# Patient Record
Sex: Male | Born: 1955 | Race: White | Hispanic: No | Marital: Married | Smoking: Former smoker
Health system: Southern US, Community
[De-identification: ages and names within clinical notes are randomized; demographics above are authoritative.]

## PROBLEM LIST (undated history)

## (undated) DIAGNOSIS — R0600 Dyspnea, unspecified: Secondary | ICD-10-CM

## (undated) DIAGNOSIS — C801 Malignant (primary) neoplasm, unspecified: Secondary | ICD-10-CM

## (undated) DIAGNOSIS — J449 Chronic obstructive pulmonary disease, unspecified: Secondary | ICD-10-CM

## (undated) MED FILL — Dexamethasone Sodium Phosphate Inj 100 MG/10ML: INTRAMUSCULAR | Qty: 1 | Status: AC

## (undated) MED FILL — Fosaprepitant Dimeglumine For IV Infusion 150 MG (Base Eq): INTRAVENOUS | Qty: 5 | Status: AC

---

## 1997-08-20 ENCOUNTER — Emergency Department (HOSPITAL_COMMUNITY): Admission: EM | Admit: 1997-08-20 | Discharge: 1997-08-20 | Payer: Self-pay | Admitting: Emergency Medicine

## 2007-05-08 ENCOUNTER — Emergency Department: Payer: Self-pay | Admitting: Emergency Medicine

## 2007-07-10 ENCOUNTER — Emergency Department: Payer: Self-pay

## 2007-09-05 ENCOUNTER — Other Ambulatory Visit: Payer: Self-pay

## 2007-09-06 ENCOUNTER — Observation Stay: Payer: Self-pay | Admitting: Internal Medicine

## 2011-01-21 ENCOUNTER — Emergency Department: Payer: Self-pay | Admitting: Unknown Physician Specialty

## 2011-01-21 LAB — URINALYSIS, COMPLETE
Bacteria: NONE SEEN
Bilirubin,UR: NEGATIVE
Ketone: NEGATIVE
Ph: 5 (ref 4.5–8.0)
RBC,UR: <1 /HPF (ref 0–5)
Squamous Epithelial: NONE SEEN

## 2011-01-21 LAB — COMPREHENSIVE METABOLIC PANEL
Albumin: 3.8 g/dL (ref 3.4–5.0)
BUN: 15 mg/dL (ref 7–18)
Chloride: 105 mmol/L (ref 98–107)
Co2: 23 mmol/L (ref 21–32)
EGFR (Non-African Amer.): >60
Potassium: 3.8 mmol/L (ref 3.5–5.1)
SGPT (ALT): 29 U/L
Total Protein: 7.4 g/dL (ref 6.4–8.2)

## 2011-01-21 LAB — CBC
MCH: 31.9 pg (ref 26.0–34.0)
MCHC: 34 g/dL (ref 32.0–36.0)
MCV: 94 fL (ref 80–100)
Platelet: 172 10*3/uL (ref 150–440)
RBC: 5.18 10*6/uL (ref 4.40–5.90)
WBC: 11.4 10*3/uL — ABNORMAL HIGH (ref 3.8–10.6)

## 2011-08-26 ENCOUNTER — Emergency Department (HOSPITAL_COMMUNITY)
Admission: EM | Admit: 2011-08-26 | Discharge: 2011-08-27 | Disposition: A | Discharge status: Home / Self Care | Payer: Self-pay | Attending: Emergency Medicine | Admitting: Emergency Medicine

## 2011-08-26 ENCOUNTER — Emergency Department (HOSPITAL_COMMUNITY): Payer: Self-pay

## 2011-08-26 DIAGNOSIS — R05 Cough: Secondary | ICD-10-CM | POA: Insufficient documentation

## 2011-08-26 DIAGNOSIS — R079 Chest pain, unspecified: Secondary | ICD-10-CM | POA: Insufficient documentation

## 2011-08-26 DIAGNOSIS — F17200 Nicotine dependence, unspecified, uncomplicated: Secondary | ICD-10-CM | POA: Insufficient documentation

## 2011-08-26 DIAGNOSIS — R0602 Shortness of breath: Secondary | ICD-10-CM | POA: Insufficient documentation

## 2011-08-26 DIAGNOSIS — R062 Wheezing: Secondary | ICD-10-CM | POA: Insufficient documentation

## 2011-08-26 DIAGNOSIS — R059 Cough, unspecified: Secondary | ICD-10-CM | POA: Insufficient documentation

## 2011-08-26 LAB — COMPREHENSIVE METABOLIC PANEL
Alkaline Phosphatase: 55 U/L (ref 39–117)
BUN: 13 mg/dL (ref 6–23)
Creatinine, Ser: 0.97 mg/dL (ref 0.50–1.35)
GFR calc Af Amer: >90 mL/min (ref >90)
Glucose, Bld: 103 mg/dL — ABNORMAL HIGH (ref 70–99)
Potassium: 3.5 mEq/L (ref 3.5–5.1)
Total Bilirubin: 0.7 mg/dL (ref 0.3–1.2)
Total Protein: 6.9 g/dL (ref 6.0–8.3)

## 2011-08-26 LAB — APTT: aPTT: 32 seconds (ref 24–37)

## 2011-08-26 LAB — POCT I-STAT TROPONIN I
Troponin i, poc: 0 ng/mL (ref 0.00–0.08)
Troponin i, poc: 0.01 ng/mL (ref 0.00–0.08)

## 2011-08-26 LAB — CBC
Hemoglobin: 16.1 g/dL (ref 13.0–17.0)
MCH: 32.3 pg (ref 26.0–34.0)
MCHC: 35.9 g/dL (ref 30.0–36.0)
RDW: 12.4 % (ref 11.5–15.5)

## 2011-08-26 MED ORDER — MORPHINE SULFATE 4 MG/ML IJ SOLN
4.0000 mg | Freq: Once | INTRAMUSCULAR | Status: AC
  Administered 2011-08-26: 4 mg via INTRAVENOUS
  Filled 2011-08-26: qty 1

## 2011-08-26 MED ORDER — METOPROLOL TARTRATE 25 MG PO TABS: 50.0000 mg | ORAL_TABLET | Freq: Once | ORAL | Status: DC

## 2011-08-26 MED ORDER — NITROGLYCERIN 0.4 MG SL SUBL
0.4000 mg | SUBLINGUAL_TABLET | SUBLINGUAL | Status: DC | PRN
  Administered 2011-08-26: 0.4 mg via SUBLINGUAL
  Filled 2011-08-26: qty 25

## 2011-08-26 MED ORDER — ASPIRIN 81 MG PO CHEW: 324.0000 mg | CHEWABLE_TABLET | Freq: Once | ORAL | Status: DC

## 2011-08-26 MED ORDER — NICOTINE 21 MG/24HR TD PT24: 21.0000 mg | MEDICATED_PATCH | Freq: Once | TRANSDERMAL | Status: AC | PRN

## 2011-08-26 MED ORDER — IPRATROPIUM BROMIDE 0.02 % IN SOLN
0.5000 mg | Freq: Once | RESPIRATORY_TRACT | Status: AC
  Administered 2011-08-26: 0.5 mg via RESPIRATORY_TRACT
  Filled 2011-08-26: qty 2.5

## 2011-08-26 MED ORDER — SODIUM CHLORIDE 0.9 % IV SOLN
1000.0000 mL | INTRAVENOUS | Status: DC
  Administered 2011-08-26: 1000 mL via INTRAVENOUS

## 2011-08-26 MED ORDER — ALBUTEROL SULFATE (5 MG/ML) 0.5% IN NEBU
5.0000 mg | INHALATION_SOLUTION | Freq: Once | RESPIRATORY_TRACT | Status: AC
  Administered 2011-08-26: 5 mg via RESPIRATORY_TRACT
  Filled 2011-08-26: qty 1

## 2011-08-26 NOTE — ED Notes (Signed)
The patient's wife asked [place] to to the patient to call home when he wakes up.

## 2011-08-26 NOTE — ED Notes (Addendum)
Pt complaint of substernal chest pain. Denies n/v/d, diaphoresis, shortness of breath. Had stress test roughly 5 years ago - showed moderate blockage. Pt told to have a repeat a few years later - did not have repeat stress test. Pain is short-lived, intermittent. Pain 9/10, sharp. Denies radiation. Pack a day smoker.  Pt given 324 ASA and 1 NTG by EMS. No change in chest pain after meds.

## 2011-08-26 NOTE — ED Provider Notes (Signed)
History     CSN: [id number]  Arrival date & time 08/26/11  1606   None     No chief complaint on file.   (Consider location/radiation/quality/duration/timing/severity/associated sxs/prior treatment) HPI This is a 56 year old man heavy cigarette smoker, without significant past medical history who presents with left chest pain which started about 5 hours ago. The pain is started when the patient was standing without physical activity. The chest pain is sharp and intermittent every after a few minutes and lasting about a few seconds. It does not radiate to his left upper, or jaw.  There is associated shortness of breath without palpitations. He has had similar chest pains in the past for the last 3 or 4 years. He has a long history of a cough, without sputum production. However, he denies fever or chills with this cough. The cough is associated with wheezing. He reports some allergies to molds which cause him to cough more. A witnessed two episodes of chest pain, which happened when I was interviewing him. He clenched his chest in pain for about 1 minute.  The patient has been evaluated in the past with a left cardiac catheterization which revealed some blockades in his coronaries as per his wife. He was told by the doctors that he did not require stenting of his coronaries. He is not taking regular medication for this chest pain except Tylenol for headaches.  He smokes about 3 packs per day since he was 14.      No past medical history on file.  No past surgical history on file.  No family history on file.  History  Substance Use Topics  . Smoking status: Not on file  . Smokeless tobacco: Not on file  . Alcohol Use: Not on file      Review of Systems  Constitutional: Negative.   Respiratory: Positive for cough, shortness of breath and wheezing. Negative for apnea and stridor.   Cardiovascular: Negative for leg swelling.  Gastrointestinal: Negative for nausea, vomiting and  abdominal pain.  Genitourinary: Negative.   Musculoskeletal: Negative.   Neurological: Negative for dizziness, tremors, seizures, syncope, facial asymmetry, speech difficulty, weakness, light-headedness, numbness and headaches.  Hematological: Negative.   Psychiatric/Behavioral: Negative.     Allergies  Review of patient's allergies indicates not on file.  Home Medications  No current outpatient prescriptions on file.  There were no vitals taken for this visit.  Physical Exam  Constitutional: He is oriented to person, place, and time. He appears well-developed and well-nourished. He appears distressed.  HENT:  Head: Normocephalic and atraumatic.  Eyes: Conjunctivae and EOM are normal. Pupils are equal, round, and reactive to light.  Neck: Normal range of motion. Neck supple.  Cardiovascular: Normal rate, regular rhythm and normal heart sounds.   Pulmonary/Chest: He is in respiratory distress. He has no decreased breath sounds. He has wheezes. He has rales. He exhibits no tenderness.  Abdominal: Soft. Bowel sounds are normal.  Musculoskeletal: Normal range of motion.  Neurological: He is alert and oriented to person, place, and time. He has normal reflexes.  Skin: Skin is warm and dry. He is not diaphoretic.  Psychiatric: He has a normal mood and affect.    ED Course  Procedures (including critical care time)  Labs Reviewed - No data to display No results found.   Date: 08/26/2011  Rate: 68 irregular  Rhythm: normal sinus rhythm  QRS Axis: normal  Intervals: normal  ST/T Wave abnormalities: normal  Conduction Disutrbances:none  Narrative Interpretation:  Old EKG Reviewed: none available     MDM  This is a 56 year old man with left sided chest pain that is sharp in nature and started 6 hours ago. Patient has a history of heavy cigarette smoking. He does not have of the significant past medical history and not regular medications. He also has a cough, and wheezing.  Examination reveals bilateral, generalized wheezing. EKG does not show any acute ischemic changes. Chest x-ray is normal. Patient has been given nitroglycerin and morphine. Still awaiting other tests including troponins, CBC, basic metabolic panel.  Patient with left chest pain, being ruled out for a cardiac cause. EKG is negative, the first troponin test is negative and chest x-ray is normal. I have discussed with CDU PA and he will be transferred to the CDU. A coronary CT scan is done to be done tomorrow.        Dow Adolph, MD 08/26/11 [phone]

## 2011-08-26 NOTE — ED Notes (Signed)
NP at bedside.

## 2011-08-26 NOTE — ED Notes (Addendum)
Per Felicie Morn, NP, the patient was advised that a nicotine patch is available for him, however the patient does not want it at this time.

## 2011-08-26 NOTE — ED Provider Notes (Signed)
I have supervised the resident on the management of this patient and agree with the note above. I personally interviewed and examined the patient and my addendum is below.   Maurice Simpson is a 56 y.o. male heavy smoker who doesn't see doctors here with CP. CP today, substernal, lasting several seconds. No nausea and no cough.   Exam showed nl cardiac exam, lung exam showed diffuse wheezing, abdomen nontender and no lower extremity edema. EKG shows NSR without any acute ST T wave changes. His CBC, CMP, and trop x 1 is negative.   Patient's history is concerning for ACS. Other than smoking, he is otherwise low risk. We ordered coronary CT and transferred him to CDU for serial troponins. Care transferred to CDU PA.    Richardean Canal, MD 08/26/11 [phone]

## 2011-08-26 NOTE — ED Provider Notes (Signed)
Patient placed in CDU under chest pain protocol.  Patient reports several episodes of left anterior chest pain, stabbing in nature, with varying intensity and duration.  No associated shortness of breath, nausea, vomiting, or diarrhea.  Patient currently pain-free, resting comfortably.  No evidence of ischemia on 12 lead.  Cardiac markers negative x 2.  CXR unremarkable.  Bilateral expiratory wheezing noted upon auscultation.  Patient is a 2 ppd smoker.  No other identified cardiac risk factors.   S1/S2, RRR, no murmur.  Abdomen soft, bowel sounds present.  Strong distal pulses palpated all extremities.  Patient scheduled for coronary CT in AM.  Diagnostic and treatment plan discussed with patient.  Jimmye Norman, NP 08/27/11 0003

## 2011-08-27 ENCOUNTER — Emergency Department (HOSPITAL_COMMUNITY): Payer: Self-pay

## 2011-08-27 ENCOUNTER — Encounter (HOSPITAL_COMMUNITY): Payer: Self-pay | Admitting: Radiology

## 2011-08-27 LAB — POCT I-STAT TROPONIN I

## 2011-08-27 MED ORDER — NITROGLYCERIN 0.4 MG SL SUBL
0.4000 mg | SUBLINGUAL_TABLET | Freq: Once | SUBLINGUAL | Status: AC
  Administered 2011-08-27: 0.4 mg via SUBLINGUAL

## 2011-08-27 MED ORDER — METOPROLOL TARTRATE 1 MG/ML IV SOLN
5.0000 mg | Freq: Once | INTRAVENOUS | Status: AC
  Administered 2011-08-27: 5 mg via INTRAVENOUS

## 2011-08-27 MED ORDER — IOHEXOL 350 MG/ML SOLN
80.0000 mL | Freq: Once | INTRAVENOUS | Status: AC | PRN
  Administered 2011-08-27: 80 mL via INTRAVENOUS

## 2011-08-27 MED ORDER — METOPROLOL TARTRATE 1 MG/ML IV SOLN
INTRAVENOUS | Status: AC
  Administered 2011-08-27: 5 mg via INTRAVENOUS
  Filled 2011-08-27: qty 10

## 2011-08-27 NOTE — ED Notes (Signed)
Family at bedside. 

## 2011-08-27 NOTE — ED Provider Notes (Signed)
Medical screening examination/treatment/procedure(s) were performed by non-physician practitioner and as supervising physician I was immediately available for consultation/collaboration.   Dione Booze, MD 08/27/11 [phone]

## 2011-08-27 NOTE — ED Provider Notes (Signed)
Medical screening examination/treatment/procedure(s) were performed by non-physician practitioner and as supervising physician I was immediately available for consultation/collaboration.   Richardean Canal, MD 08/27/11 1052

## 2011-08-27 NOTE — ED Provider Notes (Signed)
Monitor of pt care in CDU.  Pt had coronary CT this AM which shows atherosclerotic coronary disease of the L main coronary artery without luminal stenosis.  Has calcium score of 85, which is 64% percentile.  No other acute finding.  Pt has been chest pain free.  Will give referral to PCP and will also include cardiology referral as well.  Smoking cessation discussed.  Pt stable to be discharge.  Recommend taking baby ASA daily   Fayrene Helper, PA-C 08/27/11 1044

## 2011-08-27 NOTE — ED Notes (Signed)
Pt. Went to restroom hook back on monitor.

## 2011-08-27 NOTE — ED Notes (Signed)
Spoke with dr Amil Amen in radiology. He advises will be about 15 min before pt study resulted

## 2014-08-16 ENCOUNTER — Emergency Department
Admission: EM | Admit: 2014-08-16 | Discharge: 2014-08-16 | Disposition: A | Discharge status: Home / Self Care | Payer: Self-pay | Attending: Emergency Medicine | Admitting: Emergency Medicine

## 2014-08-16 ENCOUNTER — Encounter: Payer: Self-pay | Admitting: Emergency Medicine

## 2014-08-16 DIAGNOSIS — Z91038 Other insect allergy status: Secondary | ICD-10-CM

## 2014-08-16 DIAGNOSIS — T63444A Toxic effect of venom of bees, undetermined, initial encounter: Secondary | ICD-10-CM

## 2014-08-16 DIAGNOSIS — Y998 Other external cause status: Secondary | ICD-10-CM | POA: Insufficient documentation

## 2014-08-16 DIAGNOSIS — T63441A Toxic effect of venom of bees, accidental (unintentional), initial encounter: Secondary | ICD-10-CM | POA: Insufficient documentation

## 2014-08-16 DIAGNOSIS — Y9289 Other specified places as the place of occurrence of the external cause: Secondary | ICD-10-CM | POA: Insufficient documentation

## 2014-08-16 DIAGNOSIS — Y9389 Activity, other specified: Secondary | ICD-10-CM | POA: Insufficient documentation

## 2014-08-16 DIAGNOSIS — Z720 Tobacco use: Secondary | ICD-10-CM | POA: Insufficient documentation

## 2014-08-16 DIAGNOSIS — Z91030 Bee allergy status: Secondary | ICD-10-CM

## 2014-08-16 MED ORDER — IPRATROPIUM-ALBUTEROL 0.5-2.5 (3) MG/3ML IN SOLN
3.0000 mL | Freq: Once | RESPIRATORY_TRACT | Status: AC
  Administered 2014-08-16: 3 mL via RESPIRATORY_TRACT
  Filled 2014-08-16: qty 3

## 2014-08-16 MED ORDER — DEXAMETHASONE SODIUM PHOSPHATE 10 MG/ML IJ SOLN
INTRAMUSCULAR | Status: AC
Start: 2014-08-16 — End: 2014-08-16
  Administered 2014-08-16: 10 mg via INTRAVENOUS
  Filled 2014-08-16: qty 1

## 2014-08-16 MED ORDER — SODIUM CHLORIDE 0.9 % IV BOLUS (SEPSIS)
1000.0000 mL | Freq: Once | INTRAVENOUS | Status: AC
  Administered 2014-08-16: 1000 mL via INTRAVENOUS

## 2014-08-16 MED ORDER — DEXAMETHASONE SODIUM PHOSPHATE 10 MG/ML IJ SOLN
10.0000 mg | Freq: Once | INTRAMUSCULAR | Status: AC
  Administered 2014-08-16: 10 mg via INTRAVENOUS

## 2014-08-16 MED ORDER — DIPHENHYDRAMINE HCL 50 MG/ML IJ SOLN
25.0000 mg | Freq: Once | INTRAMUSCULAR | Status: AC
  Administered 2014-08-16: 25 mg via INTRAVENOUS

## 2014-08-16 MED ORDER — PREDNISONE 10 MG PO TABS: ORAL_TABLET | ORAL | Status: DC

## 2014-08-16 MED ORDER — DIPHENHYDRAMINE HCL 50 MG/ML IJ SOLN
INTRAMUSCULAR | Status: AC
  Administered 2014-08-16: 25 mg via INTRAVENOUS
  Filled 2014-08-16: qty 1

## 2014-08-16 NOTE — ED Notes (Signed)
States he was stung near right eye   Eye red and swollen

## 2014-08-16 NOTE — Discharge Instructions (Signed)
CARRY EPI-PEN WITH YOU TO WORK. CONTINUE BENADRYL EVERY 6 HOURS FOR ITCHING    1-2 TABLETS PREDNISONE BEGIN TOMORROW  RETURN TO ER IMMEDIATELY  IF ANY PROBLEMS

## 2014-08-16 NOTE — ED Notes (Signed)
Pt states he feels better.

## 2014-08-16 NOTE — ED Provider Notes (Signed)
[facility] Emergency Department Provider Note  ____________________________________________  Time seen: 4:00 PM  I have reviewed the triage vital signs and the nursing notes.   HISTORY  Chief Complaint Insect Bite   HPI Maurice Simpson is a 59 y.o. male is here after being stung by bee. He states that this occurredshortly after 3 PM. Patient states he's had a history of problems with bee stings. He states that he "came close to the end of his life" after a bee sting in the past. Patient states this was a "ground bee".  He has not taken any medication for this since he is staying and does not have an EpiPen with him. Currently he denies any difficulty breathing but states it "feels tight". Bee sting was near his right eye and area has swollen. He denies any visual changes at this time.  Patient is also a 2 pack-a-day smoker. He states he is also had 2 breathing treatments in the past due to his smoking and emphysema   No past medical history on file.  There are no active problems to display for this patient.   History reviewed. No pertinent past surgical history.  Current Outpatient Rx  Name  Route  Sig  Dispense  Refill  . predniSONE (DELTASONE) 10 MG tablet      Take 3 tablets q d x 3 days starting saturday   9 tablet   0     Allergies Review of patient's allergies indicates no known allergies.  No family history on file.  Social History History  Substance Use Topics  . Smoking status: Current Every Day Smoker  . Smokeless tobacco: Not on file  . Alcohol Use: No    Review of Systems Constitutional: No fever/chills Eyes: No visual changes. ENT: No sore throat. Cardiovascular: Denies chest pain. Respiratory: Denies shortness of breath. Gastrointestinal: No abdominal pain.  No nausea, no vomiting. Genitourinary: Negative for dysuria. Musculoskeletal: Negative for back pain. Skin: Negative for rash. Erythematous area right cheek below the  eye. Neurological: Negative for headaches, focal weakness or numbness.  10-point ROS otherwise negative.  ____________________________________________   PHYSICAL EXAM:  VITAL SIGNS: ED Triage Vitals  Enc Vitals Group     BP 08/16/14 1532 126/78 mmHg     Pulse Rate 08/16/14 1530 89     Resp 08/16/14 1530 20     Temp 08/16/14 1530 97.8 F (36.6 C)     Temp Source 08/16/14 1530 Oral     SpO2 08/16/14 1530 100 %     Weight 08/16/14 1530 210 lb (95.255 kg)     Height 08/16/14 1530 '5\' 8"'$  (1.727 m)     Head Cir --      Peak Flow --      Pain Score --      Pain Loc --      Pain Edu? --      Excl. in [place]? --     Constitutional: Alert and oriented. Well appearing and in no acute distress. Patient is able to talk in complete sentences at this time without any difficulty. Eyes: Conjunctivae are normal. PERRL. EOMI. Head: Atraumatic. Nose: No congestion/rhinnorhea. Mouth/Throat: Mucous membranes are moist.  Oropharynx non-erythematous. No edema is noted. Neck: No stridor.  Supple Cardiovascular: Normal rate, regular rhythm. Grossly normal heart sounds.  Good peripheral circulation. Respiratory: Normal respiratory effort.  No retractions. Lungs with faint bilateral expiratory wheezes. Patient is able to speak in complete sentences. Patient denies any respiratory difficulty and  feels this may be related to his smoking. Gastrointestinal: Soft and nontender. No distention. No abdominal bruits. No CVA tenderness. Musculoskeletal: No lower extremity tenderness nor edema.  No joint effusions. Neurologic:  Normal speech and language. No gross focal neurologic deficits are appreciated. No gait instability. Skin:  Skin is warm, dry and intact. Erythematous area right cheek with skin tissue minimal edema Psychiatric: Mood and affect are normal. Speech and behavior are normal.  ____________________________________________   LABS (all labs ordered are listed, but only abnormal results are  displayed)  Labs Reviewed - No data to display  PROCEDURES  Procedure(s) performed: None  Critical Care performed: No  ____________________________________________   INITIAL IMPRESSION / ASSESSMENT AND PLAN / ED COURSE  Pertinent labs & imaging results that were available during my care of the patient were reviewed by me and considered in my medical decision making (see chart for details).  ----------------------------------------- 4:58 PM on 08/16/2014      patient continues to be able to talk in full sentences without any respiratory distress. Patient was able to ambulate to the restroom without any recurrence assistance after nebulizer treatment. Patient denies any respiratory problems. He states that he has multiple EpiPen at home and does not need a prescription for another one. He states he did not think that EpiPen's could be outside in the heat heat and does not take him to work with him. We discussed that having an EpiPen with him at work is better than not having one at all. Patient was discharged on prednisone to start Saturday, he is to continue Benadryl every 6 hours if needed for itching, and cold compresses to his face. He is return to the emergency room if any recurrent symptoms or urgent concerns. 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

## 2019-07-20 ENCOUNTER — Encounter: Payer: Self-pay | Admitting: Nurse Practitioner

## 2019-07-20 ENCOUNTER — Telehealth: Payer: Self-pay | Admitting: Nurse Practitioner

## 2019-07-20 ENCOUNTER — Other Ambulatory Visit: Payer: Self-pay

## 2019-07-20 ENCOUNTER — Ambulatory Visit (INDEPENDENT_AMBULATORY_CARE_PROVIDER_SITE_OTHER): Payer: Self-pay | Admitting: Nurse Practitioner

## 2019-07-20 VITALS — BP 137/73 | HR 74 | Temp 98.5°F | Ht 68.3 in | Wt 223.4 lb

## 2019-07-20 DIAGNOSIS — Z0000 Encounter for general adult medical examination without abnormal findings: Secondary | ICD-10-CM

## 2019-07-20 DIAGNOSIS — Z125 Encounter for screening for malignant neoplasm of prostate: Secondary | ICD-10-CM

## 2019-07-20 DIAGNOSIS — E6609 Other obesity due to excess calories: Secondary | ICD-10-CM

## 2019-07-20 DIAGNOSIS — Z800 Family history of malignant neoplasm of digestive organs: Secondary | ICD-10-CM

## 2019-07-20 DIAGNOSIS — J449 Chronic obstructive pulmonary disease, unspecified: Secondary | ICD-10-CM

## 2019-07-20 DIAGNOSIS — Z6833 Body mass index (BMI) 33.0-33.9, adult: Secondary | ICD-10-CM

## 2019-07-20 DIAGNOSIS — E669 Obesity, unspecified: Secondary | ICD-10-CM | POA: Insufficient documentation

## 2019-07-20 DIAGNOSIS — K5900 Constipation, unspecified: Secondary | ICD-10-CM

## 2019-07-20 DIAGNOSIS — F17210 Nicotine dependence, cigarettes, uncomplicated: Secondary | ICD-10-CM

## 2019-07-20 DIAGNOSIS — Z7689 Persons encountering health services in other specified circumstances: Secondary | ICD-10-CM

## 2019-07-20 MED ORDER — LINACLOTIDE 72 MCG PO CAPS: 72.0000 ug | ORAL_CAPSULE | Freq: Every day | ORAL | 2 refills | Status: DC

## 2019-07-20 NOTE — Progress Notes (Addendum)
New Patient Office Visit  Subjective:  Patient ID: Maurice Simpson, male    DOB: 06-Dec-1955  Age: 64 y.o. MRN: [id number]  CC:  Chief Complaint  Patient presents with  . Establish Care  . Constipation    pt states he has been constipated for the past 3 weeks. States he has taken multiple OTC laxatives with little relief. Last BM was yesterday, only a small amount.     HPI Maurice Simpson presents for new patient visit to establish care.  Introduced to Designer, jewellery role and practice setting.  All questions answered.  Discussed provider/patient relationship and expectations.  Has not seen primary care provider in a few years -- previously provider across street.  No recent preventative screening.  CONSTIPATION Has been present x 3 weeks.  Reports having some lower back pain with this.  Prior to 3 weeks ago, he had a BM every morning without straining.  Does not drink much water at home, gives him indigestion -- drinks mainly soda at home (Coke) or sweet tea.  This week has had 2-3 BM with straining -- when has BM it has been looser.    He is a current smoker, about 2 PPD.  Has smoked since he was 36 or 15.  He reports past provider told him he had some COPD, does not use inhalers.  Does have a family history of Colon CA in cousin at age 52 -- reports he had not gone for screening, started with symptoms similar to patient, and passed 9 weeks later.  Patient has not had a colonoscopy. Status: stable Treatments attempted: Laxative pill, stool softener, suppository Nausea: no Vomiting: no Weight loss: no Decreased appetite: no Diarrhea: no Constipation: yes Blood in stool: no Heartburn: occasional Jaundice: no Rash: no Dysuria/urinary frequency: no Hematuria: no History of sexually transmitted disease: no Recurrent NSAID use: no  History reviewed. No pertinent past medical history.  History reviewed. No pertinent surgical history.  Family History  Problem Relation Age of Onset    . Diabetes Mother   . Hypertension Mother   . Diabetes Father   . Hypertension Father   . Diabetes Sister     Social History   Socioeconomic History  . Marital status: Married    Spouse name: Not on file  . Number of children: Not on file  . Years of education: Not on file  . Highest education level: Not on file  Occupational History  . Not on file  Tobacco Use  . Smoking status: Current Every Day Smoker    Packs/day: 2.00  . Smokeless tobacco: Never Used  Vaping Use  . Vaping Use: Never used  Substance and Sexual Activity  . Alcohol use: Not Currently  . Drug use: Never  . Sexual activity: Yes  Other Topics Concern  . Not on file  Social History Narrative  . Not on file   Social Determinants of Health   Financial Resource Strain: Low Risk   . Difficulty of Paying Living Expenses: Not hard at all  Food Insecurity: No Food Insecurity  . Worried About Charity fundraiser in the Last Year: Never true  . Ran Out of Food in the Last Year: Never true  Transportation Needs: No Transportation Needs  . Lack of Transportation (Medical): No  . Lack of Transportation (Non-Medical): No  Physical Activity: Inactive  . Days of Exercise per Week: 0 days  . Minutes of Exercise per Session: 0 min  Stress: Stress Concern Present  .  Feeling of Stress : To some extent  Social Connections: Moderately Integrated  . Frequency of Communication with Friends and Family: Three times a week  . Frequency of Social Gatherings with Friends and Family: Three times a week  . Attends Religious Services: 1 to 4 times per year  . Active Member of Clubs or Organizations: No  . Attends Archivist Meetings: Never  . Marital Status: Married  Human resources officer Violence:   . Fear of Current or Ex-Partner:   . Emotionally Abused:   Marland Kitchen Physically Abused:   . Sexually Abused:     ROS Review of Systems  Constitutional: Negative for activity change, appetite change, fatigue, fever and  unexpected weight change.  Respiratory: Positive for shortness of breath (at baseline) and wheezing (at baseline). Negative for cough and chest tightness.   Cardiovascular: Negative for chest pain, palpitations and leg swelling.  Gastrointestinal: Positive for constipation. Negative for abdominal distention, abdominal pain, blood in stool, diarrhea, nausea, rectal pain and vomiting.  Endocrine: Negative for cold intolerance, heat intolerance, polydipsia, polyphagia and polyuria.  Genitourinary: Negative for dysuria, frequency, hematuria and urgency.  Musculoskeletal: Positive for back pain.  Neurological: Negative.   Psychiatric/Behavioral: Negative.     Objective:   Today's Vitals: BP 137/73   Pulse 74   Temp 98.5 F (36.9 C) (Oral)   Ht 5' 8.3" (1.735 m)   Wt 223 lb 6.4 oz (101.3 kg)   SpO2 95%   BMI 33.67 kg/m   Physical Exam Vitals and nursing note reviewed.  Constitutional:      General: He is awake. He is not in acute distress.    Appearance: He is well-developed and well-groomed. He is obese. He is not ill-appearing.  HENT:     Head: Normocephalic and atraumatic.     Right Ear: Hearing normal. No drainage.     Left Ear: Hearing normal. No drainage.  Eyes:     General: Lids are normal.        Right eye: No discharge.        Left eye: No discharge.     Conjunctiva/sclera: Conjunctivae normal.     Pupils: Pupils are equal, round, and reactive to light.  Neck:     Thyroid: No thyromegaly.     Vascular: No carotid bruit.     Trachea: Trachea normal.  Cardiovascular:     Rate and Rhythm: Normal rate and regular rhythm.     Heart sounds: Normal heart sounds, S1 normal and S2 normal. No murmur heard.  No gallop.   Pulmonary:     Effort: Pulmonary effort is normal. No accessory muscle usage or respiratory distress.     Breath sounds: Wheezing present.     Comments: Expiratory wheezes noted throughout lung fields.  No SOB with talking. Abdominal:     General: Bowel  sounds are normal. There is no distension (soft abdomen).     Palpations: Abdomen is soft. There is no hepatomegaly or mass.     Tenderness: There is no abdominal tenderness. There is no right CVA tenderness or left CVA tenderness.     Hernia: No hernia is present.     Comments: Normal bowel sounds and normal percussion.  Musculoskeletal:        General: Normal range of motion.     Cervical back: Normal range of motion and neck supple.     Right lower leg: No edema.     Left lower leg: No edema.  Lymphadenopathy:  Cervical: No cervical adenopathy.  Skin:    General: Skin is warm and dry.     Capillary Refill: Capillary refill takes less than 2 seconds.     Findings: No rash.  Neurological:     Mental Status: He is alert and oriented to person, place, and time.     Deep Tendon Reflexes: Reflexes are normal and symmetric.  Psychiatric:        Mood and Affect: Mood normal.        Behavior: Behavior normal. Behavior is cooperative.        Thought Content: Thought content normal.        Judgment: Judgment normal.     Assessment & Plan:   Problem List Items Addressed This Visit      Respiratory   COPD (chronic obstructive pulmonary disease) ([place])    Patient reported, was told this in past -- suspect present due to 2 PPD smoking since age 43.  Would benefit from inhalers, but reports in past could not afford these.  Consider CCM referral to assist, as would benefit from LABA/LAMA and SABA.  Spirometry next visit.          Other   Obesity    BMI 33.67.  Recommended eating smaller high protein, low fat meals more frequently and exercising 30 mins a day 5 times a week with a goal of 10-15lb weight loss in the next 3 months. Patient voiced their understanding and motivation to adhere to these recommendations.       Nicotine dependence, cigarettes, uncomplicated    I have recommended complete cessation of tobacco use. I have discussed various options available for assistance with  tobacco cessation including over the counter methods (Nicotine gum, patch and lozenges). We also discussed prescription options (Chantix, Nicotine Inhaler / Nasal Spray). The patient is not interested in pursuing any prescription tobacco cessation options at this time.  Spirometry next visit and will discuss lung screening, would benefit from this.       Family history of colon cancer    Urgent referral to GI due to current constipation x 3 weeks and risk factors for colon CA, including family history, obesity, and 2 PPD smoker.      Relevant Orders   Ambulatory referral to Gastroenterology   Constipation    Ongoing x 3 weeks in 2 PPD smoker with family history, cousin, of colon CA.  Will obtain XR imaging to start, abdominal views, he is self pay -- if abnormal will consider CT scan.  Labs today CBC, CMP, TSH, PSA.  Recommend decrease soda intake and increase water intake at home.  Take daily Miralax + ensure fiber rich diet.  Will trial Linzess 72 MCG daily, script sent in.  Urgent referral to GI placed due to risk factors and current symptoms. No red flag symptoms -- no abdominal pain, blood in stool, N&V, weight loss, fever.  Have highly recommend he obtain colonoscopy and discussed with him that GI will schedule this.  He agrees with plan of care.  Follow-up in 2 weeks.      Relevant Orders   DG Abd 2 Views   CBC with Differential/Platelet   Comprehensive metabolic panel   TSH   Ambulatory referral to Gastroenterology   Preventative health care    None recent.  Needs: - Tetanus up date - Colonoscopy - Covid vaccine - Shingles vaccine - PPSV23 -- smoker - HIV and Hep C screening - Lung CT CA screening  Other Visit Diagnoses    Encounter to establish care    -  Primary   Prostate cancer screening       PSA on labs today.   Relevant Orders   PSA      Outpatient Encounter Medications as of 07/20/2019  Medication Sig  . linaclotide (LINZESS) 72 MCG capsule Take 1 capsule  (72 mcg total) by mouth daily before breakfast.  . [DISCONTINUED] predniSONE (DELTASONE) 10 MG tablet Take 3 tablets q d x 3 days starting saturday   No facility-administered encounter medications on file as of 07/20/2019.    Follow-up: Return in about 2 weeks (around 08/03/2019) for Constipation.   [name], NP

## 2019-07-20 NOTE — Telephone Encounter (Signed)
Patient states linaclotide (LINZESS) 72 MCG capsule even with the $100 discount, medication is expensive. Requesting alternate sent in today. Spouse would like a follow up call when completed  [id number] - [place], [place] - [address] Phone:  [phone]  Fax:  [phone]

## 2019-07-20 NOTE — Assessment & Plan Note (Signed)
None recent.  Needs: - Tetanus up date - Colonoscopy - Covid vaccine - Shingles vaccine - PPSV23 -- smoker - HIV and Hep C screening - Lung CT CA screening

## 2019-07-20 NOTE — Assessment & Plan Note (Signed)
Patient reported, was told this in past -- suspect present due to 2 PPD smoking since age 64.  Would benefit from inhalers, but reports in past could not afford these.  Consider CCM referral to assist, as would benefit from LABA/LAMA and SABA.  Spirometry next visit.

## 2019-07-20 NOTE — Assessment & Plan Note (Signed)
BMI 33.67.  Recommended eating smaller high protein, low fat meals more frequently and exercising 30 mins a day 5 times a week with a goal of 10-15lb weight loss in the next 3 months. Patient voiced their understanding and motivation to adhere to these recommendations.

## 2019-07-20 NOTE — Assessment & Plan Note (Signed)
I have recommended complete cessation of tobacco use. I have discussed various options available for assistance with tobacco cessation including over the counter methods (Nicotine gum, patch and lozenges). We also discussed prescription options (Chantix, Nicotine Inhaler / Nasal Spray). The patient is not interested in pursuing any prescription tobacco cessation options at this time.  Spirometry next visit and will discuss lung screening, would benefit from this.

## 2019-07-20 NOTE — Telephone Encounter (Signed)
Spoke to patient and wife via telephone, discussed that all prescription medications would be costly out of pocket and they are self pay.  Recommend to start taking Miralax daily in morning + taking Senokot 1 tablet by mouth in morning and one in evening at this time, also to go for imaging at [facility] location -- address placed on wrap up sheet.  GI referral in place, they have not heard from them as of yet.  This will be beneficial for further work-up.  For worsening symptoms immediately go to ER.

## 2019-07-20 NOTE — Assessment & Plan Note (Signed)
Urgent referral to GI due to current constipation x 3 weeks and risk factors for colon CA, including family history, obesity, and 2 PPD smoker.

## 2019-07-20 NOTE — Patient Instructions (Addendum)
IMAGING ----- [place],  [address]  Constipation, Adult Constipation is when a person:  Poops (has a bowel movement) fewer times in a week than normal.  Has a hard time pooping.  Has poop that is dry, hard, or bigger than normal. Follow these instructions at home: Eating and drinking   Eat foods that have a lot of fiber, such as: ? Fresh fruits and vegetables. ? Whole grains. ? Beans.  Eat less of foods that are high in fat, low in fiber, or overly processed, such as: ? [place] fries. ? Hamburgers. ? Cookies. ? Candy. ? Soda.  Drink enough fluid to keep your pee (urine) clear or pale yellow. General instructions  Exercise regularly or as told by your doctor.  Go to the restroom when you feel like you need to poop. Do not hold it in.  Take over-the-counter and prescription medicines only as told by your doctor. These include any fiber supplements.  Do pelvic floor retraining exercises, such as: ? Doing deep breathing while relaxing your lower belly (abdomen). ? Relaxing your pelvic floor while pooping.  Watch your condition for any changes.  Keep all follow-up visits as told by your doctor. This is important. Contact a doctor if:  You have pain that gets worse.  You have a fever.  You have not pooped for 4 days.  You throw up (vomit).  You are not hungry.  You lose weight.  You are bleeding from the anus.  You have thin, pencil-like poop (stool). Get help right away if:  You have a fever, and your symptoms suddenly get worse.  You leak poop or have blood in your poop.  Your belly feels hard or bigger than normal (is bloated).  You have very bad belly pain.  You feel dizzy or you faint. This information is not intended to replace advice given to you by your health care provider. Make sure you discuss any questions you have with your health care provider. Document Revised: 12/10/2016 Document Reviewed: 06/18/2015 Elsevier Patient  Education  2020 [employer].

## 2019-07-20 NOTE — Assessment & Plan Note (Addendum)
Ongoing x 3 weeks in 2 PPD smoker with family history, cousin, of colon CA.  Will obtain XR imaging to start, abdominal views, he is self pay -- if abnormal will consider CT scan.  Labs today CBC, CMP, TSH, PSA.  Recommend decrease soda intake and increase water intake at home.  Take daily Miralax + ensure fiber rich diet.  Will trial Linzess 72 MCG daily, script sent in.  Urgent referral to GI placed due to risk factors and current symptoms. No red flag symptoms -- no abdominal pain, blood in stool, N&V, weight loss, fever.  Have highly recommend he obtain colonoscopy and discussed with him that GI will schedule this.  He agrees with plan of care.  Follow-up in 2 weeks.

## 2019-07-20 NOTE — Telephone Encounter (Signed)
Routing to provider  

## 2019-07-21 LAB — COMPREHENSIVE METABOLIC PANEL
ALT: 28 IU/L (ref 0–44)
AST: 27 IU/L (ref 0–40)
Albumin/Globulin Ratio: 1.7 (ref 1.2–2.2)
Albumin: 4.3 g/dL (ref 3.8–4.8)
Alkaline Phosphatase: 56 IU/L (ref 48–121)
BUN/Creatinine Ratio: 9 — ABNORMAL LOW (ref 10–24)
BUN: 8 mg/dL (ref 8–27)
Bilirubin Total: 0.9 mg/dL (ref 0.0–1.2)
CO2: 22 mmol/L (ref 20–29)
Calcium: 9.2 mg/dL (ref 8.6–10.2)
Chloride: 100 mmol/L (ref 96–106)
Creatinine, Ser: 0.89 mg/dL (ref 0.76–1.27)
GFR calc Af Amer: 104 mL/min/{1.73_m2} (ref >59)
GFR calc non Af Amer: 90 mL/min/{1.73_m2} (ref >59)
Globulin, Total: 2.6 g/dL (ref 1.5–4.5)
Glucose: 110 mg/dL — ABNORMAL HIGH (ref 65–99)
Potassium: 3.8 mmol/L (ref 3.5–5.2)
Sodium: 136 mmol/L (ref 134–144)
Total Protein: 6.9 g/dL (ref 6.0–8.5)

## 2019-07-21 LAB — CBC WITH DIFFERENTIAL/PLATELET
Basophils Absolute: 0.1 10*3/uL (ref 0.0–0.2)
Basos: 1 %
EOS (ABSOLUTE): 0.3 10*3/uL (ref 0.0–0.4)
Eos: 3 %
Hematocrit: 49.5 % (ref 37.5–51.0)
Hemoglobin: 17.1 g/dL (ref 13.0–17.7)
Immature Grans (Abs): 0 10*3/uL (ref 0.0–0.1)
Immature Granulocytes: 0 %
Lymphocytes Absolute: 3 10*3/uL (ref 0.7–3.1)
Lymphs: 37 %
MCH: 31.5 pg (ref 26.6–33.0)
MCHC: 34.5 g/dL (ref 31.5–35.7)
MCV: 91 fL (ref 79–97)
Monocytes Absolute: 0.4 10*3/uL (ref 0.1–0.9)
Monocytes: 6 %
Neutrophils Absolute: 4.2 10*3/uL (ref 1.4–7.0)
Neutrophils: 53 %
Platelets: 117 10*3/uL — ABNORMAL LOW (ref 150–450)
RBC: 5.42 x10E6/uL (ref 4.14–5.80)
RDW: 12.9 % (ref 11.6–15.4)
WBC: 8 10*3/uL (ref 3.4–10.8)

## 2019-07-21 LAB — PSA: Prostate Specific Ag, Serum: 0.5 ng/mL (ref 0.0–4.0)

## 2019-07-21 LAB — TSH: TSH: 2.5 u[IU]/mL (ref 0.450–4.500)

## 2019-07-22 NOTE — Progress Notes (Signed)
Please let Mr. Elsayed know his labs have returned: - Platelets were on mildly lower side, we will recheck these at next visit to see if ongoing or worsening present.  Will monitor closely.   - Kidney and liver function are normal on labs. - Thyroid and prostate labs are in normal range -- great news!! If any questions let me know, please ensure you get x-ray imaging and schedule with GI when they call.  It was very nice to meet you and will see you at next visit:)

## 2019-07-31 ENCOUNTER — Emergency Department
Admission: EM | Admit: 2019-07-31 | Discharge: 2019-07-31 | Disposition: A | Discharge status: Home / Self Care | Payer: Self-pay | Attending: Emergency Medicine | Admitting: Emergency Medicine

## 2019-07-31 ENCOUNTER — Encounter: Payer: Self-pay | Admitting: Emergency Medicine

## 2019-07-31 ENCOUNTER — Other Ambulatory Visit: Payer: Self-pay

## 2019-07-31 DIAGNOSIS — S60869A Insect bite (nonvenomous) of unspecified wrist, initial encounter: Secondary | ICD-10-CM

## 2019-07-31 DIAGNOSIS — F17210 Nicotine dependence, cigarettes, uncomplicated: Secondary | ICD-10-CM | POA: Insufficient documentation

## 2019-07-31 DIAGNOSIS — Y9389 Activity, other specified: Secondary | ICD-10-CM | POA: Insufficient documentation

## 2019-07-31 DIAGNOSIS — W57XXXA Bitten or stung by nonvenomous insect and other nonvenomous arthropods, initial encounter: Secondary | ICD-10-CM | POA: Insufficient documentation

## 2019-07-31 DIAGNOSIS — Y999 Unspecified external cause status: Secondary | ICD-10-CM | POA: Insufficient documentation

## 2019-07-31 DIAGNOSIS — J449 Chronic obstructive pulmonary disease, unspecified: Secondary | ICD-10-CM | POA: Insufficient documentation

## 2019-07-31 DIAGNOSIS — S60862A Insect bite (nonvenomous) of left wrist, initial encounter: Secondary | ICD-10-CM | POA: Insufficient documentation

## 2019-07-31 DIAGNOSIS — Y9289 Other specified places as the place of occurrence of the external cause: Secondary | ICD-10-CM | POA: Insufficient documentation

## 2019-07-31 MED ORDER — FAMOTIDINE 20 MG PO TABS
40.0000 mg | ORAL_TABLET | Freq: Once | ORAL | Status: AC
  Administered 2019-07-31: 40 mg via ORAL
  Filled 2019-07-31: qty 2

## 2019-07-31 MED ORDER — HYDROCODONE-ACETAMINOPHEN 5-325 MG PO TABS
1.0000 | ORAL_TABLET | Freq: Once | ORAL | Status: AC
  Administered 2019-07-31: 1 via ORAL
  Filled 2019-07-31: qty 1

## 2019-07-31 MED ORDER — PREDNISONE 20 MG PO TABS
60.0000 mg | ORAL_TABLET | Freq: Once | ORAL | Status: AC
  Administered 2019-07-31: 60 mg via ORAL
  Filled 2019-07-31: qty 3

## 2019-07-31 MED ORDER — PREDNISONE 50 MG PO TABS
ORAL_TABLET | ORAL | 0 refills | Status: DC
Start: 2019-07-31 — End: 2021-04-30

## 2019-07-31 MED ORDER — ONDANSETRON 4 MG PO TBDP
4.0000 mg | ORAL_TABLET | Freq: Once | ORAL | Status: DC
  Filled 2019-07-31: qty 1

## 2019-07-31 MED ORDER — EPINEPHRINE 0.3 MG/0.3ML IJ SOAJ: 0.3000 mg | INTRAMUSCULAR | 0 refills | Status: DC | PRN

## 2019-07-31 MED ORDER — FAMOTIDINE 20 MG PO TABS
20.0000 mg | ORAL_TABLET | Freq: Two times a day (BID) | ORAL | 1 refills | Status: DC
Start: 2019-07-31 — End: 2021-04-30

## 2019-07-31 NOTE — ED Provider Notes (Signed)
Emergency Department Provider Note  ____________________________________________  Time seen: Approximately 10:54 PM  I have reviewed the triage vital signs and the nursing notes.   HISTORY  Chief Complaint Insect Bite   Historian Patient     HPI Maurice Simpson is a 64 y.o. male presents to the emergency department after being stung by a bee.  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  Patient states that he has only had pain at sting site.  He denies shortness of breath, cough, chest tightness, diarrhea, vomiting or syncope.  Patient states that he has never been prescribed an EpiPen for his be allergy.  Patient took Benadryl prior to coming to the emergency department.   History reviewed. No pertinent past medical history.   Immunizations up to date:  Yes.     History reviewed. No pertinent past medical history.  Patient Active Problem List   Diagnosis Date Noted  . Obesity 07/20/2019  . Nicotine dependence, cigarettes, uncomplicated 30/16/0109  . Family history of colon cancer 07/20/2019  . Constipation 07/20/2019  . COPD (chronic obstructive pulmonary disease) ([place]) 07/20/2019  . Preventative health care 07/20/2019    History reviewed. No pertinent surgical history.  Prior to Admission medications   Medication Sig Start Date End Date Taking? Authorizing Provider  EPINEPHrine 0.3 mg/0.3 mL IJ SOAJ injection Inject 0.3 mLs (0.3 mg total) into the muscle as needed for anaphylaxis. 07/31/19   Lannie Fields, PA-C  famotidine (PEPCID) 20 MG tablet Take 1 tablet (20 mg total) by mouth 2 (two) times daily for 5 days. 07/31/19 08/05/19  Lannie Fields, PA-C  linaclotide [facility]) 72 MCG capsule Take 1 capsule (72 mcg total) by mouth daily before breakfast. 07/20/19   [name], [name], NP  predniSONE (DELTASONE) 50 MG tablet Take one tablet once daily for the next five days. 07/31/19   Lannie Fields, PA-C    Allergies Patient has no known allergies.  Family History   Problem Relation Age of Onset  . Diabetes Mother   . Hypertension Mother   . Diabetes Father   . Hypertension Father   . Diabetes Sister     Social History Social History   Tobacco Use  . Smoking status: Current Every Day Smoker    Packs/day: 2.00  . Smokeless tobacco: Never Used  Vaping Use  . Vaping Use: Never used  Substance Use Topics  . Alcohol use: Not Currently  . Drug use: Never     Review of Systems  Constitutional: No fever/chills Eyes:  No discharge ENT: No upper respiratory complaints. Respiratory: no cough. No SOB/ use of accessory muscles to breath Gastrointestinal:   No nausea, no vomiting.  No diarrhea.  No constipation. Musculoskeletal: Negative for musculoskeletal pain. Skin: Patient has bee sting.    ____________________________________________   PHYSICAL EXAM:  VITAL SIGNS: ED Triage Vitals  Enc Vitals Group     BP 07/31/19 2123 (!) 154/77     Pulse Rate 07/31/19 2123 80     Resp 07/31/19 2123 16     Temp 07/31/19 2123 98.7 F (37.1 C)     Temp Source 07/31/19 2123 Oral     SpO2 07/31/19 2123 96 %     Weight 07/31/19 2120 225 lb (102.1 kg)     Height 07/31/19 2120 5\' 8"  (1.727 m)     Head Circumference --      Peak Flow --      Pain Score 07/31/19 2120 8  Pain Loc --      Pain Edu? --      Excl. in [place]? --      Constitutional: Alert and oriented. Well appearing and in no acute distress. Eyes: Conjunctivae are normal. PERRL. EOMI. Head: Atraumatic. ENT:      Nose: No congestion/rhinnorhea.      Mouth/Throat: Mucous membranes are moist.  Neck: No stridor.  No cervical spine tenderness to palpation. Cardiovascular: Normal rate, regular rhythm. Normal S1 and S2.  Good peripheral circulation. Respiratory: Normal respiratory effort without tachypnea or retractions. Lungs CTAB. Good air entry to the bases with no decreased or absent breath sounds Gastrointestinal: Bowel sounds x 4 quadrants. Soft and nontender to palpation. No  guarding or rigidity. No distention. Musculoskeletal: Full range of motion to all extremities. No obvious deformities noted Neurologic:  Normal for age. No gross focal neurologic deficits are appreciated.  Skin: Patient has bee sting along the volar aspect of the left wrist. Psychiatric: Mood and affect are normal for age. Speech and behavior are normal.   ____________________________________________   LABS (all labs ordered are listed, but only abnormal results are displayed)  Labs Reviewed - No data to display ____________________________________________  EKG   ____________________________________________  RADIOLOGY   No results found.  ____________________________________________    PROCEDURES  Procedure(s) performed:     Procedures     Medications  HYDROcodone-acetaminophen (NORCO/VICODIN) 5-325 MG per tablet 1 tablet (has no administration in time range)  ondansetron (ZOFRAN-ODT) disintegrating tablet 4 mg (has no administration in time range)  predniSONE (DELTASONE) tablet 60 mg (has no administration in time range)  famotidine (PEPCID) tablet 40 mg (has no administration in time range)     ____________________________________________   INITIAL IMPRESSION / ASSESSMENT AND PLAN / ED COURSE  Pertinent labs & imaging results that were available during my care of the patient were reviewed by me and considered in my medical decision making (see chart for details).      Assessment and plan Allergic reaction 64 year old male presents to the emergency department with localized pain and soft tissue swelling from a bee sting.  Patient was hypertensive at triage vital signs were otherwise reassuring.  On physical exam, patient had no increased work of breathing.  There is no wheezing auscultated and patient was resting comfortably.  He was given prednisone and famotidine in the emergency department.  He was discharged with prednisone, and famotidine.  He was  advised to continue taking Benadryl every 8 hours until symptoms resolve.  He was also prescribed an EpiPen.  Patient was given instructions on indications for EpiPen use.  All questions were answered and return precautions were given to return with new or worsening symptoms.  ____________________________________________  FINAL CLINICAL IMPRESSION(S) / ED DIAGNOSES  Final diagnoses:  Insect bite of wrist, unspecified laterality, initial encounter      NEW MEDICATIONS STARTED DURING THIS VISIT:  ED Discharge Orders         Ordered    predniSONE (DELTASONE) 50 MG tablet     Discontinue  Reprint     07/31/19 2247    famotidine (PEPCID) 20 MG tablet  2 times daily     Discontinue  Reprint     07/31/19 2247    EPINEPHrine 0.3 mg/0.3 mL IJ SOAJ injection  As needed     Discontinue  Reprint     07/31/19 2247              This chart was dictated using voice recognition  software/Dragon. Despite best efforts to proofread, errors can occur which can change the meaning. Any change was purely unintentional.     Lannie Fields, PA-C 07/31/19 2257    Harvest Dark, MD 07/31/19 [phone]

## 2019-07-31 NOTE — ED Notes (Signed)
Pt unable to sign E-signature due to signature pad malfunction. Pt verbalized understanding of d/c instructions and had no additional questions or concerns for this RN or provider. Pt left with d/c instructions and gathered all personal belongings from room and removed them prior to ED departure.   

## 2019-07-31 NOTE — ED Triage Notes (Signed)
Patient ambulatory to triage with steady gait, without difficulty or distress noted; pt reports bee sting to left inner wrist at 815pm; took 3 benadryl at that time; c/o itching and pain to site; denies any other accomp symptoms

## 2019-07-31 NOTE — Discharge Instructions (Signed)
Take prednisone once a day for the next 3 days. Take 2 tablets of Pepcid once daily for the next 5 days. Take Benadryl every 8 hours for the next 5 days.

## 2019-08-02 ENCOUNTER — Ambulatory Visit: Payer: Self-pay | Admitting: Nurse Practitioner

## 2019-08-29 ENCOUNTER — Ambulatory Visit: Payer: Self-pay | Admitting: Nurse Practitioner

## 2019-09-10 ENCOUNTER — Ambulatory Visit: Payer: Self-pay | Admitting: Nurse Practitioner

## 2019-10-03 ENCOUNTER — Ambulatory Visit: Payer: Self-pay | Admitting: Gastroenterology

## 2020-11-25 DIAGNOSIS — H25013 Cortical age-related cataract, bilateral: Secondary | ICD-10-CM | POA: Diagnosis not present

## 2021-01-12 DIAGNOSIS — H2513 Age-related nuclear cataract, bilateral: Secondary | ICD-10-CM | POA: Diagnosis not present

## 2021-01-12 DIAGNOSIS — H2511 Age-related nuclear cataract, right eye: Secondary | ICD-10-CM | POA: Diagnosis not present

## 2021-01-12 DIAGNOSIS — H524 Presbyopia: Secondary | ICD-10-CM | POA: Diagnosis not present

## 2021-01-12 DIAGNOSIS — H25013 Cortical age-related cataract, bilateral: Secondary | ICD-10-CM | POA: Diagnosis not present

## 2021-01-12 DIAGNOSIS — H2589 Other age-related cataract: Secondary | ICD-10-CM | POA: Diagnosis not present

## 2021-01-12 DIAGNOSIS — H5703 Miosis: Secondary | ICD-10-CM | POA: Diagnosis not present

## 2021-03-10 DIAGNOSIS — H2511 Age-related nuclear cataract, right eye: Secondary | ICD-10-CM | POA: Diagnosis not present

## 2021-03-10 DIAGNOSIS — H25811 Combined forms of age-related cataract, right eye: Secondary | ICD-10-CM | POA: Diagnosis not present

## 2021-03-10 HISTORY — PX: CATARACT EXTRACTION: SUR2

## 2021-03-17 DIAGNOSIS — H2511 Age-related nuclear cataract, right eye: Secondary | ICD-10-CM | POA: Diagnosis not present

## 2021-04-28 ENCOUNTER — Ambulatory Visit: Payer: Self-pay | Admitting: *Deleted

## 2021-04-28 NOTE — Telephone Encounter (Signed)
?  Chief Complaint: able to speak with patient's wife, shortness of breath with exertion ?Symptoms: shortness of breath esp. With minimal exertion, hx COPD ?Frequency: x1 week  ?Pertinent Negatives: Patient denies chest pain , no fever, no sweating , no dizziness lightheadedness ?Disposition: [x] ED /[] Urgent Care (no appt availability in office) / [] Appointment(In office/virtual)/ []  [facility]/ [] Home Care/ [] Refused Recommended Disposition /[] [facility]/ []  Follow-up with PCP ?Additional Notes:  ? ?Recommended patient's wife to monitor sx and go to ED for evaluation due to description of sx. Wife reports patient has appt with PCP 04/30/21. Reinforced to wife if sx are worsening go to ED.  Please advise  ? ? ? ?Reason for Disposition ? [1] MILD difficulty breathing (e.g., minimal/no SOB at rest, SOB with walking, pulse <100) AND [2] NEW-onset or WORSE than normal ? ?Answer Assessment - Initial Assessment Questions ?1. RESPIRATORY STATUS: "Describe your breathing?" (e.g., wheezing, shortness of breath, unable to speak, severe coughing)  ?    Shortness of breath, hx COPD per patient's wife  ?2. ONSET: "When did this breathing problem begin?"  ?    1 week  ?3. PATTERN "Does the difficult breathing come and go, or has it been constant since it started?"  ?    Comes and goes exp with any exertion ?4. SEVERITY: "How bad is your breathing?" (e.g., mild, moderate, severe)  ?  - MILD: No SOB at rest, mild SOB with walking, speaks normally in sentences, can lie down, no retractions, pulse < 100.  ?  - MODERATE: SOB at rest, SOB with minimal exertion and prefers to sit, cannot lie down flat, speaks in phrases, mild retractions, audible wheezing, pulse 100-120.  ?  - SEVERE: Very SOB at rest, speaks in single words, struggling to breathe, sitting hunched forward, retractions, pulse > 120  ?    Na   ?5. RECURRENT SYMPTOM: "Have you had difficulty breathing before?" If Yes, ask: "When was the last time?"  and "What happened that time?"  ?    na ?6. CARDIAC HISTORY: "Do you have any history of heart disease?" (e.g., heart attack, angina, bypass surgery, angioplasty)  ?    See hx ?7. LUNG HISTORY: "Do you have any history of lung disease?"  (e.g., pulmonary embolus, asthma, emphysema) ?    Hx COPD ?8. CAUSE: "What do you think is causing the breathing problem?"  ?    Not sure  ?9. OTHER SYMPTOMS: "Do you have any other symptoms? (e.g., dizziness, runny nose, cough, chest pain, fever) ?    Difficulty breathing with short distances  ?10. O2 SATURATION MONITOR:  "Do you use an oxygen saturation monitor (pulse oximeter) at home?" If Yes, "What is your reading (oxygen level) today?" "What is your usual oxygen saturation reading?" (e.g., 95%) ?      na ?11. PREGNANCY: "Is there any chance you are pregnant?" "When was your last menstrual period?" ?      na ?12. TRAVEL: "Have you traveled out of the country in the last month?" (e.g., travel history, exposures) ?      na ? ?Protocols used: Breathing Difficulty-A-AH ? ?

## 2021-04-28 NOTE — Telephone Encounter (Signed)
Summary: pt SOB but not home/wife calling  ? Wife called while pt was at the store to make him appt for breathing issues.  She said she is very worried about him.  He stays SOB all the time.  Difficult for him to go from A to B w/out getting winded. She asked for appt Thurs, which I scheduled.  She said he has another appt in [facility] tomorrow, or she would come then.  ?She said she told him she was going to make him   ?  ? ?Called patient at [phone], no answer, VM has not been set up and unable to leave message. Called patient's wife,DPR [phone] no answer, LVMTCB [phone]. ?

## 2021-04-29 DIAGNOSIS — H2512 Age-related nuclear cataract, left eye: Secondary | ICD-10-CM | POA: Diagnosis not present

## 2021-04-29 DIAGNOSIS — H25012 Cortical age-related cataract, left eye: Secondary | ICD-10-CM | POA: Diagnosis not present

## 2021-04-29 NOTE — Telephone Encounter (Signed)
Noted has appt 04/30/21, if worsening prior then recommend ER. ?

## 2021-04-30 ENCOUNTER — Ambulatory Visit
Admission: RE | Admit: 2021-04-30 | Discharge: 2021-04-30 | Disposition: A | Discharge status: Home / Self Care | Payer: Medicare HMO | Source: Ambulatory Visit | Attending: Family Medicine | Admitting: Family Medicine

## 2021-04-30 ENCOUNTER — Encounter: Payer: Self-pay | Admitting: Family Medicine

## 2021-04-30 ENCOUNTER — Ambulatory Visit
Admission: RE | Admit: 2021-04-30 | Discharge: 2021-04-30 | Disposition: A | Discharge status: Home / Self Care | Payer: Medicare HMO | Attending: Family Medicine | Admitting: Family Medicine

## 2021-04-30 ENCOUNTER — Ambulatory Visit (INDEPENDENT_AMBULATORY_CARE_PROVIDER_SITE_OTHER): Payer: Medicare HMO | Admitting: Family Medicine

## 2021-04-30 ENCOUNTER — Telehealth: Payer: Self-pay | Admitting: Nurse Practitioner

## 2021-04-30 VITALS — BP 129/76 | HR 73 | Temp 98.0°F | Wt 222.8 lb

## 2021-04-30 DIAGNOSIS — J441 Chronic obstructive pulmonary disease with (acute) exacerbation: Secondary | ICD-10-CM | POA: Insufficient documentation

## 2021-04-30 DIAGNOSIS — Z1159 Encounter for screening for other viral diseases: Secondary | ICD-10-CM

## 2021-04-30 DIAGNOSIS — Z125 Encounter for screening for malignant neoplasm of prostate: Secondary | ICD-10-CM | POA: Diagnosis not present

## 2021-04-30 DIAGNOSIS — R0602 Shortness of breath: Secondary | ICD-10-CM | POA: Diagnosis not present

## 2021-04-30 DIAGNOSIS — Z136 Encounter for screening for cardiovascular disorders: Secondary | ICD-10-CM | POA: Diagnosis not present

## 2021-04-30 DIAGNOSIS — Z13220 Encounter for screening for lipoid disorders: Secondary | ICD-10-CM | POA: Diagnosis not present

## 2021-04-30 DIAGNOSIS — J449 Chronic obstructive pulmonary disease, unspecified: Secondary | ICD-10-CM | POA: Diagnosis not present

## 2021-04-30 MED ORDER — TRIAMCINOLONE ACETONIDE 40 MG/ML IJ SUSP
40.0000 mg | Freq: Once | INTRAMUSCULAR | Status: AC
  Administered 2021-04-30: 40 mg via INTRAMUSCULAR

## 2021-04-30 MED ORDER — PREDNISONE 10 MG PO TABS: ORAL_TABLET | ORAL | 0 refills | Status: DC

## 2021-04-30 MED ORDER — AZITHROMYCIN 250 MG PO TABS: ORAL_TABLET | ORAL | 0 refills | Status: AC

## 2021-04-30 MED ORDER — BUDESONIDE-FORMOTEROL FUMARATE 160-4.5 MCG/ACT IN AERO: 2.0000 | INHALATION_SPRAY | Freq: Two times a day (BID) | RESPIRATORY_TRACT | 3 refills | Status: DC

## 2021-04-30 MED ORDER — ALBUTEROL SULFATE HFA 108 (90 BASE) MCG/ACT IN AERS: 2.0000 | INHALATION_SPRAY | Freq: Four times a day (QID) | RESPIRATORY_TRACT | 1 refills | Status: DC | PRN

## 2021-04-30 NOTE — Progress Notes (Signed)
? ?BP 129/76   Pulse 73   Temp 98 ?F (36.7 ?C)   Wt 222 lb 12.8 oz (101.1 kg)   SpO2 93%   BMI 33.88 kg/m?   ? ?Subjective:  ? ? Patient ID: Maurice Simpson, male    DOB: 01/25/1955, 66 y.o.   MRN: [id number] ? ?HPI: ?Maurice Simpson is a 66 y.o. male who presents today for follow up on his breathing after being lost to follow up for 21 months.  ? ?Chief Complaint  ?Patient presents with  ? COPD  ?  Patient states for the past 2 weeks he's had SOB and slight chest discomfort that comes and goes. Patient states he is coughing and wheezing.   ? ?COPD- did not pick up his inhalers after last visit as he couldn't afford them. Has been OK but having an issue with his breathing for about 2 weeks ?COPD status: exacerbated ?Satisfied with current treatment?: no ?Oxygen use: no ?Dyspnea frequency: with movement ?Cough frequency: many times a day ?Rescue inhaler frequency:  doesn't have one ?Limitation of activity: yes ?Productive cough:  ?Last Spirometry:  ?Pneumovax:  Refused ?Influenza: Not up to Date ? ?Relevant past medical, surgical, family and social history reviewed and updated as indicated. Interim medical history since our last visit reviewed. ?Allergies and medications reviewed and updated. ? ?Review of Systems  ?Constitutional: Negative.   ?HENT: Negative.    ?Respiratory:  Positive for cough, chest tightness, shortness of breath and wheezing. Negative for apnea, choking and stridor.   ?Cardiovascular: Negative.   ?Gastrointestinal: Negative.   ?Musculoskeletal: Negative.   ?Psychiatric/Behavioral: Negative.    ? ?Per HPI unless specifically indicated above ? ?   ?Objective:  ?  ?BP 129/76   Pulse 73   Temp 98 ?F (36.7 ?C)   Wt 222 lb 12.8 oz (101.1 kg)   SpO2 93%   BMI 33.88 kg/m?   ?Wt Readings from Last 3 Encounters:  ?04/30/21 222 lb 12.8 oz (101.1 kg)  ?07/31/19 225 lb (102.1 kg)  ?07/20/19 223 lb 6.4 oz (101.3 kg)  ?  ?Physical Exam ?Vitals and nursing note reviewed.  ?Constitutional:   ?   General: He is  not in acute distress. ?   Appearance: Normal appearance. He is not ill-appearing, toxic-appearing or diaphoretic.  ?HENT:  ?   Head: Normocephalic and atraumatic.  ?   Right Ear: External ear normal.  ?   Left Ear: External ear normal.  ?   Nose: Nose normal.  ?   Mouth/Throat:  ?   Mouth: Mucous membranes are moist.  ?   Pharynx: Oropharynx is clear.  ?Eyes:  ?   General: No scleral icterus.    ?   Right eye: No discharge.     ?   Left eye: No discharge.  ?   Extraocular Movements: Extraocular movements intact.  ?   Conjunctiva/sclera: Conjunctivae normal.  ?   Pupils: Pupils are equal, round, and reactive to light.  ?Cardiovascular:  ?   Rate and Rhythm: Normal rate and regular rhythm.  ?   Pulses: Normal pulses.  ?   Heart sounds: Normal heart sounds. No murmur heard. ?  No friction rub. No gallop.  ?Pulmonary:  ?   Effort: Pulmonary effort is normal. No respiratory distress.  ?   Breath sounds: No stridor. Wheezing present. No rhonchi or rales.  ?Chest:  ?   Chest wall: No tenderness.  ?Musculoskeletal:     ?   General: Normal  range of motion.  ?   Cervical back: Normal range of motion and neck supple.  ?Skin: ?   General: Skin is warm and dry.  ?   Capillary Refill: Capillary refill takes less than 2 seconds.  ?   Coloration: Skin is not jaundiced or pale.  ?   Findings: No bruising, erythema, lesion or rash.  ?Neurological:  ?   General: No focal deficit present.  ?   Mental Status: He is alert and oriented to person, place, and time. Mental status is at baseline.  ?Psychiatric:     ?   Mood and Affect: Mood normal.     ?   Behavior: Behavior normal.     ?   Thought Content: Thought content normal.     ?   Judgment: Judgment normal.  ? ? ?Results for orders placed or performed in visit on 07/20/19  ?CBC with Differential/Platelet  ?Result Value Ref Range  ? WBC 8.0 3.4 - 10.8 x10E3/uL  ? RBC 5.42 4.14 - 5.80 x10E6/uL  ? Hemoglobin 17.1 13.0 - 17.7 g/dL  ? Hematocrit 49.5 37.5 - 51.0 %  ? MCV 91 79 - 97 fL  ?  MCH 31.5 26.6 - 33.0 pg  ? MCHC 34.5 31.5 - 35.7 g/dL  ? RDW 12.9 11.6 - 15.4 %  ? Platelets 117 (L) 150 - 450 x10E3/uL  ? Neutrophils 53 Not Estab. %  ? Lymphs 37 Not Estab. %  ? Monocytes 6 Not Estab. %  ? Eos 3 Not Estab. %  ? Basos 1 Not Estab. %  ? Neutrophils Absolute 4.2 1.4 - 7.0 x10E3/uL  ? Lymphocytes Absolute 3.0 0.7 - 3.1 x10E3/uL  ? Monocytes Absolute 0.4 0.1 - 0.9 x10E3/uL  ? EOS (ABSOLUTE) 0.3 0.0 - 0.4 x10E3/uL  ? Basophils Absolute 0.1 0.0 - 0.2 x10E3/uL  ? Immature Granulocytes 0 Not Estab. %  ? Immature Grans (Abs) 0.0 0.0 - 0.1 x10E3/uL  ? Hematology Comments: Note:   ?Comprehensive metabolic panel  ?Result Value Ref Range  ? Glucose 110 (H) 65 - 99 mg/dL  ? BUN 8 8 - 27 mg/dL  ? Creatinine, Ser 0.89 0.76 - 1.27 mg/dL  ? GFR calc non Af Amer 90 >59 mL/min/1.73  ? GFR calc Af Amer 104 >59 mL/min/1.73  ? BUN/Creatinine Ratio 9 (L) 10 - 24  ? Sodium 136 134 - 144 mmol/L  ? Potassium 3.8 3.5 - 5.2 mmol/L  ? Chloride 100 96 - 106 mmol/L  ? CO2 22 20 - 29 mmol/L  ? Calcium 9.2 8.6 - 10.2 mg/dL  ? Total Protein 6.9 6.0 - 8.5 g/dL  ? Albumin 4.3 3.8 - 4.8 g/dL  ? Globulin, Total 2.6 1.5 - 4.5 g/dL  ? Albumin/Globulin Ratio 1.7 1.2 - 2.2  ? Bilirubin Total 0.9 0.0 - 1.2 mg/dL  ? Alkaline Phosphatase 56 48 - 121 IU/L  ? AST 27 0 - 40 IU/L  ? ALT 28 0 - 44 IU/L  ?TSH  ?Result Value Ref Range  ? TSH 2.500 0.450 - 4.500 uIU/mL  ?PSA  ?Result Value Ref Range  ? Prostate Specific Ag, Serum 0.5 0.0 - 4.0 ng/mL  ? ?   ?Assessment & Plan:  ? ?Problem List Items Addressed This Visit   ? ?  ? Respiratory  ? COPD (chronic obstructive pulmonary disease) ([place]) - Primary  ?  Will treat with prednisone and azithromycin. Start symbicort. Follow up with PCP in 1-2 weeks. Will check labs and CXR.  Call with any concerns. ? ?  ?  ? Relevant Medications  ? predniSONE (DELTASONE) 10 MG tablet  ? azithromycin (ZITHROMAX) 250 MG tablet  ? budesonide-formoterol (SYMBICORT) 160-4.5 MCG/ACT inhaler  ? albuterol (VENTOLIN HFA) 108  (90 Base) MCG/ACT inhaler  ? Other Relevant Orders  ? Spirometry with graph (Completed)  ? DG Chest 2 View  ? Comprehensive metabolic panel  ? CBC with Differential/Platelet  ? TSH  ? ?Other Visit Diagnoses   ? ? Screening for prostate cancer      ? Labs drawn today. Await results.   ? Relevant Orders  ? PSA  ? Screening for cholesterol level      ? Labs drawn today. Await results.   ? Relevant Orders  ? Lipid Panel w/o Chol/HDL Ratio  ? Encounter for hepatitis C screening test for low risk patient      ? Labs drawn today. Await results.   ? Relevant Orders  ? Hepatitis C Antibody  ? ?  ?  ? ?Follow up plan: ?Return 1-2 weeks follow up with PCP for surgical clearance/follow up breathing. ? ? ? ? ? ?

## 2021-04-30 NOTE — Telephone Encounter (Signed)
Copied from [place] [id number]. Topic: General - Other ?>> Apr 29, 2021  3:43 PM McGill, Nelva Bush wrote: ?Reason for CRM: Allie from [facility] stated faxed clearance form today for pt cataract surgery on 05/05/2021.  ? ?Fax- [phone] ?Call back- [phone] ?

## 2021-04-30 NOTE — Assessment & Plan Note (Signed)
Will treat with prednisone and azithromycin. Start symbicort. Follow up with PCP in 1-2 weeks. Will check labs and CXR. Call with any concerns. ?

## 2021-05-01 ENCOUNTER — Encounter: Payer: Self-pay | Admitting: Family Medicine

## 2021-05-01 LAB — TSH: TSH: 2.96 u[IU]/mL (ref 0.450–4.500)

## 2021-05-01 LAB — COMPREHENSIVE METABOLIC PANEL
ALT: 21 IU/L (ref 0–44)
AST: 29 IU/L (ref 0–40)
Albumin/Globulin Ratio: 1.5 (ref 1.2–2.2)
Albumin: 4.3 g/dL (ref 3.8–4.8)
Alkaline Phosphatase: 59 IU/L (ref 44–121)
BUN/Creatinine Ratio: 14 (ref 10–24)
BUN: 12 mg/dL (ref 8–27)
Bilirubin Total: 0.8 mg/dL (ref 0.0–1.2)
CO2: 20 mmol/L (ref 20–29)
Calcium: 9.4 mg/dL (ref 8.6–10.2)
Chloride: 101 mmol/L (ref 96–106)
Creatinine, Ser: 0.85 mg/dL (ref 0.76–1.27)
Globulin, Total: 2.9 g/dL (ref 1.5–4.5)
Glucose: 94 mg/dL (ref 70–99)
Potassium: 4.2 mmol/L (ref 3.5–5.2)
Sodium: 136 mmol/L (ref 134–144)
Total Protein: 7.2 g/dL (ref 6.0–8.5)
eGFR: 96 mL/min/{1.73_m2} (ref >59)

## 2021-05-01 LAB — CBC WITH DIFFERENTIAL/PLATELET
Basophils Absolute: 0.1 10*3/uL (ref 0.0–0.2)
Basos: 1 %
EOS (ABSOLUTE): 0.3 10*3/uL (ref 0.0–0.4)
Eos: 4 %
Hematocrit: 53.3 % — ABNORMAL HIGH (ref 37.5–51.0)
Hemoglobin: 18 g/dL — ABNORMAL HIGH (ref 13.0–17.7)
Immature Grans (Abs): 0 10*3/uL (ref 0.0–0.1)
Immature Granulocytes: 0 %
Lymphocytes Absolute: 2.9 10*3/uL (ref 0.7–3.1)
Lymphs: 33 %
MCH: 30.9 pg (ref 26.6–33.0)
MCHC: 33.8 g/dL (ref 31.5–35.7)
MCV: 91 fL (ref 79–97)
Monocytes Absolute: 0.6 10*3/uL (ref 0.1–0.9)
Monocytes: 7 %
Neutrophils Absolute: 4.8 10*3/uL (ref 1.4–7.0)
Neutrophils: 55 %
Platelets: 156 10*3/uL (ref 150–450)
RBC: 5.83 x10E6/uL — ABNORMAL HIGH (ref 4.14–5.80)
RDW: 13 % (ref 11.6–15.4)
WBC: 8.7 10*3/uL (ref 3.4–10.8)

## 2021-05-01 LAB — LIPID PANEL W/O CHOL/HDL RATIO
Cholesterol, Total: 142 mg/dL (ref 100–199)
HDL: 26 mg/dL — ABNORMAL LOW (ref >39)
LDL Chol Calc (NIH): 80 mg/dL (ref 0–99)
Triglycerides: 213 mg/dL — ABNORMAL HIGH (ref 0–149)
VLDL Cholesterol Cal: 36 mg/dL (ref 5–40)

## 2021-05-01 LAB — PSA: Prostate Specific Ag, Serum: 0.5 ng/mL (ref 0.0–4.0)

## 2021-05-01 LAB — HEPATITIS C ANTIBODY: Hep C Virus Ab: NONREACTIVE

## 2021-05-02 NOTE — Patient Instructions (Addendum)
Look into coverage for Stiolto or Bevespi ? ?Can take Pepcid for heart burn and Metamucil for constipation until you have completed Prednisone. ? ?Chronic Obstructive Pulmonary Disease Exacerbation ? ?Chronic obstructive pulmonary disease (COPD) is a long-term (chronic) lung problem. In COPD, the flow of air from the lungs is limited. ?COPD exacerbations are times that breathing gets worse and you need more than your normal treatment. Without treatment, they can be life-threatening. If they happen often, your lungs can become more damaged. ?What are the causes? ?Having infections that affect your airways and lungs. ?Being exposed to: ?Smoke. ?Air pollution. ?Chemical fumes. ?Dust. ?Things that can cause an allergic reaction (allergens). ?Not taking your usual COPD medicines as told. ?Having medical problems already, such as heart failure or infections not involving the lungs. ?In many cases, the cause is not known. ?What increases the risk? ?Smoking. ?Being an older adult. ?Having frequent prior COPD exacerbations. ?What are the signs or symptoms? ?Increased coughing. ?Increased mucus from your lungs. ?Increased wheezing. ?Increased shortness of breath. ?Fast breathing and finding it hard to breathe. ?Chest tightness. ?Less energy than usual. ?Sleep disruption from symptoms. ?Confusion. ?Increased sleepiness. ?Often, these symptoms happen or get worse even with the use of medicines. ?How is this treated? ?Treatment for this condition depends on how bad it is and the cause of the symptoms. You may need to stay in the hospital for treatment. Treatment may include: ?Taking medicines. ?Using oxygen. ?Being treated with different ways to clear your airway, such as using a mask to deliver oxygen. ?Follow these instructions at home: ?Medicines ?Take over-the-counter and prescription medicines only as told by your doctor. ?Use all inhaled medicines the correct way. ?If you were prescribed an antibiotic or steroid medicine,  take it as told by your doctor. Do not stop taking it even if you start to feel better. ?Lifestyle ?Do not smoke or use any products that contain nicotine or tobacco. If you need help quitting, ask your doctor. ?Eat healthy foods. ?Exercise regularly. ?Get enough sleep. Most adults need 7 or more hours per night. ?Avoid tobacco smoke and other things that can bother your lungs. ?Several times a day, wash your hands with soap and water for at least 20 seconds. If you cannot use soap and water, use hand sanitizer. This may help keep you from getting an infection. ?During flu season, avoid areas that are crowded with people. ?General instructions ?Drink enough fluid to keep your pee (urine) pale yellow. Do not do this if your doctor has told you not to. ?Use a cool mist machine (vaporizer). ?If you use oxygen or a machine that turns medicine into a mist (nebulizer), continue to use it as told. ?Keep all follow-up visits. ?How is this prevented? ?Keep up with shots (vaccinations) as told by your doctor. Be sure to get a yearly flu (influenza) shot. ?If you smoke, quit smoking. Smoking makes the problem worse. ?Follow all instructions for rehabilitation. These are steps you can take to make your body work better. ?Work with your doctor to develop and follow an action plan. This tells you what steps to take when you experience certain symptoms. ?Contact a doctor if: ?Your COPD symptoms get worse than normal. ?Get help right away if: ?You are short of breath and it gets worse, even when you are resting. ?You have trouble talking. ?You have chest pain. ?You cough up blood. ?You have a fever. ?You keep vomiting. ?You feel weak or you pass out (faint). ?You feel confused. ?You are  not able to sleep because of your symptoms. ?You have trouble doing daily activities. ?These symptoms may be an emergency. Get help right away. Call your local emergency services (911 in the [place] ?Do not wait to see if the symptoms will go  away. ?Do not drive yourself to the hospital. ?Summary ?COPD exacerbations are times that breathing gets worse and you need more treatment than normal. ?COPD exacerbations can be very serious and may cause your lungs to become more damaged. ?Do not smoke. If you need help quitting, ask your doctor. ?Stay up to date on your shots. Get a flu shot every year. ?This information is not intended to replace advice given to you by your health care provider. Make sure you discuss any questions you have with your health care provider. ?Document Revised: 11/21/2019 Document Reviewed: 11/06/2019 ?Elsevier Patient Education ? [place]. ? ?

## 2021-05-04 ENCOUNTER — Encounter: Payer: Self-pay | Admitting: Nurse Practitioner

## 2021-05-04 ENCOUNTER — Ambulatory Visit (INDEPENDENT_AMBULATORY_CARE_PROVIDER_SITE_OTHER): Payer: Medicare HMO | Admitting: Nurse Practitioner

## 2021-05-04 VITALS — BP 138/77 | HR 64 | Temp 98.2°F | Ht 68.0 in | Wt 224.0 lb

## 2021-05-04 DIAGNOSIS — Z800 Family history of malignant neoplasm of digestive organs: Secondary | ICD-10-CM | POA: Diagnosis not present

## 2021-05-04 DIAGNOSIS — F17210 Nicotine dependence, cigarettes, uncomplicated: Secondary | ICD-10-CM

## 2021-05-04 DIAGNOSIS — Z1211 Encounter for screening for malignant neoplasm of colon: Secondary | ICD-10-CM | POA: Diagnosis not present

## 2021-05-04 DIAGNOSIS — K5903 Drug induced constipation: Secondary | ICD-10-CM | POA: Diagnosis not present

## 2021-05-04 DIAGNOSIS — J439 Emphysema, unspecified: Secondary | ICD-10-CM

## 2021-05-04 NOTE — Assessment & Plan Note (Signed)
Present with using Prednisone recently.  At this time recommend Pepcid for GERD symptoms and Metamucil daily for constipation until symptoms improved and has completed steroid therapy. ?

## 2021-05-04 NOTE — Progress Notes (Signed)
? ?BP 138/77   Pulse 64   Temp 98.2 ?F (36.8 ?C) (Oral)   Ht $R'5\' 8"'CV$  (1.727 m)   Wt 224 lb (101.6 kg)   SpO2 95%   BMI 34.06 kg/m?   ? ?Subjective:  ? ? Patient ID: Maurice Simpson, male    DOB: Nov 12, 1955, 66 y.o.   MRN: [id number] ? ?HPI: ?Maurice Simpson is a 66 y.o. male ? ?Chief Complaint  ?Patient presents with  ? Pre-op Exam  ?  Patient is here for surgical clearance for Cataract Eye Surgery.   ? Cough  ?  Patient states his cough has gotten a little better, but one of the medications that he was prescribed he is not sure which one, but one is causing abdominal pain and constipation. Patient denies having any recent chest pains.   ? ?COPD EXACERBATION ?Seen on 04/30/21 for COPD exacerbation == treated with ZPack and Prednisone + started on Symbicort and Albuterol inhalers with PFTs performed at that visit.  Denies any further chest pain or shortness of breath.  He reports after recent treatment has had indigestion and constipation.  Is scheduled for cataract surgery tomorrow, needs clearance from recent illness -- at this time symptoms have improved. ? ?Is having indigestion and and constipation with Prednisone. ? ?Does endorse improvement in breathing with inhalers -- can get outside and do activities.   ?Fever: no ?Cough:  improving ?Shortness of breath:  improved ?Wheezing:  improving ?Chest pain:  improved with recent treatment ?Chest tightness: no ?Chest congestion: no ?Nasal congestion: no ?Runny nose: no ?Post nasal drip: no ?Sneezing: no ?Sore throat: no ?Swollen glands: no ?Sinus pressure: no ?Headache: no ?Face pain: no ?Toothache: no ?Ear pain: none ?Ear pressure: none ?Eyes red/itching:no ?Eye drainage/crusting: no  ?Vomiting: no ?Rash: no ?Fatigue: no ?Sick contacts: no ?Strep contacts: no  ?Context: better ?Recurrent sinusitis: no ?Relief with OTC cold/cough medications: yes  ?Treatments attempted: abx and Prednisone    ? ?Relevant past medical, surgical, family and social history reviewed and  updated as indicated. Interim medical history since our last visit reviewed. ?Allergies and medications reviewed and updated. ? ?Review of Systems  ?Constitutional:  Negative for activity change, diaphoresis, fatigue and fever.  ?Respiratory:  Positive for cough (improving). Negative for chest tightness, shortness of breath and wheezing.   ?Cardiovascular:  Negative for chest pain, palpitations and leg swelling.  ?Gastrointestinal: Negative.   ?Neurological: Negative.   ?Psychiatric/Behavioral: Negative.    ? ?Per HPI unless specifically indicated above ? ?   ?Objective:  ?  ?BP 138/77   Pulse 64   Temp 98.2 ?F (36.8 ?C) (Oral)   Ht $R'5\' 8"'VQ$  (1.727 m)   Wt 224 lb (101.6 kg)   SpO2 95%   BMI 34.06 kg/m?   ?Wt Readings from Last 3 Encounters:  ?05/04/21 224 lb (101.6 kg)  ?04/30/21 222 lb 12.8 oz (101.1 kg)  ?07/31/19 225 lb (102.1 kg)  ?  ?Physical Exam ?Vitals and nursing note reviewed.  ?Constitutional:   ?   General: He is awake. He is not in acute distress. ?   Appearance: He is well-developed and well-groomed. He is obese. He is not ill-appearing.  ?HENT:  ?   Head: Normocephalic and atraumatic.  ?   Right Ear: Hearing normal. No drainage.  ?   Left Ear: Hearing normal. No drainage.  ?Eyes:  ?   General: Lids are normal.     ?   Right eye: No discharge.     ?  Left eye: No discharge.  ?   Conjunctiva/sclera: Conjunctivae normal.  ?   Pupils: Pupils are equal, round, and reactive to light.  ?Neck:  ?   Thyroid: No thyromegaly.  ?   Vascular: No carotid bruit.  ?   Trachea: Trachea normal.  ?Cardiovascular:  ?   Rate and Rhythm: Normal rate and regular rhythm.  ?   Heart sounds: Normal heart sounds, S1 normal and S2 normal. No murmur heard. ?  No gallop.  ?Pulmonary:  ?   Effort: Pulmonary effort is normal. No accessory muscle usage or respiratory distress.  ?   Breath sounds: Wheezing present.  ?   Comments: Expiratory wheezes noted throughout lung fields.  No SOB with talking. ?Abdominal:  ?   General: Bowel  sounds are normal. There is no distension (soft abdomen).  ?   Palpations: Abdomen is soft. There is no hepatomegaly or mass.  ?   Tenderness: There is no abdominal tenderness. There is no right CVA tenderness or left CVA tenderness.  ?   Hernia: No hernia is present.  ?   Comments: Normal bowel sounds and normal percussion.  ?Musculoskeletal:     ?   General: Normal range of motion.  ?   Cervical back: Normal range of motion and neck supple.  ?   Right lower leg: No edema.  ?   Left lower leg: No edema.  ?Lymphadenopathy:  ?   Cervical: No cervical adenopathy.  ?Skin: ?   General: Skin is warm and dry.  ?   Capillary Refill: Capillary refill takes less than 2 seconds.  ?   Findings: No rash.  ?Neurological:  ?   Mental Status: He is alert and oriented to person, place, and time.  ?   Deep Tendon Reflexes: Reflexes are normal and symmetric.  ?Psychiatric:     ?   Mood and Affect: Mood normal.     ?   Behavior: Behavior normal. Behavior is cooperative.     ?   Thought Content: Thought content normal.     ?   Judgment: Judgment normal.  ? ? ?Results for orders placed or performed in visit on 04/30/21  ?Comprehensive metabolic panel  ?Result Value Ref Range  ? Glucose 94 70 - 99 mg/dL  ? BUN 12 8 - 27 mg/dL  ? Creatinine, Ser 0.85 0.76 - 1.27 mg/dL  ? eGFR 96 >59 mL/min/1.73  ? BUN/Creatinine Ratio 14 10 - 24  ? Sodium 136 134 - 144 mmol/L  ? Potassium 4.2 3.5 - 5.2 mmol/L  ? Chloride 101 96 - 106 mmol/L  ? CO2 20 20 - 29 mmol/L  ? Calcium 9.4 8.6 - 10.2 mg/dL  ? Total Protein 7.2 6.0 - 8.5 g/dL  ? Albumin 4.3 3.8 - 4.8 g/dL  ? Globulin, Total 2.9 1.5 - 4.5 g/dL  ? Albumin/Globulin Ratio 1.5 1.2 - 2.2  ? Bilirubin Total 0.8 0.0 - 1.2 mg/dL  ? Alkaline Phosphatase 59 44 - 121 IU/L  ? AST 29 0 - 40 IU/L  ? ALT 21 0 - 44 IU/L  ?CBC with Differential/Platelet  ?Result Value Ref Range  ? WBC 8.7 3.4 - 10.8 x10E3/uL  ? RBC 5.83 (H) 4.14 - 5.80 x10E6/uL  ? Hemoglobin 18.0 (H) 13.0 - 17.7 g/dL  ? Hematocrit 53.3 (H) 37.5 -  51.0 %  ? MCV 91 79 - 97 fL  ? MCH 30.9 26.6 - 33.0 pg  ? MCHC 33.8 31.5 - 35.7 g/dL  ?  RDW 13.0 11.6 - 15.4 %  ? Platelets 156 150 - 450 x10E3/uL  ? Neutrophils 55 Not Estab. %  ? Lymphs 33 Not Estab. %  ? Monocytes 7 Not Estab. %  ? Eos 4 Not Estab. %  ? Basos 1 Not Estab. %  ? Neutrophils Absolute 4.8 1.4 - 7.0 x10E3/uL  ? Lymphocytes Absolute 2.9 0.7 - 3.1 x10E3/uL  ? Monocytes Absolute 0.6 0.1 - 0.9 x10E3/uL  ? EOS (ABSOLUTE) 0.3 0.0 - 0.4 x10E3/uL  ? Basophils Absolute 0.1 0.0 - 0.2 x10E3/uL  ? Immature Granulocytes 0 Not Estab. %  ? Immature Grans (Abs) 0.0 0.0 - 0.1 x10E3/uL  ?Lipid Panel w/o Chol/HDL Ratio  ?Result Value Ref Range  ? Cholesterol, Total 142 100 - 199 mg/dL  ? Triglycerides 213 (H) 0 - 149 mg/dL  ? HDL 26 (L) >39 mg/dL  ? VLDL Cholesterol Cal 36 5 - 40 mg/dL  ? LDL Chol Calc (NIH) 80 0 - 99 mg/dL  ?PSA  ?Result Value Ref Range  ? Prostate Specific Ag, Serum 0.5 0.0 - 4.0 ng/mL  ?TSH  ?Result Value Ref Range  ? TSH 2.960 0.450 - 4.500 uIU/mL  ?Hepatitis C Antibody  ?Result Value Ref Range  ? Hep C Virus Ab Non Reactive Non Reactive  ? ?   ?Assessment & Plan:  ? ?Problem List Items Addressed This Visit   ? ?  ? Respiratory  ? COPD (chronic obstructive pulmonary disease) ([place]) - Primary  ?  Chronic with recent exacerbation improving.  At this time continue Symbicort and Albuterol, however will see if LABA/LAMA covered for patient, would benefit from this based on GOLD standards.  Consider Stiolto or Bevespi.  Recommend complete cessation of smoking. Discussed at length recommendations for PCV13 and Lung CT cancer screening, he wishes to think about these.  At this time recent exacerbation improving, okay for cataract surgery.  Return in 6 weeks for follow-up. ? ?  ?  ?  ? Other  ? Constipation  ?  Present with using Prednisone recently.  At this time recommend Pepcid for GERD symptoms and Metamucil daily for constipation until symptoms improved and has completed steroid therapy. ? ?  ?  ? Family  history of colon cancer  ?  Referral for colonoscopy placed. ? ?  ?  ? Nicotine dependence, cigarettes, uncomplicated  ?  I have recommended complete cessation of tobacco use. I have discussed various options available

## 2021-05-04 NOTE — Assessment & Plan Note (Signed)
I have recommended complete cessation of tobacco use. I have discussed various options available for assistance with tobacco cessation including over the counter methods (Nicotine gum, patch and lozenges). We also discussed prescription options (Chantix, Nicotine Inhaler / Nasal Spray). The patient is not interested in pursuing any prescription tobacco cessation options at this time.  Spirometry next visit and will discuss lung screening, would benefit from this. ? ?

## 2021-05-04 NOTE — Assessment & Plan Note (Signed)
Chronic with recent exacerbation improving.  At this time continue Symbicort and Albuterol, however will see if LABA/LAMA covered for patient, would benefit from this based on GOLD standards.  Consider Stiolto or Bevespi.  Recommend complete cessation of smoking. Discussed at length recommendations for PCV13 and Lung CT cancer screening, he wishes to think about these.  At this time recent exacerbation improving, okay for cataract surgery.  Return in 6 weeks for follow-up. ?

## 2021-05-04 NOTE — Assessment & Plan Note (Signed)
Referral for colonoscopy placed. ?

## 2021-05-05 ENCOUNTER — Telehealth: Payer: Self-pay

## 2021-05-05 NOTE — Telephone Encounter (Signed)
Unable to contact patient.  No voicemail. ? ?Thanks, ?Sharyn Lull, CMA ?

## 2021-05-05 NOTE — Telephone Encounter (Signed)
Called no answer no voicemail letter sent ?

## 2021-05-11 ENCOUNTER — Ambulatory Visit: Payer: Self-pay

## 2021-05-11 NOTE — Telephone Encounter (Signed)
?  Chief Complaint: dizziness ?Symptoms: dizziness when resting or standing, feels woozy ?Frequency: 2 weeks if not more ?Pertinent Negatives: Patient denies feeling like the room is spinning ?Disposition: [] ED /[] Urgent Care (no appt availability in office) / [x] Appointment(In office/virtual)/ []  [place] [facility]/ [] Home Care/ [] Refused Recommended Disposition /[] [place] [facility]/ []  Follow-up with PCP ?Additional Notes: Pt states this has been going on for several days and feels like the prednisone made it worse and when using inhalers as well. Pt completed 5 days worth of prednisone but just feels like dizziness isn't getting any better. Pt scheduled for appt tomorrow morning at 1120 with Jolene.  ? ?Reason for Disposition ? [1] MODERATE dizziness (e.g., interferes with normal activities) AND [2] has NOT been evaluated by physician for this  (Exception: dizziness caused by heat exposure, sudden standing, or poor fluid intake) ? ?Answer Assessment - Initial Assessment Questions ?1. DESCRIPTION: "Describe your dizziness." ?    Lightheaded  ?2. LIGHTHEADED: "Do you feel lightheaded?" (e.g., somewhat faint, woozy, weak upon standing) ?    Woozy  ?3. VERTIGO: "Do you feel like either you or the room is spinning or tilting?" (i.e. vertigo) ?    no ?4. SEVERITY: "How bad is it?"  "Do you feel like you are going to faint?" "Can you stand and walk?" ?  - MILD: Feels slightly dizzy, but walking normally. ?  - MODERATE: Feels unsteady when walking, but not falling; interferes with normal activities (e.g., school, work). ?  - SEVERE: Unable to walk without falling, or requires assistance to walk without falling; feels like passing out now.  ?    moderate ?5. ONSET:  "When did the dizziness begin?" ?    Several days ?10. OTHER SYMPTOMS: "Do you have any other symptoms?" (e.g., fever, chest pain, vomiting, diarrhea, bleeding) ?      No ? ?Protocols used: Dizziness - Lightheadedness-A-AH ? ?

## 2021-05-12 ENCOUNTER — Encounter: Payer: Self-pay | Admitting: Nurse Practitioner

## 2021-05-12 ENCOUNTER — Ambulatory Visit (INDEPENDENT_AMBULATORY_CARE_PROVIDER_SITE_OTHER): Payer: Medicare HMO | Admitting: Nurse Practitioner

## 2021-05-12 VITALS — BP 124/75 | HR 71 | Temp 98.9°F | Ht 68.0 in | Wt 220.6 lb

## 2021-05-12 DIAGNOSIS — J439 Emphysema, unspecified: Secondary | ICD-10-CM | POA: Diagnosis not present

## 2021-05-12 DIAGNOSIS — Z91030 Bee allergy status: Secondary | ICD-10-CM | POA: Diagnosis not present

## 2021-05-12 DIAGNOSIS — Z6833 Body mass index (BMI) 33.0-33.9, adult: Secondary | ICD-10-CM

## 2021-05-12 DIAGNOSIS — F17210 Nicotine dependence, cigarettes, uncomplicated: Secondary | ICD-10-CM | POA: Diagnosis not present

## 2021-05-12 DIAGNOSIS — R0789 Other chest pain: Secondary | ICD-10-CM | POA: Insufficient documentation

## 2021-05-12 DIAGNOSIS — Z9189 Other specified personal risk factors, not elsewhere classified: Secondary | ICD-10-CM | POA: Diagnosis not present

## 2021-05-12 DIAGNOSIS — R42 Dizziness and giddiness: Secondary | ICD-10-CM | POA: Insufficient documentation

## 2021-05-12 DIAGNOSIS — E6609 Other obesity due to excess calories: Secondary | ICD-10-CM

## 2021-05-12 MED ORDER — ROSUVASTATIN CALCIUM 10 MG PO TABS: 10.0000 mg | ORAL_TABLET | Freq: Every day | ORAL | 4 refills | Status: DC

## 2021-05-12 MED ORDER — BEVESPI AEROSPHERE 9-4.8 MCG/ACT IN AERO: 2.0000 | INHALATION_SPRAY | Freq: Every day | RESPIRATORY_TRACT | 12 refills | Status: DC

## 2021-05-12 MED ORDER — EPINEPHRINE 0.3 MG/0.3ML IJ SOAJ: 0.3000 mg | INTRAMUSCULAR | 4 refills | Status: DC | PRN

## 2021-05-12 NOTE — Patient Instructions (Signed)
COPD and Physical Activity ?Chronic obstructive pulmonary disease (COPD) is a long-term, or chronic, condition that affects the lungs. COPD is a general term that can be used to describe many problems that cause inflammation of the lungs and limit airflow. These conditions include chronic bronchitis and emphysema. ?The main symptom of COPD is shortness of breath, which makes it harder to do even simple tasks. This can also make it harder to exercise and stay active. Talk with your health care provider about treatments to help you breathe better and actions you can take to prevent breathing problems during physical activity. ?What are the benefits of exercising when you have COPD? ?Exercising regularly is an important part of a healthy lifestyle. You can still exercise and do physical activities even though you have COPD. Exercise and physical activity improve your shortness of breath by increasing blood flow (circulation). This causes your heart to pump more oxygen through your body. Moderate exercise can: ?Improve oxygen use. ?Increase your energy level. ?Help with shortness of breath. ?Strengthen your breathing muscles. ?Improve heart health. ?Help with sleep. ?Improve your self-esteem and feelings of self-worth. ?Lower depression, stress, and anxiety. ?Exercise can benefit everyone with COPD. The severity of your disease may affect how hard you can exercise, especially at first, but everyone can benefit. Talk with your health care provider about how much exercise is safe for you, and which activities and exercises are safe for you. ?What actions can I take to prevent breathing problems during physical activity? ?Sign up for a pulmonary rehabilitation program. This type of program may include: ?Education about lung diseases. ?Exercise classes that teach you how to exercise and be more active while improving your breathing. This usually involves: ?Exercise using your lower extremities, such as a stationary  bicycle. ?About 30 minutes of exercise, 2 to 5 times per week, for 6 to 12 weeks. ?Strength training, such as push-ups or leg lifts. ?Nutrition education. ?Group classes in which you can talk with others who also have COPD and learn ways to manage stress. ?If you use an oxygen tank, you should use it while you exercise. Work with your health care provider to adjust your oxygen for your physical activity. Your resting flow rate is different from your flow rate during physical activity. ?How to manage your breathing while exercising ?While you are exercising: ?Take slow breaths. ?Pace yourself, and do nottry to go too fast. ?Purse your lips while breathing out. Pursing your lips is similar to a kissing or whistling position. ?If doing exercise that uses a quick burst of effort, such as weight lifting: ?Breathe in before starting the exercise. ?Breathe out during the hardest part of the exercise, such as raising the weights. ?Where to find support ?You can find support for exercising with COPD from: ?Your health care provider. ?A pulmonary rehabilitation program. ?Your local health department or community health programs. ?Support groups, either online or in-person. Your health care provider may be able to recommend support groups. ?Where to find more information ?You can find more information about exercising with COPD from: ?American Lung Association: lung.org ?COPD Foundation: copdfoundation.org ?Contact a health care provider if: ?Your symptoms get worse. ?You have nausea. ?You have a fever. ?You want to start a new exercise program or a new activity. ?Get help right away if: ?You have chest pain. ?You cannot breathe. ?These symptoms may represent a serious problem that is an emergency. Do not wait to see if the symptoms will go away. Get medical help right away. Call  your local emergency services (911 in the [place] Do not drive yourself to the hospital. ?Summary ?COPD is a general term that can be used to describe  many different lung problems that cause lung inflammation and limit airflow. This includes chronic bronchitis and emphysema. ?Exercise and physical activity improve your shortness of breath by increasing blood flow (circulation). This causes your heart to provide more oxygen to your body. ?Contact your health care provider before starting any exercise program or new activity. Ask your health care provider what exercises and activities are safe for you. ?This information is not intended to replace advice given to you by your health care provider. Make sure you discuss any questions you have with your health care provider. ?Document Revised: 11/06/2019 Document Reviewed: 11/06/2019 ?Elsevier Patient Education ? [place]. ? ?

## 2021-05-12 NOTE — Assessment & Plan Note (Signed)
Acute and episodic -- ?related to GERD vs other etiologies.  EKG in office is reassuring.  Will obtain echo, ordered today -- due to SBE, although suspect this is more related to COPD.  Start Rosuvastatin 10 MG daily, educated him and his wife on this medication and side effects to report to provider.  LDL recently 80, but ASCVD 19.8%.  Will obtain CT lung CA screening to further assess, educated them on this. ?

## 2021-05-12 NOTE — Assessment & Plan Note (Signed)
Epi pen sent in. ?

## 2021-05-12 NOTE — Assessment & Plan Note (Signed)
Appears to be related to use of Albuterol, has improved with cutting back on use of this.  Refer to COPD plan of care for changes made to regimen.  Recent labs reassuring and EKG in office today reassuring.  Will obtain echo to further assess heart. ?

## 2021-05-12 NOTE — Assessment & Plan Note (Signed)
Chronic and ongoing -- continues to use Albuterol often, even with Symbicort on board.  Spirometry done 04/30/21.  At this time stop Symbicort and start Bevespi (LABA/LAMA) would benefit from this based on GOLD standards.  Goal is to reduce Albuterol use which causes dizziness. Recommend complete cessation of smoking. Discussed at length recommendations for PCV13.  Will place referral for CT Lung Screening, discussed with his wife and him.  CCM referral to assist with inhaler costs.  Return as scheduled upcoming. ?

## 2021-05-12 NOTE — Assessment & Plan Note (Signed)
Heavy smoker with recent LDL 80 and ASCVD 19.8%.  Will start Rosuvastatin 10 MG daily, educated his wife and him on this medication + side effects to report to provider.  Goal [place] to reduce risks.  Lipid panel in 8 weeks. ?

## 2021-05-12 NOTE — Assessment & Plan Note (Signed)
I have recommended complete cessation of tobacco use. I have discussed various options available for assistance with tobacco cessation including over the counter methods (Nicotine gum, patch and lozenges). We also discussed prescription options (Chantix, Nicotine Inhaler / Nasal Spray). The patient is not interested in pursuing any prescription tobacco cessation options at this time.  Spirometry next visit and will discuss lung screening, would benefit from this.  Lung CA screening referral placed. ? ?

## 2021-05-12 NOTE — Assessment & Plan Note (Signed)
BMI 33.54.  Recommended eating smaller high protein, low fat meals more frequently and exercising 30 mins a day 5 times a week with a goal of 10-15lb weight loss in the next 3 months. Patient voiced their understanding and motivation to adhere to these recommendations. ? ? ?

## 2021-05-12 NOTE — Progress Notes (Signed)
? ?BP 124/75   Pulse 71   Temp 98.9 ?F (37.2 ?C) (Oral)   Ht _0  (1.727 m)   Wt 220 lb 9.6 oz (100.1 kg)   SpO2 92%   BMI 33.54 kg/m?   ? ?Subjective:  ? ? Patient ID: Maurice Simpson, male    DOB: Nov 08, 1955, 66 y.o.   MRN: [id number] ? ?HPI: ?Maurice Simpson is a 66 y.o. male ? ?Chief Complaint  ?Patient presents with  ? Dizziness  ?  Patient is here to discuss dizziness he has been experiencing for about 2 weeks since taking prescription for Prednisone and inhalers. Patient states since he started the medication he felt the medication may have made it worse and stopped taking the Prednisone.  Patient states he has had a loss of taste since he has been using the inhaler. Patient states he think the inhalers are causing his symptoms of dizziness and discomfort of the episodes of sharp pains he has in the sternum area.   ? Medication Consultation  ?  Patient significant other is requesting an Epi-Pen for the patient at today's visit.   ? Medication Problem  ?  Patient states he feels better than he did yesterday every since he has backed off the Albuterol inhaler he notices he feels better and think that may be a factor into why he has not been feeling well.   ? ?Wife at bedside to assist with HPI and needs. ? ?The 10-year ASCVD risk score (Arnett DK, et al., 2019) is: 19.8% ?  Values used to calculate the score: ?    Age: 72 years ?    Sex: Male ?    Is Non-Hispanic African American: No ?    Diabetic: No ?    Tobacco smoker: Yes ?    Systolic Blood Pressure: 103 mmHg ?    Is BP treated: No ?    HDL Cholesterol: 26 mg/dL ?    Total Cholesterol: 142 mg/dL ? ?COPD ?Had exacerbation on 04/30/21 and was started on Symbicort & Albuterol + Zpack and Prednisone.  Feels like he has lost taste and had more dizziness with inhaler, mainly Albuterol inhaler which he is using twice a day. ? 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   ? ?Continues to smoke 1 1/2 PPD, has been smoking since age 5. ?COPD status:  uncontrolled ?Satisfied with current treatment?: no ?Oxygen use: no ?Dyspnea frequency: with exertion, long distance ?Cough frequency: occasional ?Rescue inhaler frequency:  3 times a day ?Limitation of activity: yes ?Productive cough: none ?Last Spirometry: 04/30/21 ?Pneumovax: Not up to Date ?Influenza: Up to Date  ? ?DIZZINESS ?Has had dizziness ongoing since 04/30/21 and this has worsened with using Albuterol -- feels he may have been using too much, about every 6 hours.  Has backed off Albuterol now and noticing less dizziness.  Recent labs overall reassuring. ?Duration: weeks ?Description of symptoms: lightheaded ?Duration of episode: minutes ?Dizziness frequency: recurrent ?Provoking factors:  using Albuterol inhaler ?Aggravating factors:  stopping Albuterol ?Triggered by rolling over in bed: no ?Triggered by bending over: no ?Aggravated by head movement: no ?Aggravated by exertion, coughing, loud noises: no ?Recent head injury: no ?Recent or current viral symptoms: yes ?History of vasovagal episodes: no ?Nausea: no ?Vomiting: no ?Tinnitus: no ?Hearing loss: no ?Aural fullness: no ?Headache: all the time due to eyes -- cataracts ?Photophobia/phonophobia: no ?Unsteady gait: no ?Postural instability: no ?Diplopia, dysarthria, dysphagia or weakness: no ?Related to exertion: no ?Pallor: no ?  Diaphoresis: no ?Dyspnea: yes -- with distances ?Chest pain:  shooting pain on occasion, one before he left house  when gets these last a couple minutes, no nausea or diaphoresis with this.  Was told in past by a provider he had "blockages" and would probably need help someday. ? ?Relevant past medical, surgical, family and social history reviewed and updated as indicated. Interim medical history since our last visit reviewed. ?Allergies and medications reviewed and updated. ? ?Review of Systems  ?Constitutional:  Negative for activity change, diaphoresis, fatigue and fever.  ?Respiratory:  Positive for cough, shortness of  breath and wheezing. Negative for chest tightness.   ?Cardiovascular:  Negative for chest pain, palpitations and leg swelling.  ?Gastrointestinal: Negative.   ?Neurological: Negative.   ?Psychiatric/Behavioral: Negative.    ? ?Per HPI unless specifically indicated above ? ?   ?Objective:  ?  ?BP 124/75   Pulse 71   Temp 98.9 ?F (37.2 ?C) (Oral)   Ht _0  (1.727 m)   Wt 220 lb 9.6 oz (100.1 kg)   SpO2 92%   BMI 33.54 kg/m?   ?Wt Readings from Last 3 Encounters:  ?05/12/21 220 lb 9.6 oz (100.1 kg)  ?05/04/21 224 lb (101.6 kg)  ?04/30/21 222 lb 12.8 oz (101.1 kg)  ?  ?Physical Exam ?Vitals and nursing note reviewed.  ?Constitutional:   ?   General: He is awake. He is not in acute distress. ?   Appearance: He is well-developed and well-groomed. He is obese. He is not ill-appearing.  ?HENT:  ?   Head: Normocephalic and atraumatic.  ?   Right Ear: Hearing normal. No drainage.  ?   Left Ear: Hearing normal. No drainage.  ?Eyes:  ?   General: Lids are normal.     ?   Right eye: No discharge.     ?   Left eye: No discharge.  ?   Conjunctiva/sclera: Conjunctivae normal.  ?   Pupils: Pupils are equal, round, and reactive to light.  ?Neck:  ?   Thyroid: No thyromegaly.  ?   Vascular: No carotid bruit.  ?   Trachea: Trachea normal.  ?Cardiovascular:  ?   Rate and Rhythm: Normal rate and regular rhythm.  ?   Heart sounds: Normal heart sounds, S1 normal and S2 normal. No murmur heard. ?  No gallop.  ?Pulmonary:  ?   Effort: Pulmonary effort is normal. No accessory muscle usage or respiratory distress.  ?   Breath sounds: Wheezing present.  ?   Comments: Expiratory wheezes noted throughout lung fields.  No SOB with talking. ?Abdominal:  ?   General: Bowel sounds are normal.  ?   Palpations: Abdomen is soft.  ?   Tenderness: There is no abdominal tenderness.  ?Musculoskeletal:     ?   General: Normal range of motion.  ?   Cervical back: Normal range of motion and neck supple.  ?   Right lower leg: No edema.  ?   Left lower  leg: No edema.  ?Lymphadenopathy:  ?   Cervical: No cervical adenopathy.  ?Skin: ?   General: Skin is warm and dry.  ?   Capillary Refill: Capillary refill takes less than 2 seconds.  ?   Findings: No rash.  ?Neurological:  ?   Mental Status: He is alert and oriented to person, place, and time.  ?   Deep Tendon Reflexes: Reflexes are normal and symmetric.  ?Psychiatric:     ?   Mood  and Affect: Mood normal.     ?   Behavior: Behavior normal. Behavior is cooperative.     ?   Thought Content: Thought content normal.     ?   Judgment: Judgment normal.  ? ?EKG ?My review and personal interpretation at Time: 1158   ?Indication: chest discomfort Rate: 76 Rhythm: sinus Axis: normal Other: No nonspecific st abn, no stemi, no lvh  ? ?Results for orders placed or performed in visit on 04/30/21  ?Comprehensive metabolic panel  ?Result Value Ref Range  ? Glucose 94 70 - 99 mg/dL  ? BUN 12 8 - 27 mg/dL  ? Creatinine, Ser 0.85 0.76 - 1.27 mg/dL  ? eGFR 96 >59 mL/min/1.73  ? BUN/Creatinine Ratio 14 10 - 24  ? Sodium 136 134 - 144 mmol/L  ? Potassium 4.2 3.5 - 5.2 mmol/L  ? Chloride 101 96 - 106 mmol/L  ? CO2 20 20 - 29 mmol/L  ? Calcium 9.4 8.6 - 10.2 mg/dL  ? Total Protein 7.2 6.0 - 8.5 g/dL  ? Albumin 4.3 3.8 - 4.8 g/dL  ? Globulin, Total 2.9 1.5 - 4.5 g/dL  ? Albumin/Globulin Ratio 1.5 1.2 - 2.2  ? Bilirubin Total 0.8 0.0 - 1.2 mg/dL  ? Alkaline Phosphatase 59 44 - 121 IU/L  ? AST 29 0 - 40 IU/L  ? ALT 21 0 - 44 IU/L  ?CBC with Differential/Platelet  ?Result Value Ref Range  ? WBC 8.7 3.4 - 10.8 x10E3/uL  ? RBC 5.83 (H) 4.14 - 5.80 x10E6/uL  ? Hemoglobin 18.0 (H) 13.0 - 17.7 g/dL  ? Hematocrit 53.3 (H) 37.5 - 51.0 %  ? MCV 91 79 - 97 fL  ? MCH 30.9 26.6 - 33.0 pg  ? MCHC 33.8 31.5 - 35.7 g/dL  ? RDW 13.0 11.6 - 15.4 %  ? Platelets 156 150 - 450 x10E3/uL  ? Neutrophils 55 Not Estab. %  ? Lymphs 33 Not Estab. %  ? Monocytes 7 Not Estab. %  ? Eos 4 Not Estab. %  ? Basos 1 Not Estab. %  ? Neutrophils Absolute 4.8 1.4 - 7.0 x10E3/uL   ? Lymphocytes Absolute 2.9 0.7 - 3.1 x10E3/uL  ? Monocytes Absolute 0.6 0.1 - 0.9 x10E3/uL  ? EOS (ABSOLUTE) 0.3 0.0 - 0.4 x10E3/uL  ? Basophils Absolute 0.1 0.0 - 0.2 x10E3/uL  ? Immature Granulocytes 0 Not Estab. %  ? Immature Grans (Ab

## 2021-05-13 NOTE — Telephone Encounter (Signed)
Pt returned call to schedule colonoscopy ?

## 2021-05-14 ENCOUNTER — Telehealth: Payer: Self-pay

## 2021-05-14 NOTE — Chronic Care Management (AMB) (Signed)
?  Chronic Care Management  ? ?Note ? ?05/14/2021 ?Name: Maurice Simpson MRN: [id number] DOB: [birth year] ? ?GIANCARLO ASKREN is a 66 y.o. year old male who is a primary care patient of [name], Barbaraann Faster, NP. I reached out to [name] by phone today in response to a referral sent by Mr. Jasmon Graffam [facility] PCP. ? ?Mr. Cali was given information about Chronic Care Management services today including:  ?CCM service includes personalized support from designated clinical staff supervised by his physician, including individualized plan of care and coordination with other care providers ?24/7 contact phone numbers for assistance for urgent and routine care needs. ?Service will only be billed when office clinical staff spend 20 minutes or more in a month to coordinate care. ?Only one practitioner may furnish and bill the service in a calendar month. ?The patient may stop CCM services at any time (effective at the end of the month) by phone call to the office staff. ?The patient is responsible for co-pay (up to 20% after annual deductible is met) if co-pay is required by the individual health plan.  ? ?Patient agreed to services and verbal consent obtained.  ? ?Follow up plan: ?Telephone appointment with care management team member scheduled for:06/29/2021 ? ?[name], RMA ?Care Guide, Embedded Care Coordination ?[facility]  ?[place], [place] [address] ?Direct Dial: [phone] ?[email or web address] ?Website: [email or web address]  ? ?

## 2021-05-18 ENCOUNTER — Ambulatory Visit: Payer: Self-pay

## 2021-05-18 NOTE — Telephone Encounter (Signed)
?  Chief Complaint: chest discomfort ?Symptoms: chest discomfort and tightness, overall not feeling good ?Frequency: 2-3 days  ?Pertinent Negatives: NA ?Disposition: [] ED /[] Urgent Care (no appt availability in office) / [x] Appointment(In office/virtual)/ []  [place] [facility]/ [] Home Care/ [] Refused Recommended Disposition /[] [facility] [facility]/ []  Follow-up with PCP ?Additional Notes: pt called in for wife. She was wanting to schedule an appt. Scheduled for 05/19/21 at 1000 with Malachy Mood NP. Pt's wife states he is waiting for CT of the heart to be done as well.  ? ?Reason for Disposition ? [1] Chest pain(s) lasting a few seconds from coughing AND [2] persists > 3 days ? ?Protocols used: Chest Pain-A-AH ? ?

## 2021-05-19 ENCOUNTER — Telehealth: Payer: Self-pay | Admitting: Nurse Practitioner

## 2021-05-19 ENCOUNTER — Ambulatory Visit: Payer: Medicare HMO | Admitting: Unknown Physician Specialty

## 2021-05-19 NOTE — Telephone Encounter (Signed)
Copied from [place] [phone]. Topic: General - Inquiry ?>> May 19, 2021  9:03 AM Robina Ade, Helene Kelp D wrote: ?Reason for CRM: Patient called and said that they are waiting on a schedule CT of his heart. He has not heard anything about it. Please call patient to talk about scheduling his appointment. ?

## 2021-05-21 ENCOUNTER — Other Ambulatory Visit: Payer: Self-pay | Admitting: *Deleted

## 2021-05-21 DIAGNOSIS — F17210 Nicotine dependence, cigarettes, uncomplicated: Secondary | ICD-10-CM

## 2021-05-21 DIAGNOSIS — Z122 Encounter for screening for malignant neoplasm of respiratory organs: Secondary | ICD-10-CM

## 2021-05-21 DIAGNOSIS — Z87891 Personal history of nicotine dependence: Secondary | ICD-10-CM

## 2021-05-21 NOTE — Telephone Encounter (Signed)
Spoke with patient ?Scheduled SD[facility] 05/29/21 8:30 ?CT scheduled 05/29/21 1:00 ?Pt voiced understanding and had no further questions.   ?

## 2021-05-29 ENCOUNTER — Ambulatory Visit
Admission: RE | Admit: 2021-05-29 | Discharge: 2021-05-29 | Disposition: A | Discharge status: Home / Self Care | Payer: Medicare HMO | Source: Ambulatory Visit | Attending: Acute Care | Admitting: Acute Care

## 2021-05-29 ENCOUNTER — Encounter: Payer: Self-pay | Admitting: Acute Care

## 2021-05-29 ENCOUNTER — Ambulatory Visit (INDEPENDENT_AMBULATORY_CARE_PROVIDER_SITE_OTHER): Payer: Medicare HMO | Admitting: Acute Care

## 2021-05-29 ENCOUNTER — Telehealth: Payer: Self-pay

## 2021-05-29 DIAGNOSIS — Z87891 Personal history of nicotine dependence: Secondary | ICD-10-CM | POA: Insufficient documentation

## 2021-05-29 DIAGNOSIS — F17210 Nicotine dependence, cigarettes, uncomplicated: Secondary | ICD-10-CM | POA: Diagnosis not present

## 2021-05-29 DIAGNOSIS — Z122 Encounter for screening for malignant neoplasm of respiratory organs: Secondary | ICD-10-CM | POA: Diagnosis not present

## 2021-05-29 NOTE — Telephone Encounter (Signed)
Pt's wife calling in to see if pt can get cardiology appt moved up from 06/30/21. I advised her that if pt was still having same symptoms of triage on 05/18/21 pt could go to ED to be seen. She states pt isn't going to ED for that and that she been f/up with our office to see about pt having imaging done on his heart. I advised her that Jolene, NP placed orders for echo on 05/12/21. I explained to her that she can call cardiology office and see if they can move up appt. She advised that she wanted the call to come from our office instead. I told her I would call and see what I could find out and would give her a call back. I called and spoke with Iris, FC about pt having orders for echocardiogram and thought that pt could go for this at any time since order is in place. She states that she was unsure and that she would have to check with referrals and I also explained that there wasn't a referral for cardiology placed, pt's appt on 06/30/21 is just for echo review. She spoke with Destiny, CMA and advised me they were going to coordinate with referrals and call the pt's wife back. I called and let pt's wife know of the information I found and explained someone would be giving her a call back. She verbalized understanding.

## 2021-05-29 NOTE — Progress Notes (Signed)
Virtual Visit via Telepone Note  I connected with Maurice Simpson on 11/25/20 at  2:00 PM EST by telephone and verified that I am speaking with the correct person using two identifiers.  Location: Patient: Home Provider: Working from home   I discussed the limitations, risks, security and privacy concerns of performing an evaluation and management service by telephone and the availability of in person appointments. I also discussed with the patient that there may be a patient responsible charge related to this service. The patient expressed understanding and agreed to proceed.  Shared Decision Making Visit Lung Cancer Screening Program [phone])   Eligibility: Age 66 y.o. Pack Years Smoking History Calculation 100 (# packs/per year x # years smoked) Recent History of coughing up blood  no Unexplained weight loss? no ( >Than 15 pounds within the last 6 months ) Prior History Lung / other cancer no (Diagnosis within the last 5 years already requiring surveillance chest CT Scans). Smoking Status Current Smoker Former Smokers: Years since quit: NA  Quit Date: NA  Visit Components: Discussion included one or more decision making aids. yes Discussion included risk/benefits of screening. yes Discussion included potential follow up diagnostic testing for abnormal scans. yes Discussion included meaning and risk of over diagnosis. yes Discussion included meaning and risk of False Positives. yes Discussion included meaning of total radiation exposure. yes  Counseling Included: Importance of adherence to annual lung cancer LDCT screening. yes Impact of comorbidities on ability to participate in the program. yes Ability and willingness to under diagnostic treatment. yes  Smoking Cessation Counseling: Current Smokers:  Discussed importance of smoking cessation. yes Information about tobacco cessation classes and interventions provided to patient. yes Patient provided with "ticket" for LDCT  Scan. yes Symptomatic Patient. yes  Counseling(Intermediate counseling: > three minutes) [address] Diagnosis Code: Tobacco Use Z72.0 Asymptomatic Patient no  Counseling NA Former Smokers:  Discussed the importance of maintaining cigarette abstinence. yes Diagnosis Code: Personal History of Nicotine Dependence. Y09.983 Information about tobacco cessation classes and interventions provided to patient. Yes Patient provided with "ticket" for LDCT Scan. yes Written Order for Lung Cancer Screening with LDCT placed in Epic. Yes (CT Chest Lung Cancer Screening Low Dose W/O CM) [id number] Z12.2-Screening of respiratory organs Z87.891-Personal history of nicotine dependence   I spent 25 minutes of face to face time with him discussing the risks and benefits of lung cancer screening. We viewed a power point together that explained in detail the above noted topics. We took the time to pause the power point at intervals to allow for questions to be asked and answered to ensure understanding. We discussed that he had taken the single most powerful action possible to decrease his risk of developing lung cancer when he quit smoking. I counseled him to remain smoke free, and to contact me if he ever had the desire to smoke again so that I can provide resources and tools to help support the effort to remain smoke free. We discussed the time and location of the scan, and that either  [name] or I will call with the results within  24-48 hours of receiving them. He has my card and contact information in the event he needs to speak with me, in addition to a copy of the power point we reviewed as a resource. He verbalized understanding of all of the above and had no further questions upon leaving the office.     I explained to the patient that there has been a  high incidence of coronary artery disease noted on these exams. I explained that this is a non-gated exam therefore degree or severity cannot be determined.  This patient is on statin therapy. I have asked the patient to follow-up with their PCP regarding any incidental finding of coronary artery disease and management with diet or medication as they feel is clinically indicated. The patient verbalized understanding of the above and had no further questions.   I spent 3 minutes counseling on smoking cessation and the health risks of continued tobacco abuse    Maurice Simpson King[place], NP-[facility] [address] [facility] Personal contact information can be found on Amion  05/29/2021, 8:43 AM

## 2021-05-29 NOTE — Patient Instructions (Signed)
Thank you for participating in the [facility] Lung Cancer Screening Program. It was our pleasure to meet you today. We will call you with the results of your scan within the next few days. Your scan will be assigned a Lung RADS category score by the physicians reading the scans.  This Lung RADS score determines follow up scanning.  See below for description of categories, and follow up screening recommendations. We will be in touch to schedule your follow up screening annually or based on recommendations of our providers. We will fax a copy of your scan results to your Primary Care Physician, or the physician who referred you to the program, to ensure they have the results. Please call the office if you have any questions or concerns regarding your scanning experience or results.  Our office number is [phone]. Please speak with Denise Phelps, RN. , or  Denise Buckner RN, They are  our Lung Cancer Screening RN.'s If They are unavailable when you call, Please leave a message on the voice mail. We will return your call at our earliest convenience.This voice mail is monitored several times a day.  Remember, if your scan is normal, we will scan you annually as long as you continue to meet the criteria for the program. (Age 55-77, Current smoker or smoker who has quit within the last 15 years). If you are a smoker, remember, quitting is the single most powerful action that you can take to decrease your risk of lung cancer and other pulmonary, breathing related problems. We know quitting is hard, and we are here to help.  Please let us know if there is anything we can do to help you meet your goal of quitting. If you are a former smoker, congratulations. We are proud of you! Remain smoke free! Remember you can refer friends or family members through the number above.  We will screen them to make sure they meet criteria for the program. Thank you for helping us take better care of you by  participating in Lung Screening.  You can receive free nicotine replacement therapy ( patches, gum or mints) by calling 1-800-QUIT NOW. Please call so we can get you on the path to becoming  a non-smoker. I know it is hard, but you can do this!  Lung RADS Categories:  Lung RADS 1: no nodules or definitely non-concerning nodules.  Recommendation is for a repeat annual scan in 12 months.  Lung RADS 2:  nodules that are non-concerning in appearance and behavior with a very low likelihood of becoming an active cancer. Recommendation is for a repeat annual scan in 12 months.  Lung RADS 3: nodules that are probably non-concerning , includes nodules with a low likelihood of becoming an active cancer.  Recommendation is for a 6-month repeat screening scan. Often noted after an upper respiratory illness. We will be in touch to make sure you have no questions, and to schedule your 6-month scan.  Lung RADS 4 A: nodules with concerning findings, recommendation is most often for a follow up scan in 3 months or additional testing based on our provider's assessment of the scan. We will be in touch to make sure you have no questions and to schedule the recommended 3 month follow up scan.  Lung RADS 4 B:  indicates findings that are concerning. We will be in touch with you to schedule additional diagnostic testing based on our provider's  assessment of the scan.  Other options for assistance in smoking cessation (   As covered by your insurance benefits)  Hypnosis for smoking cessation  [employer] [phone]  Acupuncture for smoking cessation  [facility] [phone]   

## 2021-06-01 ENCOUNTER — Telehealth: Payer: Self-pay | Admitting: Nurse Practitioner

## 2021-06-01 ENCOUNTER — Telehealth: Payer: Self-pay | Admitting: Acute Care

## 2021-06-01 DIAGNOSIS — F17210 Nicotine dependence, cigarettes, uncomplicated: Secondary | ICD-10-CM

## 2021-06-01 DIAGNOSIS — R911 Solitary pulmonary nodule: Secondary | ICD-10-CM

## 2021-06-01 NOTE — Telephone Encounter (Signed)
Received call report from [facility] with [facility] on patient's lung cancer CT done on 05/29/2021. Sarah, please review the result/impression copied below:  IMPRESSION: 1. Lung-RADS 4X, highly suspicious. Additional imaging evaluation or consultation with Pulmonology or Thoracic Surgery recommended. Mass centered at the origin of the right lower lobe bronchus, causing significant compression of the right lower lobe bronchus and mild mass-effect upon the right lower lobe bronchus. 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 2. Bilateral adrenal masses, highly suspicious for metastatic disease. 3. Mild likely postobstructive pneumonitis and volume loss in the right middle lobe. 4. Aortic atherosclerosis (ICD10-I70.0), coronary artery atherosclerosis and emphysema (ICD10-J43.9). 5. Cholelithiasis 6. Bilateral gynecomastia.  Please advise, thank you.

## 2021-06-01 NOTE — Telephone Encounter (Signed)
Copied from [place] [id number]. Topic: General - Other >> Jun 01, 2021 11:31 AM Bayard Beaver wrote: Reason for CRM:Pts wife called in states an echocardiogram was supposed to be scheduled for pt. By June 20. Please call back

## 2021-06-01 NOTE — Telephone Encounter (Signed)
Attempted to contact pt. Unable to leave a message on pt's voicemail. Called number listed for pt's wife ( DPR) and left message for her to have pt to call regarding recent lung screening CT results.  Per Dr Valeta Harms , pt will need PET scan asap and then ov to see him on June 1 or June 2 after the PET.  Will await pt call back before I put in order for PET scan.

## 2021-06-02 NOTE — Telephone Encounter (Signed)
Spoke with pt and his wife (DPR) and reviewed CT scan results . I let them know that is some lung nodule activity seen on this recent chest CT as well as a nodule on the adrenal gland and that both of these need to be looked at closer with a PET scan. Patient and his wife verbalized understanding. PET scan order placed and will make appt to see Pulmonlogist to follow up once PET has been done. CT results faxed to PCP with follow up plans included.

## 2021-06-05 ENCOUNTER — Ambulatory Visit: Payer: Medicare HMO

## 2021-06-05 ENCOUNTER — Institutional Professional Consult (permissible substitution): Payer: Medicare HMO | Admitting: Pulmonary Disease

## 2021-06-09 ENCOUNTER — Institutional Professional Consult (permissible substitution): Payer: Medicare HMO | Admitting: Internal Medicine

## 2021-06-15 ENCOUNTER — Ambulatory Visit
Admission: RE | Admit: 2021-06-15 | Discharge: 2021-06-15 | Disposition: A | Discharge status: Home / Self Care | Payer: Medicare HMO | Source: Ambulatory Visit | Attending: Acute Care | Admitting: Acute Care

## 2021-06-15 DIAGNOSIS — K5710 Diverticulosis of small intestine without perforation or abscess without bleeding: Secondary | ICD-10-CM | POA: Diagnosis not present

## 2021-06-15 DIAGNOSIS — K8020 Calculus of gallbladder without cholecystitis without obstruction: Secondary | ICD-10-CM | POA: Insufficient documentation

## 2021-06-15 DIAGNOSIS — R911 Solitary pulmonary nodule: Secondary | ICD-10-CM

## 2021-06-15 DIAGNOSIS — R918 Other nonspecific abnormal finding of lung field: Secondary | ICD-10-CM | POA: Diagnosis not present

## 2021-06-15 DIAGNOSIS — F17210 Nicotine dependence, cigarettes, uncomplicated: Secondary | ICD-10-CM | POA: Insufficient documentation

## 2021-06-15 LAB — GLUCOSE, CAPILLARY: Glucose-Capillary: 94 mg/dL (ref 70–99)

## 2021-06-15 MED ORDER — FLUDEOXYGLUCOSE F - 18 (FDG) INJECTION
11.4000 | Freq: Once | INTRAVENOUS | Status: AC | PRN
  Administered 2021-06-15: 12.35 via INTRAVENOUS

## 2021-06-16 ENCOUNTER — Institutional Professional Consult (permissible substitution): Payer: Medicare HMO | Admitting: Pulmonary Disease

## 2021-06-17 ENCOUNTER — Ambulatory Visit
Admission: RE | Admit: 2021-06-17 | Discharge: 2021-06-17 | Disposition: A | Discharge status: Home / Self Care | Payer: Medicare HMO | Source: Ambulatory Visit | Attending: Nurse Practitioner | Admitting: Nurse Practitioner

## 2021-06-17 DIAGNOSIS — R0789 Other chest pain: Secondary | ICD-10-CM | POA: Insufficient documentation

## 2021-06-17 DIAGNOSIS — F17200 Nicotine dependence, unspecified, uncomplicated: Secondary | ICD-10-CM | POA: Diagnosis not present

## 2021-06-17 LAB — ECHOCARDIOGRAM COMPLETE
AR max vel: 2.93 cm2
AV Area VTI: 3.3 cm2
AV Area mean vel: 2.88 cm2
AV Mean grad: 2 mmHg
AV Peak grad: 3.1 mmHg
Ao pk vel: 0.88 m/s
Area-P 1/2: 3.87 cm2
MV VTI: 2.45 cm2
S' Lateral: 2.9 cm

## 2021-06-17 NOTE — Progress Notes (Signed)
*  PRELIMINARY RESULTS* Echocardiogram 2D Echocardiogram has been performed.  Sherrie Sport 06/17/2021, 10:22 AM

## 2021-06-18 ENCOUNTER — Ambulatory Visit: Payer: Self-pay | Admitting: *Deleted

## 2021-06-18 NOTE — Telephone Encounter (Signed)
  Chief Complaint: wife called in regarding the PET scan results she saw in [place].   Asking Jolene [name] for help interpreting what it means. Symptoms: Husband still not feeling well Frequency: N/A Pertinent Negatives: Patient denies N/A Disposition: [] ED /[] Urgent Care (no appt availability in office) / [] Appointment(In office/virtual)/ []  [facility]/ [] Home Care/ [] Refused Recommended Disposition /[] [facility] Mobile Bus/ [x]  Follow-up with PCP Additional Notes: Message sent to [facility].   Wife can be reached at [phone].

## 2021-06-18 NOTE — Progress Notes (Signed)
Good morning crew, please call Maurice Simpson and his wife to alert them that his echo has returned.  Overall pump of heart appears stable, there is some mild impairment of relaxation of heart we will continue to monitor.  I did review recent records and upcoming appointments, I do highly recommend they reschedule with pulmonary ASAP due to recent PET scan results so they can further review and discuss with him and I would also recommend a referral to GI for colonoscopy.  If any questions please let me know.  If they would like GI referral let me know and I will place.  Thank you. Keep being stellar!!  Thank you for allowing me to participate in your care.  I appreciate you. Kindest regards, Kaeli Nichelson

## 2021-06-18 NOTE — Telephone Encounter (Signed)
Reason for Disposition  [1] Follow-up call to recent contact AND [2] information only call, no triage required  Answer Assessment - Initial Assessment Questions 1. REASON FOR CALL or QUESTION: "What is your reason for calling today?" or "How can I best help you?" or "What question do you have that I can help answer?"     Wife calling in for PET scan and U/S results. Husband has not seen the pulmonologist yet but the PET scan has been done and she sees the result in MyChart but doesn't understand it.   Asking if Jolene could call her back for the results.     Cardiology ordered the echocardiogram so she is going to call that office for the results.  Protocols used: Information Only Call - No Triage-A-AH

## 2021-06-18 NOTE — Telephone Encounter (Signed)
See result note.  

## 2021-06-21 NOTE — Patient Instructions (Signed)

## 2021-06-22 ENCOUNTER — Ambulatory Visit: Payer: Self-pay

## 2021-06-22 NOTE — Telephone Encounter (Signed)
  Chief Complaint: Swimmy headed - getting worse Symptoms: Swimmy headed - no appetite Frequency: ongoing Pertinent Negatives: Patient denies  Disposition: [] ED /[x] Urgent Care (no appt availability in office) / [] Appointment(In office/virtual)/ []  [facility]/ [] Home Care/ [] Refused Recommended Disposition /[] [facility]/ []  Follow-up with PCP Additional Notes: Pt has had ongoing swimmy headed, and no appetite. Pt would like a call back when possible. Pt refused UC.   Reason for Disposition  [1] MODERATE dizziness (e.g., interferes with normal activities) AND [2] has been evaluated by physician for this  Answer Assessment - Initial Assessment Questions 1. DESCRIPTION: "Describe your dizziness."     Swimmy headed 2. LIGHTHEADED: "Do you feel lightheaded?" (e.g., somewhat faint, woozy, weak upon standing)     lightheaded 3. VERTIGO: "Do you feel like either you or the room is spinning or tilting?" (i.e. vertigo)      4. SEVERITY: "How bad is it?"  "Do you feel like you are going to faint?" "Can you stand and walk?"   - MILD: Feels slightly dizzy, but walking normally.   - MODERATE: Feels unsteady when walking, but not falling; interferes with normal activities (e.g., school, work).   - SEVERE: Unable to walk without falling, or requires assistance to walk without falling; feels like passing out now.       5. ONSET:  "When did the dizziness begin?"      6. AGGRAVATING FACTORS: "Does anything make it worse?" (e.g., standing, change in head position)      7. HEART RATE: "Can you tell me your heart rate?" "How many beats in 15 seconds?"  (Note: not all patients can do this)        8. CAUSE: "What do you think is causing the dizziness?"      9. RECURRENT SYMPTOM: "Have you had dizziness before?" If Yes, ask: "When was the last time?" "What happened that time?"     Ongoing 10. OTHER SYMPTOMS: "Do you have any other symptoms?" (e.g., fever, chest pain, vomiting,  diarrhea, bleeding)       No appetite 11. PREGNANCY: "Is there any chance you are pregnant?" "When was your last menstrual period?"       na  Protocols used: Dizziness - Lightheadedness-A-AH

## 2021-06-24 ENCOUNTER — Encounter: Payer: Self-pay | Admitting: Nurse Practitioner

## 2021-06-24 ENCOUNTER — Ambulatory Visit (INDEPENDENT_AMBULATORY_CARE_PROVIDER_SITE_OTHER): Payer: Medicare HMO | Admitting: Nurse Practitioner

## 2021-06-24 VITALS — BP 121/75 | HR 67 | Temp 97.6°F | Ht 68.0 in | Wt 212.6 lb

## 2021-06-24 DIAGNOSIS — R634 Abnormal weight loss: Secondary | ICD-10-CM | POA: Diagnosis not present

## 2021-06-24 DIAGNOSIS — H938X3 Other specified disorders of ear, bilateral: Secondary | ICD-10-CM

## 2021-06-24 DIAGNOSIS — F17210 Nicotine dependence, cigarettes, uncomplicated: Secondary | ICD-10-CM | POA: Diagnosis not present

## 2021-06-24 DIAGNOSIS — R918 Other nonspecific abnormal finding of lung field: Secondary | ICD-10-CM | POA: Diagnosis not present

## 2021-06-24 MED ORDER — LORATADINE 10 MG PO TABS: 10.0000 mg | ORAL_TABLET | Freq: Every day | ORAL | 11 refills | Status: DC

## 2021-06-24 MED ORDER — ONDANSETRON HCL 4 MG PO TABS: 4.0000 mg | ORAL_TABLET | Freq: Three times a day (TID) | ORAL | 3 refills | Status: DC | PRN

## 2021-06-24 NOTE — Assessment & Plan Note (Signed)
I have recommended complete cessation of tobacco use. I have discussed various options available for assistance with tobacco cessation including over the counter methods (Nicotine gum, patch and lozenges). We also discussed prescription options (Chantix, Nicotine Inhaler / Nasal Spray). The patient is not interested in pursuing any prescription tobacco cessation options at this time.  

## 2021-06-24 NOTE — Assessment & Plan Note (Addendum)
Noted on initial lung cancer screening 05/29/21 and on PET 06/15/21 was noted to be consistent with malignancy and mets present.  Discussed scan at length today with patient and his wife, they are scheduled to see pulmonary next week for further discussion.  They are aware of consistency with cancerous findings - he is higher risk.  All questions answered. - At this time will also place referral to oncology and referral to GI as possible mets noted to colon and he has never had a colonoscopy.   - Weight loss present, 12 pounds since April, with decreased appetite, energy, and nausea -- order for Zofran sent and recommend starting protein shakes TID (Ensure).

## 2021-06-24 NOTE — Assessment & Plan Note (Signed)
No s/s infection on exam, bilateral effusion noted.  Will start Claritin 10 MG daily, as suspect more allergy related.

## 2021-06-24 NOTE — Assessment & Plan Note (Signed)
12 pounds since April, refer to lung mass plan of care.

## 2021-06-24 NOTE — Progress Notes (Signed)
BP 121/75   Pulse 67   Temp 97.6 F (36.4 C) (Oral)   Ht 5\' 8"  (1.727 m)   Wt 212 lb 9.6 oz (96.4 kg)   SpO2 91%   BMI 32.33 kg/m    Subjective:    Patient ID: Maurice Simpson, male    DOB: Apr 21, 1955, 66 y.o.   MRN: [id number]  HPI: Maurice Simpson is a 66 y.o. male  Chief Complaint  Patient presents with   COPD    Patient is here for a 7 week follow up. Patient states the inhalers feel better than did.    Nausea   Anorexia    Patient significant other says he has no appetite and no energy. Patient has been dealing with this issue for the past 3 weeks.    COPD Last visit on 05/12/21 started on Bevespi and stopped Symbicort -- he reports no difference really noted.  Placed referral to CCM team to discuss inhaler cost assist.  He was agreeable to initial lung cancer screening last visit and went for this on 05/29/21, this returned noting a mass to right lower lobe causing compression.  He was subsequently sent for PET scan and was to see pulmonary 5/30 and then 6/6 -- but missed these visits and has not heard about results as of yet.  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  Has family history of colon cancer (cousin), but has not obtained colonoscopy as recommended.  Scheduled to see pulmonary on 07/02/21.  Continues to smoke, about 2 PPD.  He does endorse lower energy level and appetite over past weeks.  Has been a little nauseated, eating one meal at day at this time.  No supplements.  No blood in stool, hemoptysis.  Does endorse at times will have diarrhea 4-5 times a day over past 2 weeks.  Has been feeling more "swimmy" headed and off kilter + continues to have some chest discomfort on occasion COPD status: uncontrolled Satisfied with current treatment?: yes Oxygen use: no Dyspnea frequency: at baseline Cough frequency: at baseline Rescue inhaler frequency:  not using Limitation of  activity: no Productive cough: none Last Spirometry: 04/30/21 Pneumovax:  wishes to think about this Influenza:  refuses    Relevant past medical, surgical, family and social history reviewed and updated as indicated. Interim medical history since our last visit reviewed. Allergies and medications reviewed and updated.  Review of Systems  Constitutional:  Positive for appetite change, fatigue and unexpected weight change. Negative for activity change, chills and fever.  Respiratory:  Positive for cough and shortness of breath. Negative for chest tightness and wheezing.   Cardiovascular:  Negative for chest pain, palpitations and leg swelling.  Gastrointestinal:  Positive for diarrhea and nausea. Negative for abdominal distention, abdominal pain, blood in stool, constipation and vomiting.  Neurological:  Positive for light-headedness (occasional swimmy feeling). Negative for dizziness, seizures, syncope, weakness, numbness and headaches.  Psychiatric/Behavioral: Negative.     Per HPI unless specifically indicated above     Objective:    BP 121/75   Pulse 67   Temp 97.6 F (36.4 C) (Oral)   Ht 5\' 8"  (1.727 m)   Wt 212 lb 9.6 oz (96.4 kg)   SpO2 91%   BMI 32.33 kg/m   Wt Readings from Last 3 Encounters:  06/24/21 212 lb 9.6 oz (96.4 kg)  05/12/21 220 lb 9.6 oz (100.1 kg)  05/04/21 224 lb (101.6 kg)    Physical Exam Vitals and nursing note reviewed.  Constitutional:      General: He is awake. He is not in acute distress.    Appearance: He is well-developed and well-groomed. He is obese. He is not ill-appearing.  HENT:     Head: Normocephalic and atraumatic.     Right Ear: Hearing, ear canal and external ear normal. No drainage. A middle ear effusion is present.     Left Ear: Hearing, ear canal and external ear normal. No drainage. A middle ear effusion is present.  Eyes:     General: Lids are normal.        Right eye: No discharge.        Left eye: No discharge.      Conjunctiva/sclera: Conjunctivae normal.     Pupils: Pupils are equal, round, and reactive to light.  Neck:     Thyroid: No thyromegaly.     Vascular: No carotid bruit.     Trachea: Trachea normal.  Cardiovascular:     Rate and Rhythm: Normal rate and regular rhythm.     Heart sounds: Normal heart sounds, S1 normal and S2 normal. No murmur heard.    No gallop.  Pulmonary:     Effort: Pulmonary effort is normal. No accessory muscle usage or respiratory distress.     Breath sounds: Wheezing present.     Comments: Expiratory wheezes noted throughout lung fields.  No SOB with talking. Abdominal:     General: Bowel sounds are normal.     Palpations: Abdomen is soft.     Tenderness: There is no abdominal tenderness.  Musculoskeletal:        General: Normal range of motion.     Cervical back: Normal range of motion and neck supple.     Right lower leg: No edema.     Left lower leg: No edema.  Lymphadenopathy:     Cervical: No cervical adenopathy.  Skin:    General: Skin is warm and dry.     Capillary Refill: Capillary refill takes less than 2 seconds.     Findings: No rash.  Neurological:     Mental Status: He is alert and oriented to person, place, and time.     Deep Tendon Reflexes: Reflexes are normal and symmetric.  Psychiatric:        Mood and Affect: Mood normal.        Behavior: Behavior normal. Behavior is cooperative.        Thought Content: Thought content normal.        Judgment: Judgment normal.    Results for orders placed or performed during the hospital encounter of 06/17/21  ECHOCARDIOGRAM COMPLETE  Result Value Ref Range   Ao pk vel 0.88 m/s   AV Area VTI 3.30 cm2   AR max vel 2.93 cm2   AV Mean grad 2.0 mmHg   AV Peak grad 3.1 mmHg   S' Lateral 2.90 cm   AV Area mean vel 2.88 cm2   Area-P 1/2 3.87 cm2   MV VTI 2.45 cm2      Assessment & Plan:   Problem List Items Addressed This Visit       Other   Ear pressure, bilateral    No s/s infection on  exam, bilateral effusion noted.  Will start Claritin 10 MG daily, as suspect more allergy related.        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  Discussed scan at length today with patient and his wife, they are scheduled to see pulmonary next week for further discussion.  They are aware of consistency with cancerous findings - he is higher risk.  All questions answered. - At this time will also place referral to oncology and referral to GI as possible mets noted to colon and he has never had a colonoscopy.   - Weight loss present, 12 pounds since April, with decreased appetite, energy, and nausea -- order for Zofran sent and recommend starting protein shakes TID (Ensure).        Relevant Orders   Ambulatory referral to Hematology / Oncology   Ambulatory referral to Gastroenterology   Nicotine dependence, cigarettes, uncomplicated    I have recommended complete cessation of tobacco use. I have discussed various options available for assistance with tobacco cessation including over the counter methods (Nicotine gum, patch and lozenges). We also discussed prescription options (Chantix, Nicotine Inhaler / Nasal Spray). The patient is not interested in pursuing any prescription tobacco cessation options at this time.      Weight loss, abnormal    12 pounds since April, refer to lung mass plan of care.       Time: 25 minutes, >50% spent counseling/or care coordination   Follow up plan: Return in about 3 months (around 09/24/2021).

## 2021-06-26 ENCOUNTER — Ambulatory Visit: Payer: Medicare HMO | Admitting: Oncology

## 2021-06-26 ENCOUNTER — Other Ambulatory Visit: Payer: Medicare HMO

## 2021-06-29 ENCOUNTER — Telehealth: Payer: Self-pay

## 2021-06-29 ENCOUNTER — Telehealth: Payer: Medicare HMO

## 2021-06-29 NOTE — Telephone Encounter (Signed)
  Care Management   Follow Up Note   06/29/2021 Name: Maurice Simpson MRN: [id number] DOB: 09/13/1955   Referred by: [name], NP Reason for referral : Chronic Care Management   An unsuccessful telephone outreach was attempted today. The patient was referred to the case management team for assistance with care management and care coordination.   Follow Up Plan:  No answer to initial ccm telephone visit 06/29/21. Unable to LVM d/t VM not set up. Will send to care guide to have RS  Madelin Rear, PharmD, [facility] Clinical Pharmacist  [facility]  [phone]

## 2021-06-29 NOTE — Progress Notes (Cosign Needed)
    Chronic Care Management Pharmacy Assistant   Name: Maurice Simpson  MRN: [id number] DOB: 03/14/1955  Maurice Simpson is an 66 y.o. year old male who presents for his initial CCM visit with the clinical pharmacist.   Recent office visits:  06/24/21 [name], NP (COPD, Nausea, anorexia) Orders: Ambulatory referral to Hematology / Oncology and Ambulatory referral to Gastroenterology; Medication changes: loratadine (CLARITIN) 10 MG tablet, ondansetron (ZOFRAN) 4 MG tablet  05/12/21 [name], [name], NP (Dizziness, med consultation,) Orders: ECHOCARDIOGRAM COMPLETE, EKG, AMB Referral to [facility], Ambulatory Referral for Lung Cancer Scre; Medication changes: rosuvastatin (CRESTOR) 10 MG tablet, Glycopyrrolate-Formoterol (BEVESPI AEROSPHERE) 9-4.8 MCG/ACT AERO, EPINEPHrine 0.3 mg/0.3 mL IJ SOAJ injection, Discontinued budesonide-formoterol (SYMBICORT) 160-4.5 MCG/ACT inhaler by provider, and prednisone 10 mg by the patient  05/04/21 [name], NP (Pre-op exam, cough) Orders: Ambulatory referral to Gastroenterology; Medication changes: none  04/30/21 [name], DO-Family Medicine (COPD) Blood work orderd; Medication changes: albuterol (VENTOLIN HFA) 108 (90 Base) MCG/ACT inhaler, azithromycin (ZITHROMAX) 250 MG tablet, budesonide-formoterol (SYMBICORT) 160-4.5 MCG/ACT inhaler, predniSONE (DELTASONE) 10 MG tablet  Recent consult visits:  05/29/21 [name], NP-Pulmonary Disease (Cigarette smoker) No orders or medication changes  Hospital visits:  None in previous 6 months  Medications: Outpatient Encounter Medications as of 06/29/2021  Medication Sig   albuterol (VENTOLIN HFA) 108 (90 Base) MCG/ACT inhaler Inhale 2 puffs into the lungs every 6 (six) hours as needed for wheezing or shortness of breath.   EPINEPHrine 0.3 mg/0.3 mL IJ SOAJ injection Inject 0.3 mg into the muscle as needed for anaphylaxis.   Glycopyrrolate-Formoterol (BEVESPI AEROSPHERE) 9-4.8  MCG/ACT AERO Inhale 2 puffs into the lungs daily.   loratadine (CLARITIN) 10 MG tablet Take 1 tablet (10 mg total) by mouth daily.   ondansetron (ZOFRAN) 4 MG tablet Take 1 tablet (4 mg total) by mouth every 8 (eight) hours as needed for nausea or vomiting.   rosuvastatin (CRESTOR) 10 MG tablet Take 1 tablet (10 mg total) by mouth daily.   No facility-administered encounter medications on file as of 06/29/2021.   Medication List: albuterol (VENTOLIN HFA) 108 (90 Base) MCG/ACT inhaler,-last filled 04/30/21 25 ds EPINEPHrine 0.3 mg/0.3 mL IJ SOAJ injection-last filled 05/12/21  loratadine (CLARITIN) 10 MG tablet-last filled 06/24/21 90 ds ondansetron (ZOFRAN) 4 MG tablet-last filled 06/24/21 20 ds rosuvastatin (CRESTOR) 10 MG tablet-last fill 05/12/21 90 ds  Care Gaps: Colonoscopy-NA Diabetic Foot Exam-NA Ophthalmology-04/29/21 Dexa Scan - NA Annual Well Visit - NA Micro albumin-NA Hemoglobin A1c- NA  Star Rating Drugs: Rosuvastatin 10 mg-last fill 05/12/21 90 ds  Ethelene Hal Clinical Pharmacist Assistant [phone]

## 2021-06-30 ENCOUNTER — Other Ambulatory Visit: Payer: Medicare HMO

## 2021-07-01 ENCOUNTER — Encounter: Payer: Self-pay | Admitting: *Deleted

## 2021-07-01 ENCOUNTER — Other Ambulatory Visit: Payer: Self-pay | Admitting: *Deleted

## 2021-07-01 ENCOUNTER — Inpatient Hospital Stay: Payer: Medicare HMO

## 2021-07-01 ENCOUNTER — Inpatient Hospital Stay: Payer: Medicare HMO | Attending: Oncology | Admitting: Oncology

## 2021-07-01 ENCOUNTER — Encounter: Payer: Self-pay | Admitting: Oncology

## 2021-07-01 VITALS — BP 128/72 | HR 79 | Temp 98.1°F | Resp 18 | Ht 68.0 in | Wt 215.0 lb

## 2021-07-01 DIAGNOSIS — F17210 Nicotine dependence, cigarettes, uncomplicated: Secondary | ICD-10-CM | POA: Diagnosis not present

## 2021-07-01 DIAGNOSIS — R918 Other nonspecific abnormal finding of lung field: Secondary | ICD-10-CM

## 2021-07-01 DIAGNOSIS — K668 Other specified disorders of peritoneum: Secondary | ICD-10-CM | POA: Diagnosis not present

## 2021-07-01 DIAGNOSIS — R42 Dizziness and giddiness: Secondary | ICD-10-CM | POA: Insufficient documentation

## 2021-07-01 DIAGNOSIS — Z79899 Other long term (current) drug therapy: Secondary | ICD-10-CM | POA: Diagnosis not present

## 2021-07-01 DIAGNOSIS — E279 Disorder of adrenal gland, unspecified: Secondary | ICD-10-CM | POA: Insufficient documentation

## 2021-07-01 DIAGNOSIS — G893 Neoplasm related pain (acute) (chronic): Secondary | ICD-10-CM | POA: Diagnosis not present

## 2021-07-01 DIAGNOSIS — R110 Nausea: Secondary | ICD-10-CM | POA: Diagnosis not present

## 2021-07-01 LAB — COMPREHENSIVE METABOLIC PANEL
ALT: 17 U/L (ref 0–44)
AST: 32 U/L (ref 15–41)
Albumin: 3.8 g/dL (ref 3.5–5.0)
Alkaline Phosphatase: 50 U/L (ref 38–126)
Anion gap: 9 (ref 5–15)
BUN: 10 mg/dL (ref 8–23)
CO2: 26 mmol/L (ref 22–32)
Calcium: 9 mg/dL (ref 8.9–10.3)
Chloride: 100 mmol/L (ref 98–111)
Creatinine, Ser: 1.01 mg/dL (ref 0.61–1.24)
GFR, Estimated: >60 mL/min (ref >60)
Glucose, Bld: 108 mg/dL — ABNORMAL HIGH (ref 70–99)
Potassium: 3.8 mmol/L (ref 3.5–5.1)
Sodium: 135 mmol/L (ref 135–145)
Total Bilirubin: 1 mg/dL (ref 0.3–1.2)
Total Protein: 7.5 g/dL (ref 6.5–8.1)

## 2021-07-01 LAB — CBC WITH DIFFERENTIAL/PLATELET
Abs Immature Granulocytes: 0.03 10*3/uL (ref 0.00–0.07)
Basophils Absolute: 0.1 10*3/uL (ref 0.0–0.1)
Basophils Relative: 1 %
Eosinophils Absolute: 0.3 10*3/uL (ref 0.0–0.5)
Eosinophils Relative: 4 %
HCT: 50.1 % (ref 39.0–52.0)
Hemoglobin: 17.5 g/dL — ABNORMAL HIGH (ref 13.0–17.0)
Immature Granulocytes: 0 %
Lymphocytes Relative: 28 %
Lymphs Abs: 2.6 10*3/uL (ref 0.7–4.0)
MCH: 31.7 pg (ref 26.0–34.0)
MCHC: 34.9 g/dL (ref 30.0–36.0)
MCV: 90.8 fL (ref 80.0–100.0)
Monocytes Absolute: 0.7 10*3/uL (ref 0.1–1.0)
Monocytes Relative: 7 %
Neutro Abs: 5.4 10*3/uL (ref 1.7–7.7)
Neutrophils Relative %: 60 %
Platelets: 229 10*3/uL (ref 150–400)
RBC: 5.52 MIL/uL (ref 4.22–5.81)
RDW: 12.1 % (ref 11.5–15.5)
WBC: 9.1 10*3/uL (ref 4.0–10.5)
nRBC: 0 % (ref 0.0–0.2)

## 2021-07-01 MED ORDER — OXYCODONE HCL 5 MG PO TABS: 5.0000 mg | ORAL_TABLET | ORAL | 0 refills | Status: DC | PRN

## 2021-07-01 MED ORDER — PROCHLORPERAZINE MALEATE 10 MG PO TABS: 10.0000 mg | ORAL_TABLET | Freq: Four times a day (QID) | ORAL | 2 refills | Status: DC | PRN

## 2021-07-01 NOTE — Progress Notes (Signed)
Hematology/Oncology Consult note [facility] [phone] [phone]  Patient Care Team: [name], NP as PCP - General (Nurse Practitioner) Telford Nab, RN as Oncology Nurse Navigator   Name of the patient: Martha Soltys  [id number]  1955-04-17    Reason for referral-lung mass and concern for metastatic disease   Referring physician-Jolene Canady, NP  Date of visit: 07/01/21   History of presenting illness- Patient is a 66 year old male long-term smoker for over 45 years.  He had a lung cancer screening protocol on 05/29/2021 which showed a right lower lobe lung mass with concern for bilateral adrenal masses.  This was followed by a PET CT scan on 06/15/2021 which showed a right lower lobe soft tissue mass with an SUV uptake of 16.6.  No hypermetabolic hilar or mediastinal adenopathy.  Bilateral hypermetabolic adrenal masses 6.7 and 5.1 cm respectively.  2.8 cm soft tissue lesion in the body of the pancreas and 1.9 cm soft tissue lesion in the tail of the pancreas.  2.3 cm retroperitoneal soft tissue nodule.  10 cm x 6.4 cm soft tissue mass inferior to the transverse colon in the midline.  4.1 x 2.4 cm right mesenteric soft tissue mass with an SUV of 11.7.  Patient lives with his wife and is independent of his ADLs.  Reports that his appetite has been fair and he has lost about 10 pounds in the last 2 months.  He does report right chest wall pain as well as abdominal pain that is not well controlled with Tylenol.  Also reports frequent nausea that is not adequately controlled by Zofran.  Reports dizziness especially when he stands up and switches positions and moves his head.  Family history significant for colon cancer in his maternal first cousin.  No other cancers in the family.  ECOG PS- 1  Pain scale- 4   Review of systems- Review of Systems  Constitutional:  Positive for malaise/fatigue and weight loss. Negative for chills and fever.   HENT:  Negative for congestion, ear discharge and nosebleeds.   Eyes:  Negative for blurred vision.  Respiratory:  Negative for cough, hemoptysis, sputum production, shortness of breath and wheezing.   Cardiovascular:  Negative for chest pain, palpitations, orthopnea and claudication.  Gastrointestinal:  Positive for abdominal pain. Negative for blood in stool, constipation, diarrhea, heartburn, melena, nausea and vomiting.  Genitourinary:  Negative for dysuria, flank pain, frequency, hematuria and urgency.  Musculoskeletal:  Negative for back pain, joint pain and myalgias.  Skin:  Negative for rash.  Neurological:  Negative for dizziness, tingling, focal weakness, seizures, weakness and headaches.  Endo/Heme/Allergies:  Does not bruise/bleed easily.  Psychiatric/Behavioral:  Negative for depression and suicidal ideas. The patient does not have insomnia.     Allergies  Allergen Reactions   Bee Venom     Patient Active Problem List   Diagnosis Date Noted   Mass of lower lobe of right lung 06/24/2021   Ear pressure, bilateral 06/24/2021   Weight loss, abnormal 06/24/2021   Cardiovascular risk factor 05/12/2021   Bee sting allergy 05/12/2021   Obesity 07/20/2019   Nicotine dependence, cigarettes, uncomplicated 11/94/1740   Family history of colon cancer 07/20/2019   COPD (chronic obstructive pulmonary disease) ([place]) 07/20/2019   Preventative health care 07/20/2019     History reviewed. No pertinent past medical history.   Past Surgical History:  Procedure Laterality Date   CATARACT EXTRACTION Right 03/10/2021    Social History   Socioeconomic History  Marital status: Married    Spouse name: Not on file   Number of children: Not on file   Years of education: Not on file   Highest education level: Not on file  Occupational History   Not on file  Tobacco Use   Smoking status: Every Day    Packs/day: 2.00    Years: 51.00    Total pack years: 102.00    Types:  Cigarettes   Smokeless tobacco: Never  Vaping Use   Vaping Use: Never used  Substance and Sexual Activity   Alcohol use: Not Currently   Drug use: Never   Sexual activity: Yes  Other Topics Concern   Not on file  Social History Narrative   Not on file   Social Determinants of Health   Financial Resource Strain: Low Risk  (07/20/2019)   Overall Financial Resource Strain (CARDIA)    Difficulty of Paying Living Expenses: Not hard at all  Food Insecurity: No Food Insecurity (07/20/2019)   Hunger Vital Sign    Worried About Running Out of Food in the Last Year: Never true    Ran Out of Food in the Last Year: Never true  Transportation Needs: No Transportation Needs (07/20/2019)   PRAPARE - Hydrologist (Medical): No    Lack of Transportation (Non-Medical): No  Physical Activity: Inactive (07/20/2019)   Exercise Vital Sign    Days of Exercise per Week: 0 days    Minutes of Exercise per Session: 0 min  Stress: Stress Concern Present (07/20/2019)   [place]    Feeling of Stress : To some extent  Social Connections: Moderately Integrated (07/20/2019)   Social Connection and Isolation Panel [NHANES]    Frequency of Communication with Friends and Family: Three times a week    Frequency of Social Gatherings with Friends and Family: Three times a week    Attends Religious Services: 1 to 4 times per year    Active Member of Clubs or Organizations: No    Attends Archivist Meetings: Never    Marital Status: Married  Human resources officer Violence: Not on file     Family History  Problem Relation Age of Onset   Diabetes Mother    Hypertension Mother    Diabetes Father    Hypertension Father    Diabetes Sister    Cancer - Colon Cousin      Current Outpatient Medications:    albuterol (VENTOLIN HFA) 108 (90 Base) MCG/ACT inhaler, Inhale 2 puffs into the lungs every 6 (six) hours as needed  for wheezing or shortness of breath., Disp: 8 g, Rfl: 1   Glycopyrrolate-Formoterol (BEVESPI AEROSPHERE) 9-4.8 MCG/ACT AERO, Inhale 2 puffs into the lungs daily., Disp: 10.7 g, Rfl: 12   loratadine (CLARITIN) 10 MG tablet, Take 1 tablet (10 mg total) by mouth daily., Disp: 30 tablet, Rfl: 11   ondansetron (ZOFRAN) 4 MG tablet, Take 1 tablet (4 mg total) by mouth every 8 (eight) hours as needed for nausea or vomiting., Disp: 60 tablet, Rfl: 3   prochlorperazine (COMPAZINE) 10 MG tablet, Take 1 tablet (10 mg total) by mouth every 6 (six) hours as needed for nausea or vomiting., Disp: 60 tablet, Rfl: 2   rosuvastatin (CRESTOR) 10 MG tablet, Take 1 tablet (10 mg total) by mouth daily., Disp: 90 tablet, Rfl: 4   EPINEPHrine 0.3 mg/0.3 mL IJ SOAJ injection, Inject 0.3 mg into the muscle as needed for  anaphylaxis. (Patient not taking: Reported on 07/01/2021), Disp: 1 each, Rfl: 4   oxyCODONE (OXY IR/ROXICODONE) 5 MG immediate release tablet, Take 1 tablet (5 mg total) by mouth every 4 (four) hours as needed for severe pain., Disp: 60 tablet, Rfl: 0   Physical exam:  Vitals:   07/01/21 1110 07/01/21 1112  BP:  128/72  Pulse:  79  Resp: 18 18  Temp:  98.1 F (36.7 C)  TempSrc:  Tympanic  SpO2:  95%  Weight: 215 lb (97.5 kg)   Height:  5\' 8"  (1.727 m)   Physical Exam Constitutional:      Comments: Face appears mildly plethoric  Cardiovascular:     Rate and Rhythm: Normal rate and regular rhythm.     Heart sounds: Normal heart sounds.  Pulmonary:     Effort: Pulmonary effort is normal.     Breath sounds: Normal breath sounds.  Abdominal:     General: Bowel sounds are normal.     Palpations: Abdomen is soft.     Comments: There is some fullness in the epigastrium but no palpable mass  Skin:    General: Skin is warm and dry.  Neurological:     Mental Status: He is alert and oriented to person, place, and time.           Latest Ref Rng & Units 07/01/2021   11:57 AM  CMP  Glucose 70 -  99 mg/dL 108   BUN 8 - 23 mg/dL 10   Creatinine 0.61 - 1.24 mg/dL 1.01   Sodium 135 - 145 mmol/L 135   Potassium 3.5 - 5.1 mmol/L 3.8   Chloride 98 - 111 mmol/L 100   CO2 22 - 32 mmol/L 26   Calcium 8.9 - 10.3 mg/dL 9.0   Total Protein 6.5 - 8.1 g/dL 7.5   Total Bilirubin 0.3 - 1.2 mg/dL 1.0   Alkaline Phos 38 - 126 U/L 50   AST 15 - 41 U/L 32   ALT 0 - 44 U/L 17       Latest Ref Rng & Units 07/01/2021   11:57 AM  CBC  WBC 4.0 - 10.5 K/uL 9.1   Hemoglobin 13.0 - 17.0 g/dL 17.5   Hematocrit 39.0 - 52.0 % 50.1   Platelets 150 - 400 K/uL 229     No images are attached to the encounter.  ECHOCARDIOGRAM COMPLETE  Result Date: 06/17/2021    ECHOCARDIOGRAM REPORT   Patient Name:   Vuk YUSSUF SAWYERS Date of Exam: 06/17/2021 Medical Rec #:  [id number]    Height:       68.0 in Accession #:    [id number]   Weight:       220.6 lb Date of Birth:  1955-05-04    BSA:          2.131 m Patient Age:    64 years     BP:           Not listed in chart/Not listed in                                            chart mmHg Patient Gender: M            HR:           Not listed in chart bpm. Exam Location:  [facility] Procedure: 2D Echo, Cardiac Doppler and  Color Doppler Indications:     R07.89 chest discomfort                  SBE I33.9  History:         Patient has no prior history of Echocardiogram examinations.                  Risk Factors:Current Smoker. No past medical history on file.  Sonographer:     Sherrie Sport Referring Phys:  [id number] [name] Diagnosing Phys: Kate Sable MD  Sonographer Comments: Suboptimal apical window. IMPRESSIONS  1. Left ventricular ejection fraction, by estimation, is 55 to 60%. The left ventricle has normal function. The left ventricle has no regional wall motion abnormalities. Left ventricular diastolic parameters are consistent with Grade I diastolic dysfunction (impaired relaxation).  2. Right ventricular systolic function is normal. The right ventricular size is normal.  3. The  mitral valve is normal in structure. No evidence of mitral valve regurgitation.  4. The aortic valve is tricuspid. Aortic valve regurgitation is not visualized. FINDINGS  Left Ventricle: Left ventricular ejection fraction, by estimation, is 55 to 60%. The left ventricle has normal function. The left ventricle has no regional wall motion abnormalities. The left ventricular internal cavity size was normal in size. There is  no left ventricular hypertrophy. Left ventricular diastolic parameters are consistent with Grade I diastolic dysfunction (impaired relaxation). Right Ventricle: The right ventricular size is normal. No increase in right ventricular wall thickness. Right ventricular systolic function is normal. Left Atrium: Left atrial size was normal in size. Right Atrium: Right atrial size was normal in size. Pericardium: There is no evidence of pericardial effusion. Mitral Valve: The mitral valve is normal in structure. No evidence of mitral valve regurgitation. MV peak gradient, 2.4 mmHg. The mean mitral valve gradient is 1.0 mmHg. Tricuspid Valve: The tricuspid valve is normal in structure. Tricuspid valve regurgitation is not demonstrated. Aortic Valve: The aortic valve is tricuspid. Aortic valve regurgitation is not visualized. Aortic valve mean gradient measures 2.0 mmHg. Aortic valve peak gradient measures 3.1 mmHg. Aortic valve area, by VTI measures 3.30 cm. Pulmonic Valve: The pulmonic valve was normal in structure. Pulmonic valve regurgitation is not visualized. Aorta: The aortic root and ascending aorta are structurally normal, with no evidence of dilitation. Venous: The inferior vena cava was not well visualized. IAS/Shunts: No atrial level shunt detected by color flow Doppler.  LEFT VENTRICLE PLAX 2D LVIDd:         4.60 cm   Diastology LVIDs:         2.90 cm   LV e' medial:    6.31 cm/s LV PW:         1.30 cm   LV E/e' medial:  10.6 LV IVS:        0.80 cm   LV e' lateral:   7.07 cm/s LVOT diam:      2.00 cm   LV E/e' lateral: 9.4 LV SV:         53 LV SV Index:   25 LVOT Area:     3.14 cm  RIGHT VENTRICLE RV Basal diam:  3.60 cm RV S prime:     14.50 cm/s TAPSE (M-mode): 2.3 cm LEFT ATRIUM             Index        RIGHT ATRIUM           Index LA diam:  2.90 cm 1.36 cm/m   RA Area:     15.70 cm LA Vol (A2C):   57.6 ml 27.03 ml/m  RA Volume:   38.20 ml  17.93 ml/m LA Vol (A4C):   42.7 ml 20.04 ml/m LA Biplane Vol: 51.4 ml 24.12 ml/m  AORTIC VALVE                    PULMONIC VALVE AV Area (Vmax):    2.93 cm     PV Vmax:        0.65 m/s AV Area (Vmean):   2.88 cm     PV Vmean:       45.700 cm/s AV Area (VTI):     3.30 cm     PV VTI:         0.129 m AV Vmax:           87.50 cm/s   PV Peak grad:   1.7 mmHg AV Vmean:          59.600 cm/s  PV Mean grad:   1.0 mmHg AV VTI:            0.160 m      RVOT Peak grad: 2 mmHg AV Peak Grad:      3.1 mmHg AV Mean Grad:      2.0 mmHg LVOT Vmax:         81.60 cm/s LVOT Vmean:        54.700 cm/s LVOT VTI:          0.168 m LVOT/AV VTI ratio: 1.05  AORTA Ao Root diam: 3.03 cm MITRAL VALVE               TRICUSPID VALVE MV Area (PHT): 3.87 cm    TR Peak grad:   13.1 mmHg MV Area VTI:   2.45 cm    TR Vmax:        181.00 cm/s MV Peak grad:  2.4 mmHg MV Mean grad:  1.0 mmHg    SHUNTS MV Vmax:       0.78 m/s    Systemic VTI:  0.17 m MV Vmean:      46.3 cm/s   Systemic Diam: 2.00 cm MV Decel Time: 196 msec    Pulmonic VTI:  0.105 m MV E velocity: 66.80 cm/s MV A velocity: 94.30 cm/s MV E/A ratio:  0.71 Kate Sable MD Electronically signed by Kate Sable MD Signature Date/Time: 06/17/2021/5:15:12 PM    Final    NM PET Image Initial (PI) Skull Base To Thigh  Result Date: 06/16/2021 CLINICAL DATA:  Initial treatment strategy for right lower lobe pulmonary mass. EXAM: NUCLEAR MEDICINE PET SKULL BASE TO THIGH TECHNIQUE: 12.4 mCi F-18 FDG was injected intravenously. Full-ring PET imaging was performed from the skull base to thigh after the radiotracer. CT data was  obtained and used for attenuation correction and anatomic localization. Fasting blood glucose: 94 mg/dl COMPARISON:  Lung cancer screening chest CT 05/29/2021 FINDINGS: Mediastinal blood pool activity: SUV max 1.7 Liver activity: SUV max NA NECK: No hypermetabolic lymph nodes in the neck. Incidental CT findings: none CHEST: Soft tissue mass identified previously at the takeoff of the right lower lobe bronchus is again noted. This lesion is markedly hypermetabolic with SUV max = 51.8. No hypermetabolic metastatic lymphadenopathy in the mediastinum or left hilum no other hypermetabolic pulmonary nodule or mass. Incidental CT findings: Coronary artery calcification is evident. Mild atherosclerotic calcification is noted in the wall of the thoracic aorta.  ABDOMEN/PELVIS: 6.7 cm right adrenal mass identified on previous study shows marked peripheral hypermetabolism corresponding to areas of peripheral soft tissue nodularity surrounding low-density cystic/necrotic center. SUV max = 11.4. 5.1 cm left adrenal mass identified on the previous exam also shows peripheral nodular hypermetabolism with SUV max = 12.6. 2.8 cm soft tissue lesion in the body of pancreas (axial image 150/series 2) is hypermetabolic with SUV max = 7.1. 1.9 cm soft tissue lesion in the tail of pancreas (016/0) is hypermetabolic with SUV max = 5.4. 2.3 cm retroperitoneal soft tissue nodule posterior to the right renal vein on image 109/3 is hypermetabolic with SUV max = 5.5. 10.1 x 6.4 cm soft tissue mass just inferior to the transverse colon in the midline omentum/anterior mesentery is hypermetabolic with SUV max = 23.5. 4.1 x 2.4 cm right mesenteric soft tissue mass on 191/2 demonstrates SUV max = 11.7. No focal hypermetabolic lesion identified in the liver or spleen. Incidental CT findings: Small calcified gallstones evident. No biliary dilatation. Left colonic diverticulosis without diverticulitis. Trace free fluid noted in the pelvis. SKELETON: No  focal hypermetabolic activity to suggest skeletal metastasis. Incidental CT findings: No worrisome lytic or sclerotic osseous abnormality. IMPRESSION: 1. Parahilar right lung mass of concern on recent lung cancer screening CT is markedly hypermetabolic consistent with malignancy. This could be a primary bronchogenic neoplasm or metastatic involvement. 2. Hypermetabolic nodules are identified in the body and tail of pancreas with bilateral large necrotic hypermetabolic adrenal metastases. 3. Dominant bulky hypermetabolic soft tissue mass in the midline omentum/anterior mesentery. This does appear to be in contact with the inferior wall the transverse colon although the lesions largely external to the wall the colon. No definite incorporation of small-bowel loops into this lesion although assessment is limited by nondiagnostic CT imaging used for attenuation correction and lack of oral intravenous contrast material. 4. Additional smaller right mesenteric hypermetabolic mass and a right retroperitoneal hypermetabolic soft tissue nodule noted. 5. No obvious primary site of disease on the current exam. Midline omental nodule should be amenable to tissue sampling. 6. Cholelithiasis 7. Left colonic diverticulosis without diverticulitis Electronically Signed   By: Misty Stanley M.D.   On: 06/16/2021 13:43    Assessment and plan- Patient is a 66 y.o. male referred with findings concerning for right lower lobe lung mass as well as widespread metastatic disease  I have reviewed PET CT scan images independently and has shown these images to patient and his wife.  Patient has a central right lower lobe lung mass along with soft tissue lesions in the pancreas, bilateral adrenal nodules as well as soft tissue masses in the omentum concerning for widespread malignancy.  Given the primary lung mass and history of smoking certainly lung cancer is up in the differential.  I will be checking a CBC with differential CMP CEA and CA  19-9 today.  I will plan to get a CT-guided omental mass biopsy.  I will also get an MRI brain with and without contrast to rule out metastatic disease given his symptoms of dizziness.  Neoplasm related pain I will start him on oxycodone 5 mg every 6 hours as needed  Nausea possibly secondary to underlying malignancy and significant omental involvement.  Zofran as needed was not helping and I will add Compazine 10 mg every 6 hours as needed as well as Zyprexa 10 mg nightly.  I will tentatively see the patient back in 1 week to 10 days timeAfter results of biopsy are back.  Explained to  the patient and his wife that unfortunately this looks like stage IV malignancy and treatment will likely be palliative and not curative unless this turns out to be a lymphoma   Thank you for this kind referral and the opportunity to participate in the care of this patient   Visit Diagnosis 1. Mass of lower lobe of right lung   2. Omental mass     Dr. Randa Evens, MD, MPH [facility] [phone] 07/01/2021

## 2021-07-01 NOTE — Progress Notes (Signed)
Met with patient and his wife during initial consult with Dr. Janese Banks. All questions answered during visit. Introduced to navigator services. Reviewed upcoming appts and informed that he will be called with his biopsy appt once scheduled. Contact info given and instructed to call with any questions or needs. Pt and his wife verbalized understanding.

## 2021-07-01 NOTE — Progress Notes (Signed)
Patient concerns: Trouble sleeping, no appetite (purchased ensure), nausea (has zofran, helps some), SOB, back and for diarrhea and constipation, stomach/chest stinging, dizziness and balance problems, wife concerned about gallstones on imaging report.

## 2021-07-02 ENCOUNTER — Other Ambulatory Visit: Payer: Self-pay | Admitting: Internal Medicine

## 2021-07-02 ENCOUNTER — Institutional Professional Consult (permissible substitution): Payer: Medicare HMO | Admitting: Pulmonary Disease

## 2021-07-02 ENCOUNTER — Ambulatory Visit
Admission: RE | Admit: 2021-07-02 | Discharge: 2021-07-02 | Disposition: A | Discharge status: Home / Self Care | Payer: Medicare HMO | Source: Ambulatory Visit | Attending: Oncology | Admitting: Oncology

## 2021-07-02 ENCOUNTER — Other Ambulatory Visit: Payer: Self-pay | Admitting: Oncology

## 2021-07-02 ENCOUNTER — Telehealth: Payer: Self-pay

## 2021-07-02 DIAGNOSIS — R918 Other nonspecific abnormal finding of lung field: Secondary | ICD-10-CM | POA: Insufficient documentation

## 2021-07-02 DIAGNOSIS — K668 Other specified disorders of peritoneum: Secondary | ICD-10-CM

## 2021-07-02 DIAGNOSIS — C3490 Malignant neoplasm of unspecified part of unspecified bronchus or lung: Secondary | ICD-10-CM

## 2021-07-02 DIAGNOSIS — G9389 Other specified disorders of brain: Secondary | ICD-10-CM | POA: Diagnosis not present

## 2021-07-02 LAB — CEA: CEA: 13.9 ng/mL — ABNORMAL HIGH (ref 0.0–4.7)

## 2021-07-02 LAB — CANCER ANTIGEN 19-9: CA 19-9: 20 U/mL (ref 0–35)

## 2021-07-02 MED ORDER — DEXAMETHASONE 4 MG PO TABS: 4.0000 mg | ORAL_TABLET | Freq: Three times a day (TID) | ORAL | 0 refills | Status: DC

## 2021-07-02 MED ORDER — GADOBUTROL 1 MMOL/ML IV SOLN
9.0000 mL | Freq: Once | INTRAVENOUS | Status: AC | PRN
  Administered 2021-07-02: 9 mL via INTRAVENOUS

## 2021-07-02 MED ORDER — PANTOPRAZOLE SODIUM 20 MG PO TBEC: 20.0000 mg | DELAYED_RELEASE_TABLET | Freq: Every day | ORAL | 0 refills | Status: DC

## 2021-07-02 NOTE — Chronic Care Management (AMB) (Signed)
  Chronic Care Management Note  07/02/2021 Name: Maurice Simpson MRN: [id number] DOB: October 25, 1955  Maurice Simpson is a 66 y.o. year old male who is a primary care patient of [name], NP and is actively engaged with the care management team. I reached out to [name] by phone today to assist with re-scheduling an initial visit with the Pharmacist  Follow up plan: Patient declines further follow up and engagement by the care management team. Appropriate care team members and provider have been notified via electronic communication.   [name], [place], [place], [place] [address] Direct Dial: [phone] [name]

## 2021-07-02 NOTE — Addendum Note (Signed)
Addended by: Telford Nab on: 07/02/2021 03:36 PM   Modules accepted: Orders

## 2021-07-03 ENCOUNTER — Telehealth: Payer: Self-pay | Admitting: *Deleted

## 2021-07-06 ENCOUNTER — Ambulatory Visit
Admission: RE | Admit: 2021-07-06 | Discharge: 2021-07-06 | Disposition: A | Discharge status: Home / Self Care | Payer: Medicare HMO | Source: Ambulatory Visit | Attending: Radiation Oncology | Admitting: Radiation Oncology

## 2021-07-06 ENCOUNTER — Encounter: Payer: Self-pay | Admitting: *Deleted

## 2021-07-06 VITALS — BP 135/76 | HR 71 | Resp 18 | Ht 69.0 in | Wt 217.9 lb

## 2021-07-06 DIAGNOSIS — F17210 Nicotine dependence, cigarettes, uncomplicated: Secondary | ICD-10-CM | POA: Diagnosis not present

## 2021-07-06 DIAGNOSIS — Z79899 Other long term (current) drug therapy: Secondary | ICD-10-CM | POA: Insufficient documentation

## 2021-07-06 DIAGNOSIS — Z510 Encounter for antineoplastic radiation therapy: Secondary | ICD-10-CM | POA: Insufficient documentation

## 2021-07-06 DIAGNOSIS — R110 Nausea: Secondary | ICD-10-CM | POA: Diagnosis not present

## 2021-07-06 DIAGNOSIS — C7931 Secondary malignant neoplasm of brain: Secondary | ICD-10-CM | POA: Insufficient documentation

## 2021-07-06 DIAGNOSIS — Z7952 Long term (current) use of systemic steroids: Secondary | ICD-10-CM | POA: Insufficient documentation

## 2021-07-06 DIAGNOSIS — Z800 Family history of malignant neoplasm of digestive organs: Secondary | ICD-10-CM | POA: Insufficient documentation

## 2021-07-06 DIAGNOSIS — C3431 Malignant neoplasm of lower lobe, right bronchus or lung: Secondary | ICD-10-CM | POA: Diagnosis not present

## 2021-07-06 DIAGNOSIS — R918 Other nonspecific abnormal finding of lung field: Secondary | ICD-10-CM

## 2021-07-06 DIAGNOSIS — C801 Malignant (primary) neoplasm, unspecified: Secondary | ICD-10-CM | POA: Diagnosis not present

## 2021-07-06 DIAGNOSIS — R634 Abnormal weight loss: Secondary | ICD-10-CM

## 2021-07-07 ENCOUNTER — Ambulatory Visit: Payer: Medicare HMO | Admitting: Gastroenterology

## 2021-07-07 ENCOUNTER — Telehealth: Payer: Self-pay | Admitting: *Deleted

## 2021-07-07 DIAGNOSIS — C7931 Secondary malignant neoplasm of brain: Secondary | ICD-10-CM | POA: Diagnosis not present

## 2021-07-07 DIAGNOSIS — C3431 Malignant neoplasm of lower lobe, right bronchus or lung: Secondary | ICD-10-CM | POA: Diagnosis not present

## 2021-07-07 DIAGNOSIS — Z510 Encounter for antineoplastic radiation therapy: Secondary | ICD-10-CM | POA: Diagnosis not present

## 2021-07-07 DIAGNOSIS — C801 Malignant (primary) neoplasm, unspecified: Secondary | ICD-10-CM | POA: Diagnosis not present

## 2021-07-08 ENCOUNTER — Ambulatory Visit
Admission: RE | Admit: 2021-07-08 | Discharge: 2021-07-08 | Disposition: A | Discharge status: Home / Self Care | Payer: Medicare HMO | Source: Ambulatory Visit | Attending: Radiation Oncology | Admitting: Radiation Oncology

## 2021-07-08 ENCOUNTER — Other Ambulatory Visit: Payer: Self-pay

## 2021-07-08 DIAGNOSIS — C7931 Secondary malignant neoplasm of brain: Secondary | ICD-10-CM | POA: Diagnosis not present

## 2021-07-08 DIAGNOSIS — C3431 Malignant neoplasm of lower lobe, right bronchus or lung: Secondary | ICD-10-CM | POA: Diagnosis not present

## 2021-07-08 DIAGNOSIS — C801 Malignant (primary) neoplasm, unspecified: Secondary | ICD-10-CM | POA: Diagnosis not present

## 2021-07-08 DIAGNOSIS — Z510 Encounter for antineoplastic radiation therapy: Secondary | ICD-10-CM | POA: Diagnosis not present

## 2021-07-08 LAB — RAD ONC ARIA SESSION SUMMARY
Course Elapsed Days: 0
Plan Fractions Treated to Date: 1
Plan Prescribed Dose Per Fraction: 3 Gy
Plan Total Fractions Prescribed: 10
Plan Total Prescribed Dose: 30 Gy
Reference Point Dosage Given to Date: 3 Gy
Reference Point Session Dosage Given: 3 Gy
Session Number: 1

## 2021-07-09 ENCOUNTER — Other Ambulatory Visit: Payer: Self-pay

## 2021-07-09 ENCOUNTER — Ambulatory Visit: Payer: Medicare HMO

## 2021-07-09 ENCOUNTER — Other Ambulatory Visit (HOSPITAL_COMMUNITY): Payer: Self-pay | Admitting: Physician Assistant

## 2021-07-09 ENCOUNTER — Ambulatory Visit
Admission: RE | Admit: 2021-07-09 | Discharge: 2021-07-09 | Disposition: A | Discharge status: Home / Self Care | Payer: Medicare HMO | Source: Ambulatory Visit | Attending: Radiation Oncology | Admitting: Radiation Oncology

## 2021-07-09 DIAGNOSIS — C801 Malignant (primary) neoplasm, unspecified: Secondary | ICD-10-CM | POA: Diagnosis not present

## 2021-07-09 DIAGNOSIS — K668 Other specified disorders of peritoneum: Secondary | ICD-10-CM

## 2021-07-09 DIAGNOSIS — Z510 Encounter for antineoplastic radiation therapy: Secondary | ICD-10-CM | POA: Diagnosis not present

## 2021-07-09 DIAGNOSIS — C7931 Secondary malignant neoplasm of brain: Secondary | ICD-10-CM | POA: Diagnosis not present

## 2021-07-09 LAB — RAD ONC ARIA SESSION SUMMARY
Course Elapsed Days: 1
Plan Fractions Treated to Date: 2
Plan Prescribed Dose Per Fraction: 3 Gy
Plan Total Fractions Prescribed: 10
Plan Total Prescribed Dose: 30 Gy
Reference Point Dosage Given to Date: 6 Gy
Reference Point Session Dosage Given: 3 Gy
Session Number: 2

## 2021-07-09 NOTE — H&P (Signed)
Chief Complaint: Patient was seen in consultation today for omental mass  at the request of Clarendon C  Referring Physician(s): Rao,Archana C  Supervising Physician: Juliet Rude  Patient Status: [facility] - [facility]  History of Present Illness: Maurice Simpson is a 66 y.o. male with hx of tobacco abuse and lung cancer. Pt had routine lung screening 05/29/21 that showed right lower lobe lung mass with concern for bilateral adrenal masses. PET scan 06/15/21 showed RLL soft tissue mass, bilateral hypermetabolic adrenal masses, pancreatic body and tail lesion, retroperitoneal nodule, soft tissue mass inferior to the transverse colon in midline and right mesenteric soft tissue mass. Pt was referred to IR for omental mass biopsy.    No past medical history on file.  Past Surgical History:  Procedure Laterality Date   CATARACT EXTRACTION Right 03/10/2021    Allergies: Bee venom  Medications: Prior to Admission medications   Medication Sig Start Date End Date Taking? Authorizing Provider  albuterol (VENTOLIN HFA) 108 (90 Base) MCG/ACT inhaler Inhale 2 puffs into the lungs every 6 (six) hours as needed for wheezing or shortness of breath. 04/30/21   Johnson, Megan P, DO  dexamethasone (DECADRON) 4 MG tablet Take 1 tablet (4 mg total) by mouth 3 (three) times daily. 07/02/21   Sindy Guadeloupe, MD  EPINEPHrine 0.3 mg/0.3 mL IJ SOAJ injection Inject 0.3 mg into the muscle as needed for anaphylaxis. Patient not taking: Reported on 07/01/2021 05/12/21   [name], NP  Glycopyrrolate-Formoterol (BEVESPI AEROSPHERE) 9-4.8 MCG/ACT AERO Inhale 2 puffs into the lungs daily. 05/12/21   [name], [name], NP  loratadine (CLARITIN) 10 MG tablet Take 1 tablet (10 mg total) by mouth daily. 06/24/21   [name], [name], NP  ondansetron (ZOFRAN) 4 MG tablet Take 1 tablet (4 mg total) by mouth every 8 (eight) hours as needed for nausea or vomiting. 06/24/21   [name], [name], NP  oxyCODONE (OXY  IR/ROXICODONE) 5 MG immediate release tablet Take 1 tablet (5 mg total) by mouth every 4 (four) hours as needed for severe pain. 07/01/21   Sindy Guadeloupe, MD  pantoprazole (PROTONIX) 20 MG tablet Take 1 tablet (20 mg total) by mouth daily. 07/02/21   Sindy Guadeloupe, MD  prochlorperazine (COMPAZINE) 10 MG tablet Take 1 tablet (10 mg total) by mouth every 6 (six) hours as needed for nausea or vomiting. 07/01/21   Sindy Guadeloupe, MD  rosuvastatin (CRESTOR) 10 MG tablet Take 1 tablet (10 mg total) by mouth daily. 05/12/21   [name], NP     Family History  Problem Relation Age of Onset   Diabetes Mother    Hypertension Mother    Diabetes Father    Hypertension Father    Diabetes Sister    Cancer - Colon Cousin     Social History   Socioeconomic History   Marital status: Married    Spouse name: Not on file   Number of children: Not on file   Years of education: Not on file   Highest education level: Not on file  Occupational History   Not on file  Tobacco Use   Smoking status: Every Day    Packs/day: 2.00    Years: 51.00    Total pack years: 102.00    Types: Cigarettes   Smokeless tobacco: Never  Vaping Use   Vaping Use: Never used  Substance and Sexual Activity   Alcohol use: Not Currently   Drug use: Never  Sexual activity: Yes  Other Topics Concern   Not on file  Social History Narrative   Not on file   Social Determinants of Health   Financial Resource Strain: Low Risk  (07/20/2019)   Overall Financial Resource Strain (CARDIA)    Difficulty of Paying Living Expenses: Not hard at all  Food Insecurity: No Food Insecurity (07/20/2019)   Hunger Vital Sign    Worried About Running Out of Food in the Last Year: Never true    Ran Out of Food in the Last Year: Never true  Transportation Needs: No Transportation Needs (07/20/2019)   PRAPARE - Hydrologist (Medical): No    Lack of Transportation (Non-Medical): No  Physical Activity: Inactive  (07/20/2019)   Exercise Vital Sign    Days of Exercise per Week: 0 days    Minutes of Exercise per Session: 0 min  Stress: Stress Concern Present (07/20/2019)   [place]    Feeling of Stress : To some extent  Social Connections: Moderately Integrated (07/20/2019)   Social Connection and Isolation Panel [NHANES]    Frequency of Communication with Friends and Family: Three times a week    Frequency of Social Gatherings with Friends and Family: Three times a week    Attends Religious Services: 1 to 4 times per year    Active Member of Clubs or Organizations: No    Attends Archivist Meetings: Never    Marital Status: Married    Review of Systems: A 12 point ROS discussed and pertinent positives are indicated in the HPI above.  All other systems are negative.  Review of Systems  Constitutional:  Positive for fatigue. Negative for chills and fever.  Respiratory:  Positive for cough and shortness of breath.   Cardiovascular:  Positive for chest pain.  Gastrointestinal:  Positive for abdominal pain. Negative for nausea and vomiting.  Neurological:  Positive for weakness. Negative for dizziness.    Vital Signs: BP (!) 145/76   Pulse 62   Temp 97.9 F (36.6 C) (Oral)   Resp 20   Ht 5\' 8"  (1.727 m)   Wt 217 lb (98.4 kg)   SpO2 92%   BMI 32.99 kg/m      Physical Exam Vitals reviewed.  Constitutional:      General: He is not in acute distress.    Appearance: Normal appearance. He is not ill-appearing.  HENT:     Mouth/Throat:     Mouth: Mucous membranes are dry.     Pharynx: Oropharynx is clear.  Eyes:     Extraocular Movements: Extraocular movements intact.     Pupils: Pupils are equal, round, and reactive to light.  Cardiovascular:     Rate and Rhythm: Normal rate and regular rhythm.  Pulmonary:     Effort: Pulmonary effort is normal. No respiratory distress.     Breath sounds: Rhonchi present.   Abdominal:     General: Bowel sounds are normal. There is no distension.     Tenderness: There is no abdominal tenderness. There is no guarding.  Musculoskeletal:     Right lower leg: No edema.     Left lower leg: No edema.  Skin:    General: Skin is warm and dry.  Neurological:     Mental Status: He is alert and oriented to person, place, and time.  Psychiatric:        Mood and Affect: Mood normal.  Behavior: Behavior normal.        Thought Content: Thought content normal.        Judgment: Judgment normal.     Imaging: MR Brain W Wo Contrast  Result Date: 07/02/2021 CLINICAL DATA:  Lung mass.  Metastatic disease evaluation EXAM: MRI HEAD WITHOUT AND WITH CONTRAST TECHNIQUE: Multiplanar, multiecho pulse sequences of the brain and surrounding structures were obtained without and with intravenous contrast. CONTRAST:  50mL GADAVIST GADOBUTROL 1 MMOL/ML IV SOLN COMPARISON:  None Available. FINDINGS: Brain: Numerous enhancing lesions are present in the brain compatible with widespread metastatic disease. Largest lesion in the left frontal lobe shows mixed cystic and solid components and extends to the dural surface. This lesion measures 3.6 x 4.3 cm with a moderate foraminal adjacent white matter edema. This lesion also shows internal hemorrhage. 6 mm midline shift to the right due to edema. 2 mm enhancing lesion right cerebellar tonsil axial image 29 5 mm enhancing lesion right inferior cerebellum axial image 33 3 mm enhancing lesion right superior cerebellum axial image 51 5 mm lesion cerebellar vermis axial image 49 3 mm lesion left inferolateral cerebellum axial image 34 6 mm enhancing lesion left cerebellum axial image 42 10 mm ring-enhancing lesion anterior pons to the right of midline axial image 54 3 mm lesion right medial temporal lobe axial image 52 4 mm lesion right medial temporal lobe axial image 54 5 mm lesion right medial temporal lobe axial image 61 5 mm lesion right mid  temporal lobe axial image 65 4 mm lesion right frontal lobe axial image 80 3 mm lesion right medial frontal lobe axial image 80 5 mm lesion right superior posterior temporal lobe axial image 86 6 mm lesion right parietal lobe axial image 93 24 x 15 mm necrotic rim enhancing lesion right occipital lobe axial image 93 7 mm lesion right parietal lobe axial image 101 5 mm lesion right parietal lobe axial image 105 4 mm lesion right frontal lobe axial image 109 3 mm lesion right frontal lobe axial image 120 4 mm lesion right medial frontal parietal lobe axial image 121 3 mm lesion right frontal lobe axial image 134 3 mm lesion right anterior frontal lobe axial image 133 3 mm lesion right frontal convexity axial image 144 5 mm lesion left temporal lobe axial image 51 6 mm lesion left lateral temporal lobe axial image 62 4 mm lesion left lateral basal ganglia axial image 74 6 mm lesion left medial basal ganglia axial image 78 3 mm lesion left internal capsule axial image 85 3 mm lesion left posterior basal ganglia axial image 83 6 mm lesion left medial basal ganglia axial image 93 6 mm lesion left lateral temporal lobe axial image 118 3 mm lesion left medial frontal lobe axial image 133 Mild atrophy. Negative for acute infarct. Negative for hydrocephalus Vascular: Normal arterial flow voids. Skull and upper cervical spine: Negative Sinuses/Orbits: Paranasal sinuses clear.  Right cataract extraction Other: None IMPRESSION: Extensive intracranial metastatic disease. Largest lesion left frontal lobe has significant edema, internal hemorrhage, and is causing mass-effect with 6 mm midline shift to the right. Approximately 30 for metastatic deposit identified in the brain. Electronically Signed   By: Franchot Gallo M.D.   On: 07/02/2021 13:45   ECHOCARDIOGRAM COMPLETE  Result Date: 06/17/2021    ECHOCARDIOGRAM REPORT   Patient Name:   JONATHYN CAROTHERS Date of Exam: 06/17/2021 Medical Rec #:  [id number]    Height:       68.0  in  Accession #:    [id number]   Weight:       220.6 lb Date of Birth:  April 16, 1955    BSA:          2.131 m Patient Age:    30 years     BP:           Not listed in chart/Not listed in                                            chart mmHg Patient Gender: M            HR:           Not listed in chart bpm. Exam Location:  [facility] Procedure: 2D Echo, Cardiac Doppler and Color Doppler Indications:     R07.89 chest discomfort                  SBE I33.9  History:         Patient has no prior history of Echocardiogram examinations.                  Risk Factors:Current Smoker. No past medical history on file.  Sonographer:     Sherrie Sport Referring Phys:  [id number] [name] Diagnosing Phys: Kate Sable MD  Sonographer Comments: Suboptimal apical window. IMPRESSIONS  1. Left ventricular ejection fraction, by estimation, is 55 to 60%. The left ventricle has normal function. The left ventricle has no regional wall motion abnormalities. Left ventricular diastolic parameters are consistent with Grade I diastolic dysfunction (impaired relaxation).  2. Right ventricular systolic function is normal. The right ventricular size is normal.  3. The mitral valve is normal in structure. No evidence of mitral valve regurgitation.  4. The aortic valve is tricuspid. Aortic valve regurgitation is not visualized. FINDINGS  Left Ventricle: Left ventricular ejection fraction, by estimation, is 55 to 60%. The left ventricle has normal function. The left ventricle has no regional wall motion abnormalities. The left ventricular internal cavity size was normal in size. There is  no left ventricular hypertrophy. Left ventricular diastolic parameters are consistent with Grade I diastolic dysfunction (impaired relaxation). Right Ventricle: The right ventricular size is normal. No increase in right ventricular wall thickness. Right ventricular systolic function is normal. Left Atrium: Left atrial size was normal in size. Right Atrium: Right  atrial size was normal in size. Pericardium: There is no evidence of pericardial effusion. Mitral Valve: The mitral valve is normal in structure. No evidence of mitral valve regurgitation. MV peak gradient, 2.4 mmHg. The mean mitral valve gradient is 1.0 mmHg. Tricuspid Valve: The tricuspid valve is normal in structure. Tricuspid valve regurgitation is not demonstrated. Aortic Valve: The aortic valve is tricuspid. Aortic valve regurgitation is not visualized. Aortic valve mean gradient measures 2.0 mmHg. Aortic valve peak gradient measures 3.1 mmHg. Aortic valve area, by VTI measures 3.30 cm. Pulmonic Valve: The pulmonic valve was normal in structure. Pulmonic valve regurgitation is not visualized. Aorta: The aortic root and ascending aorta are structurally normal, with no evidence of dilitation. Venous: The inferior vena cava was not well visualized. IAS/Shunts: No atrial level shunt detected by color flow Doppler.  LEFT VENTRICLE PLAX 2D LVIDd:         4.60 cm   Diastology LVIDs:         2.90 cm  LV e' medial:    6.31 cm/s LV PW:         1.30 cm   LV E/e' medial:  10.6 LV IVS:        0.80 cm   LV e' lateral:   7.07 cm/s LVOT diam:     2.00 cm   LV E/e' lateral: 9.4 LV SV:         53 LV SV Index:   25 LVOT Area:     3.14 cm  RIGHT VENTRICLE RV Basal diam:  3.60 cm RV S prime:     14.50 cm/s TAPSE (M-mode): 2.3 cm LEFT ATRIUM             Index        RIGHT ATRIUM           Index LA diam:        2.90 cm 1.36 cm/m   RA Area:     15.70 cm LA Vol (A2C):   57.6 ml 27.03 ml/m  RA Volume:   38.20 ml  17.93 ml/m LA Vol (A4C):   42.7 ml 20.04 ml/m LA Biplane Vol: 51.4 ml 24.12 ml/m  AORTIC VALVE                    PULMONIC VALVE AV Area (Vmax):    2.93 cm     PV Vmax:        0.65 m/s AV Area (Vmean):   2.88 cm     PV Vmean:       45.700 cm/s AV Area (VTI):     3.30 cm     PV VTI:         0.129 m AV Vmax:           87.50 cm/s   PV Peak grad:   1.7 mmHg AV Vmean:          59.600 cm/s  PV Mean grad:   1.0 mmHg AV  VTI:            0.160 m      RVOT Peak grad: 2 mmHg AV Peak Grad:      3.1 mmHg AV Mean Grad:      2.0 mmHg LVOT Vmax:         81.60 cm/s LVOT Vmean:        54.700 cm/s LVOT VTI:          0.168 m LVOT/AV VTI ratio: 1.05  AORTA Ao Root diam: 3.03 cm MITRAL VALVE               TRICUSPID VALVE MV Area (PHT): 3.87 cm    TR Peak grad:   13.1 mmHg MV Area VTI:   2.45 cm    TR Vmax:        181.00 cm/s MV Peak grad:  2.4 mmHg MV Mean grad:  1.0 mmHg    SHUNTS MV Vmax:       0.78 m/s    Systemic VTI:  0.17 m MV Vmean:      46.3 cm/s   Systemic Diam: 2.00 cm MV Decel Time: 196 msec    Pulmonic VTI:  0.105 m MV E velocity: 66.80 cm/s MV A velocity: 94.30 cm/s MV E/A ratio:  0.71 Kate Sable MD Electronically signed by Kate Sable MD Signature Date/Time: 06/17/2021/5:15:12 PM    Final    NM PET Image Initial (PI) Skull Base To Thigh  Result Date: 06/16/2021 CLINICAL DATA:  Initial treatment  strategy for right lower lobe pulmonary mass. EXAM: NUCLEAR MEDICINE PET SKULL BASE TO THIGH TECHNIQUE: 12.4 mCi F-18 FDG was injected intravenously. Full-ring PET imaging was performed from the skull base to thigh after the radiotracer. CT data was obtained and used for attenuation correction and anatomic localization. Fasting blood glucose: 94 mg/dl COMPARISON:  Lung cancer screening chest CT 05/29/2021 FINDINGS: Mediastinal blood pool activity: SUV max 1.7 Liver activity: SUV max NA NECK: No hypermetabolic lymph nodes in the neck. Incidental CT findings: none CHEST: Soft tissue mass identified previously at the takeoff of the right lower lobe bronchus is again noted. This lesion is markedly hypermetabolic with SUV max = 11.9. No hypermetabolic metastatic lymphadenopathy in the mediastinum or left hilum no other hypermetabolic pulmonary nodule or mass. Incidental CT findings: Coronary artery calcification is evident. Mild atherosclerotic calcification is noted in the wall of the thoracic aorta. ABDOMEN/PELVIS: 6.7 cm right  adrenal mass identified on previous study shows marked peripheral hypermetabolism corresponding to areas of peripheral soft tissue nodularity surrounding low-density cystic/necrotic center. SUV max = 11.4. 5.1 cm left adrenal mass identified on the previous exam also shows peripheral nodular hypermetabolism with SUV max = 12.6. 2.8 cm soft tissue lesion in the body of pancreas (axial image 150/series 2) is hypermetabolic with SUV max = 7.1. 1.9 cm soft tissue lesion in the tail of pancreas (417/4) is hypermetabolic with SUV max = 5.4. 2.3 cm retroperitoneal soft tissue nodule posterior to the right renal vein on image 081/4 is hypermetabolic with SUV max = 5.5. 10.1 x 6.4 cm soft tissue mass just inferior to the transverse colon in the midline omentum/anterior mesentery is hypermetabolic with SUV max = 48.1. 4.1 x 2.4 cm right mesenteric soft tissue mass on 191/2 demonstrates SUV max = 11.7. No focal hypermetabolic lesion identified in the liver or spleen. Incidental CT findings: Small calcified gallstones evident. No biliary dilatation. Left colonic diverticulosis without diverticulitis. Trace free fluid noted in the pelvis. SKELETON: No focal hypermetabolic activity to suggest skeletal metastasis. Incidental CT findings: No worrisome lytic or sclerotic osseous abnormality. IMPRESSION: 1. Parahilar right lung mass of concern on recent lung cancer screening CT is markedly hypermetabolic consistent with malignancy. This could be a primary bronchogenic neoplasm or metastatic involvement. 2. Hypermetabolic nodules are identified in the body and tail of pancreas with bilateral large necrotic hypermetabolic adrenal metastases. 3. Dominant bulky hypermetabolic soft tissue mass in the midline omentum/anterior mesentery. This does appear to be in contact with the inferior wall the transverse colon although the lesions largely external to the wall the colon. No definite incorporation of small-bowel loops into this lesion  although assessment is limited by nondiagnostic CT imaging used for attenuation correction and lack of oral intravenous contrast material. 4. Additional smaller right mesenteric hypermetabolic mass and a right retroperitoneal hypermetabolic soft tissue nodule noted. 5. No obvious primary site of disease on the current exam. Midline omental nodule should be amenable to tissue sampling. 6. Cholelithiasis 7. Left colonic diverticulosis without diverticulitis Electronically Signed   By: Misty Stanley M.D.   On: 06/16/2021 13:43    Labs:  CBC: Recent Labs    04/30/21 1136 07/01/21 1157  WBC 8.7 9.1  HGB 18.0* 17.5*  HCT 53.3* 50.1  PLT 156 229    COAGS: No results for input(s): "INR", "APTT" in the last 8760 hours.  BMP: Recent Labs    04/30/21 1136 07/01/21 1157  NA 136 135  K 4.2 3.8  CL 101 100  CO2 20  26  GLUCOSE 94 108*  BUN 12 10  CALCIUM 9.4 9.0  CREATININE 0.85 1.01  GFRNONAA  --  >60    LIVER FUNCTION TESTS: Recent Labs    04/30/21 1136 07/01/21 1157  BILITOT 0.8 1.0  AST 29 32  ALT 21 17  ALKPHOS 59 50  PROT 7.2 7.5  ALBUMIN 4.3 3.8    TUMOR MARKERS: No results for input(s): "AFPTM", "CEA", "CA199", "CHROMGRNA" in the last 8760 hours.  Assessment and Plan: History of tobacco abuse and lung cancer. Pt had routine lung screening 05/29/21 that showed right lower lobe lung mass with concern for bilateral adrenal masses. PET scan 06/15/21 showed RLL soft tissue mass, bilateral hypermetabolic adrenal masses, pancreatic body and tail lesion, retroperitoneal nodule, soft tissue mass inferior to the transverse colon in midline and right mesenteric soft tissue mass. Pt was referred to IR for omental mass biopsy.    Pt resting on stretcher.  He is A&O, calm and pleasant.  He is in no distress.  Pt is NPO per order.  Risks and benefits of omental mass biopsy with moderate sedation was discussed with the patient and/or patient's family including, but not limited to  bleeding, infection, damage to adjacent structures or low yield requiring additional tests.  All of the questions were answered and there is agreement to proceed.  Consent signed and in chart.   Thank you for this interesting consult.  I greatly enjoyed meeting Maurice Simpson and look forward to participating in their care.  A copy of this report was sent to the requesting provider on this date.  Electronically Signed: Tyson Alias, NP 07/10/2021, 10:33 AM   I spent a total of 20 minutes in face to face in clinical consultation, greater than 50% of which was counseling/coordinating care for omental mass biopsy.

## 2021-07-10 ENCOUNTER — Other Ambulatory Visit: Payer: Self-pay

## 2021-07-10 ENCOUNTER — Ambulatory Visit: Payer: Medicare HMO

## 2021-07-10 ENCOUNTER — Ambulatory Visit
Admission: RE | Admit: 2021-07-10 | Discharge: 2021-07-10 | Disposition: A | Discharge status: Home / Self Care | Payer: Medicare HMO | Source: Ambulatory Visit | Attending: Oncology | Admitting: Oncology

## 2021-07-10 ENCOUNTER — Ambulatory Visit: Payer: Medicare HMO | Admitting: Oncology

## 2021-07-10 DIAGNOSIS — R1900 Intra-abdominal and pelvic swelling, mass and lump, unspecified site: Secondary | ICD-10-CM | POA: Diagnosis not present

## 2021-07-10 DIAGNOSIS — F17210 Nicotine dependence, cigarettes, uncomplicated: Secondary | ICD-10-CM | POA: Insufficient documentation

## 2021-07-10 DIAGNOSIS — R599 Enlarged lymph nodes, unspecified: Secondary | ICD-10-CM | POA: Diagnosis not present

## 2021-07-10 DIAGNOSIS — E279 Disorder of adrenal gland, unspecified: Secondary | ICD-10-CM | POA: Insufficient documentation

## 2021-07-10 DIAGNOSIS — C772 Secondary and unspecified malignant neoplasm of intra-abdominal lymph nodes: Secondary | ICD-10-CM | POA: Diagnosis not present

## 2021-07-10 DIAGNOSIS — K668 Other specified disorders of peritoneum: Secondary | ICD-10-CM | POA: Diagnosis not present

## 2021-07-10 DIAGNOSIS — R918 Other nonspecific abnormal finding of lung field: Secondary | ICD-10-CM | POA: Insufficient documentation

## 2021-07-10 DIAGNOSIS — Z85118 Personal history of other malignant neoplasm of bronchus and lung: Secondary | ICD-10-CM | POA: Diagnosis not present

## 2021-07-10 DIAGNOSIS — R1909 Other intra-abdominal and pelvic swelling, mass and lump: Secondary | ICD-10-CM | POA: Diagnosis not present

## 2021-07-10 LAB — CBC
HCT: 46.5 % (ref 39.0–52.0)
Hemoglobin: 16.7 g/dL (ref 13.0–17.0)
MCH: 31.6 pg (ref 26.0–34.0)
MCHC: 35.9 g/dL (ref 30.0–36.0)
MCV: 87.9 fL (ref 80.0–100.0)
Platelets: 215 10*3/uL (ref 150–400)
RBC: 5.29 MIL/uL (ref 4.22–5.81)
RDW: 12.8 % (ref 11.5–15.5)
WBC: 18.4 10*3/uL — ABNORMAL HIGH (ref 4.0–10.5)
nRBC: 0.1 % (ref 0.0–0.2)

## 2021-07-10 LAB — PROTIME-INR
INR: 1 (ref 0.8–1.2)
Prothrombin Time: 13.5 seconds (ref 11.4–15.2)

## 2021-07-10 MED ORDER — MIDAZOLAM HCL 2 MG/2ML IJ SOLN
INTRAMUSCULAR | Status: AC | PRN
  Administered 2021-07-10: 1 mg via INTRAVENOUS

## 2021-07-10 MED ORDER — FENTANYL CITRATE (PF) 100 MCG/2ML IJ SOLN
INTRAMUSCULAR | Status: AC
  Filled 2021-07-10: qty 2

## 2021-07-10 MED ORDER — SODIUM CHLORIDE 0.9 % IV SOLN: INTRAVENOUS | Status: DC

## 2021-07-10 MED ORDER — MIDAZOLAM HCL 2 MG/2ML IJ SOLN
INTRAMUSCULAR | Status: AC
  Filled 2021-07-10: qty 4

## 2021-07-10 MED ORDER — FENTANYL CITRATE (PF) 100 MCG/2ML IJ SOLN
INTRAMUSCULAR | Status: AC | PRN
  Administered 2021-07-10: 50 ug via INTRAVENOUS

## 2021-07-10 NOTE — Procedures (Signed)
Interventional Radiology Procedure Note  Date of Procedure: 07/10/2021  Procedure: CT biopsy of abdominal mass   Findings:  1. CT biopsy of abdominal mass, 18ga x6 cores    Complications: No immediate complications noted.   Estimated Blood Loss: minimal  Follow-up and Recommendations: 1. Bedrest 1 hour    Albin Felling, MD  Vascular & Interventional Radiology  07/10/2021 12:32 PM

## 2021-07-13 ENCOUNTER — Ambulatory Visit (INDEPENDENT_AMBULATORY_CARE_PROVIDER_SITE_OTHER): Payer: Medicare HMO | Admitting: *Deleted

## 2021-07-13 ENCOUNTER — Ambulatory Visit: Payer: Medicare HMO

## 2021-07-13 ENCOUNTER — Telehealth: Payer: Self-pay | Admitting: Nurse Practitioner

## 2021-07-13 ENCOUNTER — Other Ambulatory Visit: Payer: Self-pay

## 2021-07-13 DIAGNOSIS — Z510 Encounter for antineoplastic radiation therapy: Secondary | ICD-10-CM | POA: Insufficient documentation

## 2021-07-13 DIAGNOSIS — Z5112 Encounter for antineoplastic immunotherapy: Secondary | ICD-10-CM | POA: Insufficient documentation

## 2021-07-13 DIAGNOSIS — C7889 Secondary malignant neoplasm of other digestive organs: Secondary | ICD-10-CM | POA: Diagnosis not present

## 2021-07-13 DIAGNOSIS — C801 Malignant (primary) neoplasm, unspecified: Secondary | ICD-10-CM | POA: Insufficient documentation

## 2021-07-13 DIAGNOSIS — Z79899 Other long term (current) drug therapy: Secondary | ICD-10-CM | POA: Insufficient documentation

## 2021-07-13 DIAGNOSIS — Z0000 Encounter for general adult medical examination without abnormal findings: Secondary | ICD-10-CM | POA: Diagnosis not present

## 2021-07-13 DIAGNOSIS — C7971 Secondary malignant neoplasm of right adrenal gland: Secondary | ICD-10-CM | POA: Diagnosis not present

## 2021-07-13 DIAGNOSIS — C7931 Secondary malignant neoplasm of brain: Secondary | ICD-10-CM | POA: Insufficient documentation

## 2021-07-13 DIAGNOSIS — C7972 Secondary malignant neoplasm of left adrenal gland: Secondary | ICD-10-CM | POA: Insufficient documentation

## 2021-07-13 DIAGNOSIS — F17210 Nicotine dependence, cigarettes, uncomplicated: Secondary | ICD-10-CM | POA: Diagnosis not present

## 2021-07-13 DIAGNOSIS — Z5111 Encounter for antineoplastic chemotherapy: Secondary | ICD-10-CM | POA: Insufficient documentation

## 2021-07-13 DIAGNOSIS — C786 Secondary malignant neoplasm of retroperitoneum and peritoneum: Secondary | ICD-10-CM | POA: Insufficient documentation

## 2021-07-13 DIAGNOSIS — C3431 Malignant neoplasm of lower lobe, right bronchus or lung: Secondary | ICD-10-CM | POA: Diagnosis not present

## 2021-07-13 LAB — RAD ONC ARIA SESSION SUMMARY
Course Elapsed Days: 5
Plan Fractions Treated to Date: 3
Plan Prescribed Dose Per Fraction: 3 Gy
Plan Total Fractions Prescribed: 10
Plan Total Prescribed Dose: 30 Gy
Reference Point Dosage Given to Date: 9 Gy
Reference Point Session Dosage Given: 3 Gy
Session Number: 3

## 2021-07-13 NOTE — Telephone Encounter (Signed)
Copied from [id number]. Topic: General - Other >> Jul 13, 2021 10:29 AM [place] B wrote: Reason for HJS:[id number] wife called in to cancel AWV because patient is busy with radiation treatments.

## 2021-07-13 NOTE — Progress Notes (Signed)
Subjective:   Maurice Simpson is a 66 y.o. male who presents for an Initial Medicare Annual Wellness Visit.  I connected with  Maurice Simpson on 07/13/21 by a telephone enabled telemedicine application and verified that I am speaking with the correct person using two identifiers.   I discussed the limitations of evaluation and management by telemedicine. The patient expressed understanding and agreed to proceed.  Patient location: home  Provider location: Tele-health-home    Review of Systems           Objective:    There were no vitals filed for this visit. There is no height or weight on file to calculate BMI.     07/01/2021   11:03 AM 07/31/2019    9:21 PM  Advanced Directives  Does Patient Have a Medical Advance Directive? No No  Would patient like information on creating a medical advance directive? No - Patient declined No - Patient declined    Current Medications (verified) Outpatient Encounter Medications as of 07/13/2021  Medication Sig   albuterol (VENTOLIN HFA) 108 (90 Base) MCG/ACT inhaler Inhale 2 puffs into the lungs every 6 (six) hours as needed for wheezing or shortness of breath.   dexamethasone (DECADRON) 4 MG tablet Take 1 tablet (4 mg total) by mouth 3 (three) times daily.   EPINEPHrine 0.3 mg/0.3 mL IJ SOAJ injection Inject 0.3 mg into the muscle as needed for anaphylaxis. (Patient not taking: Reported on 07/01/2021)   Glycopyrrolate-Formoterol (BEVESPI AEROSPHERE) 9-4.8 MCG/ACT AERO Inhale 2 puffs into the lungs daily.   loratadine (CLARITIN) 10 MG tablet Take 1 tablet (10 mg total) by mouth daily.   ondansetron (ZOFRAN) 4 MG tablet Take 1 tablet (4 mg total) by mouth every 8 (eight) hours as needed for nausea or vomiting.   oxyCODONE (OXY IR/ROXICODONE) 5 MG immediate release tablet Take 1 tablet (5 mg total) by mouth every 4 (four) hours as needed for severe pain.   pantoprazole (PROTONIX) 20 MG tablet Take 1 tablet (20 mg total) by mouth daily.    prochlorperazine (COMPAZINE) 10 MG tablet Take 1 tablet (10 mg total) by mouth every 6 (six) hours as needed for nausea or vomiting.   rosuvastatin (CRESTOR) 10 MG tablet Take 1 tablet (10 mg total) by mouth daily.   No facility-administered encounter medications on file as of 07/13/2021.    Allergies (verified) Bee venom   History: No past medical history on file. Past Surgical History:  Procedure Laterality Date   CATARACT EXTRACTION Right 03/10/2021   Family History  Problem Relation Age of Onset   Diabetes Mother    Hypertension Mother    Diabetes Father    Hypertension Father    Diabetes Sister    Cancer - Colon Cousin    Social History   Socioeconomic History   Marital status: Married    Spouse name: Not on file   Number of children: Not on file   Years of education: Not on file   Highest education level: Not on file  Occupational History   Not on file  Tobacco Use   Smoking status: Every Day    Packs/day: 2.00    Years: 51.00    Total pack years: 102.00    Types: Cigarettes   Smokeless tobacco: Never  Vaping Use   Vaping Use: Never used  Substance and Sexual Activity   Alcohol use: Not Currently   Drug use: Never   Sexual activity: Yes  Other Topics Concern   Not  on file  Social History Narrative   Not on file   Social Determinants of Health   Financial Resource Strain: Low Risk  (07/20/2019)   Overall Financial Resource Strain (CARDIA)    Difficulty of Paying Living Expenses: Not hard at all  Food Insecurity: No Food Insecurity (07/20/2019)   Hunger Vital Sign    Worried About Running Out of Food in the Last Year: Never true    Ran Out of Food in the Last Year: Never true  Transportation Needs: No Transportation Needs (07/20/2019)   PRAPARE - Hydrologist (Medical): No    Lack of Transportation (Non-Medical): No  Physical Activity: Inactive (07/20/2019)   Exercise Vital Sign    Days of Exercise per Week: 0 days    Minutes  of Exercise per Session: 0 min  Stress: Stress Concern Present (07/20/2019)   [place]    Feeling of Stress : To some extent  Social Connections: Moderately Integrated (07/20/2019)   Social Connection and Isolation Panel [NHANES]    Frequency of Communication with Friends and Family: Three times a week    Frequency of Social Gatherings with Friends and Family: Three times a week    Attends Religious Services: 1 to 4 times per year    Active Member of Clubs or Organizations: No    Attends Archivist Meetings: Never    Marital Status: Married    Tobacco Counseling Ready to quit: Not Answered Counseling given: Not Answered   Clinical Intake:                 Diabetic?  no         Activities of Daily Living    07/10/2021   10:02 AM  In your present state of health, do you have any difficulty performing the following activities:  Hearing? 0  Vision? 0  Difficulty concentrating or making decisions? 0  Walking or climbing stairs? 0  Dressing or bathing? 0    Patient Care Team: [name], NP as PCP - General (Nurse Practitioner) Telford Nab, RN as Oncology Nurse Navigator  Indicate any recent Medical Services you may have received from other than [facility] providers in the past year (date may be approximate).     Assessment:   This is a routine wellness examination for [name].  Hearing/Vision screen No results found.  Dietary issues and exercise activities discussed:     Goals Addressed   None    Depression Screen    06/24/2021    3:34 PM 05/12/2021   11:31 AM 04/30/2021   11:21 AM 07/20/2019   10:12 AM  PHQ 2/9 Scores  PHQ - 2 Score 0 0 0 0  PHQ- 9 Score 6 2 6      Fall Risk    06/24/2021    3:33 PM 05/12/2021   11:30 AM 04/30/2021   11:15 AM 07/20/2019   10:12 AM  Fall Risk   Falls in the past year? 0 0 0 0  Number falls in past yr: 0 0 0 0  Injury with Fall? 0 0 0 0   Risk for fall due to : No Fall Risks No Fall Risks No Fall Risks   Follow up Falls evaluation completed Falls evaluation completed Falls evaluation completed Falls evaluation completed    [place]:  Any stairs in or around the home? Yes  If so, are there any  without handrails? No  Home free of loose throw rugs in walkways, pet beds, electrical cords, etc? Yes  Adequate lighting in your home to reduce risk of falls? Yes   ASSISTIVE DEVICES UTILIZED TO PREVENT FALLS:  Life alert? No  Use of a cane, walker or w/c? No  Grab bars in the bathroom? Yes  Shower chair or bench in shower? Yes  Elevated toilet seat or a handicapped toilet? Yes   TIMED UP AND GO:  Was the test performed? No .    Cognitive Function:        Immunizations  There is no immunization history on file for this patient.  TDAP status: Due, Education has been provided regarding the importance of this vaccine. Advised may receive this vaccine at local pharmacy or Health Dept. Aware to provide a copy of the vaccination record if obtained from local pharmacy or Health Dept. Verbalized acceptance and understanding.  Flu Vaccine status: Declined, Education has been provided regarding the importance of this vaccine but patient still declined. Advised may receive this vaccine at local pharmacy or Health Dept. Aware to provide a copy of the vaccination record if obtained from local pharmacy or Health Dept. Verbalized acceptance and understanding.  Pneumococcal vaccine status: Declined,  Education has been provided regarding the importance of this vaccine but patient still declined. Advised may receive this vaccine at local pharmacy or Health Dept. Aware to provide a copy of the vaccination record if obtained from local pharmacy or Health Dept. Verbalized acceptance and understanding.   Covid-19 vaccine status: Information provided on how to obtain vaccines.   Qualifies for Shingles  Vaccine? Yes   Zostavax completed No   Shingrix Completed?: No.    Education has been provided regarding the importance of this vaccine. Patient has been advised to call insurance company to determine out of pocket expense if they have not yet received this vaccine. Advised may also receive vaccine at local pharmacy or Health Dept. Verbalized acceptance and understanding.  Screening Tests Health Maintenance  Topic Date Due   TETANUS/TDAP  Never done   Zoster Vaccines- Shingrix (1 of 2) 08/03/2021 (Originally 04/09/1974)   Pneumonia Vaccine 48+ Years old (1 - PCV) 05/01/2022 (Originally 04/08/1961)   COLONOSCOPY (Pts 45-43yrs Insurance coverage will need to be confirmed)  05/05/2022 (Originally 04/08/2000)   INFLUENZA VACCINE  08/11/2021   Hepatitis C Screening  Completed   HPV VACCINES  Aged Out   COVID-19 Vaccine  Discontinued    Health Maintenance  Health Maintenance Due  Topic Date Due   TETANUS/TDAP  Never done   Colonoscopy  patient will call for a referral when he is finished with treatments and is ready to schedule   Lung Cancer Screening: (Low Dose CT Chest recommended if Age 37-80 years, 30 pack-year currently smoking OR have quit w/in 15years.)  qualify.   Lung Cancer Screening Referral: patient had in 2023 undergoing treatment  Additional Screening:  Hepatitis C Screening: does not qualify; Completed 2023  Vision Screening: Recommended annual ophthalmology exams for early detection of glaucoma and other disorders of the eye. Is the patient up to date with their annual eye exam?  Yes  Who is the provider or what is the name of the office in which the patient attends annual eye exams? Unsure of name If pt is not established with a provider, would they like to be referred to a provider to establish care? No .   Dental Screening: Recommended annual dental exams for proper oral hygiene  Community Resource Referral / Chronic Care Management: CRR required this visit?  No    CCM required this visit?  No      Plan:     I have personally reviewed and noted the following in the patient's chart:   Medical and social history Use of alcohol, tobacco or illicit drugs  Current medications and supplements including opioid prescriptions. Patient is currently taking opioid prescriptions. Information provided to patient regarding non-opioid alternatives. Patient advised to discuss non-opioid treatment plan with their provider. Functional ability and status Nutritional status Physical activity Advanced directives List of other physicians Hospitalizations, surgeries, and ER visits in previous 12 months Vitals Screenings to include cognitive, depression, and falls Referrals and appointments  In addition, I have reviewed and discussed with patient certain preventive protocols, quality metrics, and best practice recommendations. A written personalized care plan for preventive services as well as general preventive health recommendations were provided to patient.     Leroy Kennedy, LPN   08/19/2798   Nurse Notes:

## 2021-07-13 NOTE — Patient Instructions (Signed)
Mr. Maurice Simpson , Thank you for taking time to come for your Medicare Wellness Visit. I appreciate your ongoing commitment to your health goals. Please review the following plan we discussed and let me know if I can assist you in the future.   Screening recommendations/referrals: Colonoscopy: Education provided Recommended yearly ophthalmology/optometry visit for glaucoma screening and checkup Recommended yearly dental visit for hygiene and checkup  Vaccinations: Influenza vaccine: Education provided Pneumococcal vaccine: Education provided Tdap vaccine: Education provided Shingles vaccine: Education provided    Advanced directives: Education provided  Conditions/risks identified:   Next appointment: 09-24-2021 @ 10:40 [name]  Preventive Care 21 Years and Older, Male Preventive care refers to lifestyle choices and visits with your health care provider that can promote health and wellness. What does preventive care include? A yearly physical exam. This is also called an annual well check. Dental exams once or twice a year. Routine eye exams. Ask your health care provider how often you should have your eyes checked. Personal lifestyle choices, including: Daily care of your teeth and gums. Regular physical activity. Eating a healthy diet. Avoiding tobacco and drug use. Limiting alcohol use. Practicing safe sex. Taking low doses of aspirin every day. Taking vitamin and mineral supplements as recommended by your health care provider. What happens during an annual well check? The services and screenings done by your health care provider during your annual well check will depend on your age, overall health, lifestyle risk factors, and family history of disease. Counseling  Your health care provider may ask you questions about your: Alcohol use. Tobacco use. Drug use. Emotional well-being. Home and relationship well-being. Sexual activity. Eating habits. History of falls. Memory and  ability to understand (cognition). Work and work Statistician. Screening  You may have the following tests or measurements: Height, weight, and BMI. Blood pressure. Lipid and cholesterol levels. These may be checked every 5 years, or more frequently if you are over 66 years old. Skin check. Lung cancer screening. You may have this screening every year starting at age 66 if you have a 30-pack-year history of smoking and currently smoke or have quit within the past 15 years. Fecal occult blood test (FOBT) of the stool. You may have this test every year starting at age 66. Flexible sigmoidoscopy or colonoscopy. You may have a sigmoidoscopy every 5 years or a colonoscopy every 10 years starting at age 66. Prostate cancer screening. Recommendations will vary depending on your family history and other risks. Hepatitis C blood test. Hepatitis B blood test. Sexually transmitted disease (STD) testing. Diabetes screening. This is done by checking your blood sugar (glucose) after you have not eaten for a while (fasting). You may have this done every 1-3 years. Abdominal aortic aneurysm (AAA) screening. You may need this if you are a current or former smoker. Osteoporosis. You may be screened starting at age 25 if you are at high risk. Talk with your health care provider about your test results, treatment options, and if necessary, the need for more tests. Vaccines  Your health care provider may recommend certain vaccines, such as: Influenza vaccine. This is recommended every year. Tetanus, diphtheria, and acellular pertussis (Tdap, Td) vaccine. You may need a Td booster every 10 years. Zoster vaccine. You may need this after age 25. Pneumococcal 13-valent conjugate (PCV13) vaccine. One dose is recommended after age 66. Pneumococcal polysaccharide (PPSV23) vaccine. One dose is recommended after age 66. Talk to your health care provider about which screenings and vaccines you need and how  often you need  them. This information is not intended to replace advice given to you by your health care provider. Make sure you discuss any questions you have with your health care provider. Document Released: 01/24/2015 Document Revised: 09/17/2015 Document Reviewed: 10/29/2014 Elsevier Interactive Patient Education  2017 [place] Prevention in the Home Falls can cause injuries. They can happen to people of all ages. There are many things you can do to make your home safe and to help prevent falls. What can I do on the outside of my home? Regularly fix the edges of walkways and driveways and fix any cracks. Remove anything that might make you trip as you walk through a door, such as a raised step or threshold. Trim any bushes or trees on the path to your home. Use bright outdoor lighting. Clear any walking paths of anything that might make someone trip, such as rocks or tools. Regularly check to see if handrails are loose or broken. Make sure that both sides of any steps have handrails. Any raised decks and porches should have guardrails on the edges. Have any leaves, snow, or ice cleared regularly. Use sand or salt on walking paths during winter. Clean up any spills in your garage right away. This includes oil or grease spills. What can I do in the bathroom? Use night lights. Install grab bars by the toilet and in the tub and shower. Do not use towel bars as grab bars. Use non-skid mats or decals in the tub or shower. If you need to sit down in the shower, use a plastic, non-slip stool. Keep the floor dry. Clean up any water that spills on the floor as soon as it happens. Remove soap buildup in the tub or shower regularly. Attach bath mats securely with double-sided non-slip rug tape. Do not have throw rugs and other things on the floor that can make you trip. What can I do in the bedroom? Use night lights. Make sure that you have a light by your bed that is easy to reach. Do not use  any sheets or blankets that are too big for your bed. They should not hang down onto the floor. Have a firm chair that has side arms. You can use this for support while you get dressed. Do not have throw rugs and other things on the floor that can make you trip. What can I do in the kitchen? Clean up any spills right away. Avoid walking on wet floors. Keep items that you use a lot in easy-to-reach places. If you need to reach something above you, use a strong step stool that has a grab bar. Keep electrical cords out of the way. Do not use floor polish or wax that makes floors slippery. If you must use wax, use non-skid floor wax. Do not have throw rugs and other things on the floor that can make you trip. What can I do with my stairs? Do not leave any items on the stairs. Make sure that there are handrails on both sides of the stairs and use them. Fix handrails that are broken or loose. Make sure that handrails are as long as the stairways. Check any carpeting to make sure that it is firmly attached to the stairs. Fix any carpet that is loose or worn. Avoid having throw rugs at the top or bottom of the stairs. If you do have throw rugs, attach them to the floor with carpet tape. Make sure that you have a  light switch at the top of the stairs and the bottom of the stairs. If you do not have them, ask someone to add them for you. What else can I do to help prevent falls? Wear shoes that: Do not have high heels. Have rubber bottoms. Are comfortable and fit you well. Are closed at the toe. Do not wear sandals. If you use a stepladder: Make sure that it is fully opened. Do not climb a closed stepladder. Make sure that both sides of the stepladder are locked into place. Ask someone to hold it for you, if possible. Clearly mark and make sure that you can see: Any grab bars or handrails. First and last steps. Where the edge of each step is. Use tools that help you move around (mobility aids)  if they are needed. These include: Canes. Walkers. Scooters. Crutches. Turn on the lights when you go into a dark area. Replace any light bulbs as soon as they burn out. Set up your furniture so you have a clear path. Avoid moving your furniture around. If any of your floors are uneven, fix them. If there are any pets around you, be aware of where they are. Review your medicines with your doctor. Some medicines can make you feel dizzy. This can increase your chance of falling. Ask your doctor what other things that you can do to help prevent falls. This information is not intended to replace advice given to you by your health care provider. Make sure you discuss any questions you have with your health care provider. Document Released: 10/24/2008 Document Revised: 06/05/2015 Document Reviewed: 02/01/2014 Elsevier Interactive Patient Education  2017 [employer].

## 2021-07-15 ENCOUNTER — Other Ambulatory Visit: Payer: Self-pay

## 2021-07-15 ENCOUNTER — Inpatient Hospital Stay: Payer: Medicare HMO | Attending: Oncology | Admitting: Hospice and Palliative Medicine

## 2021-07-15 ENCOUNTER — Ambulatory Visit: Payer: Medicare HMO

## 2021-07-15 DIAGNOSIS — C7972 Secondary malignant neoplasm of left adrenal gland: Secondary | ICD-10-CM | POA: Insufficient documentation

## 2021-07-15 DIAGNOSIS — Z5112 Encounter for antineoplastic immunotherapy: Secondary | ICD-10-CM | POA: Diagnosis not present

## 2021-07-15 DIAGNOSIS — C3431 Malignant neoplasm of lower lobe, right bronchus or lung: Secondary | ICD-10-CM | POA: Insufficient documentation

## 2021-07-15 DIAGNOSIS — Z79899 Other long term (current) drug therapy: Secondary | ICD-10-CM | POA: Insufficient documentation

## 2021-07-15 DIAGNOSIS — C7931 Secondary malignant neoplasm of brain: Secondary | ICD-10-CM | POA: Diagnosis not present

## 2021-07-15 DIAGNOSIS — C3490 Malignant neoplasm of unspecified part of unspecified bronchus or lung: Secondary | ICD-10-CM

## 2021-07-15 DIAGNOSIS — C786 Secondary malignant neoplasm of retroperitoneum and peritoneum: Secondary | ICD-10-CM | POA: Diagnosis not present

## 2021-07-15 DIAGNOSIS — C7889 Secondary malignant neoplasm of other digestive organs: Secondary | ICD-10-CM | POA: Insufficient documentation

## 2021-07-15 DIAGNOSIS — Z510 Encounter for antineoplastic radiation therapy: Secondary | ICD-10-CM | POA: Diagnosis not present

## 2021-07-15 DIAGNOSIS — C7971 Secondary malignant neoplasm of right adrenal gland: Secondary | ICD-10-CM | POA: Diagnosis not present

## 2021-07-15 DIAGNOSIS — Z5111 Encounter for antineoplastic chemotherapy: Secondary | ICD-10-CM | POA: Diagnosis not present

## 2021-07-15 DIAGNOSIS — F17210 Nicotine dependence, cigarettes, uncomplicated: Secondary | ICD-10-CM | POA: Insufficient documentation

## 2021-07-15 LAB — RAD ONC ARIA SESSION SUMMARY
Course Elapsed Days: 7
Plan Fractions Treated to Date: 4
Plan Prescribed Dose Per Fraction: 3 Gy
Plan Total Fractions Prescribed: 10
Plan Total Prescribed Dose: 30 Gy
Reference Point Dosage Given to Date: 12 Gy
Reference Point Session Dosage Given: 3 Gy
Session Number: 4

## 2021-07-15 NOTE — Progress Notes (Signed)
Multidisciplinary Oncology Council Documentation  Maurice Simpson was presented by our [facility] on 07/15/2021, which included representatives from:  Palliative Care Dietitian  Physical/Occupational Therapist Nurse Navigator Genetics Speech Therapist Social work Survivorship RN Financial Navigator Research RN   Eythan currently presents with history of metastatic lung disease  We reviewed previous medical and familial history, history of present illness, and recent lab results along with all available histopathologic and imaging studies. The [place] considered available treatment options and made the following recommendations/referrals:  Consider palliative care, nutrition  The MOC is a meeting of clinicians from various specialty areas who evaluate and discuss patients for whom a multidisciplinary approach is being considered. Final determinations in the plan of care are those of the provider(s).   Today's extended care, comprehensive team conference, [place] was not present for the discussion and was not examined.

## 2021-07-16 ENCOUNTER — Ambulatory Visit: Payer: Medicare HMO

## 2021-07-16 ENCOUNTER — Ambulatory Visit
Admission: RE | Admit: 2021-07-16 | Discharge: 2021-07-16 | Disposition: A | Discharge status: Home / Self Care | Payer: Medicare HMO | Source: Ambulatory Visit | Attending: Radiation Oncology | Admitting: Radiation Oncology

## 2021-07-16 ENCOUNTER — Other Ambulatory Visit: Payer: Self-pay

## 2021-07-16 DIAGNOSIS — C7931 Secondary malignant neoplasm of brain: Secondary | ICD-10-CM | POA: Diagnosis not present

## 2021-07-16 DIAGNOSIS — C786 Secondary malignant neoplasm of retroperitoneum and peritoneum: Secondary | ICD-10-CM | POA: Diagnosis not present

## 2021-07-16 DIAGNOSIS — C7971 Secondary malignant neoplasm of right adrenal gland: Secondary | ICD-10-CM | POA: Diagnosis not present

## 2021-07-16 DIAGNOSIS — C7972 Secondary malignant neoplasm of left adrenal gland: Secondary | ICD-10-CM | POA: Diagnosis not present

## 2021-07-16 DIAGNOSIS — Z5112 Encounter for antineoplastic immunotherapy: Secondary | ICD-10-CM | POA: Diagnosis not present

## 2021-07-16 DIAGNOSIS — Z5111 Encounter for antineoplastic chemotherapy: Secondary | ICD-10-CM | POA: Diagnosis not present

## 2021-07-16 DIAGNOSIS — C7889 Secondary malignant neoplasm of other digestive organs: Secondary | ICD-10-CM | POA: Diagnosis not present

## 2021-07-16 DIAGNOSIS — Z510 Encounter for antineoplastic radiation therapy: Secondary | ICD-10-CM | POA: Diagnosis not present

## 2021-07-16 DIAGNOSIS — C3431 Malignant neoplasm of lower lobe, right bronchus or lung: Secondary | ICD-10-CM | POA: Diagnosis not present

## 2021-07-16 LAB — RAD ONC ARIA SESSION SUMMARY
Course Elapsed Days: 8
Plan Fractions Treated to Date: 5
Plan Prescribed Dose Per Fraction: 3 Gy
Plan Total Fractions Prescribed: 10
Plan Total Prescribed Dose: 30 Gy
Reference Point Dosage Given to Date: 15 Gy
Reference Point Session Dosage Given: 3 Gy
Session Number: 5

## 2021-07-17 ENCOUNTER — Ambulatory Visit: Payer: Medicare HMO | Admitting: Oncology

## 2021-07-17 ENCOUNTER — Ambulatory Visit: Payer: Medicare HMO

## 2021-07-17 ENCOUNTER — Other Ambulatory Visit: Payer: Self-pay

## 2021-07-17 ENCOUNTER — Other Ambulatory Visit: Payer: Self-pay | Admitting: Pathology

## 2021-07-17 DIAGNOSIS — Z5111 Encounter for antineoplastic chemotherapy: Secondary | ICD-10-CM | POA: Diagnosis not present

## 2021-07-17 DIAGNOSIS — C7931 Secondary malignant neoplasm of brain: Secondary | ICD-10-CM | POA: Diagnosis not present

## 2021-07-17 DIAGNOSIS — C7972 Secondary malignant neoplasm of left adrenal gland: Secondary | ICD-10-CM | POA: Diagnosis not present

## 2021-07-17 DIAGNOSIS — C786 Secondary malignant neoplasm of retroperitoneum and peritoneum: Secondary | ICD-10-CM | POA: Diagnosis not present

## 2021-07-17 DIAGNOSIS — C3431 Malignant neoplasm of lower lobe, right bronchus or lung: Secondary | ICD-10-CM | POA: Diagnosis not present

## 2021-07-17 DIAGNOSIS — C7889 Secondary malignant neoplasm of other digestive organs: Secondary | ICD-10-CM | POA: Diagnosis not present

## 2021-07-17 DIAGNOSIS — Z510 Encounter for antineoplastic radiation therapy: Secondary | ICD-10-CM | POA: Diagnosis not present

## 2021-07-17 DIAGNOSIS — Z5112 Encounter for antineoplastic immunotherapy: Secondary | ICD-10-CM | POA: Diagnosis not present

## 2021-07-17 DIAGNOSIS — C7971 Secondary malignant neoplasm of right adrenal gland: Secondary | ICD-10-CM | POA: Diagnosis not present

## 2021-07-17 LAB — RAD ONC ARIA SESSION SUMMARY
Course Elapsed Days: 9
Plan Fractions Treated to Date: 6
Plan Prescribed Dose Per Fraction: 3 Gy
Plan Total Fractions Prescribed: 10
Plan Total Prescribed Dose: 30 Gy
Reference Point Dosage Given to Date: 18 Gy
Reference Point Session Dosage Given: 3 Gy
Session Number: 6

## 2021-07-17 LAB — SURGICAL PATHOLOGY

## 2021-07-20 ENCOUNTER — Other Ambulatory Visit: Payer: Self-pay

## 2021-07-20 ENCOUNTER — Ambulatory Visit: Payer: Medicare HMO

## 2021-07-20 ENCOUNTER — Other Ambulatory Visit: Payer: Self-pay | Admitting: *Deleted

## 2021-07-20 DIAGNOSIS — C7889 Secondary malignant neoplasm of other digestive organs: Secondary | ICD-10-CM | POA: Diagnosis not present

## 2021-07-20 DIAGNOSIS — Z510 Encounter for antineoplastic radiation therapy: Secondary | ICD-10-CM | POA: Diagnosis not present

## 2021-07-20 DIAGNOSIS — Z5111 Encounter for antineoplastic chemotherapy: Secondary | ICD-10-CM | POA: Diagnosis not present

## 2021-07-20 DIAGNOSIS — C7931 Secondary malignant neoplasm of brain: Secondary | ICD-10-CM | POA: Diagnosis not present

## 2021-07-20 DIAGNOSIS — C7971 Secondary malignant neoplasm of right adrenal gland: Secondary | ICD-10-CM | POA: Diagnosis not present

## 2021-07-20 DIAGNOSIS — C3431 Malignant neoplasm of lower lobe, right bronchus or lung: Secondary | ICD-10-CM | POA: Diagnosis not present

## 2021-07-20 DIAGNOSIS — C786 Secondary malignant neoplasm of retroperitoneum and peritoneum: Secondary | ICD-10-CM | POA: Diagnosis not present

## 2021-07-20 DIAGNOSIS — C7972 Secondary malignant neoplasm of left adrenal gland: Secondary | ICD-10-CM | POA: Diagnosis not present

## 2021-07-20 DIAGNOSIS — Z5112 Encounter for antineoplastic immunotherapy: Secondary | ICD-10-CM | POA: Diagnosis not present

## 2021-07-20 LAB — RAD ONC ARIA SESSION SUMMARY
Course Elapsed Days: 12
Plan Fractions Treated to Date: 7
Plan Prescribed Dose Per Fraction: 3 Gy
Plan Total Fractions Prescribed: 10
Plan Total Prescribed Dose: 30 Gy
Reference Point Dosage Given to Date: 21 Gy
Reference Point Session Dosage Given: 3 Gy
Session Number: 7

## 2021-07-20 MED ORDER — DEXAMETHASONE 4 MG PO TABS: ORAL_TABLET | ORAL | 0 refills | Status: DC

## 2021-07-21 ENCOUNTER — Other Ambulatory Visit: Payer: Self-pay

## 2021-07-21 ENCOUNTER — Inpatient Hospital Stay: Payer: Medicare HMO | Admitting: Oncology

## 2021-07-21 ENCOUNTER — Ambulatory Visit: Payer: Medicare HMO

## 2021-07-21 ENCOUNTER — Encounter: Payer: Self-pay | Admitting: Oncology

## 2021-07-21 ENCOUNTER — Encounter: Payer: Self-pay | Admitting: *Deleted

## 2021-07-21 VITALS — Temp 99.5°F | Resp 18 | Wt 212.6 lb

## 2021-07-21 DIAGNOSIS — Z5111 Encounter for antineoplastic chemotherapy: Secondary | ICD-10-CM | POA: Diagnosis not present

## 2021-07-21 DIAGNOSIS — Z7189 Other specified counseling: Secondary | ICD-10-CM

## 2021-07-21 DIAGNOSIS — C7971 Secondary malignant neoplasm of right adrenal gland: Secondary | ICD-10-CM | POA: Diagnosis not present

## 2021-07-21 DIAGNOSIS — C3490 Malignant neoplasm of unspecified part of unspecified bronchus or lung: Secondary | ICD-10-CM | POA: Diagnosis not present

## 2021-07-21 DIAGNOSIS — Z5112 Encounter for antineoplastic immunotherapy: Secondary | ICD-10-CM | POA: Diagnosis not present

## 2021-07-21 DIAGNOSIS — C3431 Malignant neoplasm of lower lobe, right bronchus or lung: Secondary | ICD-10-CM | POA: Diagnosis not present

## 2021-07-21 DIAGNOSIS — C786 Secondary malignant neoplasm of retroperitoneum and peritoneum: Secondary | ICD-10-CM | POA: Diagnosis not present

## 2021-07-21 DIAGNOSIS — Z510 Encounter for antineoplastic radiation therapy: Secondary | ICD-10-CM | POA: Diagnosis not present

## 2021-07-21 DIAGNOSIS — C7889 Secondary malignant neoplasm of other digestive organs: Secondary | ICD-10-CM | POA: Diagnosis not present

## 2021-07-21 DIAGNOSIS — C7931 Secondary malignant neoplasm of brain: Secondary | ICD-10-CM | POA: Diagnosis not present

## 2021-07-21 DIAGNOSIS — C7972 Secondary malignant neoplasm of left adrenal gland: Secondary | ICD-10-CM | POA: Diagnosis not present

## 2021-07-21 LAB — RAD ONC ARIA SESSION SUMMARY
Course Elapsed Days: 13
Plan Fractions Treated to Date: 8
Plan Prescribed Dose Per Fraction: 3 Gy
Plan Total Fractions Prescribed: 10
Plan Total Prescribed Dose: 30 Gy
Reference Point Dosage Given to Date: 24 Gy
Reference Point Session Dosage Given: 3 Gy
Session Number: 8

## 2021-07-21 MED ORDER — LIDOCAINE-PRILOCAINE 2.5-2.5 % EX CREA: TOPICAL_CREAM | CUTANEOUS | 3 refills | Status: DC

## 2021-07-21 MED ORDER — PANTOPRAZOLE SODIUM 20 MG PO TBEC: 20.0000 mg | DELAYED_RELEASE_TABLET | Freq: Every day | ORAL | 2 refills | Status: DC

## 2021-07-21 MED ORDER — TRAZODONE HCL 50 MG PO TABS: 50.0000 mg | ORAL_TABLET | Freq: Every evening | ORAL | 2 refills | Status: DC | PRN

## 2021-07-21 MED ORDER — PANTOPRAZOLE SODIUM 20 MG PO TBEC: 20.0000 mg | DELAYED_RELEASE_TABLET | Freq: Every day | ORAL | 0 refills | Status: DC

## 2021-07-21 MED ORDER — LORAZEPAM 0.5 MG PO TABS: 0.5000 mg | ORAL_TABLET | Freq: Four times a day (QID) | ORAL | 0 refills | Status: DC | PRN

## 2021-07-21 NOTE — Progress Notes (Signed)
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  He had a lung cancer screening protocol on 05/29/2021 which showed a right lower lobe lung mass with concern for bilateral adrenal masses.  This was followed by a PET CT scan on 06/15/2021 which showed a right lower lobe soft tissue mass with an SUV uptake of 16.6.  No hypermetabolic hilar or mediastinal adenopathy.  Bilateral hypermetabolic adrenal masses 6.7 and 5.1 cm respectively.  2.8 cm soft tissue lesion in the body of the pancreas and 1.9 cm soft tissue lesion in the tail of the pancreas.  2.3 cm retroperitoneal soft tissue nodule.  10 cm x 6.4 cm soft tissue mass inferior to the transverse colon in the midline.  4.1 x 2.4 cm right mesenteric soft tissue mass with an SUV of 11.7.  MRI brain was significant for multiple areas of intracranial metastatic disease.  Largest lesion in the left frontal lobe with significant edema internal hemorrhage and is causing a mass effect with 6 mm midline shift.  30 metastatic foci seen in the brain.  Patient is undergoing whole brain radiation for the same.    Abdominal mass biopsy showed high-grade carcinoma compatible with metastatic small cell carcinoma of lung origin.  Interval history-patient will be completing radiation therapy on 07/31/2021.  He is on a steroid taper at this time and dizziness still persists although somewhat improved.  He is able to  walk around the house but not too far.  Chest wall and abdominal pain is not well controlled with present dose of oxycodone 5 mg.  ECOG PS- 1-2 Pain scale- 3   Review of systems- Review of Systems  Constitutional:  Positive for malaise/fatigue. Negative for chills, fever and weight loss.  HENT:  Negative for congestion, ear discharge and nosebleeds.   Eyes:  Negative for blurred vision.  Respiratory:  Negative for cough, hemoptysis, sputum production, shortness of breath and wheezing.   Cardiovascular:  Negative for chest pain, palpitations, orthopnea and claudication.  Gastrointestinal:  Positive for abdominal pain. Negative for blood in stool, constipation, diarrhea, heartburn, melena, nausea and vomiting.  Genitourinary:  Negative for dysuria, flank pain, frequency, hematuria and urgency.  Musculoskeletal:  Negative for back pain, joint pain and myalgias.  Skin:  Negative for rash.  Neurological:  Positive for dizziness. Negative for tingling, focal weakness, seizures, weakness and headaches.  Endo/Heme/Allergies:  Does not bruise/bleed easily.  Psychiatric/Behavioral:  Negative for depression and suicidal ideas. The patient does not have insomnia.       Allergies  Allergen Reactions   Bee Venom      No past medical history on file.   Past Surgical History:  Procedure Laterality Date   CATARACT EXTRACTION Right 03/10/2021    Social History   Socioeconomic History   Marital status: Married    Spouse name: Not on file   Number of children: Not on file   Years of education: Not on file   Highest education level: Not on file  Occupational History  Not on file  Tobacco Use   Smoking status: Every Day    Packs/day: 2.00    Years: 51.00    Total pack years: 102.00    Types: Cigarettes   Smokeless tobacco: Never  Vaping Use   Vaping Use: Never used  Substance and Sexual Activity   Alcohol use: Not Currently   Drug use: Never   Sexual activity: Yes  Other Topics  Concern   Not on file  Social History Narrative   Not on file   Social Determinants of Health   Financial Resource Strain: Low Risk  (07/13/2021)   Overall Financial Resource Strain (CARDIA)    Difficulty of Paying Living Expenses: Not hard at all  Food Insecurity: No Food Insecurity (07/13/2021)   Hunger Vital Sign    Worried About Running Out of Food in the Last Year: Never true    Ran Out of Food in the Last Year: Never true  Transportation Needs: No Transportation Needs (07/13/2021)   PRAPARE - Hydrologist (Medical): No    Lack of Transportation (Non-Medical): No  Physical Activity: Inactive (07/20/2019)   Exercise Vital Sign    Days of Exercise per Week: 0 days    Minutes of Exercise per Session: 0 min  Stress: Stress Concern Present (07/13/2021)   Temperance    Feeling of Stress : To some extent  Social Connections: Moderately Isolated (07/13/2021)   Social Connection and Isolation Panel [NHANES]    Frequency of Communication with Friends and Family: More than three times a week    Frequency of Social Gatherings with Friends and Family: Twice a week    Attends Religious Services: Never    Marine scientist or Organizations: No    Attends Archivist Meetings: Never    Marital Status: Married  Human resources officer Violence: Not At Risk (07/13/2021)   Humiliation, Afraid, Rape, and Kick questionnaire    Fear of Current or Ex-Partner: No    Emotionally Abused: No    Physically Abused: No    Sexually Abused: No    Family History  Problem Relation Age of Onset   Diabetes Mother    Hypertension Mother    Diabetes Father    Hypertension Father    Diabetes Sister    Cancer - Colon Cousin      Current Outpatient Medications:    albuterol (VENTOLIN HFA) 108 (90 Base) MCG/ACT inhaler, Inhale 2 puffs into the lungs every 6 (six) hours as needed for wheezing or shortness of  breath., Disp: 8 g, Rfl: 1   dexamethasone (DECADRON) 4 MG tablet, Take 1 tablet by mouth twice a day for 7 days, then take 1 tablet by mouth daily for 7 days, then take 0.5 tablet by mouth daily for 7 days, then stop, Disp: 30 tablet, Rfl: 0   EPINEPHrine 0.3 mg/0.3 mL IJ SOAJ injection, Inject 0.3 mg into the muscle as needed for anaphylaxis. (Patient not taking: Reported on 07/01/2021), Disp: 1 each, Rfl: 4   Glycopyrrolate-Formoterol (BEVESPI AEROSPHERE) 9-4.8 MCG/ACT AERO, Inhale 2 puffs into the lungs daily., Disp: 10.7 g, Rfl: 12   loratadine (CLARITIN) 10 MG tablet, Take 1 tablet (10 mg total) by mouth daily., Disp: 30 tablet, Rfl: 11   ondansetron (ZOFRAN) 4 MG tablet, Take 1 tablet (4 mg total) by mouth every 8 (eight) hours as needed for nausea or vomiting., Disp: 60 tablet, Rfl: 3   oxyCODONE (  OXY IR/ROXICODONE) 5 MG immediate release tablet, Take 1 tablet (5 mg total) by mouth every 4 (four) hours as needed for severe pain., Disp: 60 tablet, Rfl: 0   pantoprazole (PROTONIX) 20 MG tablet, Take 1 tablet (20 mg total) by mouth daily., Disp: 30 tablet, Rfl: 0   prochlorperazine (COMPAZINE) 10 MG tablet, Take 1 tablet (10 mg total) by mouth every 6 (six) hours as needed for nausea or vomiting., Disp: 60 tablet, Rfl: 2   rosuvastatin (CRESTOR) 10 MG tablet, Take 1 tablet (10 mg total) by mouth daily., Disp: 90 tablet, Rfl: 4  Physical exam:  Vitals:   07/21/21 1308  Resp: 18  Temp: 99.5 F (37.5 C)  Weight: 212 lb 9.6 oz (96.4 kg)   Physical Exam Constitutional:      Comments: Sitting in the wheelchair.  Appears in no acute distress  Cardiovascular:     Rate and Rhythm: Normal rate and regular rhythm.     Heart sounds: Normal heart sounds.  Pulmonary:     Effort: Pulmonary effort is normal.     Breath sounds: Normal breath sounds.  Skin:    General: Skin is warm and dry.  Neurological:     Mental Status: He is alert and oriented to person, place, and time.         Latest Ref  Rng & Units 07/01/2021   11:57 AM  CMP  Glucose 70 - 99 mg/dL 108   BUN 8 - 23 mg/dL 10   Creatinine 0.61 - 1.24 mg/dL 1.01   Sodium 135 - 145 mmol/L 135   Potassium 3.5 - 5.1 mmol/L 3.8   Chloride 98 - 111 mmol/L 100   CO2 22 - 32 mmol/L 26   Calcium 8.9 - 10.3 mg/dL 9.0   Total Protein 6.5 - 8.1 g/dL 7.5   Total Bilirubin 0.3 - 1.2 mg/dL 1.0   Alkaline Phos 38 - 126 U/L 50   AST 15 - 41 U/L 32   ALT 0 - 44 U/L 17       Latest Ref Rng & Units 07/10/2021   11:00 AM  CBC  WBC 4.0 - 10.5 K/uL 18.4   Hemoglobin 13.0 - 17.0 g/dL 16.7   Hematocrit 39.0 - 52.0 % 46.5   Platelets 150 - 400 K/uL 215     No images are attached to the encounter.  CT ABDOMINAL MASS BIOPSY  Result Date: 07/10/2021 INDICATION: Abdominal mass EXAM: CT-guided core needle biopsy of abdominal mass MEDICATIONS: None. ANESTHESIA/SEDATION: Moderate (conscious) sedation was employed during this procedure. A total of Versed 1 mg and Fentanyl 50 mcg was administered intravenously. Moderate Sedation Time: 20 minutes. The patient's level of consciousness and vital signs were monitored continuously by radiology nursing throughout the procedure under my direct supervision. FLUOROSCOPY TIME:  N/a COMPLICATIONS: None immediate. PROCEDURE: Informed written consent was obtained from the patient after a thorough discussion of the procedural risks, benefits and alternatives. All questions were addressed. Maximal Sterile Barrier Technique was utilized including caps, mask, sterile gowns, sterile gloves, sterile drape, hand hygiene and skin antiseptic. A timeout was performed prior to the initiation of the procedure. The patient was placed supine on the exam table. Limited CT of the abdomen and pelvis was performed for planning purposes, which again demonstrated a large central anterior abdominopelvic mass. Skin entry site was marked, and the overlying skin was prepped and draped in the standard sterile fashion. Local analgesia was  obtained with 1% lidocaine. Using intermittent CT fluoroscopy, a  71 gauge introducer needle was advanced towards the mass. Prior to puncture of the peritoneal cavity, a loop of small bowel was noted to shift anterior to the mass. Skin entry site was replanned superior to this loop of bowel, and local analgesia was readministered. Again using CT fluoroscopy, a 17 gauge introducer needle was advanced into the abdominopelvic mass. Location was confirmed with CT. Subsequently, core needle biopsy was performed using an 18 gauge core biopsy device x6 total passes. Specimens were submitted in saline to pathology for further handling. Limited postprocedure imaging demonstrated no complicating features. The patient tolerated the procedure well without immediate complication. IMPRESSION: Successful CT-guided core needle biopsy of central abdominopelvic mass. Electronically Signed   By: Albin Felling M.D.   On: 07/10/2021 13:21   MR Brain W Wo Contrast  Result Date: 07/02/2021 CLINICAL DATA:  Lung mass.  Metastatic disease evaluation EXAM: MRI HEAD WITHOUT AND WITH CONTRAST TECHNIQUE: Multiplanar, multiecho pulse sequences of the brain and surrounding structures were obtained without and with intravenous contrast. CONTRAST:  68mL GADAVIST GADOBUTROL 1 MMOL/ML IV SOLN COMPARISON:  None Available. FINDINGS: Brain: Numerous enhancing lesions are present in the brain compatible with widespread metastatic disease. Largest lesion in the left frontal lobe shows mixed cystic and solid components and extends to the dural surface. This lesion measures 3.6 x 4.3 cm with a moderate foraminal adjacent white matter edema. This lesion also shows internal hemorrhage. 6 mm midline shift to the right due to edema. 2 mm enhancing lesion right cerebellar tonsil axial image 29 5 mm enhancing lesion right inferior cerebellum axial image 33 3 mm enhancing lesion right superior cerebellum axial image 51 5 mm lesion cerebellar vermis axial image  49 3 mm lesion left inferolateral cerebellum axial image 34 6 mm enhancing lesion left cerebellum axial image 42 10 mm ring-enhancing lesion anterior pons to the right of midline axial image 54 3 mm lesion right medial temporal lobe axial image 52 4 mm lesion right medial temporal lobe axial image 54 5 mm lesion right medial temporal lobe axial image 61 5 mm lesion right mid temporal lobe axial image 65 4 mm lesion right frontal lobe axial image 80 3 mm lesion right medial frontal lobe axial image 80 5 mm lesion right superior posterior temporal lobe axial image 86 6 mm lesion right parietal lobe axial image 93 24 x 15 mm necrotic rim enhancing lesion right occipital lobe axial image 93 7 mm lesion right parietal lobe axial image 101 5 mm lesion right parietal lobe axial image 105 4 mm lesion right frontal lobe axial image 109 3 mm lesion right frontal lobe axial image 120 4 mm lesion right medial frontal parietal lobe axial image 121 3 mm lesion right frontal lobe axial image 134 3 mm lesion right anterior frontal lobe axial image 133 3 mm lesion right frontal convexity axial image 144 5 mm lesion left temporal lobe axial image 51 6 mm lesion left lateral temporal lobe axial image 62 4 mm lesion left lateral basal ganglia axial image 74 6 mm lesion left medial basal ganglia axial image 78 3 mm lesion left internal capsule axial image 85 3 mm lesion left posterior basal ganglia axial image 83 6 mm lesion left medial basal ganglia axial image 93 6 mm lesion left lateral temporal lobe axial image 118 3 mm lesion left medial frontal lobe axial image 133 Mild atrophy. Negative for acute infarct. Negative for hydrocephalus Vascular: Normal arterial flow voids. Skull and upper cervical spine:  Negative Sinuses/Orbits: Paranasal sinuses clear.  Right cataract extraction Other: None IMPRESSION: Extensive intracranial metastatic disease. Largest lesion left frontal lobe has significant edema, internal hemorrhage, and is  causing mass-effect with 6 mm midline shift to the right. Approximately 30 for metastatic deposit identified in the brain. Electronically Signed   By: Franchot Gallo M.D.   On: 07/02/2021 13:45     Assessment and plan- Patient is a 66 y.o. male with stage IVb extensive stage small cell lung cancer cT2 N0 M1 with brain and adrenal gland pancreatic and omental metastases  Discussed results of MRI brain with the patient in detail which showed at least 30 different fossae of metastatic deposits causing vasogenic edema and 6 mm midline shift.  Patient is currently undergoing whole brain radiation for the same which she will complete on 07/31/2021.  Again discussed with the patient the results of PET scan as well which confirms that there is extensive stage small cell lung cancer.  I recommend combination chemoimmunotherapy at this time.  Carboplatin AUC 5 IV along with etoposide 100 mg per metered square on day 1 day 2 and day 3 along with Tecentriq on day 1.  This will be given IV every 3 weeks for 4 cycles.  Plan to repeat scans after 2 cycles and 4 cycles.  Chemotherapy to start after radiation is completed.  Discussed risks and benefits of chemotherapy including all but not limited to nausea, vomiting, low blood counts, risk of infections and hospitalizations.  Risk of autoimmune side effects associated with immunotherapy including all but not limited to autoimmune colitis thyroiditis and the need to monitor kidney liver functions and other endocrinopathies.  Treatment will be given with a palliative intent.  Patient understands and agrees to proceed as planned.  I will consider the Udenyca as well with each chemo cycle.  If patient has stable disease after 4 cycles of chemotherapy he will go on to receive maintenance Tecentriq until progression or toxicity.  Patient will start steroid taper and will be done around the time that his radiation is completed.  We will plan for port placement and chemo teach  over the next couple of weeks.  Neoplasm related pain: We will increase oxycodone dose from 5 mg to 10 mg every 4-6 hours as needed.   Visit Diagnosis 1. Small cell lung cancer ([place])   2. Goals of care, counseling/discussion      Dr. Randa Evens, MD, MPH [facility] at [facility] [phone] 07/21/2021 1:05 PM

## 2021-07-21 NOTE — Progress Notes (Signed)
Met with patient and his wife during follow up visit with Dr. Janese Banks to discuss recent biopsy results and treatment options. All questions answered during visit. Reviewed upcoming appts. Informed that will be called with his appt for port placement. Instructed to call with any questions or needs. Pt and his wife verbalized understanding.

## 2021-07-21 NOTE — Progress Notes (Signed)
START ON PATHWAY REGIMEN - Small Cell Lung     Cycles 1 through 4, every 21 days:     Atezolizumab      Carboplatin      Etoposide    Cycles 5 and beyond, every 21 days:     Atezolizumab   **Always confirm dose/schedule in your pharmacy ordering system**  Patient Characteristics: Newly Diagnosed, Preoperative or Nonsurgical Candidate (Clinical Staging), First Line, Extensive Stage Therapeutic Status: Newly Diagnosed, Preoperative or Nonsurgical Candidate (Clinical Staging) AJCC T Category: cT2 AJCC N Category: cN0 AJCC M Category: pM1c AJCC 8 Stage Grouping: IVB Stage Classification: Extensive  Intent of Therapy: Non-Curative / Palliative Intent, Discussed with Patient

## 2021-07-22 ENCOUNTER — Inpatient Hospital Stay: Payer: Medicare HMO

## 2021-07-22 ENCOUNTER — Other Ambulatory Visit: Payer: Self-pay

## 2021-07-22 ENCOUNTER — Ambulatory Visit: Payer: Medicare HMO

## 2021-07-22 DIAGNOSIS — C7972 Secondary malignant neoplasm of left adrenal gland: Secondary | ICD-10-CM | POA: Diagnosis not present

## 2021-07-22 DIAGNOSIS — C786 Secondary malignant neoplasm of retroperitoneum and peritoneum: Secondary | ICD-10-CM | POA: Diagnosis not present

## 2021-07-22 DIAGNOSIS — C7971 Secondary malignant neoplasm of right adrenal gland: Secondary | ICD-10-CM | POA: Diagnosis not present

## 2021-07-22 DIAGNOSIS — Z5112 Encounter for antineoplastic immunotherapy: Secondary | ICD-10-CM | POA: Diagnosis not present

## 2021-07-22 DIAGNOSIS — C3431 Malignant neoplasm of lower lobe, right bronchus or lung: Secondary | ICD-10-CM | POA: Diagnosis not present

## 2021-07-22 DIAGNOSIS — C7889 Secondary malignant neoplasm of other digestive organs: Secondary | ICD-10-CM | POA: Diagnosis not present

## 2021-07-22 DIAGNOSIS — Z5111 Encounter for antineoplastic chemotherapy: Secondary | ICD-10-CM | POA: Diagnosis not present

## 2021-07-22 DIAGNOSIS — Z510 Encounter for antineoplastic radiation therapy: Secondary | ICD-10-CM | POA: Diagnosis not present

## 2021-07-22 DIAGNOSIS — C7931 Secondary malignant neoplasm of brain: Secondary | ICD-10-CM | POA: Diagnosis not present

## 2021-07-22 LAB — RAD ONC ARIA SESSION SUMMARY
Course Elapsed Days: 14
Plan Fractions Treated to Date: 9
Plan Prescribed Dose Per Fraction: 3 Gy
Plan Total Fractions Prescribed: 10
Plan Total Prescribed Dose: 30 Gy
Reference Point Dosage Given to Date: 27 Gy
Reference Point Session Dosage Given: 3 Gy
Session Number: 9

## 2021-07-22 NOTE — Progress Notes (Addendum)
Nutrition Assessment   Reason for Assessment:  Weight loss and MOC   ASSESSMENT:  66 year old male with small cell lung cancer with brain, omentum, pancreatic and adrenal metastases.  Patient currently receiving whole brain radiation due to complete on 7/21.  Patient to start carboplatin, etoposide and tecentriq after radiation.   Met with patient and wife prior to radiation today.  Patient reports that appetite is too good. Patient reports poor appetite about 1 month ago.  Started on steroids and appetite has increased but will begin taper.  Ate bacon, egg and grits this morning for breakfast.  Ate a few Little Debbie snack cakes this afternoon.  No lunch today.  Last night ate hamburger (100%) and a few onion rings. Drinks ensure at times and likes them. Denies any nutrition impact symptoms at this time    Medications: decadron, ativan, zofran, protonix, compazine   Labs: reviewed   Anthropometrics:   Height: 68 inches Weight: 212 lb 9.6 oz UBW: 224-227 lb  04/30/2021 222 lb 12.8 oz BMI: 32  4% weight loss in the last 3 months, concerning   NUTRITION DIAGNOSIS: Inadequate oral intake related to cancer as evidenced by 4% weight loss in the last 3 months and initially poor appetite, improving on steroids   INTERVENTION:  Discussed importance of good nutrition during treatment and weight maintenance Encouraged patient to include good sources of protein in diet and plant foods.  Patient is requesting wife be the primary contact to receive calls about appointments, text messages.  Message sent to Nurse Navigator.   Contact information provided   MONITORING, EVALUATION, GOAL: weight trends, intake after starting treatment and tapering off steroids   Next Visit: Wednesday, July 26 during infusion  Jenavi Beedle B. [name], [address], [place] Registered Dietitian [phone]

## 2021-07-23 ENCOUNTER — Ambulatory Visit: Payer: Medicare HMO

## 2021-07-23 ENCOUNTER — Other Ambulatory Visit: Payer: Self-pay

## 2021-07-23 ENCOUNTER — Inpatient Hospital Stay: Payer: Medicare HMO

## 2021-07-23 ENCOUNTER — Ambulatory Visit: Admission: RE | Admit: 2021-07-23 | Payer: Medicare HMO | Source: Ambulatory Visit

## 2021-07-23 DIAGNOSIS — Z5111 Encounter for antineoplastic chemotherapy: Secondary | ICD-10-CM | POA: Diagnosis not present

## 2021-07-23 DIAGNOSIS — C7931 Secondary malignant neoplasm of brain: Secondary | ICD-10-CM | POA: Diagnosis not present

## 2021-07-23 DIAGNOSIS — C7971 Secondary malignant neoplasm of right adrenal gland: Secondary | ICD-10-CM | POA: Diagnosis not present

## 2021-07-23 DIAGNOSIS — C786 Secondary malignant neoplasm of retroperitoneum and peritoneum: Secondary | ICD-10-CM | POA: Diagnosis not present

## 2021-07-23 DIAGNOSIS — Z5112 Encounter for antineoplastic immunotherapy: Secondary | ICD-10-CM | POA: Diagnosis not present

## 2021-07-23 DIAGNOSIS — C7889 Secondary malignant neoplasm of other digestive organs: Secondary | ICD-10-CM | POA: Diagnosis not present

## 2021-07-23 DIAGNOSIS — Z510 Encounter for antineoplastic radiation therapy: Secondary | ICD-10-CM | POA: Diagnosis not present

## 2021-07-23 DIAGNOSIS — C7972 Secondary malignant neoplasm of left adrenal gland: Secondary | ICD-10-CM | POA: Diagnosis not present

## 2021-07-23 DIAGNOSIS — C3431 Malignant neoplasm of lower lobe, right bronchus or lung: Secondary | ICD-10-CM | POA: Diagnosis not present

## 2021-07-23 LAB — RAD ONC ARIA SESSION SUMMARY
Course Elapsed Days: 15
Plan Fractions Treated to Date: 10
Plan Prescribed Dose Per Fraction: 3 Gy
Plan Total Fractions Prescribed: 10
Plan Total Prescribed Dose: 30 Gy
Reference Point Dosage Given to Date: 30 Gy
Reference Point Session Dosage Given: 3 Gy
Session Number: 10

## 2021-07-23 NOTE — Progress Notes (Addendum)
Tumor Board Documentation  DAMARKO STITELY was presented by Dr Janese Banks at our Tumor Board on 07/23/2021, which included representatives from medical oncology, radiology, pathology, radiation oncology, surgical, pulmonology, research, navigation, internal medicine, palliative care.  Zakee currently presents as a new patient, for [place], for new positive pathology with history of the following treatments: active survellience (biopsy).  Additionally, we reviewed previous medical and familial history, history of present illness, and recent lab results along with all available histopathologic and imaging studies. The tumor board considered available treatment options and made the following recommendations: WBRT, Chemoimunotherapy     The following procedures/referrals were also placed: No orders of the defined types were placed in this encounter.   Clinical Trial Status: not discussed   Staging used: Clinical Stage, Pathologic Stage AJCC Staging: T: c2   M: p1c Group: Stage IV B Metastatic Small Cell Lung Cancer   National site-specific guidelines NCCN were discussed with respect to the case.  Tumor board is a meeting of clinicians from various specialty areas who evaluate and discuss patients for whom a multidisciplinary approach is being considered. Final determinations in the plan of care are those of the provider(s). The responsibility for follow up of recommendations given during tumor board is that of the provider.   Today's extended care, comprehensive team conference, Darshawn was not present for the discussion and was not examined.   Multidisciplinary Tumor Board is a multidisciplinary case peer review process.  Decisions discussed in the Multidisciplinary Tumor Board reflect the opinions of the specialists present at the conference without having examined the patient.  Ultimately, treatment and diagnostic decisions rest with the primary provider(s) and the patient.

## 2021-07-23 NOTE — Progress Notes (Signed)
Spoke with patient's wife, Maurice Simpson, 07/23/21 @ 11:40 am. Martin Majestic over pre-procedure instructions to include the need to arrive at 9:30 for 10:30 procedure, need to be NPO after midnight on night prior to procedure and need for driver post-procedure. Patient's wife verbalized understanding.

## 2021-07-24 ENCOUNTER — Telehealth: Payer: Self-pay | Admitting: *Deleted

## 2021-07-24 ENCOUNTER — Other Ambulatory Visit: Payer: Self-pay

## 2021-07-24 ENCOUNTER — Other Ambulatory Visit: Payer: Self-pay | Admitting: Oncology

## 2021-07-24 ENCOUNTER — Ambulatory Visit
Admission: RE | Admit: 2021-07-24 | Discharge: 2021-07-24 | Disposition: A | Discharge status: Home / Self Care | Payer: Medicare HMO | Source: Ambulatory Visit | Attending: Radiation Oncology | Admitting: Radiation Oncology

## 2021-07-24 ENCOUNTER — Other Ambulatory Visit (HOSPITAL_COMMUNITY): Payer: Self-pay | Admitting: Radiology

## 2021-07-24 ENCOUNTER — Ambulatory Visit: Payer: Medicare HMO

## 2021-07-24 DIAGNOSIS — C7971 Secondary malignant neoplasm of right adrenal gland: Secondary | ICD-10-CM | POA: Diagnosis not present

## 2021-07-24 DIAGNOSIS — C3431 Malignant neoplasm of lower lobe, right bronchus or lung: Secondary | ICD-10-CM | POA: Diagnosis not present

## 2021-07-24 DIAGNOSIS — C3490 Malignant neoplasm of unspecified part of unspecified bronchus or lung: Secondary | ICD-10-CM | POA: Diagnosis not present

## 2021-07-24 DIAGNOSIS — Z452 Encounter for adjustment and management of vascular access device: Secondary | ICD-10-CM | POA: Diagnosis not present

## 2021-07-24 DIAGNOSIS — C7931 Secondary malignant neoplasm of brain: Secondary | ICD-10-CM | POA: Diagnosis not present

## 2021-07-24 DIAGNOSIS — C7889 Secondary malignant neoplasm of other digestive organs: Secondary | ICD-10-CM | POA: Diagnosis not present

## 2021-07-24 DIAGNOSIS — J449 Chronic obstructive pulmonary disease, unspecified: Secondary | ICD-10-CM | POA: Diagnosis not present

## 2021-07-24 DIAGNOSIS — C3491 Malignant neoplasm of unspecified part of right bronchus or lung: Secondary | ICD-10-CM | POA: Diagnosis not present

## 2021-07-24 DIAGNOSIS — Z510 Encounter for antineoplastic radiation therapy: Secondary | ICD-10-CM | POA: Diagnosis not present

## 2021-07-24 DIAGNOSIS — Z5111 Encounter for antineoplastic chemotherapy: Secondary | ICD-10-CM | POA: Diagnosis not present

## 2021-07-24 DIAGNOSIS — C786 Secondary malignant neoplasm of retroperitoneum and peritoneum: Secondary | ICD-10-CM | POA: Diagnosis not present

## 2021-07-24 DIAGNOSIS — Z5112 Encounter for antineoplastic immunotherapy: Secondary | ICD-10-CM | POA: Diagnosis not present

## 2021-07-24 DIAGNOSIS — C7972 Secondary malignant neoplasm of left adrenal gland: Secondary | ICD-10-CM | POA: Diagnosis not present

## 2021-07-24 LAB — RAD ONC ARIA SESSION SUMMARY
Course Elapsed Days: 16
Plan Fractions Treated to Date: 1
Plan Prescribed Dose Per Fraction: 5 Gy
Plan Total Fractions Prescribed: 4
Plan Total Prescribed Dose: 20 Gy
Reference Point Dosage Given to Date: 35 Gy
Reference Point Session Dosage Given: 5 Gy
Session Number: 11

## 2021-07-24 NOTE — Telephone Encounter (Signed)
Decadron was talked about for patient.  Camitta radiation had spoke with the wife about tapering of the Decadron.  Wife also mentioned that he has a tremor on one hand.  She sent a message to Dr. Janese Banks and Dr. Janese Banks told her to call patient wife back and let her know that patient should stay on the Decadron 3 times a day and do not taper.  Per Maudie Mercury called the patient's wife and and she said that she will continue him to be on Decadron and 3 times a day

## 2021-07-24 NOTE — Telephone Encounter (Signed)
[name] called asking if Dr Baruch Gouty is going to radiate patient abdomen to cover his abdominal organs or not stating he will be completing treatment soon. Please advise

## 2021-07-27 ENCOUNTER — Telehealth: Payer: Self-pay

## 2021-07-27 ENCOUNTER — Other Ambulatory Visit: Payer: Self-pay

## 2021-07-27 ENCOUNTER — Ambulatory Visit: Payer: Self-pay | Admitting: *Deleted

## 2021-07-27 ENCOUNTER — Encounter: Payer: Self-pay | Admitting: Oncology

## 2021-07-27 ENCOUNTER — Ambulatory Visit: Payer: Medicare HMO

## 2021-07-27 ENCOUNTER — Encounter: Payer: Self-pay | Admitting: Radiology

## 2021-07-27 ENCOUNTER — Ambulatory Visit
Admission: RE | Admit: 2021-07-27 | Discharge: 2021-07-27 | Disposition: A | Discharge status: Home / Self Care | Payer: Medicare HMO | Source: Ambulatory Visit | Attending: Oncology | Admitting: Oncology

## 2021-07-27 ENCOUNTER — Other Ambulatory Visit: Payer: Medicare HMO

## 2021-07-27 DIAGNOSIS — J449 Chronic obstructive pulmonary disease, unspecified: Secondary | ICD-10-CM | POA: Diagnosis not present

## 2021-07-27 DIAGNOSIS — C7931 Secondary malignant neoplasm of brain: Secondary | ICD-10-CM | POA: Diagnosis not present

## 2021-07-27 DIAGNOSIS — C3490 Malignant neoplasm of unspecified part of unspecified bronchus or lung: Secondary | ICD-10-CM | POA: Diagnosis not present

## 2021-07-27 DIAGNOSIS — C7972 Secondary malignant neoplasm of left adrenal gland: Secondary | ICD-10-CM | POA: Diagnosis not present

## 2021-07-27 DIAGNOSIS — C786 Secondary malignant neoplasm of retroperitoneum and peritoneum: Secondary | ICD-10-CM | POA: Diagnosis not present

## 2021-07-27 DIAGNOSIS — Z5112 Encounter for antineoplastic immunotherapy: Secondary | ICD-10-CM | POA: Diagnosis not present

## 2021-07-27 DIAGNOSIS — Z452 Encounter for adjustment and management of vascular access device: Secondary | ICD-10-CM | POA: Diagnosis not present

## 2021-07-27 DIAGNOSIS — C7971 Secondary malignant neoplasm of right adrenal gland: Secondary | ICD-10-CM | POA: Diagnosis not present

## 2021-07-27 DIAGNOSIS — C3491 Malignant neoplasm of unspecified part of right bronchus or lung: Secondary | ICD-10-CM | POA: Insufficient documentation

## 2021-07-27 DIAGNOSIS — C7889 Secondary malignant neoplasm of other digestive organs: Secondary | ICD-10-CM | POA: Diagnosis not present

## 2021-07-27 DIAGNOSIS — C3431 Malignant neoplasm of lower lobe, right bronchus or lung: Secondary | ICD-10-CM | POA: Diagnosis not present

## 2021-07-27 DIAGNOSIS — Z5111 Encounter for antineoplastic chemotherapy: Secondary | ICD-10-CM | POA: Diagnosis not present

## 2021-07-27 DIAGNOSIS — Z510 Encounter for antineoplastic radiation therapy: Secondary | ICD-10-CM | POA: Diagnosis not present

## 2021-07-27 HISTORY — DX: Chronic obstructive pulmonary disease, unspecified: J44.9

## 2021-07-27 HISTORY — PX: IR IMAGING GUIDED PORT INSERTION: IMG5740

## 2021-07-27 HISTORY — DX: Dyspnea, unspecified: R06.00

## 2021-07-27 HISTORY — DX: Malignant (primary) neoplasm, unspecified: C80.1

## 2021-07-27 LAB — RAD ONC ARIA SESSION SUMMARY
Course Elapsed Days: 19
Plan Fractions Treated to Date: 2
Plan Prescribed Dose Per Fraction: 5 Gy
Plan Total Fractions Prescribed: 4
Plan Total Prescribed Dose: 20 Gy
Reference Point Dosage Given to Date: 40 Gy
Reference Point Session Dosage Given: 5 Gy
Session Number: 12

## 2021-07-27 MED ORDER — HEPARIN SOD (PORK) LOCK FLUSH 100 UNIT/ML IV SOLN
INTRAVENOUS | Status: AC
  Administered 2021-07-27: 500 [IU]
  Filled 2021-07-27: qty 5

## 2021-07-27 MED ORDER — FENTANYL CITRATE (PF) 100 MCG/2ML IJ SOLN
INTRAMUSCULAR | Status: AC | PRN
  Administered 2021-07-27 (×2): 50 ug via INTRAVENOUS

## 2021-07-27 MED ORDER — MIDAZOLAM HCL 2 MG/2ML IJ SOLN
INTRAMUSCULAR | Status: AC
  Filled 2021-07-27: qty 2

## 2021-07-27 MED ORDER — MIDAZOLAM HCL 2 MG/2ML IJ SOLN
INTRAMUSCULAR | Status: AC | PRN
  Administered 2021-07-27: 1 mg via INTRAVENOUS

## 2021-07-27 MED ORDER — LIDOCAINE HCL 1 % IJ SOLN
INTRAMUSCULAR | Status: AC
  Administered 2021-07-27: 12 mL
  Filled 2021-07-27: qty 20

## 2021-07-27 MED ORDER — SODIUM CHLORIDE 0.9 % IV SOLN: INTRAVENOUS | Status: DC

## 2021-07-27 MED ORDER — FENTANYL CITRATE (PF) 100 MCG/2ML IJ SOLN
INTRAMUSCULAR | Status: AC
  Filled 2021-07-27: qty 2

## 2021-07-27 MED ORDER — MIDAZOLAM HCL 5 MG/5ML IJ SOLN
INTRAMUSCULAR | Status: AC | PRN
  Administered 2021-07-27: 1 mg via INTRAVENOUS

## 2021-07-27 NOTE — Telephone Encounter (Signed)
Message from Sharene Skeans sent at 07/27/2021  2:29 PM EDT  Summary: stop smoking   Pt asked if Jolene can prescribe him something to help him stop smoking / please advise           Call History   Type Contact Phone/Fax User  07/27/2021 02:27 PM EDT Phone (Incoming) Riyaan, [place] (Self) [phone] Lemmie Evens) Alanda Slim

## 2021-07-27 NOTE — Telephone Encounter (Signed)
Attempted to return call.   Left voicemail to call back to discuss stopping smoking with a nurse.

## 2021-07-27 NOTE — H&P (Signed)
Chief Complaint: Patient was seen in consultation today for image-guided port-a-catheter placement  Referring Physician(s): Bruce C  Supervising Physician: Aletta Edouard  Patient Status: [facility] - Out-pt  History of Present Illness: INFANT Maurice Simpson is a 66 y.o. male with PMH of COPD and metastatic small cell lung cancer being seen today for image-guided port-a-catheter placement. Pt underwent routine lung screening on 05/29/21 which revealed a right lower lobe lung mass with concern for bilateral adrenal masses. PET scan on 06/15/21 showed a RLL mass, bilateral hypermetabolic adrenal masses, a pancreatic body and tail lesion, a retroperitoneal nodule, a soft tissue mass inferior to transverse colon, and a right mesenteric soft tissue mass. Pt underwent CT-guided abdominal mass biopsy on 07/10/21 with Dr Denna Haggard, which revealed a high-grade carcinoma compatible with metastatic small cell carcinoma of lung origin. Pt has been referred to IR for port placement for chemotherapy.  Past Medical History:  Diagnosis Date   Cancer [facility])    COPD (chronic obstructive pulmonary disease) ([place])    Dyspnea     Past Surgical History:  Procedure Laterality Date   CATARACT EXTRACTION Right 03/10/2021    Allergies: Bee venom  Medications: Prior to Admission medications   Medication Sig Start Date End Date Taking? Authorizing Provider  albuterol (VENTOLIN HFA) 108 (90 Base) MCG/ACT inhaler Inhale 2 puffs into the lungs every 6 (six) hours as needed for wheezing or shortness of breath. 04/30/21  Yes Johnson, Megan P, DO  dexamethasone (DECADRON) 4 MG tablet Take 1 tablet by mouth twice a day for 7 days, then take 1 tablet by mouth daily for 7 days, then take 0.5 tablet by mouth daily for 7 days, then stop 07/20/21  Yes Sindy Guadeloupe, MD  Glycopyrrolate-Formoterol (BEVESPI AEROSPHERE) 9-4.8 MCG/ACT AERO Inhale 2 puffs into the lungs daily. 05/12/21  Yes [name], Jolene T, NP  loratadine (CLARITIN) 10 MG  tablet Take 1 tablet (10 mg total) by mouth daily. 06/24/21  Yes [name], Jolene T, NP  LORazepam (ATIVAN) 0.5 MG tablet Take 1 tablet (0.5 mg total) by mouth every 6 (six) hours as needed (Nausea or vomiting). 07/21/21  Yes Sindy Guadeloupe, MD  ondansetron (ZOFRAN) 4 MG tablet Take 1 tablet (4 mg total) by mouth every 8 (eight) hours as needed for nausea or vomiting. 06/24/21  Yes [name], Jolene T, NP  oxyCODONE (OXY IR/ROXICODONE) 5 MG immediate release tablet Take 1 tablet (5 mg total) by mouth every 4 (four) hours as needed for severe pain. 07/01/21  Yes Sindy Guadeloupe, MD  pantoprazole (PROTONIX) 20 MG tablet Take 1 tablet (20 mg total) by mouth daily. 07/21/21  Yes Sindy Guadeloupe, MD  prochlorperazine (COMPAZINE) 10 MG tablet Take 1 tablet (10 mg total) by mouth every 6 (six) hours as needed for nausea or vomiting. 07/01/21  Yes Sindy Guadeloupe, MD  traZODone (DESYREL) 50 MG tablet Take 1 tablet (50 mg total) by mouth at bedtime as needed for sleep. 07/21/21  Yes Sindy Guadeloupe, MD  EPINEPHrine 0.3 mg/0.3 mL IJ SOAJ injection Inject 0.3 mg into the muscle as needed for anaphylaxis. Patient not taking: Reported on 07/01/2021 05/12/21   [name], NP  lidocaine-prilocaine (EMLA) cream Apply to affected area once 07/21/21   Sindy Guadeloupe, MD  rosuvastatin (CRESTOR) 10 MG tablet Take 1 tablet (10 mg total) by mouth daily. 05/12/21   [name], NP     Family History  Problem Relation Age of Onset   Diabetes Mother  Hypertension Mother    Diabetes Father    Hypertension Father    Diabetes Sister    Cancer - Colon Cousin     Social History   Socioeconomic History   Marital status: Married    Spouse name: Not on file   Number of children: Not on file   Years of education: Not on file   Highest education level: Not on file  Occupational History   Not on file  Tobacco Use   Smoking status: Every Day    Packs/day: 2.00    Years: 51.00    Total pack years: 102.00    Types:  Cigarettes   Smokeless tobacco: Never  Vaping Use   Vaping Use: Never used  Substance and Sexual Activity   Alcohol use: Not Currently   Drug use: Never   Sexual activity: Yes  Other Topics Concern   Not on file  Social History Narrative   Not on file   Social Determinants of Health   Financial Resource Strain: Low Risk  (07/13/2021)   Overall Financial Resource Strain (CARDIA)    Difficulty of Paying Living Expenses: Not hard at all  Food Insecurity: No Food Insecurity (07/13/2021)   Hunger Vital Sign    Worried About Running Out of Food in the Last Year: Never true    Ran Out of Food in the Last Year: Never true  Transportation Needs: No Transportation Needs (07/13/2021)   PRAPARE - Hydrologist (Medical): No    Lack of Transportation (Non-Medical): No  Physical Activity: Inactive (07/20/2019)   Exercise Vital Sign    Days of Exercise per Week: 0 days    Minutes of Exercise per Session: 0 min  Stress: Stress Concern Present (07/13/2021)   [place]    Feeling of Stress : To some extent  Social Connections: Moderately Isolated (07/13/2021)   Social Connection and Isolation Panel [NHANES]    Frequency of Communication with Friends and Family: More than three times a week    Frequency of Social Gatherings with Friends and Family: Twice a week    Attends Religious Services: Never    Marine scientist or Organizations: No    Attends Music therapist: Never    Marital Status: Married     Review of Systems: A 12 point ROS discussed and pertinent positives are indicated in the HPI above.  All other systems are negative.  Review of Systems  Constitutional:  Negative for chills and fever.  Respiratory:  Positive for chest tightness and shortness of breath.        Pt reports SOB and chest tightness is always present d/t his COPD  Cardiovascular:  Negative for chest pain and  leg swelling.  Gastrointestinal:  Positive for abdominal pain. Negative for diarrhea, nausea and vomiting.       Pt reports persistent mild abdominal pain   Neurological:  Positive for dizziness. Negative for headaches.       Pt reports he has dizziness when he moves around, but not when still  Psychiatric/Behavioral:  Negative for confusion.     Vital Signs: BP 129/87   Temp 97.6 F (36.4 C) (Oral)   Resp 20   Ht 5\' 8"  (1.727 m)   Wt 212 lb (96.2 kg)   SpO2 92%   BMI 32.23 kg/m      Physical Exam Constitutional:      General: He is not in  acute distress.    Appearance: He is obese.  HENT:     Head: Normocephalic.     Mouth/Throat:     Mouth: Mucous membranes are moist.  Cardiovascular:     Rate and Rhythm: Normal rate and regular rhythm.     Pulses: Normal pulses.     Heart sounds: Normal heart sounds.  Pulmonary:     Effort: Pulmonary effort is normal. No respiratory distress.     Breath sounds: Wheezing present.  Abdominal:     General: Bowel sounds are normal.     Palpations: Abdomen is soft.     Tenderness: There is no abdominal tenderness.  Musculoskeletal:     Right lower leg: No edema.     Left lower leg: No edema.  Skin:    General: Skin is warm and dry.  Neurological:     Mental Status: He is alert and oriented to person, place, and time.  Psychiatric:        Mood and Affect: Mood normal.        Behavior: Behavior normal.     Imaging: CT ABDOMINAL MASS BIOPSY  Result Date: 07/10/2021 INDICATION: Abdominal mass EXAM: CT-guided core needle biopsy of abdominal mass MEDICATIONS: None. ANESTHESIA/SEDATION: Moderate (conscious) sedation was employed during this procedure. A total of Versed 1 mg and Fentanyl 50 mcg was administered intravenously. Moderate Sedation Time: 20 minutes. The patient's level of consciousness and vital signs were monitored continuously by radiology nursing throughout the procedure under my direct supervision. FLUOROSCOPY TIME:  N/a  COMPLICATIONS: None immediate. PROCEDURE: Informed written consent was obtained from the patient after a thorough discussion of the procedural risks, benefits and alternatives. All questions were addressed. Maximal Sterile Barrier Technique was utilized including caps, mask, sterile gowns, sterile gloves, sterile drape, hand hygiene and skin antiseptic. A timeout was performed prior to the initiation of the procedure. The patient was placed supine on the exam table. Limited CT of the abdomen and pelvis was performed for planning purposes, which again demonstrated a large central anterior abdominopelvic mass. Skin entry site was marked, and the overlying skin was prepped and draped in the standard sterile fashion. Local analgesia was obtained with 1% lidocaine. Using intermittent CT fluoroscopy, a 17 gauge introducer needle was advanced towards the mass. Prior to puncture of the peritoneal cavity, a loop of small bowel was noted to shift anterior to the mass. Skin entry site was replanned superior to this loop of bowel, and local analgesia was readministered. Again using CT fluoroscopy, a 17 gauge introducer needle was advanced into the abdominopelvic mass. Location was confirmed with CT. Subsequently, core needle biopsy was performed using an 18 gauge core biopsy device x6 total passes. Specimens were submitted in saline to pathology for further handling. Limited postprocedure imaging demonstrated no complicating features. The patient tolerated the procedure well without immediate complication. IMPRESSION: Successful CT-guided core needle biopsy of central abdominopelvic mass. Electronically Signed   By: Albin Felling M.D.   On: 07/10/2021 13:21   MR Brain W Wo Contrast  Result Date: 07/02/2021 CLINICAL DATA:  Lung mass.  Metastatic disease evaluation EXAM: MRI HEAD WITHOUT AND WITH CONTRAST TECHNIQUE: Multiplanar, multiecho pulse sequences of the brain and surrounding structures were obtained without and with  intravenous contrast. CONTRAST:  51mL GADAVIST GADOBUTROL 1 MMOL/ML IV SOLN COMPARISON:  None Available. FINDINGS: Brain: Numerous enhancing lesions are present in the brain compatible with widespread metastatic disease. Largest lesion in the left frontal lobe shows mixed cystic and solid components  and extends to the dural surface. This lesion measures 3.6 x 4.3 cm with a moderate foraminal adjacent white matter edema. This lesion also shows internal hemorrhage. 6 mm midline shift to the right due to edema. 2 mm enhancing lesion right cerebellar tonsil axial image 29 5 mm enhancing lesion right inferior cerebellum axial image 33 3 mm enhancing lesion right superior cerebellum axial image 51 5 mm lesion cerebellar vermis axial image 49 3 mm lesion left inferolateral cerebellum axial image 34 6 mm enhancing lesion left cerebellum axial image 42 10 mm ring-enhancing lesion anterior pons to the right of midline axial image 54 3 mm lesion right medial temporal lobe axial image 52 4 mm lesion right medial temporal lobe axial image 54 5 mm lesion right medial temporal lobe axial image 61 5 mm lesion right mid temporal lobe axial image 65 4 mm lesion right frontal lobe axial image 80 3 mm lesion right medial frontal lobe axial image 80 5 mm lesion right superior posterior temporal lobe axial image 86 6 mm lesion right parietal lobe axial image 93 24 x 15 mm necrotic rim enhancing lesion right occipital lobe axial image 93 7 mm lesion right parietal lobe axial image 101 5 mm lesion right parietal lobe axial image 105 4 mm lesion right frontal lobe axial image 109 3 mm lesion right frontal lobe axial image 120 4 mm lesion right medial frontal parietal lobe axial image 121 3 mm lesion right frontal lobe axial image 134 3 mm lesion right anterior frontal lobe axial image 133 3 mm lesion right frontal convexity axial image 144 5 mm lesion left temporal lobe axial image 51 6 mm lesion left lateral temporal lobe axial image 62 4  mm lesion left lateral basal ganglia axial image 74 6 mm lesion left medial basal ganglia axial image 78 3 mm lesion left internal capsule axial image 85 3 mm lesion left posterior basal ganglia axial image 83 6 mm lesion left medial basal ganglia axial image 93 6 mm lesion left lateral temporal lobe axial image 118 3 mm lesion left medial frontal lobe axial image 133 Mild atrophy. Negative for acute infarct. Negative for hydrocephalus Vascular: Normal arterial flow voids. Skull and upper cervical spine: Negative Sinuses/Orbits: Paranasal sinuses clear.  Right cataract extraction Other: None IMPRESSION: Extensive intracranial metastatic disease. Largest lesion left frontal lobe has significant edema, internal hemorrhage, and is causing mass-effect with 6 mm midline shift to the right. Approximately 30 for metastatic deposit identified in the brain. Electronically Signed   By: Franchot Gallo M.D.   On: 07/02/2021 13:45    Labs:  CBC: Recent Labs    04/30/21 1136 07/01/21 1157 07/10/21 1100  WBC 8.7 9.1 18.4*  HGB 18.0* 17.5* 16.7  HCT 53.3* 50.1 46.5  PLT 156 229 215    COAGS: Recent Labs    07/10/21 1100  INR 1.0    BMP: Recent Labs    04/30/21 1136 07/01/21 1157  NA 136 135  K 4.2 3.8  CL 101 100  CO2 20 26  GLUCOSE 94 108*  BUN 12 10  CALCIUM 9.4 9.0  CREATININE 0.85 1.01  GFRNONAA  --  >60    LIVER FUNCTION TESTS: Recent Labs    04/30/21 1136 07/01/21 1157  BILITOT 0.8 1.0  AST 29 32  ALT 21 17  ALKPHOS 59 50  PROT 7.2 7.5  ALBUMIN 4.3 3.8    TUMOR MARKERS: No results for input(s): "AFPTM", "CEA", "CA199", "CHROMGRNA" in the  last 8760 hours.  Assessment and Plan: Kinan Safley is a 66yo male with PMH of COPD and metastatic small cell lung cancer who is known to IR from CT-guided abdominal mass biopsy on 07/10/21. He is being seen today for image-guided port-a-catheter placement for chemotherapy. Imaging has been reviewed and patient has been approved for  procedure on 07/27/21 with Dr Kathlene Cote.   Risks and benefits of image guided port-a-catheter placement was discussed with the patient including, but not limited to bleeding, infection, pneumothorax, or fibrin sheath development and need for additional procedures.  All of the patient's questions were answered, patient is agreeable to proceed. Consent signed and in chart.    Thank you for this interesting consult.  I greatly enjoyed meeting ALEXIO SROKA and look forward to participating in their care.  A copy of this report was sent to the requesting provider on this date.  Electronically Signed: Lura Em, PA-C 07/27/2021, 10:16 AM   I spent a total of    15 Minutes in face to face in clinical consultation, greater than 50% of which was counseling/coordinating care for image-guided port-a-catheter placement

## 2021-07-27 NOTE — Telephone Encounter (Signed)
Received message from Ranelle Oyster from radiation stating wife cannot come to chemo class in the morning and wants it to be done over phone.   Patient is getting chemotherapy and port a cath placement so in-person class is required.  Telephone call made but no answer but left detailed message stating I can make special arrangements to do a chemo education class on Wednesday at 1:00 just prior to patient receiving radiation.   Appointment moved by scheduler to [facility] 7/19 at 1:00 pm.

## 2021-07-27 NOTE — Procedures (Signed)
Interventional Radiology Procedure Note  Procedure: Single Lumen Power Port Placement    Access:  Right IJ vein.  Findings: Catheter tip positioned at SVC/RA junction. Port is ready for immediate use.   Complications: None  EBL: < 10 mL  Recommendations:  - Ok to shower in 24 hours - Do not submerge for 7 days - Routine line care   Kloi Brodman T. Elyssa Pendelton, M.D Pager:  [phone]   

## 2021-07-28 ENCOUNTER — Ambulatory Visit: Payer: Medicare HMO

## 2021-07-28 ENCOUNTER — Other Ambulatory Visit: Payer: Self-pay

## 2021-07-28 ENCOUNTER — Telehealth: Payer: Self-pay | Admitting: *Deleted

## 2021-07-28 ENCOUNTER — Inpatient Hospital Stay: Payer: Medicare HMO

## 2021-07-28 DIAGNOSIS — C7971 Secondary malignant neoplasm of right adrenal gland: Secondary | ICD-10-CM | POA: Diagnosis not present

## 2021-07-28 DIAGNOSIS — C7931 Secondary malignant neoplasm of brain: Secondary | ICD-10-CM | POA: Diagnosis not present

## 2021-07-28 DIAGNOSIS — C7972 Secondary malignant neoplasm of left adrenal gland: Secondary | ICD-10-CM | POA: Diagnosis not present

## 2021-07-28 DIAGNOSIS — Z510 Encounter for antineoplastic radiation therapy: Secondary | ICD-10-CM | POA: Diagnosis not present

## 2021-07-28 DIAGNOSIS — C786 Secondary malignant neoplasm of retroperitoneum and peritoneum: Secondary | ICD-10-CM | POA: Diagnosis not present

## 2021-07-28 DIAGNOSIS — C3431 Malignant neoplasm of lower lobe, right bronchus or lung: Secondary | ICD-10-CM | POA: Diagnosis not present

## 2021-07-28 DIAGNOSIS — C7889 Secondary malignant neoplasm of other digestive organs: Secondary | ICD-10-CM | POA: Diagnosis not present

## 2021-07-28 DIAGNOSIS — Z5111 Encounter for antineoplastic chemotherapy: Secondary | ICD-10-CM | POA: Diagnosis not present

## 2021-07-28 DIAGNOSIS — Z5112 Encounter for antineoplastic immunotherapy: Secondary | ICD-10-CM | POA: Diagnosis not present

## 2021-07-28 LAB — RAD ONC ARIA SESSION SUMMARY
Course Elapsed Days: 20
Plan Fractions Treated to Date: 3
Plan Prescribed Dose Per Fraction: 5 Gy
Plan Total Fractions Prescribed: 4
Plan Total Prescribed Dose: 20 Gy
Reference Point Dosage Given to Date: 45 Gy
Reference Point Session Dosage Given: 5 Gy
Session Number: 13

## 2021-07-28 NOTE — Telephone Encounter (Signed)
  Chief Complaint: Medication Symptoms: NA Frequency: NA Pertinent Negatives: Patient denies NA Disposition: [] ED /[] Urgent Care (no appt availability in office) / [] Appointment(In office/virtual)/ []  [place] [facility]/ [] Home Care/ [] Refused Recommended Disposition /[] [place] [facility]/ [x]  Follow-up with PCP Additional Notes: Pt would like to start medication to stop smoking. Unable to triage pt, spoke to wife on DPR; Pt is at radiation appt. Pt last seen 06/24/21,unsure if needs appt. Wife states he has radiation then chemo next week, hard to manage appts. Please advise.  [facility] appropriate[facility] [place]. Reason for Disposition  Requesting prescription medication to help quit smoking  Answer Assessment - Initial Assessment Questions 1. MAIN CONCERN or SYMPTOMS: "What question or concern do you have?" "What symptoms are you having?" (e.g., none, cough, difficulty breathing, headache, dizziness, nausea)      Would like medication to stop smoking  Protocols used: Smoking - Tobacco Use and Problems-A-AH

## 2021-07-28 NOTE — Telephone Encounter (Signed)
Spoke with pt's wife as well since she left me a message regarding his steroid taper. Instructed pt's wife for him to continue taking his steroid taper until finished and that it should not interfere with his chemo scheduled for Mon 7/24. She verbalized understanding.

## 2021-07-28 NOTE — Telephone Encounter (Signed)
Maurice Simpson called asking if Dr Baruch Gouty has signed his [facility] letter yet, she is also asking about patient supposed to be tapering off his steroids Dr Baruch Gouty has him on before his chemotherapy Monday and if this can be done so he can get his chemotherapy treatment. Please advise

## 2021-07-29 ENCOUNTER — Inpatient Hospital Stay: Payer: Medicare HMO

## 2021-07-29 ENCOUNTER — Encounter: Payer: Self-pay | Admitting: Oncology

## 2021-07-29 ENCOUNTER — Other Ambulatory Visit: Payer: Self-pay

## 2021-07-29 ENCOUNTER — Ambulatory Visit: Payer: Medicare HMO

## 2021-07-29 DIAGNOSIS — C7971 Secondary malignant neoplasm of right adrenal gland: Secondary | ICD-10-CM | POA: Diagnosis not present

## 2021-07-29 DIAGNOSIS — C7889 Secondary malignant neoplasm of other digestive organs: Secondary | ICD-10-CM | POA: Diagnosis not present

## 2021-07-29 DIAGNOSIS — C3431 Malignant neoplasm of lower lobe, right bronchus or lung: Secondary | ICD-10-CM | POA: Diagnosis not present

## 2021-07-29 DIAGNOSIS — Z510 Encounter for antineoplastic radiation therapy: Secondary | ICD-10-CM | POA: Diagnosis not present

## 2021-07-29 DIAGNOSIS — Z5111 Encounter for antineoplastic chemotherapy: Secondary | ICD-10-CM | POA: Diagnosis not present

## 2021-07-29 DIAGNOSIS — Z5112 Encounter for antineoplastic immunotherapy: Secondary | ICD-10-CM | POA: Diagnosis not present

## 2021-07-29 DIAGNOSIS — C786 Secondary malignant neoplasm of retroperitoneum and peritoneum: Secondary | ICD-10-CM | POA: Diagnosis not present

## 2021-07-29 DIAGNOSIS — C7931 Secondary malignant neoplasm of brain: Secondary | ICD-10-CM | POA: Diagnosis not present

## 2021-07-29 DIAGNOSIS — C7972 Secondary malignant neoplasm of left adrenal gland: Secondary | ICD-10-CM | POA: Diagnosis not present

## 2021-07-29 LAB — RAD ONC ARIA SESSION SUMMARY
Course Elapsed Days: 21
Plan Fractions Treated to Date: 4
Plan Prescribed Dose Per Fraction: 5 Gy
Plan Total Fractions Prescribed: 4
Plan Total Prescribed Dose: 20 Gy
Reference Point Dosage Given to Date: 50 Gy
Reference Point Session Dosage Given: 5 Gy
Session Number: 14

## 2021-07-29 NOTE — Telephone Encounter (Signed)
Gave spouse DMV paperwork yesterday, Maudie Mercury will go over taper today at patient appointment.

## 2021-07-29 NOTE — Telephone Encounter (Signed)
Virtual appt scheduled for 08/06/21 @ 11:20am

## 2021-07-30 ENCOUNTER — Inpatient Hospital Stay: Payer: Medicare HMO

## 2021-07-30 ENCOUNTER — Ambulatory Visit: Payer: Medicare HMO

## 2021-07-31 ENCOUNTER — Ambulatory Visit: Payer: Medicare HMO

## 2021-07-31 MED FILL — Dexamethasone Sodium Phosphate Inj 100 MG/10ML: INTRAMUSCULAR | Qty: 1 | Status: AC

## 2021-07-31 MED FILL — Fosaprepitant Dimeglumine For IV Infusion 150 MG (Base Eq): INTRAVENOUS | Qty: 5 | Status: AC

## 2021-08-03 ENCOUNTER — Other Ambulatory Visit: Payer: Self-pay

## 2021-08-03 ENCOUNTER — Telehealth: Payer: Self-pay | Admitting: *Deleted

## 2021-08-03 ENCOUNTER — Other Ambulatory Visit: Payer: Self-pay | Admitting: *Deleted

## 2021-08-03 ENCOUNTER — Inpatient Hospital Stay: Payer: Medicare HMO

## 2021-08-03 ENCOUNTER — Inpatient Hospital Stay: Payer: Medicare HMO | Admitting: Oncology

## 2021-08-03 ENCOUNTER — Encounter: Payer: Self-pay | Admitting: Oncology

## 2021-08-03 VITALS — BP 136/69 | HR 79 | Temp 97.6°F | Resp 16 | Ht 68.0 in | Wt 211.8 lb

## 2021-08-03 DIAGNOSIS — C786 Secondary malignant neoplasm of retroperitoneum and peritoneum: Secondary | ICD-10-CM | POA: Diagnosis not present

## 2021-08-03 DIAGNOSIS — R7989 Other specified abnormal findings of blood chemistry: Secondary | ICD-10-CM | POA: Diagnosis not present

## 2021-08-03 DIAGNOSIS — C7889 Secondary malignant neoplasm of other digestive organs: Secondary | ICD-10-CM | POA: Diagnosis not present

## 2021-08-03 DIAGNOSIS — C7971 Secondary malignant neoplasm of right adrenal gland: Secondary | ICD-10-CM | POA: Diagnosis not present

## 2021-08-03 DIAGNOSIS — C3490 Malignant neoplasm of unspecified part of unspecified bronchus or lung: Secondary | ICD-10-CM

## 2021-08-03 DIAGNOSIS — R053 Chronic cough: Secondary | ICD-10-CM

## 2021-08-03 DIAGNOSIS — Z5112 Encounter for antineoplastic immunotherapy: Secondary | ICD-10-CM | POA: Diagnosis not present

## 2021-08-03 DIAGNOSIS — Z5111 Encounter for antineoplastic chemotherapy: Secondary | ICD-10-CM | POA: Diagnosis not present

## 2021-08-03 DIAGNOSIS — R0602 Shortness of breath: Secondary | ICD-10-CM

## 2021-08-03 DIAGNOSIS — C7972 Secondary malignant neoplasm of left adrenal gland: Secondary | ICD-10-CM | POA: Diagnosis not present

## 2021-08-03 DIAGNOSIS — C7931 Secondary malignant neoplasm of brain: Secondary | ICD-10-CM | POA: Diagnosis not present

## 2021-08-03 DIAGNOSIS — C3431 Malignant neoplasm of lower lobe, right bronchus or lung: Secondary | ICD-10-CM | POA: Diagnosis not present

## 2021-08-03 DIAGNOSIS — Z510 Encounter for antineoplastic radiation therapy: Secondary | ICD-10-CM | POA: Diagnosis not present

## 2021-08-03 LAB — COMPREHENSIVE METABOLIC PANEL
ALT: 100 U/L — ABNORMAL HIGH (ref 0–44)
AST: 57 U/L — ABNORMAL HIGH (ref 15–41)
Albumin: 3.4 g/dL — ABNORMAL LOW (ref 3.5–5.0)
Alkaline Phosphatase: 70 U/L (ref 38–126)
Anion gap: 9 (ref 5–15)
BUN: 21 mg/dL (ref 8–23)
CO2: 25 mmol/L (ref 22–32)
Calcium: 8.8 mg/dL — ABNORMAL LOW (ref 8.9–10.3)
Chloride: 98 mmol/L (ref 98–111)
Creatinine, Ser: 0.77 mg/dL (ref 0.61–1.24)
GFR, Estimated: >60 mL/min (ref >60)
Glucose, Bld: 221 mg/dL — ABNORMAL HIGH (ref 70–99)
Potassium: 3.8 mmol/L (ref 3.5–5.1)
Sodium: 132 mmol/L — ABNORMAL LOW (ref 135–145)
Total Bilirubin: 1 mg/dL (ref 0.3–1.2)
Total Protein: 7.2 g/dL (ref 6.5–8.1)

## 2021-08-03 LAB — CBC WITH DIFFERENTIAL/PLATELET
Abs Immature Granulocytes: 0.63 10*3/uL — ABNORMAL HIGH (ref 0.00–0.07)
Basophils Absolute: 0.1 10*3/uL (ref 0.0–0.1)
Basophils Relative: 1 %
Eosinophils Absolute: 0.1 10*3/uL (ref 0.0–0.5)
Eosinophils Relative: 1 %
HCT: 49.8 % (ref 39.0–52.0)
Hemoglobin: 17.5 g/dL — ABNORMAL HIGH (ref 13.0–17.0)
Immature Granulocytes: 4 %
Lymphocytes Relative: 12 %
Lymphs Abs: 1.8 10*3/uL (ref 0.7–4.0)
MCH: 32 pg (ref 26.0–34.0)
MCHC: 35.1 g/dL (ref 30.0–36.0)
MCV: 91 fL (ref 80.0–100.0)
Monocytes Absolute: 0.6 10*3/uL (ref 0.1–1.0)
Monocytes Relative: 4 %
Neutro Abs: 11.5 10*3/uL — ABNORMAL HIGH (ref 1.7–7.7)
Neutrophils Relative %: 78 %
Platelets: 148 10*3/uL — ABNORMAL LOW (ref 150–400)
RBC: 5.47 MIL/uL (ref 4.22–5.81)
RDW: 13.2 % (ref 11.5–15.5)
WBC: 14.6 10*3/uL — ABNORMAL HIGH (ref 4.0–10.5)
nRBC: 0 % (ref 0.0–0.2)

## 2021-08-03 LAB — TSH: TSH: 3.553 u[IU]/mL (ref 0.350–4.500)

## 2021-08-03 MED ORDER — IPRATROPIUM-ALBUTEROL 0.5-2.5 (3) MG/3ML IN SOLN: 3.0000 mL | RESPIRATORY_TRACT | 2 refills | Status: DC | PRN

## 2021-08-03 MED ORDER — BUDESONIDE 0.25 MG/2ML IN SUSP: 0.2500 mg | Freq: Two times a day (BID) | RESPIRATORY_TRACT | 12 refills | Status: DC | PRN

## 2021-08-03 MED ORDER — SODIUM CHLORIDE 0.9 % IV SOLN
1200.0000 mg | Freq: Once | INTRAVENOUS | Status: AC
  Administered 2021-08-03: 1200 mg via INTRAVENOUS
  Filled 2021-08-03: qty 20

## 2021-08-03 MED ORDER — SODIUM CHLORIDE 0.9 % IV SOLN
150.0000 mg | Freq: Once | INTRAVENOUS | Status: AC
  Administered 2021-08-03: 150 mg via INTRAVENOUS
  Filled 2021-08-03: qty 150

## 2021-08-03 MED ORDER — HEPARIN SOD (PORK) LOCK FLUSH 100 UNIT/ML IV SOLN
500.0000 [IU] | Freq: Once | INTRAVENOUS | Status: AC | PRN
  Administered 2021-08-03: 500 [IU]
  Filled 2021-08-03: qty 5

## 2021-08-03 MED ORDER — SODIUM CHLORIDE 0.9 % IV SOLN
10.0000 mg | Freq: Once | INTRAVENOUS | Status: AC
  Administered 2021-08-03: 10 mg via INTRAVENOUS
  Filled 2021-08-03: qty 10

## 2021-08-03 MED ORDER — ALBUTEROL SULFATE (2.5 MG/3ML) 0.083% IN NEBU: 2.5000 mg | INHALATION_SOLUTION | Freq: Four times a day (QID) | RESPIRATORY_TRACT | 12 refills | Status: DC | PRN

## 2021-08-03 MED ORDER — SODIUM CHLORIDE 0.9% FLUSH
10.0000 mL | Freq: Once | INTRAVENOUS | Status: AC
  Administered 2021-08-03: 10 mL via INTRAVENOUS
  Filled 2021-08-03: qty 10

## 2021-08-03 MED ORDER — PALONOSETRON HCL INJECTION 0.25 MG/5ML
0.2500 mg | Freq: Once | INTRAVENOUS | Status: AC
  Administered 2021-08-03: 0.25 mg via INTRAVENOUS
  Filled 2021-08-03: qty 5

## 2021-08-03 MED ORDER — SODIUM CHLORIDE 0.9 % IV SOLN
100.0000 mg/m2 | Freq: Once | INTRAVENOUS | Status: AC
  Administered 2021-08-03: 220 mg via INTRAVENOUS
  Filled 2021-08-03: qty 11

## 2021-08-03 MED ORDER — SODIUM CHLORIDE 0.9 % IV SOLN
Freq: Once | INTRAVENOUS | Status: AC
  Filled 2021-08-03: qty 250

## 2021-08-03 MED ORDER — SODIUM CHLORIDE 0.9 % IV SOLN
615.5000 mg | Freq: Once | INTRAVENOUS | Status: AC
  Administered 2021-08-03: 620 mg via INTRAVENOUS
  Filled 2021-08-03: qty 62

## 2021-08-03 NOTE — Progress Notes (Signed)
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  He had a lung cancer screening protocol on 05/29/2021 which showed a right lower lobe lung mass with concern for bilateral adrenal masses.  This was followed by a PET CT scan on 06/15/2021 which showed a right lower lobe soft tissue mass with an SUV uptake of 16.6.  No hypermetabolic hilar or mediastinal adenopathy.  Bilateral hypermetabolic adrenal masses 6.7 and 5.1 cm respectively.  2.8 cm soft tissue lesion in the body of the pancreas and 1.9 cm soft tissue lesion in the tail of the pancreas.  2.3 cm retroperitoneal soft tissue nodule.  10 cm x 6.4 cm soft tissue mass inferior to the transverse colon in the midline.  4.1 x 2.4 cm right mesenteric soft tissue mass with an SUV of 11.7.   MRI brain was significant for multiple areas of intracranial metastatic disease.  Largest lesion in the left frontal lobe with significant edema internal hemorrhage and is causing a mass effect with 6 mm midline shift.  30 metastatic foci seen in the brain.  Patient is undergoing whole brain radiation for the same.     Abdominal mass biopsy showed high-grade carcinoma compatible with metastatic small cell carcinoma of lung origin.    Interval history- reports ongoing fatigue. Has exertional SOB.  Just completed whole brain radiation treatment last week  ECOG PS- 2 Pain scale- 2 Opioid  associated constipation- no  Review of systems- Review of Systems  Constitutional:  Positive for malaise/fatigue. Negative for chills, fever and weight loss.  HENT:  Negative for congestion, ear discharge and nosebleeds.   Eyes:  Negative for blurred vision.  Respiratory:  Positive for shortness of breath. Negative for cough, hemoptysis, sputum production and wheezing.   Cardiovascular:  Negative for chest pain, palpitations, orthopnea and claudication.  Gastrointestinal:  Negative for abdominal pain, blood in stool, constipation, diarrhea, heartburn, melena, nausea and vomiting.  Genitourinary:  Negative for dysuria, flank pain, frequency, hematuria and urgency.  Musculoskeletal:  Negative for back pain, joint pain and myalgias.  Skin:  Negative for rash.  Neurological:  Negative for dizziness, tingling, focal weakness, seizures, weakness and headaches.  Endo/Heme/Allergies:  Does not bruise/bleed easily.  Psychiatric/Behavioral:  Negative for depression and suicidal ideas. The patient does not have insomnia.       Allergies  Allergen Reactions   Bee Venom      Past Medical History:  Diagnosis Date   Cancer ([place])    COPD (chronic obstructive pulmonary disease) ([place])    Dyspnea      Past Surgical History:  Procedure Laterality Date   CATARACT EXTRACTION Right 03/10/2021   IR IMAGING GUIDED PORT INSERTION  07/27/2021    Social History   Socioeconomic History   Marital status: Married    Spouse name: Not on file   Number of children: Not on file   Years of education: Not on file   Highest education level: Not on file  Occupational History  Not on file  Tobacco Use   Smoking status: Every Day    Packs/day: 2.00    Years: 51.00    Total pack years: 102.00    Types: Cigarettes   Smokeless tobacco: Never  Vaping Use   Vaping Use: Never used  Substance and Sexual Activity   Alcohol use: Not Currently   Drug use: Never   Sexual activity: Yes  Other Topics Concern    Not on file  Social History Narrative   Not on file   Social Determinants of Health   Financial Resource Strain: Low Risk  (07/13/2021)   Overall Financial Resource Strain (CARDIA)    Difficulty of Paying Living Expenses: Not hard at all  Food Insecurity: No Food Insecurity (07/13/2021)   Hunger Vital Sign    Worried About Running Out of Food in the Last Year: Never true    Ran Out of Food in the Last Year: Never true  Transportation Needs: No Transportation Needs (07/13/2021)   PRAPARE - Hydrologist (Medical): No    Lack of Transportation (Non-Medical): No  Physical Activity: Inactive (07/20/2019)   Exercise Vital Sign    Days of Exercise per Week: 0 days    Minutes of Exercise per Session: 0 min  Stress: Stress Concern Present (07/13/2021)   [place]    Feeling of Stress : To some extent  Social Connections: Moderately Isolated (07/13/2021)   Social Connection and Isolation Panel [NHANES]    Frequency of Communication with Friends and Family: More than three times a week    Frequency of Social Gatherings with Friends and Family: Twice a week    Attends Religious Services: Never    Marine scientist or Organizations: No    Attends Archivist Meetings: Never    Marital Status: Married  Human resources officer Violence: Not At Risk (07/13/2021)   Humiliation, Afraid, Rape, and Kick questionnaire    Fear of Current or Ex-Partner: No    Emotionally Abused: No    Physically Abused: No    Sexually Abused: No    Family History  Problem Relation Age of Onset   Diabetes Mother    Hypertension Mother    Diabetes Father    Hypertension Father    Diabetes Sister    Cancer - Colon Cousin      Current Outpatient Medications:    albuterol (VENTOLIN HFA) 108 (90 Base) MCG/ACT inhaler, Inhale 2 puffs into the lungs every 6 (six) hours as needed for wheezing or shortness of breath., Disp:  8 g, Rfl: 1   dexamethasone (DECADRON) 4 MG tablet, Take 1 tablet by mouth twice a day for 7 days, then take 1 tablet by mouth daily for 7 days, then take 0.5 tablet by mouth daily for 7 days, then stop, Disp: 30 tablet, Rfl: 0   EPINEPHrine 0.3 mg/0.3 mL IJ SOAJ injection, Inject 0.3 mg into the muscle as needed for anaphylaxis. (Patient not taking: Reported on 07/01/2021), Disp: 1 each, Rfl: 4   Glycopyrrolate-Formoterol (BEVESPI AEROSPHERE) 9-4.8 MCG/ACT AERO, Inhale 2 puffs into the lungs daily., Disp: 10.7 g, Rfl: 12   lidocaine-prilocaine (EMLA) cream, Apply to affected area once, Disp: 30 g, Rfl: 3   loratadine (CLARITIN) 10 MG tablet, Take 1 tablet (10 mg total) by mouth daily., Disp: 30 tablet, Rfl: 11   LORazepam (ATIVAN) 0.5 MG tablet, Take 1 tablet (0.5 mg total) by mouth every 6 (six)  hours as needed (Nausea or vomiting)., Disp: 30 tablet, Rfl: 0   ondansetron (ZOFRAN) 4 MG tablet, Take 1 tablet (4 mg total) by mouth every 8 (eight) hours as needed for nausea or vomiting., Disp: 60 tablet, Rfl: 3   oxyCODONE (OXY IR/ROXICODONE) 5 MG immediate release tablet, Take 1 tablet (5 mg total) by mouth every 4 (four) hours as needed for severe pain., Disp: 60 tablet, Rfl: 0   pantoprazole (PROTONIX) 20 MG tablet, Take 1 tablet (20 mg total) by mouth daily., Disp: 30 tablet, Rfl: 2   prochlorperazine (COMPAZINE) 10 MG tablet, Take 1 tablet (10 mg total) by mouth every 6 (six) hours as needed for nausea or vomiting., Disp: 60 tablet, Rfl: 2   rosuvastatin (CRESTOR) 10 MG tablet, Take 1 tablet (10 mg total) by mouth daily., Disp: 90 tablet, Rfl: 4   traZODone (DESYREL) 50 MG tablet, Take 1 tablet (50 mg total) by mouth at bedtime as needed for sleep., Disp: 30 tablet, Rfl: 2 No current facility-administered medications for this visit.  Facility-Administered Medications Ordered in Other Visits:    sodium chloride flush (NS) 0.9 % injection 10 mL, 10 mL, Intravenous, Once, Sindy [place],  MD  Physical exam:  Vitals:   08/03/21 0911  BP: 136/69  Pulse: 79  Resp: 16  Temp: 97.6 F (36.4 C)  TempSrc: Oral  Weight: 211 lb 12.8 oz (96.1 kg)  Height: 5\' 8"  (1.727 m)   Physical Exam Cardiovascular:     Rate and Rhythm: Normal rate and regular rhythm.     Heart sounds: Normal heart sounds.  Pulmonary:     Effort: Pulmonary effort is normal.     Comments: Scattered bilateral wheezing Abdominal:     General: Bowel sounds are normal.     Palpations: Abdomen is soft.  Skin:    General: Skin is warm and dry.  Neurological:     Mental Status: He is alert and oriented to person, place, and time.         Latest Ref Rng & Units 08/03/2021    8:21 AM  CMP  Glucose 70 - 99 mg/dL 221   BUN 8 - 23 mg/dL 21   Creatinine 0.61 - 1.24 mg/dL 0.77   Sodium 135 - 145 mmol/L 132   Potassium 3.5 - 5.1 mmol/L 3.8   Chloride 98 - 111 mmol/L 98   CO2 22 - 32 mmol/L 25   Calcium 8.9 - 10.3 mg/dL 8.8   Total Protein 6.5 - 8.1 g/dL 7.2   Total Bilirubin 0.3 - 1.2 mg/dL 1.0   Alkaline Phos 38 - 126 U/L 70   AST 15 - 41 U/L 57   ALT 0 - 44 U/L 100       Latest Ref Rng & Units 07/10/2021   11:00 AM  CBC  WBC 4.0 - 10.5 K/uL 18.4   Hemoglobin 13.0 - 17.0 g/dL 16.7   Hematocrit 39.0 - 52.0 % 46.5   Platelets 150 - 400 K/uL 215     No images are attached to the encounter.  IR IMAGING GUIDED PORT INSERTION  Result Date: 07/27/2021 CLINICAL DATA:  Metastatic small cell lung carcinoma and need for porta cath for chemotherapy. EXAM: IMPLANTED PORT A CATH PLACEMENT WITH ULTRASOUND AND FLUOROSCOPIC GUIDANCE ANESTHESIA/SEDATION: Moderate (conscious) sedation was employed during this procedure. A total of Versed 2.0 mg and Fentanyl 100 mcg was administered intravenously by radiology nursing. Moderate Sedation Time: 20 minutes. The patient's level of consciousness and vital signs  were monitored continuously by radiology nursing throughout the procedure under my direct supervision.  FLUOROSCOPY: 36 seconds.  11.4 mGy. PROCEDURE: The procedure, risks, benefits, and alternatives were explained to the patient. Questions regarding the procedure were encouraged and answered. The patient understands and consents to the procedure. A time-out was performed prior to initiating the procedure. Ultrasound was utilized to confirm patency of the right internal jugular vein. A permanent ultrasound image was recorded. The right neck and chest were prepped with chlorhexidine in a sterile fashion, and a sterile drape was applied covering the operative field. Maximum barrier sterile technique with sterile gowns and gloves were used for the procedure. Local anesthesia was provided with 1% lidocaine. After creating a small venotomy incision, a 21 gauge needle was advanced into the right internal jugular vein under direct, real-time ultrasound guidance. Ultrasound image documentation was performed. After securing guidewire access, an 8 Fr dilator was placed. A J-wire was kinked to measure appropriate catheter length. A subcutaneous port pocket was then created along the upper chest wall utilizing sharp and blunt dissection. Portable cautery was utilized. The pocket was irrigated with sterile saline. A single lumen power injectable port was chosen for placement. The 8 Fr catheter was tunneled from the port pocket site to the venotomy incision. The port was placed in the pocket. External catheter was trimmed to appropriate length based on guidewire measurement. At the venotomy, an 8 Fr peel-away sheath was placed over a guidewire. The catheter was then placed through the sheath and the sheath removed. Final catheter positioning was confirmed and documented with a fluoroscopic spot image. The port was accessed with a needle and aspirated and flushed with heparinized saline. The access needle was removed. The venotomy and port pocket incisions were closed with subcutaneous 3-0 Monocryl and subcuticular 4-0 Vicryl.  Dermabond was applied to both incisions. COMPLICATIONS: COMPLICATIONS None FINDINGS: After catheter placement, the tip lies at the cavo-atrial junction. The catheter aspirates normally and is ready for immediate use. IMPRESSION: Placement of single lumen port a cath via right internal jugular vein. The catheter tip lies at the cavo-atrial junction. A power injectable port a cath was placed and is ready for immediate use. Electronically Signed   By: Aletta Edouard M.D.   On: 07/27/2021 12:58   CT ABDOMINAL MASS BIOPSY  Result Date: 07/10/2021 INDICATION: Abdominal mass EXAM: CT-guided core needle biopsy of abdominal mass MEDICATIONS: None. ANESTHESIA/SEDATION: Moderate (conscious) sedation was employed during this procedure. A total of Versed 1 mg and Fentanyl 50 mcg was administered intravenously. Moderate Sedation Time: 20 minutes. The patient's level of consciousness and vital signs were monitored continuously by radiology nursing throughout the procedure under my direct supervision. FLUOROSCOPY TIME:  N/a COMPLICATIONS: None immediate. PROCEDURE: Informed written consent was obtained from the patient after a thorough discussion of the procedural risks, benefits and alternatives. All questions were addressed. Maximal Sterile Barrier Technique was utilized including caps, mask, sterile gowns, sterile gloves, sterile drape, hand hygiene and skin antiseptic. A timeout was performed prior to the initiation of the procedure. The patient was placed supine on the exam table. Limited CT of the abdomen and pelvis was performed for planning purposes, which again demonstrated a large central anterior abdominopelvic mass. Skin entry site was marked, and the overlying skin was prepped and draped in the standard sterile fashion. Local analgesia was obtained with 1% lidocaine. Using intermittent CT fluoroscopy, a 17 gauge introducer needle was advanced towards the mass. Prior to puncture of the peritoneal cavity, a loop  of  small bowel was noted to shift anterior to the mass. Skin entry site was replanned superior to this loop of bowel, and local analgesia was readministered. Again using CT fluoroscopy, a 17 gauge introducer needle was advanced into the abdominopelvic mass. Location was confirmed with CT. Subsequently, core needle biopsy was performed using an 18 gauge core biopsy device x6 total passes. Specimens were submitted in saline to pathology for further handling. Limited postprocedure imaging demonstrated no complicating features. The patient tolerated the procedure well without immediate complication. IMPRESSION: Successful CT-guided core needle biopsy of central abdominopelvic mass. Electronically Signed   By: Albin Felling M.D.   On: 07/10/2021 13:21     Assessment and plan- Patient is a 66 y.o. male with stage IVb extensive stage small cell lung cancer cT2 N0 M1 with brain and adrenal gland pancreatic and omental metastases. He is here for on treatment assessment prior to cycle 1 of carbo etoposide Tecentriq chemotherapy  AST and ALT are mildly abnormal but total bilirubin is normal. Therefore okay to proceed with cycle 1 of carbo etoposide chemotherapy at full dose along with Tecentriq on day 1 with a test site on day 2 and day 3.  The [place] on day 5.  He will get 1 L of extra fluids on day 5.  Return to clinic in 1 week and see covering NP for possible IV fluids with labs.  He will be seen by covering MD in 3 weeks for cycle 2 of treatment.  Brain metastases: S/p whole brain radiation treatment.  He is on tapering course of steroids but I am still proceeding with Tecentriq today as he has significant burden of disease.  Dyspnea: Suspect secondary to underlying COPD and cancer.  He does have significant wheezing on today's exam.  He did not qualify for home oxygen since he was not hypoxic on ambulation.  Resting heart rate in the 70s.  Clinical suspicion for pulmonary embolism is low at this time and I will  therefore hold off on a CT chest today.  I will see him back in 6 weeks for cycle 3 of carbo etoposide Tecentriq chemotherapy.     Visit Diagnosis 1. Encounter for antineoplastic chemotherapy   2. Encounter for antineoplastic immunotherapy   3. Small cell lung cancer ([place])      Dr. Randa Evens, MD, MPH [facility] [phone] 08/03/2021 8:29 AM

## 2021-08-03 NOTE — Patient Instructions (Signed)
[facility] CANCER CTR AT [place]  Discharge Instructions: Thank you for choosing [facility] to provide your oncology and hematology care.  If you have a lab appointment with the [place] [facility], please go directly to the [facility] and check in at the registration area.  Wear comfortable clothing and clothing appropriate for easy access to any Portacath or PICC line.   We strive to give you quality time with your provider. You may need to reschedule your appointment if you arrive late (15 or more minutes).  Arriving late affects you and other patients whose appointments are after yours.  Also, if you miss three or more appointments without notifying the office, you may be dismissed from the clinic at the provider's discretion.      For prescription refill requests, have your pharmacy contact our office and allow 72 hours for refills to be completed.    Today you received the following chemotherapy and/or immunotherapy agents Etoposide, Carboplatin and Tecentriq.      To help prevent nausea and vomiting after your treatment, we encourage you to take your nausea medication as directed.  BELOW ARE SYMPTOMS THAT SHOULD BE REPORTED IMMEDIATELY: *FEVER GREATER THAN 100.4 F (38 C) OR HIGHER *CHILLS OR SWEATING *NAUSEA AND VOMITING THAT IS NOT CONTROLLED WITH YOUR NAUSEA MEDICATION *UNUSUAL SHORTNESS OF BREATH *UNUSUAL BRUISING OR BLEEDING *URINARY PROBLEMS (pain or burning when urinating, or frequent urination) *BOWEL PROBLEMS (unusual diarrhea, constipation, pain near the anus) TENDERNESS IN MOUTH AND THROAT WITH OR WITHOUT PRESENCE OF ULCERS (sore throat, sores in mouth, or a toothache) UNUSUAL RASH, SWELLING OR PAIN  UNUSUAL VAGINAL DISCHARGE OR ITCHING   Items with * indicate a potential emergency and should be followed up as soon as possible or go to the Emergency Department if any problems should occur.  Please show the CHEMOTHERAPY ALERT CARD or IMMUNOTHERAPY  ALERT CARD at check-in to the Emergency Department and triage nurse.  Should you have questions after your visit or need to cancel or reschedule your appointment, please contact [facility] CANCER [place] AT [place]  [phone] and follow the prompts.  Office hours are 8:00 a.m. to 4:30 p.m. Monday - Friday. Please note that voicemails left after 4:00 p.m. may not be returned until the following business day.  We are closed weekends and major holidays. You have access to a nurse at all times for urgent questions. Please call the main number to the clinic [phone] and follow the prompts.  For any non-urgent questions, you may also contact your provider using MyChart. We now offer e-Visits for anyone 13 and older to request care online for non-urgent symptoms. For details visit [email or web address].   Also download the MyChart app! Go to the app store, search "MyChart", open the app, select [place], and log in with your MyChart username and password.  Masks are optional in the cancer centers. If you would like for your care team to wear a mask while they are taking care of you, please let them know. For doctor visits, patients may have with them one support person who is at least 66 years old. At this time, visitors are not allowed in the infusion area.

## 2021-08-03 NOTE — Telephone Encounter (Signed)
Maurice Simpson called reporting that the medicine for his nebulizer we ordered is on back order and has been for months. She is asking what to do, stating that they now have the machine.

## 2021-08-03 NOTE — Progress Notes (Signed)
Pt sob on exertion and sometimes with just talking. Resting saturation was 92%

## 2021-08-03 NOTE — Progress Notes (Signed)
Per MD ok to treat with elevated LFT

## 2021-08-04 ENCOUNTER — Inpatient Hospital Stay: Payer: Medicare HMO

## 2021-08-04 ENCOUNTER — Telehealth: Payer: Self-pay

## 2021-08-04 ENCOUNTER — Other Ambulatory Visit: Payer: Self-pay | Admitting: Oncology

## 2021-08-04 VITALS — BP 133/77 | HR 84 | Temp 98.2°F | Resp 19

## 2021-08-04 DIAGNOSIS — Z510 Encounter for antineoplastic radiation therapy: Secondary | ICD-10-CM | POA: Diagnosis not present

## 2021-08-04 DIAGNOSIS — Z5112 Encounter for antineoplastic immunotherapy: Secondary | ICD-10-CM | POA: Diagnosis not present

## 2021-08-04 DIAGNOSIS — C7972 Secondary malignant neoplasm of left adrenal gland: Secondary | ICD-10-CM | POA: Diagnosis not present

## 2021-08-04 DIAGNOSIS — C3490 Malignant neoplasm of unspecified part of unspecified bronchus or lung: Secondary | ICD-10-CM

## 2021-08-04 DIAGNOSIS — C3431 Malignant neoplasm of lower lobe, right bronchus or lung: Secondary | ICD-10-CM | POA: Diagnosis not present

## 2021-08-04 DIAGNOSIS — C7889 Secondary malignant neoplasm of other digestive organs: Secondary | ICD-10-CM | POA: Diagnosis not present

## 2021-08-04 DIAGNOSIS — C7931 Secondary malignant neoplasm of brain: Secondary | ICD-10-CM | POA: Diagnosis not present

## 2021-08-04 DIAGNOSIS — Z5111 Encounter for antineoplastic chemotherapy: Secondary | ICD-10-CM | POA: Diagnosis not present

## 2021-08-04 DIAGNOSIS — C7971 Secondary malignant neoplasm of right adrenal gland: Secondary | ICD-10-CM | POA: Diagnosis not present

## 2021-08-04 DIAGNOSIS — C786 Secondary malignant neoplasm of retroperitoneum and peritoneum: Secondary | ICD-10-CM | POA: Diagnosis not present

## 2021-08-04 LAB — T4: T4, Total: 6 ug/dL (ref 4.5–12.0)

## 2021-08-04 MED ORDER — HEPARIN SOD (PORK) LOCK FLUSH 100 UNIT/ML IV SOLN
INTRAVENOUS | Status: AC
  Filled 2021-08-04: qty 5

## 2021-08-04 MED ORDER — SODIUM CHLORIDE 0.9 % IV SOLN
100.0000 mg/m2 | Freq: Once | INTRAVENOUS | Status: AC
  Administered 2021-08-04: 220 mg via INTRAVENOUS
  Filled 2021-08-04: qty 11

## 2021-08-04 MED ORDER — HEPARIN SOD (PORK) LOCK FLUSH 100 UNIT/ML IV SOLN
500.0000 [IU] | Freq: Once | INTRAVENOUS | Status: AC | PRN
  Filled 2021-08-04: qty 5

## 2021-08-04 MED ORDER — SODIUM CHLORIDE 0.9 % IV SOLN
10.0000 mg | Freq: Once | INTRAVENOUS | Status: AC
  Administered 2021-08-04: 10 mg via INTRAVENOUS
  Filled 2021-08-04: qty 1

## 2021-08-04 MED ORDER — SODIUM CHLORIDE 0.9 % IV SOLN
Freq: Once | INTRAVENOUS | Status: AC
  Filled 2021-08-04: qty 250

## 2021-08-04 MED FILL — Dexamethasone Sodium Phosphate Inj 100 MG/10ML: INTRAMUSCULAR | Qty: 1 | Status: AC

## 2021-08-04 NOTE — Patient Instructions (Signed)
[facility] CANCER CTR AT [place]  Discharge Instructions: Thank you for choosing [place] to provide your oncology and hematology care.  If you have a lab appointment with the [place], please go directly to the [facility] and check in at the registration area.  Wear comfortable clothing and clothing appropriate for easy access to any Portacath or PICC line.   We strive to give you quality time with your provider. You may need to reschedule your appointment if you arrive late (15 or more minutes).  Arriving late affects you and other patients whose appointments are after yours.  Also, if you miss three or more appointments without notifying the office, you may be dismissed from the clinic at the provider's discretion.      For prescription refill requests, have your pharmacy contact our office and allow 72 hours for refills to be completed.    Today you received the following chemotherapy and/or immunotherapy agents etoposide    To help prevent nausea and vomiting after your treatment, we encourage you to take your nausea medication as directed.  BELOW ARE SYMPTOMS THAT SHOULD BE REPORTED IMMEDIATELY: *FEVER GREATER THAN 100.4 F (38 C) OR HIGHER *CHILLS OR SWEATING *NAUSEA AND VOMITING THAT IS NOT CONTROLLED WITH YOUR NAUSEA MEDICATION *UNUSUAL SHORTNESS OF BREATH *UNUSUAL BRUISING OR BLEEDING *URINARY PROBLEMS (pain or burning when urinating, or frequent urination) *BOWEL PROBLEMS (unusual diarrhea, constipation, pain near the anus) TENDERNESS IN MOUTH AND THROAT WITH OR WITHOUT PRESENCE OF ULCERS (sore throat, sores in mouth, or a toothache) UNUSUAL RASH, SWELLING OR PAIN  UNUSUAL VAGINAL DISCHARGE OR ITCHING   Items with * indicate a potential emergency and should be followed up as soon as possible or go to the Emergency Department if any problems should occur.  Please show the CHEMOTHERAPY ALERT CARD or IMMUNOTHERAPY ALERT CARD at check-in to the  Emergency Department and triage nurse.  Should you have questions after your visit or need to cancel or reschedule your appointment, please contact [facility] CANCER [place] AT [place]  [phone] and follow the prompts.  Office hours are 8:00 a.m. to 4:30 p.m. Monday - Friday. Please note that voicemails left after 4:00 p.m. may not be returned until the following business day.  We are closed weekends and major holidays. You have access to a nurse at all times for urgent questions. Please call the main number to the clinic [phone] and follow the prompts.  For any non-urgent questions, you may also contact your provider using MyChart. We now offer e-Visits for anyone 25 and older to request care online for non-urgent symptoms. For details visit [email or web address].   Also download the MyChart app! Go to the app store, search "MyChart", open the app, select [place], and log in with your MyChart username and password.  Masks are optional in the cancer centers. If you would like for your care team to wear a mask while they are taking care of you, please let them know. For doctor visits, patients may have with them one support person who is at least 66 years old. At this time, visitors are not allowed in the infusion area.

## 2021-08-04 NOTE — Telephone Encounter (Signed)
Telephone call to patient for follow up after receiving first infusion.   Patient wife states infusion went great.  States eating good and drinking plenty of fluids.   Denies any nausea or vomiting.  Encouraged patient to call for any concerns or questions.

## 2021-08-05 ENCOUNTER — Inpatient Hospital Stay: Payer: Medicare HMO

## 2021-08-05 ENCOUNTER — Encounter: Payer: Self-pay | Admitting: Oncology

## 2021-08-05 DIAGNOSIS — Z5112 Encounter for antineoplastic immunotherapy: Secondary | ICD-10-CM | POA: Diagnosis not present

## 2021-08-05 DIAGNOSIS — Z5111 Encounter for antineoplastic chemotherapy: Secondary | ICD-10-CM | POA: Diagnosis not present

## 2021-08-05 DIAGNOSIS — C7971 Secondary malignant neoplasm of right adrenal gland: Secondary | ICD-10-CM | POA: Diagnosis not present

## 2021-08-05 DIAGNOSIS — C7972 Secondary malignant neoplasm of left adrenal gland: Secondary | ICD-10-CM | POA: Diagnosis not present

## 2021-08-05 DIAGNOSIS — C7931 Secondary malignant neoplasm of brain: Secondary | ICD-10-CM | POA: Diagnosis not present

## 2021-08-05 DIAGNOSIS — C786 Secondary malignant neoplasm of retroperitoneum and peritoneum: Secondary | ICD-10-CM | POA: Diagnosis not present

## 2021-08-05 DIAGNOSIS — C7889 Secondary malignant neoplasm of other digestive organs: Secondary | ICD-10-CM | POA: Diagnosis not present

## 2021-08-05 DIAGNOSIS — Z510 Encounter for antineoplastic radiation therapy: Secondary | ICD-10-CM | POA: Diagnosis not present

## 2021-08-05 DIAGNOSIS — C3490 Malignant neoplasm of unspecified part of unspecified bronchus or lung: Secondary | ICD-10-CM

## 2021-08-05 DIAGNOSIS — C3431 Malignant neoplasm of lower lobe, right bronchus or lung: Secondary | ICD-10-CM | POA: Diagnosis not present

## 2021-08-05 MED ORDER — SODIUM CHLORIDE 0.9 % IV SOLN
Freq: Once | INTRAVENOUS | Status: AC
  Filled 2021-08-05: qty 250

## 2021-08-05 MED ORDER — SODIUM CHLORIDE 0.9 % IV SOLN
100.0000 mg/m2 | Freq: Once | INTRAVENOUS | Status: AC
  Administered 2021-08-05: 220 mg via INTRAVENOUS
  Filled 2021-08-05: qty 11

## 2021-08-05 MED ORDER — SODIUM CHLORIDE 0.9 % IV SOLN
10.0000 mg | Freq: Once | INTRAVENOUS | Status: AC
  Administered 2021-08-05: 10 mg via INTRAVENOUS
  Filled 2021-08-05: qty 10

## 2021-08-05 MED ORDER — HEPARIN SOD (PORK) LOCK FLUSH 100 UNIT/ML IV SOLN
500.0000 [IU] | Freq: Once | INTRAVENOUS | Status: DC | PRN
  Filled 2021-08-05: qty 5

## 2021-08-05 NOTE — Progress Notes (Signed)
Nutrition Follow-up:  Patient with small cell lung cancer with brain, omentum, pancreatic, and adrenal metastases.  Patient completed whole brain radiation on 7/21.  Patient has started carboplatin, etoposide and tecentriq.    Met with patient and wife during infusion.  Patient reports that he his taste is diminished.  Continuing to taper down steroids.  Has only eaten a grilled cheese sandwich today.      Medications: reviewed  Labs: reviewed  Anthropometrics:   Weight 211 lb 12.8 oz on 7/24  212 lb 9.6 oz on 7/12 UBW of 224-227 lb    NUTRITION DIAGNOSIS: Inadequate oral intake stable    INTERVENTION:  Discussed strategies to help with taste change.  Handout provided Patient agreeable to RD getting him snack during infusion     MONITORING, EVALUATION, GOAL: weight trends, intake   NEXT VISIT: Wednesday, August 16 during infusion  Maurice Simpson, [place], [place] Registered Dietitian [phone]

## 2021-08-05 NOTE — Patient Instructions (Signed)
[facility] AT [place]  Discharge Instructions: Thank you for choosing [facility] to provide your oncology and hematology care.  If you have a lab appointment with the [facility], please go directly to the [facility] and check in at the registration area.  Wear comfortable clothing and clothing appropriate for easy access to any Portacath or PICC line.   We strive to give you quality time with your provider. You may need to reschedule your appointment if you arrive late (15 or more minutes).  Arriving late affects you and other patients whose appointments are after yours.  Also, if you miss three or more appointments without notifying the office, you may be dismissed from the clinic at the provider's discretion.      For prescription refill requests, have your pharmacy contact our office and allow 72 hours for refills to be completed.    Today you received the following chemotherapy and/or immunotherapy agents ETOPOSIDE      To help prevent nausea and vomiting after your treatment, we encourage you to take your nausea medication as directed.  BELOW ARE SYMPTOMS THAT SHOULD BE REPORTED IMMEDIATELY: *FEVER GREATER THAN 100.4 F (38 C) OR HIGHER *CHILLS OR SWEATING *NAUSEA AND VOMITING THAT IS NOT CONTROLLED WITH YOUR NAUSEA MEDICATION *UNUSUAL SHORTNESS OF BREATH *UNUSUAL BRUISING OR BLEEDING *URINARY PROBLEMS (pain or burning when urinating, or frequent urination) *BOWEL PROBLEMS (unusual diarrhea, constipation, pain near the anus) TENDERNESS IN MOUTH AND THROAT WITH OR WITHOUT PRESENCE OF ULCERS (sore throat, sores in mouth, or a toothache) UNUSUAL RASH, SWELLING OR PAIN  UNUSUAL VAGINAL DISCHARGE OR ITCHING   Items with * indicate a potential emergency and should be followed up as soon as possible or go to the Emergency Department if any problems should occur.  Please show the CHEMOTHERAPY ALERT CARD or IMMUNOTHERAPY ALERT CARD at check-in to  the Emergency Department and triage nurse.  Should you have questions after your visit or need to cancel or reschedule your appointment, please contact [facility] [place] [facility] [place]  [phone] and follow the prompts.  Office hours are 8:00 a.m. to 4:30 p.m. Monday - Friday. Please note that voicemails left after 4:00 p.m. may not be returned until the following business day.  We are closed weekends and major holidays. You have access to a nurse at all times for urgent questions. Please call the main number to the clinic [phone] and follow the prompts.  For any non-urgent questions, you may also contact your provider using MyChart. We now offer e-Visits for anyone 14 and older to request care online for non-urgent symptoms. For details visit [email or web address].   Also download the MyChart app! Go to the app store, search "MyChart", open the app, select [facility], and log in with your MyChart username and password.  Masks are optional in the cancer centers. If you would like for your care team to wear a mask while they are taking care of you, please let them know. For doctor visits, patients may have with them one support person who is at least 66 years old. At this time, visitors are not allowed in the infusion area.  Etoposide, VP-16 injection What is this medication? ETOPOSIDE, VP-16 (e toe POE side) is a chemotherapy drug. It is used to treat testicular cancer, lung cancer, and other cancers. This medicine may be used for other purposes; ask your health care provider or pharmacist if you have questions. COMMON BRAND NAME(S): Etopophos, Toposar, VePesid What should  I tell my care team before I take this medication? They need to know if you have any of these conditions: infection kidney disease liver disease low blood counts, like low white cell, platelet, or red cell counts an unusual or allergic reaction to etoposide, other medicines, foods, dyes, or  preservatives pregnant or trying to get pregnant breast-feeding How should I use this medication? This medicine is for infusion into a vein. It is administered in a hospital or clinic by a specially trained health care professional. Talk to your pediatrician regarding the use of this medicine in children. Special care may be needed. Overdosage: If you think you have taken too much of this medicine contact a poison control center or emergency room at once. NOTE: This medicine is only for you. Do not share this medicine with others. What if I miss a dose? It is important not to miss your dose. Call your doctor or health care professional if you are unable to keep an appointment. What may interact with this medication? This medicine may interact with the following medications: warfarin This list may not describe all possible interactions. Give your health care provider a list of all the medicines, herbs, non-prescription drugs, or dietary supplements you use. Also tell them if you smoke, drink alcohol, or use illegal drugs. Some items may interact with your medicine. What should I watch for while using this medication? Visit your doctor for checks on your progress. This drug may make you feel generally unwell. This is not uncommon, as chemotherapy can affect healthy cells as well as cancer cells. Report any side effects. Continue your course of treatment even though you feel ill unless your doctor tells you to stop. In some cases, you may be given additional medicines to help with side effects. Follow all directions for their use. Call your doctor or health care professional for advice if you get a fever, chills or sore throat, or other symptoms of a cold or flu. Do not treat yourself. This drug decreases your body's ability to fight infections. Try to avoid being around people who are sick. This medicine may increase your risk to bruise or bleed. Call your doctor or health care professional if you  notice any unusual bleeding. Talk to your doctor about your risk of cancer. You may be more at risk for certain types of cancers if you take this medicine. Do not become pregnant while taking this medicine or for at least 6 months after stopping it. Women should inform their doctor if they wish to become pregnant or think they might be pregnant. Women of child-bearing potential will need to have a negative pregnancy test before starting this medicine. There is a potential for serious side effects to an unborn child. Talk to your health care professional or pharmacist for more information. Do not breast-feed an infant while taking this medicine. Men must use a latex condom during sexual contact with a woman while taking this medicine and for at least 4 months after stopping it. A latex condom is needed even if you have had a vasectomy. Contact your doctor right away if your partner becomes pregnant. Do not donate sperm while taking this medicine and for at least 4 months after you stop taking this medicine. Men should inform their doctors if they wish to father a child. This medicine may lower sperm counts. What side effects may I notice from receiving this medication? Side effects that you should report to your doctor or health care professional as  soon as possible: allergic reactions like skin rash, itching or hives, swelling of the face, lips, or tongue low blood counts - this medicine may decrease the number of white blood cells, red blood cells, and platelets. You may be at increased risk for infections and bleeding nausea, vomiting redness, blistering, peeling or loosening of the skin, including inside the mouth signs and symptoms of infection like fever; chills; cough; sore throat; pain or trouble passing urine signs and symptoms of low red blood cells or anemia such as unusually weak or tired; feeling faint or lightheaded; falls; breathing problems unusual bruising or bleeding Side effects that  usually do not require medical attention (report to your doctor or health care professional if they continue or are bothersome): changes in taste diarrhea hair loss loss of appetite mouth sores This list may not describe all possible side effects. Call your doctor for medical advice about side effects. You may report side effects to FDA at 1-800-FDA-1088. Where should I keep my medication? This drug is given in a hospital or clinic and will not be stored at home. NOTE: This sheet is a summary. It may not cover all possible information. If you have questions about this medicine, talk to your doctor, pharmacist, or health care provider.  2023 Elsevier/Gold Standard (2020-11-28 00:00:00)

## 2021-08-06 ENCOUNTER — Telehealth: Payer: Medicare HMO | Admitting: Nurse Practitioner

## 2021-08-07 ENCOUNTER — Other Ambulatory Visit: Payer: Self-pay

## 2021-08-07 ENCOUNTER — Other Ambulatory Visit: Payer: Self-pay | Admitting: *Deleted

## 2021-08-07 ENCOUNTER — Telehealth: Payer: Self-pay

## 2021-08-07 ENCOUNTER — Telehealth: Payer: Self-pay | Admitting: *Deleted

## 2021-08-07 ENCOUNTER — Ambulatory Visit
Admission: RE | Admit: 2021-08-07 | Discharge: 2021-08-07 | Disposition: A | Discharge status: Home / Self Care | Payer: Medicare HMO | Source: Ambulatory Visit | Attending: Medical Oncology | Admitting: Medical Oncology

## 2021-08-07 ENCOUNTER — Encounter: Payer: Self-pay | Admitting: Medical Oncology

## 2021-08-07 ENCOUNTER — Inpatient Hospital Stay (HOSPITAL_BASED_OUTPATIENT_CLINIC_OR_DEPARTMENT_OTHER): Payer: Medicare HMO | Admitting: Medical Oncology

## 2021-08-07 ENCOUNTER — Inpatient Hospital Stay: Payer: Medicare HMO

## 2021-08-07 VITALS — BP 132/78 | HR 75 | Temp 97.1°F | Resp 18

## 2021-08-07 DIAGNOSIS — R0602 Shortness of breath: Secondary | ICD-10-CM

## 2021-08-07 DIAGNOSIS — C7931 Secondary malignant neoplasm of brain: Secondary | ICD-10-CM | POA: Diagnosis not present

## 2021-08-07 DIAGNOSIS — C3490 Malignant neoplasm of unspecified part of unspecified bronchus or lung: Secondary | ICD-10-CM

## 2021-08-07 DIAGNOSIS — R0902 Hypoxemia: Secondary | ICD-10-CM

## 2021-08-07 DIAGNOSIS — Z5111 Encounter for antineoplastic chemotherapy: Secondary | ICD-10-CM | POA: Diagnosis not present

## 2021-08-07 DIAGNOSIS — R079 Chest pain, unspecified: Secondary | ICD-10-CM | POA: Diagnosis not present

## 2021-08-07 DIAGNOSIS — C7972 Secondary malignant neoplasm of left adrenal gland: Secondary | ICD-10-CM | POA: Diagnosis not present

## 2021-08-07 DIAGNOSIS — Z510 Encounter for antineoplastic radiation therapy: Secondary | ICD-10-CM | POA: Diagnosis not present

## 2021-08-07 DIAGNOSIS — Z5112 Encounter for antineoplastic immunotherapy: Secondary | ICD-10-CM | POA: Diagnosis not present

## 2021-08-07 DIAGNOSIS — C3431 Malignant neoplasm of lower lobe, right bronchus or lung: Secondary | ICD-10-CM | POA: Diagnosis not present

## 2021-08-07 DIAGNOSIS — C786 Secondary malignant neoplasm of retroperitoneum and peritoneum: Secondary | ICD-10-CM | POA: Diagnosis not present

## 2021-08-07 DIAGNOSIS — C7971 Secondary malignant neoplasm of right adrenal gland: Secondary | ICD-10-CM | POA: Diagnosis not present

## 2021-08-07 DIAGNOSIS — C7889 Secondary malignant neoplasm of other digestive organs: Secondary | ICD-10-CM | POA: Diagnosis not present

## 2021-08-07 LAB — COMPREHENSIVE METABOLIC PANEL
ALT: 92 U/L — ABNORMAL HIGH (ref 0–44)
AST: 82 U/L — ABNORMAL HIGH (ref 15–41)
Albumin: 3.6 g/dL (ref 3.5–5.0)
Alkaline Phosphatase: 62 U/L (ref 38–126)
Anion gap: 11 (ref 5–15)
BUN: 23 mg/dL (ref 8–23)
CO2: 26 mmol/L (ref 22–32)
Calcium: 9.3 mg/dL (ref 8.9–10.3)
Chloride: 98 mmol/L (ref 98–111)
Creatinine, Ser: 0.87 mg/dL (ref 0.61–1.24)
GFR, Estimated: >60 mL/min (ref >60)
Glucose, Bld: 171 mg/dL — ABNORMAL HIGH (ref 70–99)
Potassium: 4.8 mmol/L (ref 3.5–5.1)
Sodium: 135 mmol/L (ref 135–145)
Total Bilirubin: 1.8 mg/dL — ABNORMAL HIGH (ref 0.3–1.2)
Total Protein: 7.2 g/dL (ref 6.5–8.1)

## 2021-08-07 LAB — CBC WITH DIFFERENTIAL/PLATELET
Abs Immature Granulocytes: 0.07 10*3/uL (ref 0.00–0.07)
Basophils Absolute: 0 10*3/uL (ref 0.0–0.1)
Basophils Relative: 0 %
Eosinophils Absolute: 0 10*3/uL (ref 0.0–0.5)
Eosinophils Relative: 0 %
HCT: 47.4 % (ref 39.0–52.0)
Hemoglobin: 16.5 g/dL (ref 13.0–17.0)
Immature Granulocytes: 1 %
Lymphocytes Relative: 10 %
Lymphs Abs: 0.8 10*3/uL (ref 0.7–4.0)
MCH: 31.8 pg (ref 26.0–34.0)
MCHC: 34.8 g/dL (ref 30.0–36.0)
MCV: 91.3 fL (ref 80.0–100.0)
Monocytes Absolute: 0 10*3/uL — ABNORMAL LOW (ref 0.1–1.0)
Monocytes Relative: 1 %
Neutro Abs: 6.8 10*3/uL (ref 1.7–7.7)
Neutrophils Relative %: 88 %
Platelets: 134 10*3/uL — ABNORMAL LOW (ref 150–400)
RBC: 5.19 MIL/uL (ref 4.22–5.81)
RDW: 13.2 % (ref 11.5–15.5)
WBC: 7.7 10*3/uL (ref 4.0–10.5)
nRBC: 0 % (ref 0.0–0.2)

## 2021-08-07 MED ORDER — IOHEXOL 350 MG/ML SOLN
75.0000 mL | Freq: Once | INTRAVENOUS | Status: AC | PRN
  Administered 2021-08-07: 75 mL via INTRAVENOUS

## 2021-08-07 MED ORDER — PEGFILGRASTIM-CBQV 6 MG/0.6ML ~~LOC~~ SOSY
6.0000 mg | PREFILLED_SYRINGE | Freq: Once | SUBCUTANEOUS | Status: AC
  Administered 2021-08-07: 6 mg via SUBCUTANEOUS

## 2021-08-07 NOTE — Progress Notes (Addendum)
Symptom Management [place] at [facility] Telephone:([phone] Fax:([phone]) [phone]  Patient Care Team: [name], NP as PCP - General (Nurse Practitioner) Telford Nab, RN as Oncology Nurse Navigator   Name of the patient: Maurice Simpson  [id number]  02-Jan-1956   Date of visit: 08/07/21  Reason for Consult: Maurice Simpson is a 66 y.o. male who presents today for:  Hypoxia: Patient with lung cancer and COPD presents with his wife to clinic for concerns of hypoxia. They report that he woke up at 4 am feeling SOB, fatigued, nauseated. Was able to eat a few bites of cereal but has not had an appetite for anymore. They deny chest pain but states that he has mild discomfort when he coughs, fevers, confusion. Cough reported from SOB. No hemoptysis, fever, diaphoresis. He does report that a few days ago he had calf pain in his left calf that lasted for a few days and resolved recently.    PAST MEDICAL HISTORY: Past Medical History:  Diagnosis Date   Cancer ([place])    COPD (chronic obstructive pulmonary disease) ([place])    Dyspnea     PAST SURGICAL HISTORY:  Past Surgical History:  Procedure Laterality Date   CATARACT EXTRACTION Right 03/10/2021   IR IMAGING GUIDED PORT INSERTION  07/27/2021    HEMATOLOGY/ONCOLOGY HISTORY:  Oncology History  Small cell lung cancer ([place])  07/21/2021 Initial Diagnosis   Small cell lung cancer ([place])   07/21/2021 Cancer Staging   Staging form: Lung, AJCC 8th Edition - Clinical stage from 07/21/2021: Stage IVB (cT2, cN0, pM1c) - Signed by Sindy Guadeloupe, MD on 07/21/2021   08/03/2021 -  Chemotherapy   Patient is on Treatment Plan : LUNG SCLC Carboplatin + Etoposide + Atezolizumab Induction q21d / Atezolizumab Maintenance q21d       ALLERGIES:  is allergic to bee venom.  MEDICATIONS:  Current Outpatient Medications  Medication Sig Dispense Refill   albuterol (PROVENTIL) (2.5 MG/3ML) 0.083% nebulizer solution Take 3  mLs (2.5 mg total) by nebulization every 6 (six) hours as needed for wheezing or shortness of breath. 75 mL 12   budesonide (PULMICORT) 0.25 MG/2ML nebulizer solution Take 2 mLs (0.25 mg total) by nebulization 2 (two) times daily as needed. 60 mL 12   dexamethasone (DECADRON) 4 MG tablet Take 1 tablet by mouth twice a day for 7 days, then take 1 tablet by mouth daily for 7 days, then take 0.5 tablet by mouth daily for 7 days, then stop 30 tablet 0   EPINEPHrine 0.3 mg/0.3 mL IJ SOAJ injection Inject 0.3 mg into the muscle as needed for anaphylaxis. 1 each 4   Glycopyrrolate-Formoterol (BEVESPI AEROSPHERE) 9-4.8 MCG/ACT AERO Inhale 2 puffs into the lungs daily. 10.7 g 12   lidocaine-prilocaine (EMLA) cream Apply to affected area once 30 g 3   loratadine (CLARITIN) 10 MG tablet Take 1 tablet (10 mg total) by mouth daily. 30 tablet 11   LORazepam (ATIVAN) 0.5 MG tablet Take 1 tablet (0.5 mg total) by mouth every 6 (six) hours as needed (Nausea or vomiting). 30 tablet 0   ondansetron (ZOFRAN) 4 MG tablet Take 1 tablet (4 mg total) by mouth every 8 (eight) hours as needed for nausea or vomiting. 60 tablet 3   oxyCODONE (OXY IR/ROXICODONE) 5 MG immediate release tablet Take 1 tablet (5 mg total) by mouth every 4 (four) hours as needed for severe pain. 60 tablet 0   pantoprazole (PROTONIX) 20 MG tablet TAKE  1 TABLET BY MOUTH EVERY DAY 90 tablet 0   prochlorperazine (COMPAZINE) 10 MG tablet Take 1 tablet (10 mg total) by mouth every 6 (six) hours as needed for nausea or vomiting. 60 tablet 2   traZODone (DESYREL) 50 MG tablet Take 1 tablet (50 mg total) by mouth at bedtime as needed for sleep. 30 tablet 2   No current facility-administered medications for this visit.   Facility-Administered Medications Ordered in Other Visits  Medication Dose Route Frequency Provider Last Rate Last Admin   pegfilgrastim-cbqv (UDENYCA) injection 6 mg  6 mg Subcutaneous Once Sindy Guadeloupe, MD        VITAL SIGNS: BP  129/73   Pulse 74   Resp 18   SpO2 (!) 88%  There were no vitals filed for this visit.  Estimated body mass index is 32.2 kg/m as calculated from the following:   Height as of 08/03/21: 5\' 8"  (1.727 m).   Weight as of 08/03/21: 211 lb 12.8 oz (96.1 kg).  LABS: CBC:    Component Value Date/Time   WBC 14.6 (H) 08/03/2021 0821   HGB 17.5 (H) 08/03/2021 0821   HGB 18.0 (H) 04/30/2021 1136   HCT 49.8 08/03/2021 0821   HCT 53.3 (H) 04/30/2021 1136   PLT 148 (L) 08/03/2021 0821   PLT 156 04/30/2021 1136   MCV 91.0 08/03/2021 0821   MCV 91 04/30/2021 1136   MCV 94 01/21/2011 0134   NEUTROABS 11.5 (H) 08/03/2021 0821   NEUTROABS 4.8 04/30/2021 1136   LYMPHSABS 1.8 08/03/2021 0821   LYMPHSABS 2.9 04/30/2021 1136   MONOABS 0.6 08/03/2021 0821   EOSABS 0.1 08/03/2021 0821   EOSABS 0.3 04/30/2021 1136   BASOSABS 0.1 08/03/2021 0821   BASOSABS 0.1 04/30/2021 1136   Comprehensive Metabolic Panel:    Component Value Date/Time   NA 132 (L) 08/03/2021 0821   NA 136 04/30/2021 1136   NA 139 01/21/2011 0134   K 3.8 08/03/2021 0821   K 3.8 01/21/2011 0134   CL 98 08/03/2021 0821   CL 105 01/21/2011 0134   CO2 25 08/03/2021 0821   CO2 23 01/21/2011 0134   BUN 21 08/03/2021 0821   BUN 12 04/30/2021 1136   BUN 15 01/21/2011 0134   CREATININE 0.77 08/03/2021 0821   CREATININE 0.92 01/21/2011 0134   GLUCOSE 221 (H) 08/03/2021 0821   GLUCOSE 118 (H) 01/21/2011 0134   CALCIUM 8.8 (L) 08/03/2021 0821   CALCIUM 9.1 01/21/2011 0134   AST 57 (H) 08/03/2021 0821   AST 20 01/21/2011 0134   ALT 100 (H) 08/03/2021 0821   ALT 29 01/21/2011 0134   ALKPHOS 70 08/03/2021 0821   ALKPHOS 46 (L) 01/21/2011 0134   BILITOT 1.0 08/03/2021 0821   BILITOT 0.8 04/30/2021 1136   BILITOT 0.8 01/21/2011 0134   PROT 7.2 08/03/2021 0821   PROT 7.2 04/30/2021 1136   PROT 7.4 01/21/2011 0134   ALBUMIN 3.4 (L) 08/03/2021 0821   ALBUMIN 4.3 04/30/2021 1136   ALBUMIN 3.8 01/21/2011 0134    RADIOGRAPHIC  STUDIES: IR IMAGING GUIDED PORT INSERTION  Result Date: 07/27/2021 CLINICAL DATA:  Metastatic small cell lung carcinoma and need for porta cath for chemotherapy. EXAM: IMPLANTED PORT A CATH PLACEMENT WITH ULTRASOUND AND FLUOROSCOPIC GUIDANCE ANESTHESIA/SEDATION: Moderate (conscious) sedation was employed during this procedure. A total of Versed 2.0 mg and Fentanyl 100 mcg was administered intravenously by radiology nursing. Moderate Sedation Time: 20 minutes. The patient's level of consciousness and vital signs were monitored  continuously by radiology nursing throughout the procedure under my direct supervision. FLUOROSCOPY: 36 seconds.  11.4 mGy. PROCEDURE: The procedure, risks, benefits, and alternatives were explained to the patient. Questions regarding the procedure were encouraged and answered. The patient understands and consents to the procedure. A time-out was performed prior to initiating the procedure. Ultrasound was utilized to confirm patency of the right internal jugular vein. A permanent ultrasound image was recorded. The right neck and chest were prepped with chlorhexidine in a sterile fashion, and a sterile drape was applied covering the operative field. Maximum barrier sterile technique with sterile gowns and gloves were used for the procedure. Local anesthesia was provided with 1% lidocaine. After creating a small venotomy incision, a 21 gauge needle was advanced into the right internal jugular vein under direct, real-time ultrasound guidance. Ultrasound image documentation was performed. After securing guidewire access, an 8 Fr dilator was placed. A J-wire was kinked to measure appropriate catheter length. A subcutaneous port pocket was then created along the upper chest wall utilizing sharp and blunt dissection. Portable cautery was utilized. The pocket was irrigated with sterile saline. A single lumen power injectable port was chosen for placement. The 8 Fr catheter was tunneled from the port  pocket site to the venotomy incision. The port was placed in the pocket. External catheter was trimmed to appropriate length based on guidewire measurement. At the venotomy, an 8 Fr peel-away sheath was placed over a guidewire. The catheter was then placed through the sheath and the sheath removed. Final catheter positioning was confirmed and documented with a fluoroscopic spot image. The port was accessed with a needle and aspirated and flushed with heparinized saline. The access needle was removed. The venotomy and port pocket incisions were closed with subcutaneous 3-0 Monocryl and subcuticular 4-0 Vicryl. Dermabond was applied to both incisions. COMPLICATIONS: COMPLICATIONS None FINDINGS: After catheter placement, the tip lies at the cavo-atrial junction. The catheter aspirates normally and is ready for immediate use. IMPRESSION: Placement of single lumen port a cath via right internal jugular vein. The catheter tip lies at the cavo-atrial junction. A power injectable port a cath was placed and is ready for immediate use. Electronically Signed   By: Aletta Edouard M.D.   On: 07/27/2021 12:58   CT ABDOMINAL MASS BIOPSY  Result Date: 07/10/2021 INDICATION: Abdominal mass EXAM: CT-guided core needle biopsy of abdominal mass MEDICATIONS: None. ANESTHESIA/SEDATION: Moderate (conscious) sedation was employed during this procedure. A total of Versed 1 mg and Fentanyl 50 mcg was administered intravenously. Moderate Sedation Time: 20 minutes. The patient's level of consciousness and vital signs were monitored continuously by radiology nursing throughout the procedure under my direct supervision. FLUOROSCOPY TIME:  N/a COMPLICATIONS: None immediate. PROCEDURE: Informed written consent was obtained from the patient after a thorough discussion of the procedural risks, benefits and alternatives. All questions were addressed. Maximal Sterile Barrier Technique was utilized including caps, mask, sterile gowns, sterile  gloves, sterile drape, hand hygiene and skin antiseptic. A timeout was performed prior to the initiation of the procedure. The patient was placed supine on the exam table. Limited CT of the abdomen and pelvis was performed for planning purposes, which again demonstrated a large central anterior abdominopelvic mass. Skin entry site was marked, and the overlying skin was prepped and draped in the standard sterile fashion. Local analgesia was obtained with 1% lidocaine. Using intermittent CT fluoroscopy, a 17 gauge introducer needle was advanced towards the mass. Prior to puncture of the peritoneal cavity, a loop of small  bowel was noted to shift anterior to the mass. Skin entry site was replanned superior to this loop of bowel, and local analgesia was readministered. Again using CT fluoroscopy, a 17 gauge introducer needle was advanced into the abdominopelvic mass. Location was confirmed with CT. Subsequently, core needle biopsy was performed using an 18 gauge core biopsy device x6 total passes. Specimens were submitted in saline to pathology for further handling. Limited postprocedure imaging demonstrated no complicating features. The patient tolerated the procedure well without immediate complication. IMPRESSION: Successful CT-guided core needle biopsy of central abdominopelvic mass. Electronically Signed   By: Albin Felling M.D.   On: 07/10/2021 13:21    PERFORMANCE STATUS (ECOG) : 3 - Symptomatic, >50% confined to bed  Review of Systems Unless otherwise noted, a complete review of systems is negative.  Physical Exam General: Mildly grey appearing, Appears uncomfortable. In wheelchair  Cardiovascular: regular rate and rhythm Pulmonary: Rhonchi throughout. No decrease in lung fields.  Extremities: no edema, no joint deformities Neurological: Weakness but otherwise nonfocal  Assessment and Plan- Patient is a 66 y.o. male    Encounter Diagnoses  Name Primary?   Hypoxia Yes   Small cell lung cancer  (HCC)    SOB (shortness of breath)    New. 87% on room air. Concerning for PE given his history and HPI. EKG stable. Labs and STAT Ct chest pending. STAT home oxygen ordered. Low threshold ER. Discussed red flags. Follow up on Monday. Spoke with representative from home oxygen company who will be providing oxygen within the hour to patient. He is currently using our supplemental oxygen while in clinic and while getting his CT scan.     Patient expressed understanding and was in agreement with this plan. He also understands that He can call clinic at any time with any questions, concerns, or complaints.   Thank you for allowing me to participate in the care of this very pleasant patient.   Time Total: 30  Visit consisted of counseling and education dealing with the complex and emotionally intense issues of symptom management in the setting of serious illness.Greater than 50%  of this time was spent counseling and coordinating care related to the above assessment and plan.  Signed by: Nelwyn Salisbury, PA-C

## 2021-08-07 NOTE — Telephone Encounter (Signed)
Patient headed over to medical mall for CT scan

## 2021-08-07 NOTE — Telephone Encounter (Signed)
Called the pt 's wife phone and got her voicemail and left a message that there was no blood clot in his lungs, no pleural effusion- which is accumulation of fluid in lungs-which is a good thing not to have. Dr. Janese Banks feels like it is the cancer that is causing it and hopefully he will feel alittle better when the oxygen is set up. The oxygen will be brought out as soon as he gets home.if they have questions to let us know

## 2021-08-07 NOTE — Telephone Encounter (Addendum)
Pt came intoday to get injection after chemo. His wife says that he is 87% sitting down on pulse ox. He can't sleep in bed with or without pillows. He has been in recliner. He is weak and only ate some cereal and it did not taste so he did not eat much. He drank water and decaf tea today. I checked his sat 90 % on and at rest it came down to 87% Let him rest few minutes and put him on 1 liter of oxygen and sat came up to 92%.at rest Will call to see if we can get him set up for oxygen

## 2021-08-07 NOTE — Progress Notes (Signed)
Patient states he is wheezing and having shortness of breathe. Patient had cereal this morning but didn't taste good.

## 2021-08-10 ENCOUNTER — Inpatient Hospital Stay: Payer: Medicare HMO

## 2021-08-10 ENCOUNTER — Other Ambulatory Visit: Payer: Self-pay

## 2021-08-10 ENCOUNTER — Inpatient Hospital Stay: Payer: Medicare HMO | Admitting: Medical Oncology

## 2021-08-10 MED ORDER — MAGIC MOUTHWASH W/LIDOCAINE: 5.0000 mL | Freq: Four times a day (QID) | ORAL | 0 refills | Status: DC

## 2021-08-12 ENCOUNTER — Encounter: Payer: Self-pay | Admitting: *Deleted

## 2021-08-12 ENCOUNTER — Emergency Department: Payer: Medicare HMO

## 2021-08-12 ENCOUNTER — Other Ambulatory Visit: Payer: Self-pay | Admitting: Oncology

## 2021-08-12 ENCOUNTER — Inpatient Hospital Stay
Admission: EM | Admit: 2021-08-12 | Discharge: 2021-08-15 | DRG: 871 | Disposition: A | Discharge status: Home / Self Care | Payer: Medicare HMO | Source: Ambulatory Visit | Attending: Family Medicine | Admitting: Family Medicine

## 2021-08-12 ENCOUNTER — Other Ambulatory Visit: Payer: Self-pay

## 2021-08-12 ENCOUNTER — Inpatient Hospital Stay: Payer: Medicare HMO

## 2021-08-12 ENCOUNTER — Inpatient Hospital Stay (HOSPITAL_BASED_OUTPATIENT_CLINIC_OR_DEPARTMENT_OTHER): Payer: Medicare HMO | Admitting: Hospice and Palliative Medicine

## 2021-08-12 ENCOUNTER — Inpatient Hospital Stay: Payer: Medicare HMO | Attending: Oncology

## 2021-08-12 DIAGNOSIS — R0902 Hypoxemia: Secondary | ICD-10-CM | POA: Diagnosis present

## 2021-08-12 DIAGNOSIS — D6481 Anemia due to antineoplastic chemotherapy: Secondary | ICD-10-CM | POA: Diagnosis present

## 2021-08-12 DIAGNOSIS — J189 Pneumonia, unspecified organism: Secondary | ICD-10-CM | POA: Diagnosis not present

## 2021-08-12 DIAGNOSIS — C7971 Secondary malignant neoplasm of right adrenal gland: Secondary | ICD-10-CM | POA: Diagnosis not present

## 2021-08-12 DIAGNOSIS — Z8249 Family history of ischemic heart disease and other diseases of the circulatory system: Secondary | ICD-10-CM | POA: Diagnosis not present

## 2021-08-12 DIAGNOSIS — F17210 Nicotine dependence, cigarettes, uncomplicated: Secondary | ICD-10-CM | POA: Insufficient documentation

## 2021-08-12 DIAGNOSIS — D701 Agranulocytosis secondary to cancer chemotherapy: Secondary | ICD-10-CM | POA: Diagnosis not present

## 2021-08-12 DIAGNOSIS — C7931 Secondary malignant neoplasm of brain: Secondary | ICD-10-CM | POA: Diagnosis present

## 2021-08-12 DIAGNOSIS — Z79899 Other long term (current) drug therapy: Secondary | ICD-10-CM | POA: Insufficient documentation

## 2021-08-12 DIAGNOSIS — D709 Neutropenia, unspecified: Secondary | ICD-10-CM | POA: Diagnosis not present

## 2021-08-12 DIAGNOSIS — C7889 Secondary malignant neoplasm of other digestive organs: Secondary | ICD-10-CM | POA: Diagnosis not present

## 2021-08-12 DIAGNOSIS — J440 Chronic obstructive pulmonary disease with acute lower respiratory infection: Secondary | ICD-10-CM | POA: Diagnosis present

## 2021-08-12 DIAGNOSIS — C786 Secondary malignant neoplasm of retroperitoneum and peritoneum: Secondary | ICD-10-CM | POA: Diagnosis present

## 2021-08-12 DIAGNOSIS — J188 Other pneumonia, unspecified organism: Secondary | ICD-10-CM | POA: Diagnosis present

## 2021-08-12 DIAGNOSIS — E872 Acidosis, unspecified: Secondary | ICD-10-CM | POA: Diagnosis present

## 2021-08-12 DIAGNOSIS — Z833 Family history of diabetes mellitus: Secondary | ICD-10-CM

## 2021-08-12 DIAGNOSIS — Z95828 Presence of other vascular implants and grafts: Secondary | ICD-10-CM | POA: Diagnosis not present

## 2021-08-12 DIAGNOSIS — R7881 Bacteremia: Secondary | ICD-10-CM | POA: Diagnosis not present

## 2021-08-12 DIAGNOSIS — R0602 Shortness of breath: Secondary | ICD-10-CM | POA: Diagnosis not present

## 2021-08-12 DIAGNOSIS — A419 Sepsis, unspecified organism: Secondary | ICD-10-CM | POA: Diagnosis not present

## 2021-08-12 DIAGNOSIS — G893 Neoplasm related pain (acute) (chronic): Secondary | ICD-10-CM | POA: Diagnosis not present

## 2021-08-12 DIAGNOSIS — C7972 Secondary malignant neoplasm of left adrenal gland: Secondary | ICD-10-CM | POA: Diagnosis not present

## 2021-08-12 DIAGNOSIS — E876 Hypokalemia: Secondary | ICD-10-CM | POA: Insufficient documentation

## 2021-08-12 DIAGNOSIS — Z9981 Dependence on supplemental oxygen: Secondary | ICD-10-CM | POA: Diagnosis not present

## 2021-08-12 DIAGNOSIS — Z5112 Encounter for antineoplastic immunotherapy: Secondary | ICD-10-CM | POA: Diagnosis not present

## 2021-08-12 DIAGNOSIS — B965 Pseudomonas (aeruginosa) (mallei) (pseudomallei) as the cause of diseases classified elsewhere: Secondary | ICD-10-CM | POA: Diagnosis not present

## 2021-08-12 DIAGNOSIS — B009 Herpesviral infection, unspecified: Secondary | ICD-10-CM | POA: Diagnosis present

## 2021-08-12 DIAGNOSIS — D6959 Other secondary thrombocytopenia: Secondary | ICD-10-CM | POA: Diagnosis present

## 2021-08-12 DIAGNOSIS — C7970 Secondary malignant neoplasm of unspecified adrenal gland: Secondary | ICD-10-CM | POA: Diagnosis not present

## 2021-08-12 DIAGNOSIS — D703 Neutropenia due to infection: Secondary | ICD-10-CM | POA: Diagnosis present

## 2021-08-12 DIAGNOSIS — R109 Unspecified abdominal pain: Secondary | ICD-10-CM | POA: Diagnosis present

## 2021-08-12 DIAGNOSIS — C3490 Malignant neoplasm of unspecified part of unspecified bronchus or lung: Secondary | ICD-10-CM | POA: Insufficient documentation

## 2021-08-12 DIAGNOSIS — E871 Hypo-osmolality and hyponatremia: Secondary | ICD-10-CM | POA: Diagnosis present

## 2021-08-12 DIAGNOSIS — C3491 Malignant neoplasm of unspecified part of right bronchus or lung: Secondary | ICD-10-CM | POA: Diagnosis not present

## 2021-08-12 DIAGNOSIS — J168 Pneumonia due to other specified infectious organisms: Secondary | ICD-10-CM | POA: Diagnosis not present

## 2021-08-12 DIAGNOSIS — T451X5A Adverse effect of antineoplastic and immunosuppressive drugs, initial encounter: Secondary | ICD-10-CM | POA: Diagnosis present

## 2021-08-12 DIAGNOSIS — R5081 Fever presenting with conditions classified elsewhere: Secondary | ICD-10-CM | POA: Diagnosis present

## 2021-08-12 DIAGNOSIS — R110 Nausea: Secondary | ICD-10-CM | POA: Diagnosis not present

## 2021-08-12 DIAGNOSIS — R7989 Other specified abnormal findings of blood chemistry: Secondary | ICD-10-CM | POA: Diagnosis present

## 2021-08-12 DIAGNOSIS — Z515 Encounter for palliative care: Secondary | ICD-10-CM | POA: Diagnosis not present

## 2021-08-12 DIAGNOSIS — D696 Thrombocytopenia, unspecified: Secondary | ICD-10-CM | POA: Diagnosis present

## 2021-08-12 DIAGNOSIS — Z5111 Encounter for antineoplastic chemotherapy: Secondary | ICD-10-CM | POA: Diagnosis not present

## 2021-08-12 DIAGNOSIS — A4152 Sepsis due to Pseudomonas: Secondary | ICD-10-CM | POA: Diagnosis not present

## 2021-08-12 DIAGNOSIS — J449 Chronic obstructive pulmonary disease, unspecified: Secondary | ICD-10-CM | POA: Diagnosis present

## 2021-08-12 DIAGNOSIS — Z85118 Personal history of other malignant neoplasm of bronchus and lung: Secondary | ICD-10-CM | POA: Diagnosis not present

## 2021-08-12 DIAGNOSIS — R55 Syncope and collapse: Secondary | ICD-10-CM | POA: Diagnosis not present

## 2021-08-12 LAB — COMPREHENSIVE METABOLIC PANEL
ALT: 81 U/L — ABNORMAL HIGH (ref 0–44)
AST: 35 U/L (ref 15–41)
Albumin: 3.8 g/dL (ref 3.5–5.0)
Alkaline Phosphatase: 77 U/L (ref 38–126)
Anion gap: 10 (ref 5–15)
BUN: 19 mg/dL (ref 8–23)
CO2: 26 mmol/L (ref 22–32)
Calcium: 9.1 mg/dL (ref 8.9–10.3)
Chloride: 97 mmol/L — ABNORMAL LOW (ref 98–111)
Creatinine, Ser: 0.84 mg/dL (ref 0.61–1.24)
GFR, Estimated: >60 mL/min (ref >60)
Glucose, Bld: 172 mg/dL — ABNORMAL HIGH (ref 70–99)
Potassium: 4.1 mmol/L (ref 3.5–5.1)
Sodium: 133 mmol/L — ABNORMAL LOW (ref 135–145)
Total Bilirubin: 2 mg/dL — ABNORMAL HIGH (ref 0.3–1.2)
Total Protein: 7.4 g/dL (ref 6.5–8.1)

## 2021-08-12 LAB — CBC WITH DIFFERENTIAL/PLATELET
Abs Immature Granulocytes: 0 10*3/uL (ref 0.00–0.07)
Basophils Absolute: 0 10*3/uL (ref 0.0–0.1)
Basophils Relative: 2 %
Eosinophils Absolute: 0 10*3/uL (ref 0.0–0.5)
Eosinophils Relative: 2 %
HCT: 45 % (ref 39.0–52.0)
Hemoglobin: 15.7 g/dL (ref 13.0–17.0)
Immature Granulocytes: 0 %
Lymphocytes Relative: 90 %
Lymphs Abs: 0.6 10*3/uL — ABNORMAL LOW (ref 0.7–4.0)
MCH: 32 pg (ref 26.0–34.0)
MCHC: 34.9 g/dL (ref 30.0–36.0)
MCV: 91.6 fL (ref 80.0–100.0)
Monocytes Absolute: 0 10*3/uL — ABNORMAL LOW (ref 0.1–1.0)
Monocytes Relative: 3 %
Neutro Abs: 0 10*3/uL — CL (ref 1.7–7.7)
Neutrophils Relative %: 3 %
Platelets: 46 10*3/uL — ABNORMAL LOW (ref 150–400)
RBC: 4.91 MIL/uL (ref 4.22–5.81)
RDW: 12.9 % (ref 11.5–15.5)
Smear Review: NORMAL
WBC: 0.7 10*3/uL — CL (ref 4.0–10.5)
nRBC: 0 % (ref 0.0–0.2)

## 2021-08-12 LAB — PROTIME-INR
INR: 1 (ref 0.8–1.2)
Prothrombin Time: 13.2 seconds (ref 11.4–15.2)

## 2021-08-12 LAB — LACTIC ACID, PLASMA
Lactic Acid, Venous: 3.2 mmol/L (ref 0.5–1.9)
Lactic Acid, Venous: 1.8 mmol/L (ref 0.5–1.9)

## 2021-08-12 MED ORDER — VANCOMYCIN HCL 1750 MG/350ML IV SOLN
1750.0000 mg | INTRAVENOUS | Status: DC
  Administered 2021-08-13: 1750 mg via INTRAVENOUS
  Filled 2021-08-12: qty 350

## 2021-08-12 MED ORDER — IPRATROPIUM-ALBUTEROL 0.5-2.5 (3) MG/3ML IN SOLN
3.0000 mL | Freq: Once | RESPIRATORY_TRACT | Status: AC
  Administered 2021-08-12: 3 mL via RESPIRATORY_TRACT
  Filled 2021-08-12: qty 3

## 2021-08-12 MED ORDER — SODIUM CHLORIDE 0.9 % IV SOLN
2.0000 g | Freq: Once | INTRAVENOUS | Status: AC
  Administered 2021-08-12: 2 g via INTRAVENOUS
  Filled 2021-08-12: qty 12.5

## 2021-08-12 MED ORDER — SODIUM CHLORIDE 0.9 % IV BOLUS
1000.0000 mL | Freq: Once | INTRAVENOUS | Status: AC
  Administered 2021-08-12: 1000 mL via INTRAVENOUS

## 2021-08-12 MED ORDER — LACTATED RINGERS IV BOLUS (SEPSIS): 1000.0000 mL | Freq: Once | INTRAVENOUS | Status: DC

## 2021-08-12 MED ORDER — ARFORMOTEROL TARTRATE 15 MCG/2ML IN NEBU
15.0000 ug | INHALATION_SOLUTION | Freq: Two times a day (BID) | RESPIRATORY_TRACT | Status: DC
  Administered 2021-08-13 – 2021-08-15 (×5): 15 ug via RESPIRATORY_TRACT
  Filled 2021-08-12 (×6): qty 2

## 2021-08-12 MED ORDER — ACETAMINOPHEN 650 MG RE SUPP: 650.0000 mg | Freq: Four times a day (QID) | RECTAL | Status: DC | PRN

## 2021-08-12 MED ORDER — BUDESONIDE 0.25 MG/2ML IN SUSP
0.2500 mg | Freq: Two times a day (BID) | RESPIRATORY_TRACT | Status: DC
Start: 2021-08-12 — End: 2021-08-15
  Administered 2021-08-13 – 2021-08-15 (×5): 0.25 mg via RESPIRATORY_TRACT
  Filled 2021-08-12 (×5): qty 2

## 2021-08-12 MED ORDER — MAGIC MOUTHWASH W/LIDOCAINE
5.0000 mL | Freq: Four times a day (QID) | ORAL | Status: DC | PRN
  Administered 2021-08-13 – 2021-08-15 (×4): 5 mL via ORAL
  Filled 2021-08-12 (×5): qty 5

## 2021-08-12 MED ORDER — VANCOMYCIN HCL IN DEXTROSE 1-5 GM/200ML-% IV SOLN
1000.0000 mg | Freq: Once | INTRAVENOUS | Status: AC
  Administered 2021-08-12: 1000 mg via INTRAVENOUS
  Filled 2021-08-12: qty 200

## 2021-08-12 MED ORDER — ONDANSETRON HCL 4 MG PO TABS
4.0000 mg | ORAL_TABLET | Freq: Four times a day (QID) | ORAL | Status: DC | PRN
  Administered 2021-08-13: 4 mg via ORAL
  Filled 2021-08-12: qty 1

## 2021-08-12 MED ORDER — ACETAMINOPHEN 325 MG PO TABS
650.0000 mg | ORAL_TABLET | Freq: Once | ORAL | Status: AC
  Administered 2021-08-12: 650 mg via ORAL
  Filled 2021-08-12: qty 2

## 2021-08-12 MED ORDER — ALBUTEROL SULFATE (2.5 MG/3ML) 0.083% IN NEBU: 2.5000 mg | INHALATION_SOLUTION | Freq: Four times a day (QID) | RESPIRATORY_TRACT | Status: DC | PRN

## 2021-08-12 MED ORDER — OXYCODONE HCL 5 MG PO TABS
5.0000 mg | ORAL_TABLET | ORAL | Status: DC | PRN
  Administered 2021-08-13 – 2021-08-15 (×4): 5 mg via ORAL
  Filled 2021-08-12 (×4): qty 1

## 2021-08-12 MED ORDER — DEXAMETHASONE 4 MG PO TABS
4.0000 mg | ORAL_TABLET | Freq: Every day | ORAL | Status: DC
  Administered 2021-08-13 – 2021-08-15 (×3): 4 mg via ORAL
  Filled 2021-08-12 (×3): qty 1

## 2021-08-12 MED ORDER — LACTATED RINGERS IV SOLN: INTRAVENOUS | Status: AC

## 2021-08-12 MED ORDER — VANCOMYCIN HCL IN DEXTROSE 1-5 GM/200ML-% IV SOLN: 1000.0000 mg | Freq: Once | INTRAVENOUS | Status: DC

## 2021-08-12 MED ORDER — ACETAMINOPHEN 325 MG PO TABS
650.0000 mg | ORAL_TABLET | Freq: Four times a day (QID) | ORAL | Status: DC | PRN
  Administered 2021-08-13 – 2021-08-15 (×6): 650 mg via ORAL
  Filled 2021-08-12 (×6): qty 2

## 2021-08-12 MED ORDER — SODIUM CHLORIDE 0.9 % IV SOLN
2.0000 g | Freq: Three times a day (TID) | INTRAVENOUS | Status: DC
  Administered 2021-08-13 – 2021-08-15 (×8): 2 g via INTRAVENOUS
  Filled 2021-08-12 (×11): qty 12.5

## 2021-08-12 MED ORDER — PANTOPRAZOLE SODIUM 20 MG PO TBEC
20.0000 mg | DELAYED_RELEASE_TABLET | Freq: Every day | ORAL | Status: DC
  Administered 2021-08-13 – 2021-08-15 (×3): 20 mg via ORAL
  Filled 2021-08-12 (×3): qty 1

## 2021-08-12 MED ORDER — ONDANSETRON HCL 4 MG/2ML IJ SOLN
4.0000 mg | Freq: Four times a day (QID) | INTRAMUSCULAR | Status: DC | PRN
  Administered 2021-08-12: 4 mg via INTRAVENOUS
  Filled 2021-08-12: qty 2

## 2021-08-12 MED ORDER — UMECLIDINIUM BROMIDE 62.5 MCG/ACT IN AEPB
1.0000 | INHALATION_SPRAY | Freq: Every day | RESPIRATORY_TRACT | Status: DC
  Administered 2021-08-13 – 2021-08-15 (×3): 1 via RESPIRATORY_TRACT
  Filled 2021-08-12: qty 7

## 2021-08-12 MED ORDER — LACTATED RINGERS IV SOLN: INTRAVENOUS | Status: DC

## 2021-08-12 MED ORDER — SODIUM CHLORIDE 0.9 % IV SOLN: INTRAVENOUS | Status: DC | PRN

## 2021-08-12 MED ORDER — TRAZODONE HCL 50 MG PO TABS: 50.0000 mg | ORAL_TABLET | Freq: Every evening | ORAL | Status: DC | PRN

## 2021-08-12 MED ORDER — SODIUM CHLORIDE 0.9 % IV SOLN: 2.0000 g | Freq: Once | INTRAVENOUS | Status: DC

## 2021-08-12 MED ORDER — SODIUM CHLORIDE 0.9% FLUSH
10.0000 mL | Freq: Once | INTRAVENOUS | Status: AC
  Administered 2021-08-12: 10 mL via INTRAVENOUS
  Filled 2021-08-12: qty 10

## 2021-08-12 MED ORDER — METHYLPREDNISOLONE SODIUM SUCC 125 MG IJ SOLR
125.0000 mg | Freq: Once | INTRAMUSCULAR | Status: AC
  Administered 2021-08-12: 125 mg via INTRAVENOUS
  Filled 2021-08-12: qty 2

## 2021-08-12 NOTE — Assessment & Plan Note (Addendum)
As evidenced by fever with a Tmax of 101, tachycardia, tachypnea, lactic acidosis, neutropenia and imaging suggestive of right middle lobe pneumonia. Continue aggressive IV fluid resuscitation Place patient on empiric antibiotic therapy with vancomycin and cefepime Follow-up results of blood cultures Trend lactic acid levels

## 2021-08-12 NOTE — ED Triage Notes (Addendum)
Pt, from [place], c/o increasing SOB, cough, and potential fever x1 day.  Denies pain.  Last chemo x1 week ago.  Pt was recently diagnosed w/ thrush and started medication yesterday.     Pt had basic labs completed at [facility] around 11a.

## 2021-08-12 NOTE — Assessment & Plan Note (Signed)
Continue as needed bronchodilator therapy as well as inhaled steroids

## 2021-08-12 NOTE — Sepsis Progress Note (Signed)
Notified bedside nurse of need to administer antibiotics and fluid bolus.  

## 2021-08-12 NOTE — ED Provider Notes (Signed)
[facility] Provider Note    Event Date/Time   First MD Initiated Contact with Patient 08/12/21 1459     (approximate)   History   Shortness of Breath and Weakness   HPI  Maurice Simpson is a 66 y.o. 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 Highest fever was 102 at home. Pt has had worsening weakness, fatigue, lethargy, as well as cough with SOB. Occasional sputum production. Wears 2L at home normally but has been satting lower than usual in 88-89%. No known sick contacts. Was sent in from Oncology clinic today. No CP. No abd pain, n/v/d. Has been using Albuterol at home w/o significant relief.       Physical Exam   Triage Vital Signs: ED Triage Vitals  Enc Vitals Group     BP 08/12/21 1315 130/82     Pulse Rate 08/12/21 1315 (!) 102     Resp 08/12/21 1315 18     Temp 08/12/21 1315 98.2 F (36.8 C)     Temp Source 08/12/21 1315 Oral     SpO2 08/12/21 1315 95 %     Weight 08/12/21 1244 211 lb (95.7 kg)     Height 08/12/21 1244 5\' 8"  (1.727 m)     Head Circumference --      Peak Flow --      Pain Score 08/12/21 1244 0     Pain Loc --      Pain Edu? --      Excl. in [place]? --     Most recent vital signs: Vitals:   08/12/21 1315  BP: 130/82  Pulse: (!) 102  Resp: 18  Temp: 98.2 F (36.8 C)  SpO2: 95%     General: Awake, mild distress. CV:  Good peripheral perfusion. Tachycardic, no m/r/g. Resp:  Moderate tachypnea, diffuse wheezing, R basilar rales. Abd:  No distention. No tenderness. Other:  Dry MM. Chronically ill-appearing.   ED Results / Procedures / Treatments   Labs (all labs ordered are listed, but only abnormal results are displayed) Labs Reviewed  LACTIC ACID, PLASMA - Abnormal; Notable for the following components:      Result Value   Lactic Acid, Venous 3.2 (*)    All other components within normal limits  CULTURE,  BLOOD (ROUTINE X 2)  CULTURE, BLOOD (ROUTINE X 2)  PROTIME-INR  LACTIC ACID, PLASMA  URINALYSIS, ROUTINE W REFLEX MICROSCOPIC     EKG Normal sinus rhythm, VR 100. PR 142, QRS 70, QTc 399. No acute ST elevations or depressions.   RADIOLOGY CXR: R middle lobe pneumonia    I also independently reviewed and agree with radiologist interpretations.   PROCEDURES:  Critical Care performed: Yes, see critical care procedure note(s)  .Critical Care  Performed by: Duffy Bruce, MD Authorized by: Duffy Bruce, MD   Critical care provider statement:    Critical care time (minutes):  30   Critical care time was exclusive of:  Separately billable procedures and treating other patients   Critical care was necessary to treat or prevent imminent or life-threatening deterioration of the following conditions:  Cardiac failure, circulatory failure and respiratory failure   Critical care was time spent personally by me on the following activities:  Development of treatment plan with patient or surrogate, discussions with consultants, evaluation of patient's response to treatment, examination of patient, ordering and review of laboratory  studies, ordering and review of radiographic studies, ordering and performing treatments and interventions, pulse oximetry, re-evaluation of patient's condition and review of [facility]: Medications  lactated ringers infusion (has no administration in time range)  vancomycin (VANCOCIN) IVPB 1000 mg/200 mL premix (has no administration in time range)  ceFEPIme (MAXIPIME) 2 g in sodium chloride 0.9 % 100 mL IVPB (has no administration in time range)  acetaminophen (TYLENOL) tablet 650 mg (has no administration in time range)  ipratropium-albuterol (DUONEB) 0.5-2.5 (3) MG/3ML nebulizer solution 3 mL (has no administration in time range)  ipratropium-albuterol (DUONEB) 0.5-2.5 (3) MG/3ML nebulizer solution 3 mL (has no  administration in time range)  ipratropium-albuterol (DUONEB) 0.5-2.5 (3) MG/3ML nebulizer solution 3 mL (has no administration in time range)  methylPREDNISolone sodium succinate (SOLU-MEDROL) 125 mg/2 mL injection 125 mg (has no administration in time range)  sodium chloride 0.9 % bolus 1,000 mL (has no administration in time range)  sodium chloride 0.9 % bolus 1,000 mL (has no administration in time range)     IMPRESSION / MDM / ASSESSMENT AND PLAN / ED COURSE  I reviewed the triage vital signs and the nursing notes.                               The patient is on the cardiac monitor to evaluate for evidence of arrhythmia and/or significant heart rate changes.   Ddx:  Differential includes the following, with pertinent life- or limb-threatening emergencies considered:  Sepsis with neutropenic fever, suspected pneumonia, COPD exacerbation, bronchitis, viral illness, UTI or other occult infection, chemotherapy effect  Patient's presentation is most consistent with acute presentation with potential threat to life or bodily function.  MDM:  66 year old male with history of small cell lung cancer on chemotherapy, here with fever in the setting of neutropenia.  Suspect neutropenic fever due to right-sided pneumonia based on chest x-ray.  Patient is tachycardic but not hypotensive.  He is satting 95 on his baseline 2 L but does appear mildly dyspneic.  Patient started on empiric IV antibiotics, broad-spectrum including vancomycin and cefepime, as well as IV fluid resuscitation with normal saline.  Lactic acid over 3, though some of this could be related to his liver/omental mets and secondary lactic acidosis.  CBC from clinic shows white count 0.7 with absolute neutropenia.  CMP shows hyponatremia, mild ALT elevation.   MEDICATIONS GIVEN IN ED: Medications  lactated ringers infusion (has no administration in time range)  vancomycin (VANCOCIN) IVPB 1000 mg/200 mL premix (has no  administration in time range)  ceFEPIme (MAXIPIME) 2 g in sodium chloride 0.9 % 100 mL IVPB (has no administration in time range)  acetaminophen (TYLENOL) tablet 650 mg (has no administration in time range)  ipratropium-albuterol (DUONEB) 0.5-2.5 (3) MG/3ML nebulizer solution 3 mL (has no administration in time range)  ipratropium-albuterol (DUONEB) 0.5-2.5 (3) MG/3ML nebulizer solution 3 mL (has no administration in time range)  ipratropium-albuterol (DUONEB) 0.5-2.5 (3) MG/3ML nebulizer solution 3 mL (has no administration in time range)  methylPREDNISolone sodium succinate (SOLU-MEDROL) 125 mg/2 mL injection 125 mg (has no administration in time range)  sodium chloride 0.9 % bolus 1,000 mL (has no administration in time range)  sodium chloride 0.9 % bolus 1,000 mL (has no administration in time range)     Consults:  Hospitalist   EMR reviewed  Prior Oncology visit notes including visit with [employer] today, labs  from clinic today reviewed as above     FINAL CLINICAL IMPRESSION(S) / ED DIAGNOSES   Final diagnoses:  Neutropenic fever ([place])  Pneumonia of right lower lobe due to infectious organism     Rx / DC Orders   ED Discharge Orders     None        Note:  This document was prepared using Dragon voice recognition software and may include unintentional dictation errors.   Duffy Bruce, MD 08/12/21 1538

## 2021-08-12 NOTE — Progress Notes (Signed)
PHARMACY -  BRIEF ANTIBIOTIC NOTE   Pharmacy has received consults for cefepime and vancomycin from an ED provider.  The patient's profile has been reviewed for ht/wt/allergies/indication/available labs.    One time order(s) placed for cefepime 2 grams x 1 and vancomycin 1,000 mg x 1  Further antibiotics/pharmacy consults should be ordered by admitting physician if indicated.                       Thank you,  Glean Salvo, PharmD Clinical Pharmacist  08/12/2021 3:05 PM

## 2021-08-12 NOTE — Sepsis Progress Note (Signed)
Code sepsis protocol being monitored by eLink. 

## 2021-08-12 NOTE — Progress Notes (Signed)
Symptom Management and [place] at [facility] [phone] [phone]  Patient Care Team: [name], NP as PCP - General (Nurse Practitioner) Telford Nab, RN as Oncology Nurse Navigator   NAME OF PATIENT: Maurice Simpson  [id number]  [birth year]   DATE OF VISIT: 08/12/21  REASON FOR CONSULT: Maurice Simpson is a 66 y.o. 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  Cancer was diagnosed in June 2023.  Patient is on treatment with carbo etoposide Tecentriq chemotherapy.  Patient is status post whole brain radiation.  INTERVAL HISTORY:  Patient was last seen in clinic on 08/07/2021 with hypoxia and shortness of breath.  CTA on 08/07/2021 showed no evidence of PE but large right infrahilar mass with postobstructive atelectasis.  Home O2 was ordered.  Patient presents to clinic today for follow-up reportedly has 24 hours of intermittent fevers, progressive lethargy, poor oral intake, cough, shortness of breath, and respiratory congestion.  SOCIAL HISTORY:     reports that he has been smoking cigarettes. He has a 102.00 pack-year smoking history. He has never used smokeless tobacco. He reports that he does not currently use alcohol. He reports that he does not use drugs.  He lives at home with his wife  ADVANCE DIRECTIVES:    CODE STATUS: Full code   PAST MEDICAL HISTORY: Past Medical History:  Diagnosis Date   Cancer ([place])    COPD (chronic obstructive pulmonary disease) ([place])    Dyspnea     PAST SURGICAL HISTORY:  Past Surgical History:  Procedure Laterality Date   CATARACT EXTRACTION Right 03/10/2021   IR IMAGING GUIDED PORT INSERTION  07/27/2021    HEMATOLOGY/ONCOLOGY HISTORY:  Oncology History  Small cell lung cancer ([place])  07/21/2021 Initial Diagnosis   Small cell lung cancer ([place])   07/21/2021 Cancer Staging    Staging form: Lung, AJCC 8th Edition - Clinical stage from 07/21/2021: Stage IVB (cT2, cN0, pM1c) - Signed by Sindy Guadeloupe, MD on 07/21/2021   08/03/2021 -  Chemotherapy   Patient is on Treatment Plan : LUNG SCLC Carboplatin + Etoposide + Atezolizumab Induction q21d / Atezolizumab Maintenance q21d       ALLERGIES:  is allergic to bee venom.  MEDICATIONS:  Current Outpatient Medications  Medication Sig Dispense Refill   albuterol (PROVENTIL) (2.5 MG/3ML) 0.083% nebulizer solution Take 3 mLs (2.5 mg total) by nebulization every 6 (six) hours as needed for wheezing or shortness of breath. 75 mL 12   budesonide (PULMICORT) 0.25 MG/2ML nebulizer solution Take 2 mLs (0.25 mg total) by nebulization 2 (two) times daily as needed. 60 mL 12   dexamethasone (DECADRON) 4 MG tablet Take 1 tablet by mouth twice a day for 7 days, then take 1 tablet by mouth daily for 7 days, then take 0.5 tablet by mouth daily for 7 days, then stop 30 tablet 0   EPINEPHrine 0.3 mg/0.3 mL IJ SOAJ injection Inject 0.3 mg into the muscle as needed for anaphylaxis. 1 each 4   Glycopyrrolate-Formoterol (BEVESPI AEROSPHERE) 9-4.8 MCG/ACT AERO Inhale 2 puffs into the lungs daily. 10.7 g 12   lidocaine-prilocaine (EMLA) cream Apply to affected area once 30 g 3   loratadine (CLARITIN) 10 MG tablet Take 1 tablet (10 mg total) by mouth daily. 30 tablet 11   LORazepam (ATIVAN) 0.5 MG tablet Take 1 tablet (0.5 mg total) by mouth every 6 (six) hours as needed (Nausea  or vomiting). 30 tablet 0   magic mouthwash w/lidocaine SOLN Take 5 mLs by mouth 4 (four) times daily. 480 mL 0   ondansetron (ZOFRAN) 4 MG tablet Take 1 tablet (4 mg total) by mouth every 8 (eight) hours as needed for nausea or vomiting. 60 tablet 3   oxyCODONE (OXY IR/ROXICODONE) 5 MG immediate release tablet Take 1 tablet (5 mg total) by mouth every 4 (four) hours as needed for severe pain. 60 tablet 0   pantoprazole (PROTONIX) 20 MG tablet TAKE 1 TABLET BY MOUTH EVERY  DAY 90 tablet 0   prochlorperazine (COMPAZINE) 10 MG tablet Take 1 tablet (10 mg total) by mouth every 6 (six) hours as needed for nausea or vomiting. 60 tablet 2   traZODone (DESYREL) 50 MG tablet Take 1 tablet (50 mg total) by mouth at bedtime as needed for sleep. 30 tablet 2   No current facility-administered medications for this visit.    VITAL SIGNS: There were no vitals taken for this visit. There were no vitals filed for this visit.  Estimated body mass index is 32.2 kg/m as calculated from the following:   Height as of 08/03/21: 5\' 8"  (1.727 m).   Weight as of 08/03/21: 211 lb 12.8 oz (96.1 kg).  LABS: CBC:    Component Value Date/Time   WBC 7.7 08/07/2021 1501   HGB 16.5 08/07/2021 1501   HGB 18.0 (H) 04/30/2021 1136   HCT 47.4 08/07/2021 1501   HCT 53.3 (H) 04/30/2021 1136   PLT 134 (L) 08/07/2021 1501   PLT 156 04/30/2021 1136   MCV 91.3 08/07/2021 1501   MCV 91 04/30/2021 1136   MCV 94 01/21/2011 0134   NEUTROABS 6.8 08/07/2021 1501   NEUTROABS 4.8 04/30/2021 1136   LYMPHSABS 0.8 08/07/2021 1501   LYMPHSABS 2.9 04/30/2021 1136   MONOABS 0.0 (L) 08/07/2021 1501   EOSABS 0.0 08/07/2021 1501   EOSABS 0.3 04/30/2021 1136   BASOSABS 0.0 08/07/2021 1501   BASOSABS 0.1 04/30/2021 1136   Comprehensive Metabolic Panel:    Component Value Date/Time   NA 135 08/07/2021 1501   NA 136 04/30/2021 1136   NA 139 01/21/2011 0134   K 4.8 08/07/2021 1501   K 3.8 01/21/2011 0134   CL 98 08/07/2021 1501   CL 105 01/21/2011 0134   CO2 26 08/07/2021 1501   CO2 23 01/21/2011 0134   BUN 23 08/07/2021 1501   BUN 12 04/30/2021 1136   BUN 15 01/21/2011 0134   CREATININE 0.87 08/07/2021 1501   CREATININE 0.92 01/21/2011 0134   GLUCOSE 171 (H) 08/07/2021 1501   GLUCOSE 118 (H) 01/21/2011 0134   CALCIUM 9.3 08/07/2021 1501   CALCIUM 9.1 01/21/2011 0134   AST 82 (H) 08/07/2021 1501   AST 20 01/21/2011 0134   ALT 92 (H) 08/07/2021 1501   ALT 29 01/21/2011 0134   ALKPHOS 62  08/07/2021 1501   ALKPHOS 46 (L) 01/21/2011 0134   BILITOT 1.8 (H) 08/07/2021 1501   BILITOT 0.8 04/30/2021 1136   BILITOT 0.8 01/21/2011 0134   PROT 7.2 08/07/2021 1501   PROT 7.2 04/30/2021 1136   PROT 7.4 01/21/2011 0134   ALBUMIN 3.6 08/07/2021 1501   ALBUMIN 4.3 04/30/2021 1136   ALBUMIN 3.8 01/21/2011 0134    RADIOGRAPHIC STUDIES: CT Angio Chest Pulmonary Embolism (PE) W or WO Contrast  Result Date: 08/07/2021 CLINICAL DATA:  Chest pain. EXAM: CT ANGIOGRAPHY CHEST WITH CONTRAST TECHNIQUE: Multidetector CT imaging of the chest was performed using the  standard protocol during bolus administration of intravenous contrast. Multiplanar CT image reconstructions and MIPs were obtained to evaluate the vascular anatomy. RADIATION DOSE REDUCTION: This exam was performed according to the departmental dose-optimization program which includes automated exposure control, adjustment of the mA and/or kV according to patient size and/or use of iterative reconstruction technique. CONTRAST:  62mL OMNIPAQUE IOHEXOL 350 MG/ML SOLN COMPARISON:  May 29, 2021. FINDINGS: Cardiovascular: Satisfactory opacification of the pulmonary arteries to the segmental level. No evidence of pulmonary embolism. Normal heart size. No pericardial effusion. Mediastinum/Nodes: No enlarged mediastinal, hilar, or axillary lymph nodes. Thyroid gland, trachea, and esophagus demonstrate no significant findings. Lungs/Pleura: No pneumothorax or pleural effusion is noted. 5.5 x 3.8 cm right infrahilar mass is again noted consistent with malignancy. Mild postobstructive atelectasis is noted. Upper Abdomen: Bilateral adrenal masses are noted concerning for metastatic disease. Musculoskeletal: No chest wall abnormality. No acute or significant osseous findings. Review of the MIP images confirms the above findings. IMPRESSION: No definite evidence of pulmonary embolus. 5.5 x 3.8 cm right infrahilar mass is noted consistent with malignancy with  associated postobstructive atelectasis. Bilateral adrenal masses are noted concerning for metastatic disease. Electronically Signed   By: Marijo Conception M.D.   On: 08/07/2021 16:17   IR IMAGING GUIDED PORT INSERTION  Result Date: 07/27/2021 CLINICAL DATA:  Metastatic small cell lung carcinoma and need for porta cath for chemotherapy. EXAM: IMPLANTED PORT A CATH PLACEMENT WITH ULTRASOUND AND FLUOROSCOPIC GUIDANCE ANESTHESIA/SEDATION: Moderate (conscious) sedation was employed during this procedure. A total of Versed 2.0 mg and Fentanyl 100 mcg was administered intravenously by radiology nursing. Moderate Sedation Time: 20 minutes. The patient's level of consciousness and vital signs were monitored continuously by radiology nursing throughout the procedure under my direct supervision. FLUOROSCOPY: 36 seconds.  11.4 mGy. PROCEDURE: The procedure, risks, benefits, and alternatives were explained to the patient. Questions regarding the procedure were encouraged and answered. The patient understands and consents to the procedure. A time-out was performed prior to initiating the procedure. Ultrasound was utilized to confirm patency of the right internal jugular vein. A permanent ultrasound image was recorded. The right neck and chest were prepped with chlorhexidine in a sterile fashion, and a sterile drape was applied covering the operative field. Maximum barrier sterile technique with sterile gowns and gloves were used for the procedure. Local anesthesia was provided with 1% lidocaine. After creating a small venotomy incision, a 21 gauge needle was advanced into the right internal jugular vein under direct, real-time ultrasound guidance. Ultrasound image documentation was performed. After securing guidewire access, an 8 Fr dilator was placed. A J-wire was kinked to measure appropriate catheter length. A subcutaneous port pocket was then created along the upper chest wall utilizing sharp and blunt dissection. Portable  cautery was utilized. The pocket was irrigated with sterile saline. A single lumen power injectable port was chosen for placement. The 8 Fr catheter was tunneled from the port pocket site to the venotomy incision. The port was placed in the pocket. External catheter was trimmed to appropriate length based on guidewire measurement. At the venotomy, an 8 Fr peel-away sheath was placed over a guidewire. The catheter was then placed through the sheath and the sheath removed. Final catheter positioning was confirmed and documented with a fluoroscopic spot image. The port was accessed with a needle and aspirated and flushed with heparinized saline. The access needle was removed. The venotomy and port pocket incisions were closed with subcutaneous 3-0 Monocryl and subcuticular 4-0 Vicryl. Dermabond was  applied to both incisions. COMPLICATIONS: COMPLICATIONS None FINDINGS: After catheter placement, the tip lies at the cavo-atrial junction. The catheter aspirates normally and is ready for immediate use. IMPRESSION: Placement of single lumen port a cath via right internal jugular vein. The catheter tip lies at the cavo-atrial junction. A power injectable port a cath was placed and is ready for immediate use. Electronically Signed   By: Aletta Edouard M.D.   On: 07/27/2021 12:58    PERFORMANCE STATUS (ECOG) : 3 - Symptomatic, >50% confined to bed  Review of Systems Unless otherwise noted, a complete review of systems is negative.  Physical Exam General: Ill-appearing Cardiovascular: regular rate and rhythm Pulmonary: Coarse anterior/posterior fields Abdomen: soft, nontender, + bowel sounds GU: no suprapubic tenderness Extremities: no edema, no joint deformities Skin: no rashes Neurological: Weakness but otherwise nonfocal  IMPRESSION: Patient accompanied by his wife.  Patient ill-appearing and falling asleep during my exam.  He was febrile upon presentation to clinic (101.2) and notably neutropenic despite  receiving Neulasta.  Recent CTA ruled out PE but had postobstructive atelectasis.  I question if this is progressed into pneumonia.  Discussed options with patient/wife as well as case and plan reviewed with Dr. Janese Banks.  Patient will be transported to the ER for further evaluation and management.  He likely will require admission for management of neutropenic fever. He meets SIRS/Sepsis criteria based on fever, tachycardia, and suspected source of infection.  Discussed CODE STATUS with wife.  She states that she wants him to remain a full code for now.  Report called to ER triage nurse  PLAN: -Transfer to ED for further evaluation and management  Case and plan discussed with Dr. Janese Banks   Patient expressed understanding and was in agreement with this plan. He also understands that He can call clinic at any time with any questions, concerns, or complaints.   Thank you for allowing me to participate in the care of this very pleasant patient.   Time Total: 25 minutes  Visit consisted of counseling and education dealing with the complex and emotionally intense issues of symptom management in the setting of serious illness.Greater than 50%  of this time was spent counseling and coordinating care related to the above assessment and plan.  Signed by: Altha Harm, PhD, NP-C

## 2021-08-12 NOTE — Assessment & Plan Note (Signed)
Patient with a history of metastatic small cell lung cancer who completed his first cycle of chemotherapy and presents for evaluation of fever. Noted to be neutropenic with a white count of 0.7 and ANC of 210 Patient received Neulasta as an outpatient Continue empiric antibiotic therapy with vancomycin and cefepime Consult oncology Follow-up results of blood cultures

## 2021-08-12 NOTE — Progress Notes (Signed)
CODE SEPSIS - PHARMACY COMMUNICATION  **Broad Spectrum Antibiotics should be administered within 1 hour of Sepsis diagnosis**  Time Code Sepsis Called/Page Received: 1513  Antibiotics Ordered: cefepime 2 grams x 1, vancomycin 1,000 mg x 1  Time of 1st antibiotic administration: 1621  Additional action taken by pharmacy: messaged   If necessary, Name of Provider/Nurse Contacted: RN    Wynelle Cleveland ,PharmD Clinical Pharmacist  08/12/2021  3:05 PM

## 2021-08-12 NOTE — Progress Notes (Signed)
Fevers started yesterday evening. Pt reports shortness of breath with exertion. Per wife, unable to get patient in car with out a 2 person assist today.

## 2021-08-12 NOTE — H&P (Signed)
History and Physical    Patient: Maurice Simpson RFF:[id number] DOB: 04-Jul-1955 DOA: 08/12/2021 DOS: the patient was seen and examined on 08/12/2021 PCP: [name], NP  Patient coming from: Home  Chief Complaint:  Chief Complaint  Patient presents with   Shortness of Breath   Weakness   HPI: Maurice Simpson is a 66 y.o. 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 Patient's cancer was diagnosed in June, 2023 and he just completed his first cycle of chemotherapy 1 week prior to his admission. He denies having any nausea, no vomiting, no abdominal pain, no changes in his bowel habits, no blurred vision or focal deficit. Labs show lactic acidosis, neutropenia and thrombocytopenia. Imaging is suggestive of possible right middle lobe pneumonia. He will be admitted to the hospital for further evaluation.  Review of Systems: As mentioned in the history of present illness. All other systems reviewed and are negative. Past Medical History:  Diagnosis Date   Cancer [facility])    COPD (chronic obstructive pulmonary disease) ([place])    Dyspnea    Past Surgical History:  Procedure Laterality Date   CATARACT EXTRACTION Right 03/10/2021   IR IMAGING GUIDED PORT INSERTION  07/27/2021   Social History:  reports that he has been smoking cigarettes. He has a 102.00 pack-year smoking history. He has never used smokeless tobacco. He reports that he does not currently use alcohol. He reports that he does not use drugs.  Allergies  Allergen Reactions   Bee Venom     Family History  Problem Relation Age of  Onset   Diabetes Mother    Hypertension Mother    Diabetes Father    Hypertension Father    Diabetes Sister    Cancer - Colon Cousin     Prior to Admission medications   Medication Sig Start Date End Date Taking? Authorizing Provider  albuterol (PROVENTIL) (2.5 MG/3ML) 0.083% nebulizer solution Take 3 mLs (2.5 mg total) by nebulization every 6 (six) hours as needed for wheezing or shortness of breath. 08/03/21   Sindy Guadeloupe, MD  budesonide (PULMICORT) 0.25 MG/2ML nebulizer solution Take 2 mLs (0.25 mg total) by nebulization 2 (two) times daily as needed. 08/03/21   Sindy Guadeloupe, MD  dexamethasone (DECADRON) 4 MG tablet Take 1 tablet by mouth twice a day for 7 days, then take 1 tablet by mouth daily for 7 days, then take 0.5 tablet by mouth daily for 7 days, then stop 07/20/21   Sindy Guadeloupe, MD  EPINEPHrine 0.3 mg/0.3 mL IJ SOAJ injection Inject 0.3 mg into the muscle as needed for anaphylaxis. Patient not taking: Reported on 08/12/2021 05/12/21   [name], NP  Glycopyrrolate-Formoterol (BEVESPI AEROSPHERE) 9-4.8 MCG/ACT AERO Inhale 2 puffs into the lungs daily. 05/12/21   [name], NP  lidocaine-prilocaine (EMLA) cream Apply to affected area once 07/21/21   Sindy Guadeloupe, MD  loratadine (CLARITIN) 10 MG tablet Take 1 tablet (10 mg total) by mouth daily. 06/24/21   [name], [name], NP  LORazepam (ATIVAN) 0.5 MG tablet Take 1 tablet (0.5 mg total) by mouth every 6 (six) hours as  needed (Nausea or vomiting). Patient not taking: Reported on 08/12/2021 07/21/21   Sindy Guadeloupe, MD  magic mouthwash w/lidocaine SOLN Take 5 mLs by mouth 4 (four) times daily. 08/10/21   Sindy Guadeloupe, MD  ondansetron (ZOFRAN) 4 MG tablet Take 1 tablet (4 mg total) by mouth every 8 (eight) hours as needed for nausea or vomiting. 06/24/21   [name], [name], NP  oxyCODONE (OXY IR/ROXICODONE) 5 MG immediate release tablet Take 1 tablet (5 mg total) by mouth every 4 (four) hours as needed for severe pain.  07/01/21   Sindy Guadeloupe, MD  pantoprazole (PROTONIX) 20 MG tablet TAKE 1 TABLET BY MOUTH EVERY DAY 08/05/21   Sindy Guadeloupe, MD  prochlorperazine (COMPAZINE) 10 MG tablet Take 1 tablet (10 mg total) by mouth every 6 (six) hours as needed for nausea or vomiting. 07/01/21   Sindy Guadeloupe, MD  traZODone (DESYREL) 50 MG tablet TAKE 1 TABLET BY MOUTH AT BEDTIME AS NEEDED FOR SLEEP. 08/12/21   Sindy Guadeloupe, MD    Physical Exam: Vitals:   08/12/21 1244 08/12/21 1315  BP:  130/82  Pulse:  (!) 102  Resp:  18  Temp:  98.2 F (36.8 C)  TempSrc:  Oral  SpO2:  95%  Weight: 95.7 kg   Height: $Remove'5\' 8"'ljmrpIL$  (1.727 m)    Physical Exam Vitals and nursing note reviewed.  Constitutional:      Appearance: He is well-developed.     Comments: Chronically ill-appearing  HENT:     Head: Normocephalic and atraumatic.     Mouth/Throat:     Mouth: Mucous membranes are moist.  Eyes:     Pupils: Pupils are equal, round, and reactive to light.  Cardiovascular:     Rate and Rhythm: Tachycardia present.  Pulmonary:     Effort: Tachypnea present.     Comments: Diffuse rhonchi, scattered expiratory wheezes Abdominal:     General: Bowel sounds are normal.     Palpations: Abdomen is soft. There is hepatomegaly.  Musculoskeletal:     Cervical back: Normal range of motion and neck supple.     Comments: Left lower extremity swelling  Skin:    General: Skin is warm and dry.     Comments: Petechiae on lower extremities  Neurological:     Mental Status: He is alert.     Motor: Weakness present.  Psychiatric:     Comments: Depressed mood, flat affect     Data Reviewed: Relevant notes from primary care and specialist visits, past discharge summaries as available in EHR, including Care Everywhere. Prior diagnostic testing as pertinent to current admission diagnoses Updated medications and problem lists for reconciliation ED course, including vitals, labs, imaging, treatment and response to treatment Triage  notes, nursing and pharmacy notes and ED provider's notes Notable results as noted in HPI Labs reviewed.  Lactic acid 3.2, PT 13.2, INR 1.0, white count 0.7, hemoglobin 15.7, hematocrit 45, platelet count 46, sodium 133, potassium 4.1, chloride 97, bicarb 26, glucose 172, BUN 19, creatinine 0.84, calcium 9.1, total protein 7.4, albumin 3.8, AST 35, ALT 81, alk phos 77, total bilirubin 2.0 Chest x-ray reviewed by me is concerning for right middle lobe pneumonia Twelve-lead EKG reviewed by me shows normal sinus rhythm There are no new results to review at this time.  Assessment and Plan: * Febrile neutropenia ([place]) Patient with a history of metastatic small cell lung cancer who completed his first cycle of chemotherapy and presents for evaluation of  fever. Noted to be neutropenic with a white count of 0.7 and ANC of 210 Patient received Neulasta as an outpatient Continue empiric antibiotic therapy with vancomycin and cefepime Consult oncology Follow-up results of blood cultures  Sepsis ([place]) As evidenced by fever with a Tmax of 101, tachycardia, tachypnea, lactic acidosis, neutropenia and imaging suggestive of right middle lobe pneumonia. Continue aggressive IV fluid resuscitation Place patient on empiric antibiotic therapy with vancomycin and cefepime Follow-up results of blood cultures Trend lactic acid levels  Postobstructive pneumonia Secondary to known malignancy Treatment as outlined in 1  Small cell lung cancer [facility]) Patient diagnosed in June, 2023 with metastatic small cell lung cancer involving the right lung status post first cycle of his chemotherapy as well as whole brain radiation treatment for metastatic disease to the brain. We will request oncology consult   COPD (chronic obstructive pulmonary disease) ([place]) Continue as needed bronchodilator therapy as well as inhaled steroids  Thrombocytopenia ([place]) Secondary to recent chemotherapy No evidence of bleeding Monitor  closely during hospitalization      Advance Care Planning:   Code Status: Full Code   Consults: Oncology consult, palliative care  Family Communication: Greater than 50% of time was spent discussing patient's condition and plan of care with him and his wife at the bedside.  All questions and concerns have been addressed.  They verbalized understanding and agree with the plan.  CODE STATUS was discussed and patient is a full code  Severity of Illness: The appropriate patient status for this patient is INPATIENT. Inpatient status is judged to be reasonable and necessary in order to provide the required intensity of service to ensure the patient's safety. The patient's presenting symptoms, physical exam findings, and initial radiographic and laboratory data in the context of their chronic comorbidities is felt to place them at high risk for further clinical deterioration. Furthermore, it is not anticipated that the patient will be medically stable for discharge from the hospital within 2 midnights of admission.   * I certify that at the point of admission it is my clinical judgment that the patient will require inpatient hospital care spanning beyond 2 midnights from the point of admission due to high intensity of service, high risk for further deterioration and high frequency of surveillance required.*  Author: Collier Bullock, MD 08/12/2021 5:00 PM  For on call review [email or web address].

## 2021-08-12 NOTE — Assessment & Plan Note (Signed)
Secondary to known malignancy Treatment as outlined in 1

## 2021-08-12 NOTE — Assessment & Plan Note (Signed)
Secondary to recent chemotherapy No evidence of bleeding Monitor closely during hospitalization

## 2021-08-12 NOTE — Progress Notes (Addendum)
Pharmacy Antibiotic Note  Maurice Simpson is a 66 y.o. male admitted on 08/12/2021 with sepsis and concern for febrile neutropenia. Pharmacy has been consulted for vancomycin and cefepime dosing.  TMAX 102F. Cefepime 2 grams x 1 @1623  and vancomycin 1,000 mg x 1 @1621 .  Plan: Vancomycin 1,000 mg to complete 2,000 mg loading dose Vancomycin 1750mg  Q 24 hours ordered as maintenance dosing Goal AUC 400-600 Estimated AUC 495.1, Cmin 8.7 Scr used: 0.84, Vd used: 0.5  Cefepime 2 grams every 8 hours  Monitor renal function and clinical course.   Height: 5\' 8"  (172.7 cm) Weight: 95.7 kg (211 lb) IBW/kg (Calculated) : 68.4  Temp (24hrs), Avg:99.5 F (37.5 C), Min:98.2 F (36.8 C), Max:101.2 F (38.4 C)  Recent Labs  Lab 08/07/21 1501 08/12/21 1041 08/12/21 1300  WBC 7.7 0.7*  --   CREATININE 0.87 0.84  --   LATICACIDVEN  --   --  3.2*    Estimated Creatinine Clearance: 97 mL/min (by C-G formula based on SCr of 0.84 mg/dL).    Allergies  Allergen Reactions   Bee Venom     Antimicrobials this admission: 8/2 vancomycin >>  8/2 cefepime >>   Dose adjustments this admission: N/a  Microbiology results: 8/2 BCx: collected  Thank you for allowing pharmacy to be a part of this patient's care.  Pearla Dubonnet, PharmD Clinical Pharmacist 08/12/2021 9:17 PM

## 2021-08-12 NOTE — Assessment & Plan Note (Addendum)
Patient diagnosed in June, 2023 with metastatic small cell lung cancer involving the right lung status post first cycle of his chemotherapy as well as whole brain radiation treatment for metastatic disease to the brain. We will request oncology consult

## 2021-08-13 ENCOUNTER — Encounter: Payer: Self-pay | Admitting: Oncology

## 2021-08-13 DIAGNOSIS — J189 Pneumonia, unspecified organism: Secondary | ICD-10-CM | POA: Diagnosis not present

## 2021-08-13 DIAGNOSIS — B965 Pseudomonas (aeruginosa) (mallei) (pseudomallei) as the cause of diseases classified elsewhere: Secondary | ICD-10-CM

## 2021-08-13 DIAGNOSIS — R7881 Bacteremia: Secondary | ICD-10-CM

## 2021-08-13 DIAGNOSIS — C3490 Malignant neoplasm of unspecified part of unspecified bronchus or lung: Secondary | ICD-10-CM

## 2021-08-13 DIAGNOSIS — R5081 Fever presenting with conditions classified elsewhere: Secondary | ICD-10-CM | POA: Diagnosis not present

## 2021-08-13 DIAGNOSIS — D709 Neutropenia, unspecified: Secondary | ICD-10-CM | POA: Diagnosis not present

## 2021-08-13 LAB — BLOOD CULTURE ID PANEL (REFLEXED) - BCID2

## 2021-08-13 LAB — BASIC METABOLIC PANEL
Anion gap: 7 (ref 5–15)
BUN: 13 mg/dL (ref 8–23)
CO2: 22 mmol/L (ref 22–32)
Calcium: 8.5 mg/dL — ABNORMAL LOW (ref 8.9–10.3)
Chloride: 104 mmol/L (ref 98–111)
Creatinine, Ser: 0.81 mg/dL (ref 0.61–1.24)
GFR, Estimated: >60 mL/min (ref >60)
Glucose, Bld: 127 mg/dL — ABNORMAL HIGH (ref 70–99)
Potassium: 4 mmol/L (ref 3.5–5.1)
Sodium: 133 mmol/L — ABNORMAL LOW (ref 135–145)

## 2021-08-13 LAB — CBC
HCT: 33.6 % — ABNORMAL LOW (ref 39.0–52.0)
Hemoglobin: 11.8 g/dL — ABNORMAL LOW (ref 13.0–17.0)
MCH: 31.2 pg (ref 26.0–34.0)
MCHC: 35.1 g/dL (ref 30.0–36.0)
MCV: 88.9 fL (ref 80.0–100.0)
Platelets: 38 10*3/uL — ABNORMAL LOW (ref 150–400)
RBC: 3.78 MIL/uL — ABNORMAL LOW (ref 4.22–5.81)
RDW: 12.3 % (ref 11.5–15.5)
WBC: 0.7 10*3/uL — CL (ref 4.0–10.5)
nRBC: 0 % (ref 0.0–0.2)

## 2021-08-13 LAB — HIV ANTIBODY (ROUTINE TESTING W REFLEX): HIV Screen 4th Generation wRfx: NONREACTIVE

## 2021-08-13 LAB — CORTISOL-AM, BLOOD: Cortisol - AM: 4.6 ug/dL — ABNORMAL LOW (ref 6.7–22.6)

## 2021-08-13 LAB — PROTIME-INR
INR: 1.1 (ref 0.8–1.2)
Prothrombin Time: 14.1 seconds (ref 11.4–15.2)

## 2021-08-13 LAB — PROCALCITONIN: Procalcitonin: 0.2 ng/mL

## 2021-08-13 MED ORDER — CHLORHEXIDINE GLUCONATE CLOTH 2 % EX PADS
6.0000 | MEDICATED_PAD | Freq: Every day | CUTANEOUS | Status: DC
  Administered 2021-08-13 – 2021-08-14 (×2): 6 via TOPICAL

## 2021-08-13 NOTE — Progress Notes (Signed)
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 Patient was recently diagnosed with small cell carcinoma in June 2023 and has just completed his first cycle of chemotherapy 1 week prior to his admission.  Labs pertinent include lactic acidosis, neutropenia, thrombocytopenia.  Imaging is suggestive of right middle lobe pneumonia.  Patient is admitted for postobstructive pneumonia,  started on IV antibiotics.  Oncology is consulted.  Blood cultures positive for Pseudomonas.  Infectious diseases consulted.  Assessment & Plan:   Principal Problem:   Febrile neutropenia (HCC) Active Problems:   Sepsis ([place])   Postobstructive pneumonia   Small cell lung cancer (H[facility])   COPD (chronic obstructive pulmonary disease) (HCC)   Thrombocytopenia (H[facility])  Febrile neutropenia: Patient with history of metastatic small cell lung cancer, who has recently completed his first cycle of chemotherapy and presented for the evaluation of fever. He is found to be neutropenic with a white cell count of 0.7 and ANC of 210. Patient has recently received Neulasta as an outpatient. Continue empiric antibiotic therapy with vancomycin and cefepime. Blood cultures grew Pseudomonas, vancomycin discontinued. Infectious diseases consulted, continue cefepime for now  Sepsis: Patient presented with fever with a Tmax of 101, tachycardia, tachypnea, lactic acidosis, neutropenia and imaging suggestive of right  middle lobe pneumonia. Initiated empiric antibiotic therapy with ( vancomycin and cefepime ). Vancomycin discontinued, continue cefepime for now. Lactic acid has improved with IV hydration.  Postobstructive pneumonia: Likely secondary to malignancy. Continue IV antibiotics( Cefepime)  Small cell lung cancer: Patient was diagnosed in June 2023 with metastatic small cell lung cancer involving right lung,  He is s/p first cycle of chemotherapy as well as right brain radiation treatment for metastatic disease to the brain.   Oncology consult Dr. Janese Banks was notified.  COPD: Continue as needed bronchodilator therapy and as well as inhaled steroid.   Not in an acute exacerbation.  Thrombocytopenia: Likely secondary to chemotherapy. There is no evidence of any active bleeding. Closely monitor during hospitalization.  DVT prophylaxis: SCDs Code Status: Full code. Family Communication: Wife at bed side. Disposition Plan:  Status is: Inpatient Remains inpatient appropriate because: Admitted for febrile neutropenia and sepsis secondary to postobstructive pneumonia requiring IV antibiotics.  Blood cultures grew Pseudomonas.  Infectious diseases consulted   Consultants:  Oncology Infectious diseases  Procedures: None Antimicrobials: Vancomycin Cefepime  Subjective: Patient was seen and examined at bedside.  Overnight events noted.  Patient reports feeling better. Patient has developed cough, generalized weakness and fever found to have pneumonia.  Objective: Vitals:   08/13/21 0600 08/13/21 0758 08/13/21 0801 08/13/21 1158  BP:   99/66 130/68  Pulse:   81 93  Resp:  16 16 16   Temp:  (!) 97.4 F (36.3 C) (!) 97.4 F (36.3 C) 99.1 F (37.3 C)  TempSrc:  Oral  Oral  SpO2:   94% 92%  Weight: 94.7 kg     Height:        Intake/Output Summary (Last 24 hours) at 08/13/2021 1338 Last data  filed at 08/13/2021 8416 Gross per 24 hour  Intake 3863.55 ml  Output 800 ml  Net 3063.55 ml    Filed Weights   08/12/21 1244 08/13/21 0600  Weight: 95.7 kg 94.7 kg    Examination:  General exam: Appears comfortable, not in any acute distress, deconditioned. Respiratory system: Decreased breath sounds bilaterally, no wheezing, normal respiratory effort. Cardiovascular system: S1 & S2 heard, regular rate and rhythm, no murmur. Gastrointestinal system: Abdomen is soft, non tender, non distended, BS+ Central nervous system: Alert and oriented x3. No focal neurological deficits. Extremities: Edema+, no cyanosis, no clubbing. Skin: No rashes, lesions or ulcers Psychiatry: Mood depressed, affect flat, denies suicidal homicidal ideation.    Data Reviewed: I have personally reviewed following labs and imaging studies  CBC: Recent Labs  Lab 08/07/21 1501 08/12/21 1041 08/13/21 0503  WBC 7.7 0.7* 0.7*  NEUTROABS 6.8 0.0*  --   HGB 16.5 15.7 11.8*  HCT 47.4 45.0 33.6*  MCV 91.3 91.6 88.9  PLT 134* 46* 38*   Basic Metabolic Panel: Recent Labs  Lab 08/07/21 1501 08/12/21 1041 08/13/21 0503  NA 135 133* 133*  K 4.8 4.1 4.0  CL 98 97* 104  CO2 26 26 22   GLUCOSE 171* 172* 127*  BUN 23 19 13   CREATININE 0.87 0.84 0.81  CALCIUM 9.3 9.1 8.5*   GFR: Estimated Creatinine Clearance: 100.1 mL/min (by C-G formula based on SCr of 0.81 mg/dL). Liver Function Tests: Recent Labs  Lab 08/07/21 1501 08/12/21 1041  AST 82* 35  ALT 92* 81*  ALKPHOS 62 77  BILITOT 1.8* 2.0*  PROT 7.2 7.4  ALBUMIN 3.6 3.8   No results for input(s): "LIPASE", "AMYLASE" in the last 168 hours. No results for input(s): "AMMONIA" in the last 168 hours. Coagulation Profile: Recent Labs  Lab 08/12/21 1300 08/13/21 0503  INR 1.0 1.1   Cardiac Enzymes: No results for input(s): "CKTOTAL", "CKMB", "CKMBINDEX", "TROPONINI" in the last 168 hours. BNP (last 3 results) No results for input(s): "PROBNP" in the last 8760 hours. HbA1C: No results for input(s): "HGBA1C" in the last 72  hours. CBG: No results for input(s): "GLUCAP" in the last 168 hours. Lipid Profile: No results for input(s): "CHOL", "HDL", "LDLCALC", "TRIG", "CHOLHDL", "LDLDIRECT" in the last 72 hours. Thyroid Function Tests: No results for input(s): "TSH", "T4TOTAL", "FREET4", "T3FREE", "THYROIDAB" in the last 72 hours. Anemia Panel: No results for input(s): "VITAMINB12", "FOLATE", "FERRITIN", "TIBC", "IRON", "RETICCTPCT" in the last 72 hours. Sepsis Labs: Recent Labs  Lab 08/12/21 1300 08/12/21 1820 08/13/21 0503  PROCALCITON  --   --  0.20  LATICACIDVEN 3.2* 1.8  --     Recent Results (from the past 240 hour(s))  Culture, blood (Routine x 2)     Status: None (Preliminary result)   Collection Time: 08/12/21 12:58 PM   Specimen: BLOOD  Result Value Ref Range Status   Specimen Description BLOOD PORT  Final   Special Requests   Final    BOTTLES DRAWN AEROBIC AND ANAEROBIC Blood Culture results may not be optimal due to an excessive volume of blood received in culture bottles   Culture  Setup Time   Final    GRAM NEGATIVE RODS ANAEROBIC BOTTLE ONLY Organism ID to follow CRITICAL RESULT CALLED TO, READ BACK BY AND VERIFIED WITHLu Duffel PHARMD 1300 08/13/21 HNM Performed at [facility], [address], [place], [place] [address]    Culture GRAM NEGATIVE RODS  Final   Report Status PENDING  Incomplete  Blood Culture ID Panel (Reflexed)     Status: Abnormal   Collection Time: 08/12/21 12:58 PM  Result Value Ref Range Status   Enterococcus faecalis NOT DETECTED NOT DETECTED Final   Enterococcus Faecium NOT DETECTED NOT DETECTED Final   Listeria monocytogenes NOT DETECTED NOT DETECTED Final   Staphylococcus species NOT DETECTED NOT DETECTED Final   Staphylococcus aureus (BCID) NOT DETECTED NOT DETECTED Final   Staphylococcus epidermidis NOT DETECTED NOT DETECTED Final   Staphylococcus lugdunensis NOT DETECTED NOT DETECTED Final   Streptococcus species NOT DETECTED NOT  DETECTED Final   Streptococcus agalactiae NOT DETECTED NOT DETECTED Final   Streptococcus pneumoniae NOT DETECTED NOT DETECTED Final   Streptococcus pyogenes NOT DETECTED NOT DETECTED Final   A.calcoaceticus-baumannii NOT DETECTED NOT DETECTED Final   Bacteroides fragilis NOT DETECTED NOT DETECTED Final   Enterobacterales NOT DETECTED NOT DETECTED Final   Enterobacter cloacae complex NOT DETECTED NOT DETECTED Final   Escherichia coli NOT DETECTED NOT DETECTED Final   Klebsiella aerogenes NOT DETECTED NOT DETECTED Final   Klebsiella oxytoca NOT DETECTED NOT DETECTED Final   Klebsiella pneumoniae NOT DETECTED NOT DETECTED Final   Proteus species NOT DETECTED NOT DETECTED Final   Salmonella species NOT DETECTED NOT DETECTED Final   Serratia marcescens NOT DETECTED NOT DETECTED Final   Haemophilus influenzae NOT DETECTED NOT DETECTED Final   Neisseria meningitidis NOT DETECTED NOT DETECTED Final   Pseudomonas aeruginosa DETECTED (A) NOT DETECTED Final    Comment: CRITICAL RESULT CALLED TO, READ BACK BY AND VERIFIED WITH: DEVAN MITCHELL PHARMD 1300 08/13/21 HNM    Stenotrophomonas maltophilia NOT DETECTED NOT DETECTED Final   Candida albicans NOT DETECTED NOT DETECTED Final   Candida auris NOT DETECTED NOT DETECTED Final   Candida glabrata NOT DETECTED NOT DETECTED Final   Candida krusei NOT DETECTED NOT DETECTED Final   Candida parapsilosis NOT DETECTED NOT DETECTED Final   Candida tropicalis NOT DETECTED NOT DETECTED Final   Cryptococcus neoformans/gattii NOT DETECTED NOT DETECTED Final   CTX-M ESBL NOT DETECTED NOT DETECTED Final   Carbapenem resistance IMP NOT DETECTED NOT DETECTED Final   Carbapenem resistance KPC NOT DETECTED NOT DETECTED Final   Carbapenem resistance NDM NOT DETECTED NOT DETECTED Final   Carbapenem resistance VIM NOT DETECTED NOT DETECTED Final    Comment: Performed at [facility], [address] [place], [place] [address]  Culture, blood (Routine x 2)      Status: None (Preliminary result)   Collection Time: 08/12/21  1:00 PM   Specimen: BLOOD  Result Value Ref Range Status   Specimen Description BLOOD LEFT AC  Final   Special Requests   Final    BOTTLES DRAWN AEROBIC AND ANAEROBIC Blood Culture results may not be optimal due to an excessive volume of blood received in culture bottles   Culture   Final    NO GROWTH < 24 HOURS Performed at [facility], [address] [place], [place] [address]    Report Status PENDING  Incomplete    Radiology Studies: DG Chest 2 View  Result Date: 08/12/2021 CLINICAL DATA:  Sepsis, syncope, short of breath EXAM: CHEST - 2 VIEW COMPARISON:  04/30/2021 FINDINGS: 3 Port in the anterior chest wall with tip in distal SVC. Normal mediastinum and cardiac silhouette. Normal pulmonary vasculature Density over the heart on lateral projection is favored RIGHT middle lobe opacity. No pleural fluid IMPRESSION: Concern for RIGHT middle lobe pneumonia or atelectasis Electronically Signed   By: Suzy Bouchard  M.D.   On: 08/12/2021 13:17     Scheduled Meds:  arformoterol  15 mcg Nebulization BID   And   umeclidinium bromide  1 puff Inhalation Daily   budesonide  0.25 mg Nebulization BID   Chlorhexidine Gluconate Cloth  6 each Topical Daily   dexamethasone  4 mg Oral Daily   pantoprazole  20 mg Oral Daily   Continuous Infusions:  sodium chloride Stopped (08/13/21 0652)   ceFEPime (MAXIPIME) IV 2 g (08/13/21 0544)     LOS: 1 day    Time spent: 50 Mins    Ishaq Maffei, MD [facility]   If 7PM-7AM, please contact night-coverage

## 2021-08-13 NOTE — Progress Notes (Signed)
PHARMACY - PHYSICIAN COMMUNICATION CRITICAL VALUE ALERT - BLOOD CULTURE IDENTIFICATION (BCID)  Maurice Simpson is an 66 y.o. male who presented to [facility] on 08/12/2021 with a chief complaint of fever, cough, SOB  Assessment:  8/2 blood culture with GNR, BCID detects P aeruginosa.  Patient recently started on chemo for advanced lung cancer.  Noted to be neutropenic.   Name of physician (or Provider) Contacted: Dr Madaline Brilliant  Current antibiotics: vanco/cefepime  Changes to prescribed antibiotics recommended:  Patient is on recommended antibiotics - No changes needed Stop vanco Consult ID  Results for orders placed or performed during the hospital encounter of 08/12/21  Blood Culture ID Panel (Reflexed) (Collected: 08/12/2021 12:58 PM)  Result Value Ref Range   Enterococcus faecalis NOT DETECTED NOT DETECTED   Enterococcus Faecium NOT DETECTED NOT DETECTED   Listeria monocytogenes NOT DETECTED NOT DETECTED   Staphylococcus species NOT DETECTED NOT DETECTED   Staphylococcus aureus (BCID) NOT DETECTED NOT DETECTED   Staphylococcus epidermidis NOT DETECTED NOT DETECTED   Staphylococcus lugdunensis NOT DETECTED NOT DETECTED   Streptococcus species NOT DETECTED NOT DETECTED   Streptococcus agalactiae NOT DETECTED NOT DETECTED   Streptococcus pneumoniae NOT DETECTED NOT DETECTED   Streptococcus pyogenes NOT DETECTED NOT DETECTED   A.calcoaceticus-baumannii NOT DETECTED NOT DETECTED   Bacteroides fragilis NOT DETECTED NOT DETECTED   Enterobacterales NOT DETECTED NOT DETECTED   Enterobacter cloacae complex NOT DETECTED NOT DETECTED   Escherichia coli NOT DETECTED NOT DETECTED   Klebsiella aerogenes NOT DETECTED NOT DETECTED   Klebsiella oxytoca NOT DETECTED NOT DETECTED   Klebsiella pneumoniae NOT DETECTED NOT DETECTED   Proteus species NOT DETECTED NOT DETECTED   Salmonella species NOT DETECTED NOT DETECTED   Serratia marcescens NOT DETECTED NOT DETECTED   Haemophilus influenzae NOT  DETECTED NOT DETECTED   Neisseria meningitidis NOT DETECTED NOT DETECTED   Pseudomonas aeruginosa DETECTED (A) NOT DETECTED   Stenotrophomonas maltophilia NOT DETECTED NOT DETECTED   Candida albicans NOT DETECTED NOT DETECTED   Candida auris NOT DETECTED NOT DETECTED   Candida glabrata NOT DETECTED NOT DETECTED   Candida krusei NOT DETECTED NOT DETECTED   Candida parapsilosis NOT DETECTED NOT DETECTED   Candida tropicalis NOT DETECTED NOT DETECTED   Cryptococcus neoformans/gattii NOT DETECTED NOT DETECTED   CTX-M ESBL NOT DETECTED NOT DETECTED   Carbapenem resistance IMP NOT DETECTED NOT DETECTED   Carbapenem resistance KPC NOT DETECTED NOT DETECTED   Carbapenem resistance NDM NOT DETECTED NOT DETECTED   Carbapenem resistance VIM NOT DETECTED NOT DETECTED   Doreene Eland, PharmD, BCPS, BCIDP Work Cell: [phone] 08/13/2021 1:13 PM

## 2021-08-13 NOTE — Consult Note (Addendum)
Hematology/Oncology Consult note [facility] [phone] [phone]  Patient Care Team: [name], NP as PCP - General (Nurse Practitioner) Telford Nab, RN as Oncology Nurse Navigator   Name of the patient: Maurice Simpson  [id number]  12-23-1955    Reason for consult: Neutropenic fever   Requesting physician: Dr. 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  He recently completed whole brain radiation treatment and was given cycle 1 of carbo etoposide Tecentriq chemotherapy with growth factor support on 08/03/2021.  Patient admitted to the hospital with symptoms of fever cough worsening shortness of breath as well as poor oral intake.  White cell count is low at 0.7 with an ANC of 0 and a platelet count of 38.  Hemoglobin dropped from 15.7-11.8.Patient is breathSimply medically stable and has remained afebrile in the last 24 hours.  Tmax of 101.2 yesterday morning.  Temperature curve is coming down since then.  He is presently on cefepime and vancomycin.  He had a CT angio chest in the ER which did not show any evidence of pulmonary embolus.  Known right infrahilar mass consistent with malignancy.  There was no pneumothorax or pleural effusion noted.  Patient reports feeling better since admission.  He still gets exertional shortness of breath.  Abdominal pain is currently well controlled  ECOG PS- 2  Pain scale- 2   Review of systems- Review of Systems  Constitutional:  Positive for malaise/fatigue. Negative for chills, fever and weight loss.  HENT:  Negative for congestion, ear discharge and nosebleeds.   Eyes:  Negative for blurred vision.  Respiratory:  Positive for shortness of breath. Negative for cough, hemoptysis, sputum production and wheezing.    Cardiovascular:  Negative for chest pain, palpitations, orthopnea and claudication.  Gastrointestinal:  Negative for abdominal pain, blood in stool, constipation, diarrhea, heartburn, melena, nausea and vomiting.  Genitourinary:  Negative for dysuria, flank pain, frequency, hematuria and urgency.  Musculoskeletal:  Negative for back pain, joint pain and myalgias.  Skin:  Negative for rash.  Neurological:  Negative for dizziness, tingling, focal weakness, seizures, weakness and headaches.  Endo/Heme/Allergies:  Does not bruise/bleed easily.  Psychiatric/Behavioral:  Negative for depression and suicidal ideas. The patient does not have insomnia.     Allergies  Allergen Reactions   Bee Venom     Patient Active Problem List   Diagnosis Date Noted   Febrile neutropenia ([place]) 08/12/2021   Sepsis ([place]) 08/12/2021   Postobstructive pneumonia 08/12/2021   Thrombocytopenia ([place]) 08/12/2021   Small cell lung cancer ([place]) 07/21/2021   Mass of lower lobe of right lung 06/24/2021   Weight loss, abnormal 06/24/2021   Cardiovascular risk factor 05/12/2021   Bee sting allergy 05/12/2021   Obesity 07/20/2019   Nicotine dependence, cigarettes, uncomplicated 82/42/3536   Family history of colon cancer 07/20/2019   COPD (chronic obstructive pulmonary disease) ([place]) 07/20/2019   Preventative health care 07/20/2019     Past Medical History:  Diagnosis Date   Cancer [facility])    COPD (chronic obstructive pulmonary disease) ([place])    Dyspnea      Past Surgical History:  Procedure Laterality Date   CATARACT EXTRACTION Right 03/10/2021   IR IMAGING GUIDED PORT INSERTION  07/27/2021    Social History   Socioeconomic History   Marital status: Married  Spouse name: Not on file   Number of children: Not on file   Years of education: Not on file   Highest education level: Not on file  Occupational History   Not on file  Tobacco Use   Smoking status: Every Day    Packs/day: 2.00    Years:  51.00    Total pack years: 102.00    Types: Cigarettes   Smokeless tobacco: Never  Vaping Use   Vaping Use: Never used  Substance and Sexual Activity   Alcohol use: Not Currently   Drug use: Never   Sexual activity: Yes  Other Topics Concern   Not on file  Social History Narrative   Not on file   Social Determinants of Health   Financial Resource Strain: Low Risk  (07/13/2021)   Overall Financial Resource Strain (CARDIA)    Difficulty of Paying Living Expenses: Not hard at all  Food Insecurity: No Food Insecurity (07/13/2021)   Hunger Vital Sign    Worried About Running Out of Food in the Last Year: Never true    Ran Out of Food in the Last Year: Never true  Transportation Needs: No Transportation Needs (07/13/2021)   PRAPARE - Hydrologist (Medical): No    Lack of Transportation (Non-Medical): No  Physical Activity: Inactive (07/20/2019)   Exercise Vital Sign    Days of Exercise per Week: 0 days    Minutes of Exercise per Session: 0 min  Stress: Stress Concern Present (07/13/2021)   [place]    Feeling of Stress : To some extent  Social Connections: Moderately Isolated (07/13/2021)   Social Connection and Isolation Panel [NHANES]    Frequency of Communication with Friends and Family: More than three times a week    Frequency of Social Gatherings with Friends and Family: Twice a week    Attends Religious Services: Never    Marine scientist or Organizations: No    Attends Archivist Meetings: Never    Marital Status: Married  Human resources officer Violence: Not At Risk (07/13/2021)   Humiliation, Afraid, Rape, and Kick questionnaire    Fear of Current or Ex-Partner: No    Emotionally Abused: No    Physically Abused: No    Sexually Abused: No     Family History  Problem Relation Age of Onset   Diabetes Mother    Hypertension Mother    Diabetes Father    Hypertension  Father    Diabetes Sister    Cancer - Colon Cousin      Current Facility-Administered Medications:    0.9 %  sodium chloride infusion, , Intravenous, PRN, Agbata, Tochukwu, MD, Stopped at 08/13/21 6378   acetaminophen (TYLENOL) tablet 650 mg, 650 mg, Oral, Q6H PRN, 650 mg at 08/13/21 0322 **OR** acetaminophen (TYLENOL) suppository 650 mg, 650 mg, Rectal, Q6H PRN, Agbata, Tochukwu, MD   albuterol (PROVENTIL) (2.5 MG/3ML) 0.083% nebulizer solution 2.5 mg, 2.5 mg, Nebulization, Q6H PRN, Agbata, Tochukwu, MD   arformoterol (BROVANA) nebulizer solution 15 mcg, 15 mcg, Nebulization, BID, 15 mcg at 08/13/21 0734 **AND** umeclidinium bromide (INCRUSE ELLIPTA) 62.5 MCG/ACT 1 puff, 1 puff, Inhalation, Daily, Agbata, Tochukwu, MD, 1 puff at 08/13/21 0939   budesonide (PULMICORT) nebulizer solution 0.25 mg, 0.25 mg, Nebulization, BID, Agbata, Tochukwu, MD, 0.25 mg at 08/13/21 0734   ceFEPIme (MAXIPIME) 2 g in sodium chloride 0.9 % 100 mL IVPB, 2 g, Intravenous, Q8H, Childs, Caroline A,  RPH, Last Rate: 200 mL/hr at 08/13/21 1402, 2 g at 08/13/21 1402   Chlorhexidine Gluconate Cloth 2 % PADS 6 each, 6 each, Topical, Daily, Agbata, Tochukwu, MD, 6 each at 08/13/21 0952   dexamethasone (DECADRON) tablet 4 mg, 4 mg, Oral, Daily, Agbata, Tochukwu, MD, 4 mg at 08/13/21 8182   magic mouthwash w/lidocaine, 5 mL, Oral, QID PRN, Agbata, Tochukwu, MD, 5 mL at 08/13/21 0951   ondansetron (ZOFRAN) tablet 4 mg, 4 mg, Oral, Q6H PRN, 4 mg at 08/13/21 0943 **OR** ondansetron (ZOFRAN) injection 4 mg, 4 mg, Intravenous, Q6H PRN, Agbata, Tochukwu, MD, 4 mg at 08/12/21 1816   oxyCODONE (Oxy IR/ROXICODONE) immediate release tablet 5 mg, 5 mg, Oral, Q4H PRN, Agbata, Tochukwu, MD, 5 mg at 08/13/21 1359   pantoprazole (PROTONIX) EC tablet 20 mg, 20 mg, Oral, Daily, Agbata, Tochukwu, MD, 20 mg at 08/13/21 0938   traZODone (DESYREL) tablet 50 mg, 50 mg, Oral, QHS PRN, Collier Bullock, MD   Physical exam:  Vitals:   08/13/21 0758  08/13/21 0801 08/13/21 1158 08/13/21 1515  BP:  99/66 130/68 110/68  Pulse:  81 93 89  Resp: 16 16 16 16   Temp: (!) 97.4 F (36.3 C) (!) 97.4 F (36.3 C) 99.1 F (37.3 C) 98.8 F (37.1 C)  TempSrc: Oral  Oral Oral  SpO2:  94% 92% (!) 89%  Weight:      Height:       Physical Exam HENT:     Mouth/Throat:     Mouth: Mucous membranes are moist.     Pharynx: Oropharynx is clear.  Cardiovascular:     Rate and Rhythm: Normal rate and regular rhythm.     Heart sounds: Normal heart sounds.  Pulmonary:     Effort: Pulmonary effort is normal.     Comments: Scattered b/l rhonchi Abdominal:     General: Bowel sounds are normal. There is no distension.     Palpations: Abdomen is soft.     Tenderness: There is no abdominal tenderness.  Skin:    General: Skin is warm and dry.  Neurological:     Mental Status: He is alert and oriented to person, place, and time.           Latest Ref Rng & Units 08/13/2021    5:03 AM  CMP  Glucose 70 - 99 mg/dL 127   BUN 8 - 23 mg/dL 13   Creatinine 0.61 - 1.24 mg/dL 0.81   Sodium 135 - 145 mmol/L 133   Potassium 3.5 - 5.1 mmol/L 4.0   Chloride 98 - 111 mmol/L 104   CO2 22 - 32 mmol/L 22   Calcium 8.9 - 10.3 mg/dL 8.5       Latest Ref Rng & Units 08/13/2021    5:03 AM  CBC  WBC 4.0 - 10.5 K/uL 0.7   Hemoglobin 13.0 - 17.0 g/dL 11.8   Hematocrit 39.0 - 52.0 % 33.6   Platelets 150 - 400 K/uL 38     @IMAGES @  DG Chest 2 View  Result Date: 08/12/2021 CLINICAL DATA:  Sepsis, syncope, short of breath EXAM: CHEST - 2 VIEW COMPARISON:  04/30/2021 FINDINGS: 3 Port in the anterior chest wall with tip in distal SVC. Normal mediastinum and cardiac silhouette. Normal pulmonary vasculature Density over the heart on lateral projection is favored RIGHT middle lobe opacity. No pleural fluid IMPRESSION: Concern for RIGHT middle lobe pneumonia or atelectasis Electronically Signed   By: Suzy Bouchard M.D.   On:  08/12/2021 13:17   CT Angio Chest Pulmonary  Embolism (PE) W or WO Contrast  Result Date: 08/07/2021 CLINICAL DATA:  Chest pain. EXAM: CT ANGIOGRAPHY CHEST WITH CONTRAST TECHNIQUE: Multidetector CT imaging of the chest was performed using the standard protocol during bolus administration of intravenous contrast. Multiplanar CT image reconstructions and MIPs were obtained to evaluate the vascular anatomy. RADIATION DOSE REDUCTION: This exam was performed according to the departmental dose-optimization program which includes automated exposure control, adjustment of the mA and/or kV according to patient size and/or use of iterative reconstruction technique. CONTRAST:  37mL OMNIPAQUE IOHEXOL 350 MG/ML SOLN COMPARISON:  May 29, 2021. FINDINGS: Cardiovascular: Satisfactory opacification of the pulmonary arteries to the segmental level. No evidence of pulmonary embolism. Normal heart size. No pericardial effusion. Mediastinum/Nodes: No enlarged mediastinal, hilar, or axillary lymph nodes. Thyroid gland, trachea, and esophagus demonstrate no significant findings. Lungs/Pleura: No pneumothorax or pleural effusion is noted. 5.5 x 3.8 cm right infrahilar mass is again noted consistent with malignancy. Mild postobstructive atelectasis is noted. Upper Abdomen: Bilateral adrenal masses are noted concerning for metastatic disease. Musculoskeletal: No chest wall abnormality. No acute or significant osseous findings. Review of the MIP images confirms the above findings. IMPRESSION: No definite evidence of pulmonary embolus. 5.5 x 3.8 cm right infrahilar mass is noted consistent with malignancy with associated postobstructive atelectasis. Bilateral adrenal masses are noted concerning for metastatic disease. Electronically Signed   By: Marijo Conception M.D.   On: 08/07/2021 16:17   IR IMAGING GUIDED PORT INSERTION  Result Date: 07/27/2021 CLINICAL DATA:  Metastatic small cell lung carcinoma and need for porta cath for chemotherapy. EXAM: IMPLANTED PORT A CATH PLACEMENT  WITH ULTRASOUND AND FLUOROSCOPIC GUIDANCE ANESTHESIA/SEDATION: Moderate (conscious) sedation was employed during this procedure. A total of Versed 2.0 mg and Fentanyl 100 mcg was administered intravenously by radiology nursing. Moderate Sedation Time: 20 minutes. The patient's level of consciousness and vital signs were monitored continuously by radiology nursing throughout the procedure under my direct supervision. FLUOROSCOPY: 36 seconds.  11.4 mGy. PROCEDURE: The procedure, risks, benefits, and alternatives were explained to the patient. Questions regarding the procedure were encouraged and answered. The patient understands and consents to the procedure. A time-out was performed prior to initiating the procedure. Ultrasound was utilized to confirm patency of the right internal jugular vein. A permanent ultrasound image was recorded. The right neck and chest were prepped with chlorhexidine in a sterile fashion, and a sterile drape was applied covering the operative field. Maximum barrier sterile technique with sterile gowns and gloves were used for the procedure. Local anesthesia was provided with 1% lidocaine. After creating a small venotomy incision, a 21 gauge needle was advanced into the right internal jugular vein under direct, real-time ultrasound guidance. Ultrasound image documentation was performed. After securing guidewire access, an 8 Fr dilator was placed. A J-wire was kinked to measure appropriate catheter length. A subcutaneous port pocket was then created along the upper chest wall utilizing sharp and blunt dissection. Portable cautery was utilized. The pocket was irrigated with sterile saline. A single lumen power injectable port was chosen for placement. The 8 Fr catheter was tunneled from the port pocket site to the venotomy incision. The port was placed in the pocket. External catheter was trimmed to appropriate length based on guidewire measurement. At the venotomy, an 8 Fr peel-away sheath was  placed over a guidewire. The catheter was then placed through the sheath and the sheath removed. Final catheter positioning was confirmed and documented with  a fluoroscopic spot image. The port was accessed with a needle and aspirated and flushed with heparinized saline. The access needle was removed. The venotomy and port pocket incisions were closed with subcutaneous 3-0 Monocryl and subcuticular 4-0 Vicryl. Dermabond was applied to both incisions. COMPLICATIONS: COMPLICATIONS None FINDINGS: After catheter placement, the tip lies at the cavo-atrial junction. The catheter aspirates normally and is ready for immediate use. IMPRESSION: Placement of single lumen port a cath via right internal jugular vein. The catheter tip lies at the cavo-atrial junction. A power injectable port a cath was placed and is ready for immediate use. Electronically Signed   By: Aletta Edouard M.D.   On: 07/27/2021 12:58    Assessment and plan- Patient is a 66 y.o. male with extensive stage small cell lung cancer s/p 1 cycle of carbo etoposide Tecentriq chemotherapy admitted for neutropenic fever  Pancytopenia: Patient did receive Neulasta as an outpatient following chemotherapy and therefore does not require Neupogen.  Pancytopenia currently is expected following chemotherapy.  Counts are likely to improve in the next 1 week but patient does not need to stay inpatient from pancytopenia standpoint as long as platelet count is not downtrending further and patient remains afebrile  Neutropenic fever: Blood cultures were positive and Pseudomonas was detected.  ID has been consulted. Appreciate input. He is currently on cefepime and vancomycin has been discontinued.  Lactic acid improving.  Extensive stage small cell lung cancer: Treatments will be on hold until acute issues resolve.    Visit Diagnosis 1. Neutropenic fever ([place])   2. Pneumonia of right lower lobe due to infectious organism     Dr. Randa Evens, MD, MPH [facility]  [facility] [phone] 08/13/2021

## 2021-08-13 NOTE — Consult Note (Signed)
NAME: Maurice Simpson  DOB: 08-10-1955  MRN: [id number]  Date/Time: 08/13/2021 3:44 PM  REQUESTING PROVIDER: Dwyane Dee Subjective:  REASON FOR CONSULT: pseudomonas bacteremia and febrile neutropenia ? Maurice Simpson is a 66 y.o. 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  At that time he was complaining of shortness of breath and was hypoxic.  A CT scan was done of the chest that showed a large right infrahilar mass with postobstructive atelectasis.  Home oxygen was ordered.  Patient then presented to the clinic on 08/12/2021 with chills intermittent fevers lethargy cough and shortness of breath for the past couple of days. He was referred to the ED S/p whole brain radiation Pt says he has had a sore on his head and above upper lip since his radiation Vitals on 08/12/21   08/12/21 10:51  BP 129/72  Temp 101.2 F (38.4 C) !  Pulse Rate 101 !  Resp 18  SpO2 95 % [1]    Latest Reference Range & Units 08/12/21 10:41  WBC 4.0 - 10.5 K/uL 0.7 (LL) [1]  Hemoglobin 13.0 - 17.0 g/dL 15.7  HCT 39.0 - 52.0 % 45.0  Platelets 150 - 400 K/uL 46 (L) [2]   Past Medical History:  Diagnosis Date   Cancer ([place])    COPD (chronic obstructive pulmonary disease) ([place])    Dyspnea     Past Surgical History:  Procedure Laterality Date   CATARACT EXTRACTION Right 03/10/2021   IR IMAGING GUIDED PORT INSERTION  07/27/2021    Social History   Socioeconomic History   Marital status: Married    Spouse name: Not on file   Number of children: Not on file   Years of education: Not on file   Highest education level: Not on file  Occupational History   Not on file  Tobacco Use   Smoking status: Every Day    Packs/day: 2.00    Years: 51.00     Total pack years: 102.00    Types: Cigarettes   Smokeless tobacco: Never  Vaping Use   Vaping Use: Never used  Substance and Sexual Activity   Alcohol use: Not Currently   Drug use: Never   Sexual activity: Yes  Other Topics Concern   Not on file  Social History Narrative   Not on file   Social Determinants of Health   Financial Resource Strain: Low Risk  (07/13/2021)   Overall Financial Resource Strain (CARDIA)    Difficulty of Paying Living Expenses: Not hard at all  Food Insecurity: No Food Insecurity (07/13/2021)   Hunger Vital Sign    Worried About Running Out of Food in the Last Year: Never true    Ran Out of Food in the Last Year: Never true  Transportation Needs: No Transportation Needs (07/13/2021)   PRAPARE - Hydrologist (Medical): No    Lack of Transportation (Non-Medical): No  Physical Activity: Inactive (07/20/2019)   Exercise Vital Sign    Days of Exercise per Week: 0 days    Minutes of Exercise per Session: 0 min  Stress: Stress Concern Present (07/13/2021)   [place]    Feeling of Stress : To  some extent  Social Connections: Moderately Isolated (07/13/2021)   Social Connection and Isolation Panel [NHANES]    Frequency of Communication with Friends and Family: More than three times a week    Frequency of Social Gatherings with Friends and Family: Twice a week    Attends Religious Services: Never    Marine scientist or Organizations: No    Attends Archivist Meetings: Never    Marital Status: Married  Human resources officer Violence: Not At Risk (07/13/2021)   Humiliation, Afraid, Rape, and Kick questionnaire    Fear of Current or Ex-Partner: No    Emotionally Abused: No    Physically Abused: No    Sexually Abused: No    Family History  Problem Relation Age of Onset   Diabetes Mother    Hypertension Mother    Diabetes Father    Hypertension Father     Diabetes Sister    Cancer - Colon Cousin    Allergies  Allergen Reactions   Bee Venom    I? Current Facility-Administered Medications  Medication Dose Route Frequency Provider Last Rate Last Admin   0.9 %  sodium chloride infusion   Intravenous PRN Agbata, Tochukwu, MD   Stopped at 08/13/21 7616   acetaminophen (TYLENOL) tablet 650 mg  650 mg Oral Q6H PRN Agbata, Tochukwu, MD   650 mg at 08/13/21 0737   Or   acetaminophen (TYLENOL) suppository 650 mg  650 mg Rectal Q6H PRN Agbata, Tochukwu, MD       albuterol (PROVENTIL) (2.5 MG/3ML) 0.083% nebulizer solution 2.5 mg  2.5 mg Nebulization Q6H PRN Agbata, Tochukwu, MD       arformoterol (BROVANA) nebulizer solution 15 mcg  15 mcg Nebulization BID Agbata, Tochukwu, MD   15 mcg at 08/13/21 0734   And   umeclidinium bromide (INCRUSE ELLIPTA) 62.5 MCG/ACT 1 puff  1 puff Inhalation Daily Agbata, Tochukwu, MD   1 puff at 08/13/21 0939   budesonide (PULMICORT) nebulizer solution 0.25 mg  0.25 mg Nebulization BID Agbata, Tochukwu, MD   0.25 mg at 08/13/21 0734   ceFEPIme (MAXIPIME) 2 g in sodium chloride 0.9 % 100 mL IVPB  2 g Intravenous Q8H Mickeal Skinner A, RPH 200 mL/hr at 08/13/21 1402 2 g at 08/13/21 1402   Chlorhexidine Gluconate Cloth 2 % PADS 6 each  6 each Topical Daily Agbata, Tochukwu, MD   6 each at 08/13/21 0952   dexamethasone (DECADRON) tablet 4 mg  4 mg Oral Daily Agbata, Tochukwu, MD   4 mg at 08/13/21 1062   magic mouthwash w/lidocaine  5 mL Oral QID PRN Agbata, Tochukwu, MD   5 mL at 08/13/21 0951   ondansetron (ZOFRAN) tablet 4 mg  4 mg Oral Q6H PRN Agbata, Tochukwu, MD   4 mg at 08/13/21 0943   Or   ondansetron (ZOFRAN) injection 4 mg  4 mg Intravenous Q6H PRN Agbata, Tochukwu, MD   4 mg at 08/12/21 1816   oxyCODONE (Oxy IR/ROXICODONE) immediate release tablet 5 mg  5 mg Oral Q4H PRN Agbata, Tochukwu, MD   5 mg at 08/13/21 1359   pantoprazole (PROTONIX) EC tablet 20 mg  20 mg Oral Daily Agbata, Tochukwu, MD   20 mg at 08/13/21  0938   traZODone (DESYREL) tablet 50 mg  50 mg Oral QHS PRN Agbata, Tochukwu, MD         Abtx:  Anti-infectives (From admission, onward)    Start     Dose/Rate Route Frequency Ordered Stop  08/13/21 1000  vancomycin (VANCOREADY) IVPB 1750 mg/350 mL  Status:  Discontinued        1,750 mg 175 mL/hr over 120 Minutes Intravenous Every 24 hours 08/12/21 2133 08/13/21 1310   08/13/21 0100  ceFEPIme (MAXIPIME) 2 g in sodium chloride 0.9 % 100 mL IVPB        2 g 200 mL/hr over 30 Minutes Intravenous Every 8 hours 08/12/21 1654     08/12/21 1700  vancomycin (VANCOCIN) IVPB 1000 mg/200 mL premix        1,000 mg 200 mL/hr over 60 Minutes Intravenous  Once 08/12/21 1646 08/12/21 2300   08/12/21 1630  vancomycin (VANCOCIN) IVPB 1000 mg/200 mL premix  Status:  Discontinued        1,000 mg 200 mL/hr over 60 Minutes Intravenous  Once 08/12/21 1616 08/12/21 1616   08/12/21 1630  ceFEPIme (MAXIPIME) 2 g in sodium chloride 0.9 % 100 mL IVPB  Status:  Discontinued        2 g 200 mL/hr over 30 Minutes Intravenous  Once 08/12/21 1616 08/12/21 1617   08/12/21 1515  vancomycin (VANCOCIN) IVPB 1000 mg/200 mL premix        1,000 mg 200 mL/hr over 60 Minutes Intravenous  Once 08/12/21 1501 08/12/21 1854   08/12/21 1515  ceFEPIme (MAXIPIME) 2 g in sodium chloride 0.9 % 100 mL IVPB        2 g 200 mL/hr over 30 Minutes Intravenous  Once 08/12/21 1501 08/12/21 1854       REVIEW OF SYSTEMS:  Const:  fever, chills, negative weight loss Eyes: negative diplopia or visual changes, negative eye pain ENT: negative coryza, negative sore throat Resp: +ve cough, hemoptysis, +dyspnea Cards: negative for chest pain, palpitations, lower extremity edema GU: negative for frequency, dysuria and hematuria GI: Negative for abdominal pain, diarrhea, bleeding, constipation Skin: rash eft side of forehead Heme: negative for easy bruising and gum/nose bleeding MS: negative for myalgias, arthralgias, back pain and muscle  weakness Neurolo:negative for headaches, dizziness, vertigo, memory problems  Psych: negative for feelings of anxiety, depression  Endocrine: negative for thyroid, diabetes Allergy/Immunology- negative for any medication or food allergies ?  Objective:  VITALS:  BP 110/68 (BP Location: Right Arm)   Pulse 89   Temp 98.8 F (37.1 C) (Oral)   Resp 16   Ht 5\' 8"  (1.727 m)   Wt 94.7 kg   SpO2 (!) 89%   BMI 31.74 kg/m  LDA PORT PHYSICAL EXAM:  General: Alert, cooperative, no distress, appears stated age.  Head: Normocephalic, without obvious abnormality, atraumatic. Eyes: Conjunctivae clear, anicteric sclerae. Pupils are equal Reddish hue over forehead, upper neck - says it is from radiation Also has an eczematous rash on the left side of the forehead Scab on the philtrum( covered by hs moustache ENT Nares normal. No drainage or sinus tenderness. Lips, mucosa, and tongue normal. No Thrush Neck: Supple, symmetrical, no adenopathy, thyroid: non tender no carotid bruit and no JVD. Back: No CVA tenderness. Lungs: Clear to auscultation bilaterally. No Wheezing or Rhonchi. No rales. Heart: Regular rate and rhythm, no murmur, rub or gallop. Abdomen: Soft, non-tender,not distended. Bowel sounds normal. No masses Extremities: atraumatic, no cyanosis. No edema. No clubbing Skin: No rashes or lesions. Or bruising Lymph: Cervical, supraclavicular normal. Neurologic: Grossly non-focal Pertinent Labs Lab Results CBC    Component Value Date/Time   WBC 0.7 (LL) 08/13/2021 0503   RBC 3.78 (L) 08/13/2021 0503   HGB 11.8 (L) 08/13/2021 0503  HGB 18.0 (H) 04/30/2021 1136   HCT 33.6 (L) 08/13/2021 0503   HCT 53.3 (H) 04/30/2021 1136   PLT 38 (L) 08/13/2021 0503   PLT 156 04/30/2021 1136   MCV 88.9 08/13/2021 0503   MCV 91 04/30/2021 1136   MCV 94 01/21/2011 0134   MCH 31.2 08/13/2021 0503   MCHC 35.1 08/13/2021 0503   RDW 12.3 08/13/2021 0503   RDW 13.0 04/30/2021 1136   RDW 13.1  01/21/2011 0134   LYMPHSABS 0.6 (L) 08/12/2021 1041   LYMPHSABS 2.9 04/30/2021 1136   MONOABS 0.0 (L) 08/12/2021 1041   EOSABS 0.0 08/12/2021 1041   EOSABS 0.3 04/30/2021 1136   BASOSABS 0.0 08/12/2021 1041   BASOSABS 0.1 04/30/2021 1136       Latest Ref Rng & Units 08/13/2021    5:03 AM 08/12/2021   10:41 AM 08/07/2021    3:01 PM  CMP  Glucose 70 - 99 mg/dL 127  172  171   BUN 8 - 23 mg/dL 13  19  23    Creatinine 0.61 - 1.24 mg/dL 0.81  0.84  0.87   Sodium 135 - 145 mmol/L 133  133  135   Potassium 3.5 - 5.1 mmol/L 4.0  4.1  4.8   Chloride 98 - 111 mmol/L 104  97  98   CO2 22 - 32 mmol/L 22  26  26    Calcium 8.9 - 10.3 mg/dL 8.5  9.1  9.3   Total Protein 6.5 - 8.1 g/dL  7.4  7.2   Total Bilirubin 0.3 - 1.2 mg/dL  2.0  1.8   Alkaline Phos 38 - 126 U/L  77  62   AST 15 - 41 U/L  35  82   ALT 0 - 44 U/L  81  92       Microbiology: Recent Results (from the past 240 hour(s))  Culture, blood (Routine x 2)     Status: None (Preliminary result)   Collection Time: 08/12/21 12:58 PM   Specimen: BLOOD  Result Value Ref Range Status   Specimen Description BLOOD PORT  Final   Special Requests   Final    BOTTLES DRAWN AEROBIC AND ANAEROBIC Blood Culture results may not be optimal due to an excessive volume of blood received in culture bottles   Culture  Setup Time   Final    GRAM NEGATIVE RODS ANAEROBIC BOTTLE ONLY Organism ID to follow CRITICAL RESULT CALLED TO, READ BACK BY AND VERIFIED WITH: Lu Duffel PHARMD 1300 08/13/21 HNM Performed at [facility], [address] [place], [place] [address]    Culture GRAM NEGATIVE RODS  Final   Report Status PENDING  Incomplete  Blood Culture ID Panel (Reflexed)     Status: Abnormal   Collection Time: 08/12/21 12:58 PM  Result Value Ref Range Status   Enterococcus faecalis NOT DETECTED NOT DETECTED Final   Enterococcus Faecium NOT DETECTED NOT DETECTED Final   Listeria monocytogenes NOT DETECTED NOT DETECTED Final    Staphylococcus species NOT DETECTED NOT DETECTED Final   Staphylococcus aureus (BCID) NOT DETECTED NOT DETECTED Final   Staphylococcus epidermidis NOT DETECTED NOT DETECTED Final   Staphylococcus lugdunensis NOT DETECTED NOT DETECTED Final   Streptococcus species NOT DETECTED NOT DETECTED Final   Streptococcus agalactiae NOT DETECTED NOT DETECTED Final   Streptococcus pneumoniae NOT DETECTED NOT DETECTED Final   Streptococcus pyogenes NOT DETECTED NOT DETECTED Final   A.calcoaceticus-baumannii NOT DETECTED NOT DETECTED Final   Bacteroides fragilis NOT DETECTED  NOT DETECTED Final   Enterobacterales NOT DETECTED NOT DETECTED Final   Enterobacter cloacae complex NOT DETECTED NOT DETECTED Final   Escherichia coli NOT DETECTED NOT DETECTED Final   Klebsiella aerogenes NOT DETECTED NOT DETECTED Final   Klebsiella oxytoca NOT DETECTED NOT DETECTED Final   Klebsiella pneumoniae NOT DETECTED NOT DETECTED Final   Proteus species NOT DETECTED NOT DETECTED Final   Salmonella species NOT DETECTED NOT DETECTED Final   Serratia marcescens NOT DETECTED NOT DETECTED Final   Haemophilus influenzae NOT DETECTED NOT DETECTED Final   Neisseria meningitidis NOT DETECTED NOT DETECTED Final   Pseudomonas aeruginosa DETECTED (A) NOT DETECTED Final    Comment: CRITICAL RESULT CALLED TO, READ BACK BY AND VERIFIED WITH: DEVAN MITCHELL PHARMD 1300 08/13/21 HNM    Stenotrophomonas maltophilia NOT DETECTED NOT DETECTED Final   Candida albicans NOT DETECTED NOT DETECTED Final   Candida auris NOT DETECTED NOT DETECTED Final   Candida glabrata NOT DETECTED NOT DETECTED Final   Candida krusei NOT DETECTED NOT DETECTED Final   Candida parapsilosis NOT DETECTED NOT DETECTED Final   Candida tropicalis NOT DETECTED NOT DETECTED Final   Cryptococcus neoformans/gattii NOT DETECTED NOT DETECTED Final   CTX-M ESBL NOT DETECTED NOT DETECTED Final   Carbapenem resistance IMP NOT DETECTED NOT DETECTED Final   Carbapenem  resistance KPC NOT DETECTED NOT DETECTED Final   Carbapenem resistance NDM NOT DETECTED NOT DETECTED Final   Carbapenem resistance VIM NOT DETECTED NOT DETECTED Final    Comment: Performed at [facility], [address] [place], [place] [address]  Culture, blood (Routine x 2)     Status: None (Preliminary result)   Collection Time: 08/12/21  1:00 PM   Specimen: BLOOD  Result Value Ref Range Status   Specimen Description BLOOD LEFT AC  Final   Special Requests   Final    BOTTLES DRAWN AEROBIC AND ANAEROBIC Blood Culture results may not be optimal due to an excessive volume of blood received in culture bottles   Culture   Final    NO GROWTH < 24 HOURS Performed at [facility], [address], [place], [place] [address]    Report Status PENDING  Incomplete    IMAGING RESULTS: 05/29/21   08/07/21  Right infrahilar mass with associated Postobstructive atelectasis  I have personally reviewed the films ? PORT in place- concern for rt middle lobe pneumonia Impression/Recommendation ? Febrile neutropenia  Small cell ca lung recevied 1st dose of checmo 7/24- combination of carbo+etoposide+tecentriq Pseudomonas bacteremia- Do not think the port is the source. Could be lung but has had post obs pneumonia and mass since may 2023 Has a wound on the philtrum ( covered with scab) ?ecthyma- ? HSV  ? Radiation related On cefepime-await susceptibility- if susceptible to quinolone that can be an option as it is PO and patient says he wants to go to work ASAP Will need for minimum 2-3 weeks repeat blood culture tomorrow  Will do a 2 d echo  Port in place  Also has rt mass lung with post obstructive pneumonia But that has been present since May 2023  Abnormal LFT  Small cell carcinoma rt lung with mets to brain, intraabdominal including adrenals, omentum ,mesentery and pancreas ? 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 Oncology team to discuss with him  [facility] will be covering frid/sat/sun Dr.Snider available  by phone for urgent issues ___ Note:  This document was prepared using Dragon voice recognition software and may include unintentional dictation errors.

## 2021-08-14 ENCOUNTER — Inpatient Hospital Stay (HOSPITAL_COMMUNITY)
Admit: 2021-08-14 | Discharge: 2021-08-14 | Disposition: A | Discharge status: Home / Self Care | Payer: Medicare HMO | Attending: Infectious Diseases | Admitting: Infectious Diseases

## 2021-08-14 DIAGNOSIS — R7881 Bacteremia: Secondary | ICD-10-CM

## 2021-08-14 DIAGNOSIS — D709 Neutropenia, unspecified: Secondary | ICD-10-CM | POA: Diagnosis not present

## 2021-08-14 DIAGNOSIS — Z515 Encounter for palliative care: Secondary | ICD-10-CM

## 2021-08-14 DIAGNOSIS — R5081 Fever presenting with conditions classified elsewhere: Secondary | ICD-10-CM | POA: Diagnosis not present

## 2021-08-14 LAB — BASIC METABOLIC PANEL
Anion gap: 6 (ref 5–15)
BUN: 11 mg/dL (ref 8–23)
CO2: 27 mmol/L (ref 22–32)
Calcium: 8.8 mg/dL — ABNORMAL LOW (ref 8.9–10.3)
Chloride: 103 mmol/L (ref 98–111)
Creatinine, Ser: 0.65 mg/dL (ref 0.61–1.24)
GFR, Estimated: >60 mL/min (ref >60)
Glucose, Bld: 82 mg/dL (ref 70–99)
Potassium: 3.8 mmol/L (ref 3.5–5.1)
Sodium: 136 mmol/L (ref 135–145)

## 2021-08-14 LAB — ECHOCARDIOGRAM COMPLETE
AR max vel: 1.94 cm2
AV Area VTI: 2.13 cm2
AV Area mean vel: 2.21 cm2
AV Mean grad: 4 mmHg
AV Peak grad: 9.6 mmHg
Ao pk vel: 1.55 m/s
Area-P 1/2: 2.84 cm2
Height: 68 in
S' Lateral: 2.72 cm
Weight: 3340.41 oz

## 2021-08-14 LAB — CBC
HCT: 35.8 % — ABNORMAL LOW (ref 39.0–52.0)
Hemoglobin: 12.7 g/dL — ABNORMAL LOW (ref 13.0–17.0)
MCH: 31.8 pg (ref 26.0–34.0)
MCHC: 35.5 g/dL (ref 30.0–36.0)
MCV: 89.7 fL (ref 80.0–100.0)
Platelets: 39 10*3/uL — ABNORMAL LOW (ref 150–400)
RBC: 3.99 MIL/uL — ABNORMAL LOW (ref 4.22–5.81)
RDW: 12.3 % (ref 11.5–15.5)
WBC: 1.6 10*3/uL — ABNORMAL LOW (ref 4.0–10.5)
nRBC: 0 % (ref 0.0–0.2)

## 2021-08-14 LAB — URINALYSIS, ROUTINE W REFLEX MICROSCOPIC
Bilirubin Urine: NEGATIVE
Glucose, UA: NEGATIVE mg/dL
Hgb urine dipstick: NEGATIVE
Ketones, ur: NEGATIVE mg/dL
Leukocytes,Ua: NEGATIVE
Nitrite: NEGATIVE
Protein, ur: NEGATIVE mg/dL
Specific Gravity, Urine: 1.012 (ref 1.005–1.030)
pH: 6 (ref 5.0–8.0)

## 2021-08-14 LAB — PHOSPHORUS: Phosphorus: 3.2 mg/dL (ref 2.5–4.6)

## 2021-08-14 LAB — HSV 1/2 PCR (SURFACE)
HSV-1 DNA: DETECTED — AB
HSV-2 DNA: NOT DETECTED

## 2021-08-14 LAB — MAGNESIUM: Magnesium: 2 mg/dL (ref 1.7–2.4)

## 2021-08-14 MED ORDER — SENNA 8.6 MG PO TABS
1.0000 | ORAL_TABLET | Freq: Two times a day (BID) | ORAL | Status: DC
  Administered 2021-08-14 – 2021-08-15 (×3): 8.6 mg via ORAL
  Filled 2021-08-14 (×3): qty 1

## 2021-08-14 MED ORDER — VALACYCLOVIR HCL 500 MG PO TABS
1000.0000 mg | ORAL_TABLET | Freq: Every day | ORAL | Status: DC
  Administered 2021-08-14 – 2021-08-15 (×2): 1000 mg via ORAL
  Filled 2021-08-14 (×2): qty 2

## 2021-08-14 NOTE — Care Management Important Message (Signed)
Important Message  Patient Details  Name: Maurice Simpson MRN: [id number] Date of Birth: 11-Sep-1955   Medicare Important Message Given:  Yes     Juliann Pulse A Wajiha Versteeg 08/14/2021, 3:31 PM

## 2021-08-14 NOTE — Consult Note (Signed)
[place] at [facility] [phone] [phone]   Name: Maurice Simpson Date: 08/14/2021 MRN: [id number]  DOB: 07-07-1955  Patient Care Team: [name], NP as PCP - General (Nurse Practitioner) Telford Nab, RN as Oncology Nurse Navigator    REASON FOR CONSULTATION: Maurice Simpson is a 66 y.o. 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  Patient was admitted to the hospital on 08/12/2021 with neutropenic fever and found to have postobstructive pneumonia and Pseudomonas bacteremia.  Palliative care was consulted to address goals.  SOCIAL HISTORY:     reports that he has been smoking cigarettes. He has a 102.00 pack-year smoking history. He has never used smokeless tobacco. He reports that he does not currently use alcohol. He reports that he does not use drugs.  Patient is married and lives at home with his wife.  He owns a tree service.  ADVANCE DIRECTIVES:  Not on file  CODE STATUS: Full code  PAST MEDICAL HISTORY: Past Medical History:  Diagnosis Date   Cancer ([place])    COPD (chronic obstructive pulmonary disease) ([place])    Dyspnea     PAST SURGICAL HISTORY:  Past Surgical History:  Procedure Laterality Date   CATARACT EXTRACTION Right 03/10/2021   IR IMAGING GUIDED PORT INSERTION  07/27/2021    HEMATOLOGY/ONCOLOGY HISTORY:  Oncology History  Small cell lung cancer ([place])  07/21/2021 Initial Diagnosis   Small cell lung cancer ([place])   07/21/2021 Cancer Staging   Staging form: Lung, AJCC 8th Edition - Clinical stage from 07/21/2021: Stage IVB (cT2, cN0, pM1c) - Signed by Sindy Guadeloupe, MD on 07/21/2021   08/03/2021 -  Chemotherapy   Patient is on Treatment Plan : LUNG SCLC Carboplatin + Etoposide + Atezolizumab Induction q21d / Atezolizumab  Maintenance q21d       ALLERGIES:  is allergic to bee venom.  MEDICATIONS:  Current Facility-Administered Medications  Medication Dose Route Frequency Provider Last Rate Last Admin   0.9 %  sodium chloride infusion   Intravenous PRN Agbata, Tochukwu, MD 10 mL/hr at 08/13/21 2159 New Bag at 08/13/21 2159   acetaminophen (TYLENOL) tablet 650 mg  650 mg Oral Q6H PRN Agbata, Tochukwu, MD   650 mg at 08/14/21 0855   Or   acetaminophen (TYLENOL) suppository 650 mg  650 mg Rectal Q6H PRN Agbata, Tochukwu, MD       albuterol (PROVENTIL) (2.5 MG/3ML) 0.083% nebulizer solution 2.5 mg  2.5 mg Nebulization Q6H PRN Agbata, Tochukwu, MD       arformoterol (BROVANA) nebulizer solution 15 mcg  15 mcg Nebulization BID Agbata, Tochukwu, MD   15 mcg at 08/14/21 0729   And   umeclidinium bromide (INCRUSE ELLIPTA) 62.5 MCG/ACT 1 puff  1 puff Inhalation Daily Agbata, Tochukwu, MD   1 puff at 08/14/21 0856   budesonide (PULMICORT) nebulizer solution 0.25 mg  0.25 mg Nebulization BID Agbata, Tochukwu, MD   0.25 mg at 08/14/21 0729   ceFEPIme (MAXIPIME) 2 g in sodium chloride 0.9 % 100 mL IVPB  2 g Intravenous Q8H Mickeal Skinner A, RPH 200 mL/hr at 08/14/21 0519 2 g at 08/14/21 0519   Chlorhexidine Gluconate Cloth 2 % PADS 6 each  6 each Topical Daily Agbata, Tochukwu, MD   6 each at 08/14/21 1142   dexamethasone (DECADRON) tablet 4 mg  4 mg Oral Daily Agbata, Tochukwu, MD   4 mg at 08/14/21 0855   magic mouthwash w/lidocaine  5 mL Oral QID PRN Agbata, Tochukwu, MD   5 mL at 08/14/21 0901   ondansetron (ZOFRAN) tablet 4 mg  4 mg Oral Q6H PRN Agbata, Tochukwu, MD   4 mg at 08/13/21 0943   Or   ondansetron (ZOFRAN) injection 4 mg  4 mg Intravenous Q6H PRN Agbata, Tochukwu, MD   4 mg at 08/12/21 1816   oxyCODONE (Oxy IR/ROXICODONE) immediate release tablet 5 mg  5 mg Oral Q4H PRN Agbata, Tochukwu, MD   5 mg at 08/13/21 1359   pantoprazole (PROTONIX) EC tablet 20 mg  20 mg Oral Daily Agbata, Tochukwu, MD   20 mg at  08/14/21 0855   senna (SENOKOT) tablet 8.6 mg  1 tablet Oral BID Shawna Clamp, MD   8.6 mg at 08/14/21 0855   traZODone (DESYREL) tablet 50 mg  50 mg Oral QHS PRN Agbata, Tochukwu, MD        VITAL SIGNS: BP 131/70 (BP Location: Right Arm)   Pulse 83   Temp 98.2 F (36.8 C) (Oral)   Resp 18   Ht 5\' 8"  (1.727 m)   Wt 208 lb 12.4 oz (94.7 kg)   SpO2 92%   BMI 31.74 kg/m  Filed Weights   08/12/21 1244 08/13/21 0600  Weight: 211 lb (95.7 kg) 208 lb 12.4 oz (94.7 kg)    Estimated body mass index is 31.74 kg/m as calculated from the following:   Height as of this encounter: 5\' 8"  (1.727 m).   Weight as of this encounter: 208 lb 12.4 oz (94.7 kg).  LABS: CBC:    Component Value Date/Time   WBC 1.6 (L) 08/14/2021 0520   HGB 12.7 (L) 08/14/2021 0520   HGB 18.0 (H) 04/30/2021 1136   HCT 35.8 (L) 08/14/2021 0520   HCT 53.3 (H) 04/30/2021 1136   PLT 39 (L) 08/14/2021 0520   PLT 156 04/30/2021 1136   MCV 89.7 08/14/2021 0520   MCV 91 04/30/2021 1136   MCV 94 01/21/2011 0134   NEUTROABS 0.0 (LL) 08/12/2021 1041   NEUTROABS 4.8 04/30/2021 1136   LYMPHSABS 0.6 (L) 08/12/2021 1041   LYMPHSABS 2.9 04/30/2021 1136   MONOABS 0.0 (L) 08/12/2021 1041   EOSABS 0.0 08/12/2021 1041   EOSABS 0.3 04/30/2021 1136   BASOSABS 0.0 08/12/2021 1041   BASOSABS 0.1 04/30/2021 1136   Comprehensive Metabolic Panel:    Component Value Date/Time   NA 136 08/14/2021 0520   NA 136 04/30/2021 1136   NA 139 01/21/2011 0134   K 3.8 08/14/2021 0520   K 3.8 01/21/2011 0134   CL 103 08/14/2021 0520   CL 105 01/21/2011 0134   CO2 27 08/14/2021 0520   CO2 23 01/21/2011 0134   BUN 11 08/14/2021 0520   BUN 12 04/30/2021 1136   BUN 15 01/21/2011 0134   CREATININE 0.65 08/14/2021 0520   CREATININE 0.92 01/21/2011 0134   GLUCOSE 82 08/14/2021 0520   GLUCOSE 118 (H) 01/21/2011 0134   CALCIUM 8.8 (L) 08/14/2021 0520   CALCIUM 9.1 01/21/2011 0134   AST 35 08/12/2021 1041   AST 20 01/21/2011 0134    ALT 81 (H) 08/12/2021 1041   ALT 29 01/21/2011 0134   ALKPHOS 77 08/12/2021 1041   ALKPHOS 46 (L) 01/21/2011 0134   BILITOT 2.0 (H) 08/12/2021 1041   BILITOT 0.8 04/30/2021 1136   BILITOT 0.8 01/21/2011 0134  PROT 7.4 08/12/2021 1041   PROT 7.2 04/30/2021 1136   PROT 7.4 01/21/2011 0134   ALBUMIN 3.8 08/12/2021 1041   ALBUMIN 4.3 04/30/2021 1136   ALBUMIN 3.8 01/21/2011 0134    RADIOGRAPHIC STUDIES: DG Chest 2 View  Result Date: 08/12/2021 CLINICAL DATA:  Sepsis, syncope, short of breath EXAM: CHEST - 2 VIEW COMPARISON:  04/30/2021 FINDINGS: 3 Port in the anterior chest wall with tip in distal SVC. Normal mediastinum and cardiac silhouette. Normal pulmonary vasculature Density over the heart on lateral projection is favored RIGHT middle lobe opacity. No pleural fluid IMPRESSION: Concern for RIGHT middle lobe pneumonia or atelectasis Electronically Signed   By: Suzy Bouchard M.D.   On: 08/12/2021 13:17   CT Angio Chest Pulmonary Embolism (PE) W or WO Contrast  Result Date: 08/07/2021 CLINICAL DATA:  Chest pain. EXAM: CT ANGIOGRAPHY CHEST WITH CONTRAST TECHNIQUE: Multidetector CT imaging of the chest was performed using the standard protocol during bolus administration of intravenous contrast. Multiplanar CT image reconstructions and MIPs were obtained to evaluate the vascular anatomy. RADIATION DOSE REDUCTION: This exam was performed according to the departmental dose-optimization program which includes automated exposure control, adjustment of the mA and/or kV according to patient size and/or use of iterative reconstruction technique. CONTRAST:  6mL OMNIPAQUE IOHEXOL 350 MG/ML SOLN COMPARISON:  May 29, 2021. FINDINGS: Cardiovascular: Satisfactory opacification of the pulmonary arteries to the segmental level. No evidence of pulmonary embolism. Normal heart size. No pericardial effusion. Mediastinum/Nodes: No enlarged mediastinal, hilar, or axillary lymph nodes. Thyroid gland, trachea, and  esophagus demonstrate no significant findings. Lungs/Pleura: No pneumothorax or pleural effusion is noted. 5.5 x 3.8 cm right infrahilar mass is again noted consistent with malignancy. Mild postobstructive atelectasis is noted. Upper Abdomen: Bilateral adrenal masses are noted concerning for metastatic disease. Musculoskeletal: No chest wall abnormality. No acute or significant osseous findings. Review of the MIP images confirms the above findings. IMPRESSION: No definite evidence of pulmonary embolus. 5.5 x 3.8 cm right infrahilar mass is noted consistent with malignancy with associated postobstructive atelectasis. Bilateral adrenal masses are noted concerning for metastatic disease. Electronically Signed   By: Marijo Conception M.D.   On: 08/07/2021 16:17   IR IMAGING GUIDED PORT INSERTION  Result Date: 07/27/2021 CLINICAL DATA:  Metastatic small cell lung carcinoma and need for porta cath for chemotherapy. EXAM: IMPLANTED PORT A CATH PLACEMENT WITH ULTRASOUND AND FLUOROSCOPIC GUIDANCE ANESTHESIA/SEDATION: Moderate (conscious) sedation was employed during this procedure. A total of Versed 2.0 mg and Fentanyl 100 mcg was administered intravenously by radiology nursing. Moderate Sedation Time: 20 minutes. The patient's level of consciousness and vital signs were monitored continuously by radiology nursing throughout the procedure under my direct supervision. FLUOROSCOPY: 36 seconds.  11.4 mGy. PROCEDURE: The procedure, risks, benefits, and alternatives were explained to the patient. Questions regarding the procedure were encouraged and answered. The patient understands and consents to the procedure. A time-out was performed prior to initiating the procedure. Ultrasound was utilized to confirm patency of the right internal jugular vein. A permanent ultrasound image was recorded. The right neck and chest were prepped with chlorhexidine in a sterile fashion, and a sterile drape was applied covering the operative  field. Maximum barrier sterile technique with sterile gowns and gloves were used for the procedure. Local anesthesia was provided with 1% lidocaine. After creating a small venotomy incision, a 21 gauge needle was advanced into the right internal jugular vein under direct, real-time ultrasound guidance. Ultrasound image documentation was performed. After securing guidewire  access, an 8 Fr dilator was placed. A J-wire was kinked to measure appropriate catheter length. A subcutaneous port pocket was then created along the upper chest wall utilizing sharp and blunt dissection. Portable cautery was utilized. The pocket was irrigated with sterile saline. A single lumen power injectable port was chosen for placement. The 8 Fr catheter was tunneled from the port pocket site to the venotomy incision. The port was placed in the pocket. External catheter was trimmed to appropriate length based on guidewire measurement. At the venotomy, an 8 Fr peel-away sheath was placed over a guidewire. The catheter was then placed through the sheath and the sheath removed. Final catheter positioning was confirmed and documented with a fluoroscopic spot image. The port was accessed with a needle and aspirated and flushed with heparinized saline. The access needle was removed. The venotomy and port pocket incisions were closed with subcutaneous 3-0 Monocryl and subcuticular 4-0 Vicryl. Dermabond was applied to both incisions. COMPLICATIONS: COMPLICATIONS None FINDINGS: After catheter placement, the tip lies at the cavo-atrial junction. The catheter aspirates normally and is ready for immediate use. IMPRESSION: Placement of single lumen port a cath via right internal jugular vein. The catheter tip lies at the cavo-atrial junction. A power injectable port a cath was placed and is ready for immediate use. Electronically Signed   By: Aletta Edouard M.D.   On: 07/27/2021 12:58    PERFORMANCE STATUS (ECOG) : 1 - Symptomatic but completely  ambulatory  Review of Systems Unless otherwise noted, a complete review of systems is negative.  Physical Exam General: NAD Pulmonary: Unlabored Extremities: no edema, no joint deformities Skin: no rashes Neurological: Weakness but otherwise nonfocal  IMPRESSION: Patient seen by me in [facility] on 08/12/2021 at which time he was sent to the hospital for neutropenic fever.  Work-up consistent with postobstructive pneumonia and Pseudomonas bacteremia.  Patient is on IV antibiotics and is being followed by ID.  He is now afebrile and reportedly feels significantly improved.  Patient is frustrated and says that he leave the hospital AMA if he is not soon discharged.  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  I will plan to follow-up outpatient in the clinic for further discussions regarding goals.  PLAN: -Continue current scope of treatment -Plan outpatient follow-up   Time Total: 30 minutes  Visit consisted of counseling and education dealing with the complex and emotionally intense issues of symptom management and palliative care in the setting of serious and potentially life-threatening illness.Greater than 50%  of this time was spent counseling and coordinating care related to the above assessment and plan.  Signed by: Altha Harm, PhD, NP-C

## 2021-08-14 NOTE — Progress Notes (Signed)
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 Patient was recently diagnosed with small cell carcinoma in June 2023 and has just completed his first cycle of chemotherapy 1 week prior to his admission.  Labs pertinent include lactic acidosis, neutropenia, thrombocytopenia.  Imaging is suggestive of right middle lobe pneumonia.  Patient is admitted for postobstructive pneumonia,  started on IV antibiotics.  Oncology is consulted.  Blood cultures positive for Pseudomonas.  Infectious diseases consulted.  Patient wants to be discharged.  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  Awaiting blood culture sensitivity to find out if antibiotics can be changed to oral before discharge.  Assessment & Plan:   Principal Problem:   Febrile neutropenia (HCC) Active Problems:   Sepsis ([place])   Postobstructive pneumonia   Small cell lung cancer (HCC)   COPD (chronic obstructive pulmonary disease) (HCC)   Thrombocytopenia (HCC)   Palliative care encounter  Febrile neutropenia: Patient with history of metastatic small cell lung cancer, who has recently completed his first cycle of chemotherapy and presented for the evaluation of fever. He is found to be neutropenic with a white cell count of 0.7 and ANC of 210. Patient has recently received Neulasta as an outpatient. WBC improved to 1.6. Continued empiric antibiotic therapy with vancomycin and cefepime. Blood cultures grew Pseudomonas,  vancomycin discontinued. Infectious diseases consulted, continue cefepime for now. Awaiting blood culture sensitivity to check if antibiotic can be changed to oral before discharge. Echocardiogram completed, report is pending.  Patient remains afebrile.  Sepsis: Patient presented with fever with a Tmax of 101, tachycardia, tachypnea, lactic acidosis, neutropenia and imaging suggestive of right middle lobe pneumonia. Initiated empiric antibiotic therapy with ( vancomycin and cefepime ). Vancomycin discontinued, continue cefepime for now. Lactic acid has improved with IV hydration. Sepsis physiology improving.  Postobstructive pneumonia: Likely secondary to malignancy. Continue IV antibiotics( Cefepime)  Small cell lung cancer: Patient was diagnosed in June 2023 with metastatic small cell lung cancer involving right lung,  He is s/p first cycle of chemotherapy as well as right brain radiation treatment for metastatic disease to the brain.   Oncology consult Dr. Janese Banks was notified.  Patient had recently received Neulasta as an outpatient, therefore does not require Neupogen.  Continue outpatient follow-up.  COPD: Continue as needed bronchodilator therapy and as well as inhaled steroid.   Not in an acute exacerbation.  Thrombocytopenia: Likely secondary to chemotherapy. There is no evidence of any active bleeding. Closely monitor during hospitalization.  DVT prophylaxis: SCDs Code Status: Full code. Family Communication: Wife at bed side. Disposition Plan:  Status is: Inpatient Remains inpatient appropriate because: Admitted for febrile neutropenia and sepsis secondary to postobstructive pneumonia requiring IV antibiotics.  Blood cultures grew Pseudomonas.  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  Awaiting culture and sensitivity to check if patient can be discharged on oral antibiotics.   Consultants:   Oncology Infectious diseases  Procedures: None Antimicrobials: Vancomycin  Cefepime  Subjective: Patient was seen and examined at bedside.  Overnight events noted.  Patient reports feeling better. Patient stated he has improved, feeling much better,  reports cough has improved, he feels more energy. 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 Objective: Vitals:   08/13/21 2331 08/14/21 0338 08/14/21 0745 08/14/21 1143  BP: 114/72 112/69 110/63 131/70  Pulse: 78 76 83 83  Resp: 20 19  18   Temp: 98.1 F (36.7 C) 97.7 F (36.5 C) 98.1 F (36.7 C) 98.2 F (36.8 C)  TempSrc: Oral Oral  Oral  SpO2: 91% 94% 93% 92%  Weight:      Height:        Intake/Output Summary (Last 24 hours) at 08/14/2021 1300 Last data filed at 08/14/2021 1001 Gross per 24 hour  Intake 684.66 ml  Output 1525 ml  Net -840.34 ml   Filed Weights   08/12/21 1244 08/13/21 0600  Weight: 95.7 kg 94.7 kg    Examination:  General exam: Appears comfortable, not in any acute distress, deconditioned Respiratory system: Decreased breath sounds bilaterally, no wheezing, normal respiratory effort. Cardiovascular system: S1 & S2 heard, regular rate and rhythm, no murmur. Gastrointestinal system: Abdomen is soft, non tender, non distended, BS+ Central nervous system: Alert and oriented x3. No focal neurological deficits. Extremities: Edema+, no cyanosis, no clubbing. Skin: No rashes, lesions or ulcers Psychiatry: Mood depressed, affect flat, denies suicidal homicidal ideation.    Data Reviewed: I have personally reviewed following labs and imaging studies  CBC: Recent Labs  Lab 08/07/21 1501 08/12/21 1041 08/13/21 0503 08/14/21 0520  WBC 7.7 0.7* 0.7* 1.6*  NEUTROABS 6.8 0.0*  --   --   HGB 16.5 15.7 11.8* 12.7*  HCT 47.4 45.0 33.6* 35.8*  MCV 91.3 91.6 88.9 89.7  PLT 134* 46* 38* 39*   Basic Metabolic Panel: Recent Labs  Lab 08/07/21 1501  08/12/21 1041 08/13/21 0503 08/14/21 0520  NA 135 133* 133* 136  K 4.8 4.1 4.0 3.8  CL 98 97* 104 103  CO2 26 26 22 27   GLUCOSE 171* 172* 127* 82  BUN 23 19 13 11   CREATININE 0.87 0.84 0.81 0.65  CALCIUM 9.3 9.1 8.5* 8.8*  MG  --   --   --  2.0  PHOS  --   --   --  3.2   GFR: Estimated Creatinine Clearance: 101.4 mL/min (by C-G formula based on SCr of 0.65 mg/dL). Liver Function Tests: Recent Labs  Lab 08/07/21 1501 08/12/21 1041  AST 82* 35  ALT 92* 81*  ALKPHOS 62 77  BILITOT 1.8* 2.0*  PROT 7.2 7.4  ALBUMIN 3.6 3.8   No results for input(s): "LIPASE", "AMYLASE" in the last 168 hours. No results for input(s): "AMMONIA" in the last 168 hours. Coagulation Profile: Recent Labs  Lab 08/12/21 1300 08/13/21 0503  INR 1.0 1.1   Cardiac Enzymes: No results for input(s): "CKTOTAL", "CKMB", "CKMBINDEX", "TROPONINI" in the last 168 hours. BNP (last 3 results) No results for input(s): "PROBNP" in the last 8760 hours. HbA1C: No results for input(s): "HGBA1C" in the last 72 hours. CBG: No results for input(s): "GLUCAP" in the last 168 hours. Lipid Profile: No results for input(s): "CHOL", "HDL", "LDLCALC", "TRIG", "CHOLHDL", "LDLDIRECT" in the last 72 hours. Thyroid Function Tests: No results for input(s): "TSH", "T4TOTAL", "FREET4", "T3FREE", "THYROIDAB" in the last 72 hours. Anemia Panel: No results for input(s): "VITAMINB12", "FOLATE", "FERRITIN", "  TIBC", "IRON", "RETICCTPCT" in the last 72 hours. Sepsis Labs: Recent Labs  Lab 08/12/21 1300 08/12/21 1820 08/13/21 0503  PROCALCITON  --   --  0.20  LATICACIDVEN 3.2* 1.8  --     Recent Results (from the past 240 hour(s))  Culture, blood (Routine x 2)     Status: Abnormal (Preliminary result)   Collection Time: 08/12/21 12:58 PM   Specimen: BLOOD  Result Value Ref Range Status   Specimen Description   Final    BLOOD PORT Performed at [facility], [address] [place], [place] [address]     Special Requests   Final    BOTTLES DRAWN AEROBIC AND ANAEROBIC Blood Culture results may not be optimal due to an excessive volume of blood received in culture bottles Performed at [facility], [address] [place], [place] [address]    Culture  Setup Time   Final    GRAM NEGATIVE RODS ANAEROBIC BOTTLE ONLY CRITICAL RESULT CALLED TO, READ BACK BY AND VERIFIED WITH: DEVAN MITCHELL PHARMD 1300 08/13/21 HNM    Culture (A)  Final    PSEUDOMONAS AERUGINOSA SUSCEPTIBILITIES TO FOLLOW Performed at [facility], [place] [address], [place], [place] [address]    Report Status PENDING  Incomplete  Blood Culture ID Panel (Reflexed)     Status: Abnormal   Collection Time: 08/12/21 12:58 PM  Result Value Ref Range Status   Enterococcus faecalis NOT DETECTED NOT DETECTED Final   Enterococcus Faecium NOT DETECTED NOT DETECTED Final   Listeria monocytogenes NOT DETECTED NOT DETECTED Final   Staphylococcus species NOT DETECTED NOT DETECTED Final   Staphylococcus aureus (BCID) NOT DETECTED NOT DETECTED Final   Staphylococcus epidermidis NOT DETECTED NOT DETECTED Final   Staphylococcus lugdunensis NOT DETECTED NOT DETECTED Final   Streptococcus species NOT DETECTED NOT DETECTED Final   Streptococcus agalactiae NOT DETECTED NOT DETECTED Final   Streptococcus pneumoniae NOT DETECTED NOT DETECTED Final   Streptococcus pyogenes NOT DETECTED NOT DETECTED Final   A.calcoaceticus-baumannii NOT DETECTED NOT DETECTED Final   Bacteroides fragilis NOT DETECTED NOT DETECTED Final   Enterobacterales NOT DETECTED NOT DETECTED Final   Enterobacter cloacae complex NOT DETECTED NOT DETECTED Final   Escherichia coli NOT DETECTED NOT DETECTED Final   Klebsiella aerogenes NOT DETECTED NOT DETECTED Final   Klebsiella oxytoca NOT DETECTED NOT DETECTED Final   Klebsiella pneumoniae NOT DETECTED NOT DETECTED Final   Proteus species NOT DETECTED NOT DETECTED Final   Salmonella species NOT DETECTED NOT  DETECTED Final   Serratia marcescens NOT DETECTED NOT DETECTED Final   Haemophilus influenzae NOT DETECTED NOT DETECTED Final   Neisseria meningitidis NOT DETECTED NOT DETECTED Final   Pseudomonas aeruginosa DETECTED (A) NOT DETECTED Final    Comment: CRITICAL RESULT CALLED TO, READ BACK BY AND VERIFIED WITH: DEVAN MITCHELL PHARMD 1300 08/13/21 HNM    Stenotrophomonas maltophilia NOT DETECTED NOT DETECTED Final   Candida albicans NOT DETECTED NOT DETECTED Final   Candida auris NOT DETECTED NOT DETECTED Final   Candida glabrata NOT DETECTED NOT DETECTED Final   Candida krusei NOT DETECTED NOT DETECTED Final   Candida parapsilosis NOT DETECTED NOT DETECTED Final   Candida tropicalis NOT DETECTED NOT DETECTED Final   Cryptococcus neoformans/gattii NOT DETECTED NOT DETECTED Final   CTX-M ESBL NOT DETECTED NOT DETECTED Final   Carbapenem resistance IMP NOT DETECTED NOT DETECTED Final   Carbapenem resistance KPC NOT DETECTED NOT DETECTED Final   Carbapenem resistance NDM NOT DETECTED NOT  DETECTED Final   Carbapenem resistance VIM NOT DETECTED NOT DETECTED Final    Comment: Performed at [facility], [address] [place], [place] [address]  Culture, blood (Routine x 2)     Status: None (Preliminary result)   Collection Time: 08/12/21  1:00 PM   Specimen: BLOOD  Result Value Ref Range Status   Specimen Description BLOOD LEFT AC  Final   Special Requests   Final    BOTTLES DRAWN AEROBIC AND ANAEROBIC Blood Culture results may not be optimal due to an excessive volume of blood received in culture bottles   Culture   Final    NO GROWTH 2 DAYS Performed at [facility], [address] [place], [place] [address]    Report Status PENDING  Incomplete    Radiology Studies: DG Chest 2 View  Result Date: 08/12/2021 CLINICAL DATA:  Sepsis, syncope, short of breath EXAM: CHEST - 2 VIEW COMPARISON:  04/30/2021 FINDINGS: 3 Port in the anterior chest wall with tip in distal SVC.  Normal mediastinum and cardiac silhouette. Normal pulmonary vasculature Density over the heart on lateral projection is favored RIGHT middle lobe opacity. No pleural fluid IMPRESSION: Concern for RIGHT middle lobe pneumonia or atelectasis Electronically Signed   By: Suzy Bouchard M.D.   On: 08/12/2021 13:17     Scheduled Meds:  arformoterol  15 mcg Nebulization BID   And   umeclidinium bromide  1 puff Inhalation Daily   budesonide  0.25 mg Nebulization BID   Chlorhexidine Gluconate Cloth  6 each Topical Daily   dexamethasone  4 mg Oral Daily   pantoprazole  20 mg Oral Daily   senna  1 tablet Oral BID   Continuous Infusions:  sodium chloride 10 mL/hr at 08/13/21 2159   ceFEPime (MAXIPIME) IV 2 g (08/14/21 0519)     LOS: 2 days    Time spent: 35 Mins    Favor Kreh, MD [facility]   If 7PM-7AM, please contact night-coverage

## 2021-08-14 NOTE — Progress Notes (Signed)
*  PRELIMINARY RESULTS* Echocardiogram 2D Echocardiogram has been performed.  Maurice Simpson 08/14/2021, 11:44 AM

## 2021-08-15 DIAGNOSIS — R5081 Fever presenting with conditions classified elsewhere: Secondary | ICD-10-CM | POA: Diagnosis not present

## 2021-08-15 DIAGNOSIS — D709 Neutropenia, unspecified: Secondary | ICD-10-CM | POA: Diagnosis not present

## 2021-08-15 LAB — CULTURE, BLOOD (ROUTINE X 2)

## 2021-08-15 MED ORDER — HEPARIN SOD (PORK) LOCK FLUSH 100 UNIT/ML IV SOLN
500.0000 [IU] | Freq: Once | INTRAVENOUS | Status: AC
  Administered 2021-08-15: 500 [IU] via INTRAVENOUS
  Filled 2021-08-15: qty 5

## 2021-08-15 MED ORDER — VALACYCLOVIR HCL 1 G PO TABS: 1000.0000 mg | ORAL_TABLET | Freq: Every day | ORAL | 10 refills | Status: DC

## 2021-08-15 MED ORDER — CIPROFLOXACIN HCL 500 MG PO TABS: 500.0000 mg | ORAL_TABLET | Freq: Two times a day (BID) | ORAL | 0 refills | Status: DC

## 2021-08-15 MED ORDER — CIPROFLOXACIN HCL 750 MG PO TABS: 750.0000 mg | ORAL_TABLET | Freq: Two times a day (BID) | ORAL | 0 refills | Status: AC

## 2021-08-15 NOTE — Discharge Instructions (Addendum)
Advised to follow-up with primary care physician in 1 week. Advised to follow-up with infectious disease specialist 2 weeks. Advised to take ciprofloxacin 750 mg twice daily for 3 weeks for Pseudomonas bacteremia. Advised to follow-up with Dr. Randa Evens for pancytopenia.

## 2021-08-15 NOTE — TOC CM/SW Note (Signed)
Patient has orders to discharge home today. Chart reviewed. PCP is Marnee Guarneri, NP. On room air. No discharge wound care needs. No TOC needs identified. CSW signing off.  Maurice Simpson, [name]

## 2021-08-15 NOTE — Progress Notes (Signed)
Maurice Simpson   DOB:11-01-1955   QH#:[id number]    Subjective: Patient resting in the bed.  No fever/chills.  No nausea no vomiting.  Eager to go home.  Objective:  Vitals:   08/15/21 0746 08/15/21 1115  BP: 117/80 134/71  Pulse: 75 82  Resp: 18 18  Temp: 98.1 F (36.7 C) 98.3 F (36.8 C)  SpO2: 91% 92%     Intake/Output Summary (Last 24 hours) at 08/15/2021 2159 Last data filed at 08/15/2021 1116 Gross per 24 hour  Intake 731.55 ml  Output 300 ml  Net 431.55 ml    Physical Exam Vitals and nursing note reviewed.  HENT:     Head: Normocephalic and atraumatic.     Mouth/Throat:     Pharynx: Oropharynx is clear.  Eyes:     Extraocular Movements: Extraocular movements intact.     Pupils: Pupils are equal, round, and reactive to light.  Cardiovascular:     Rate and Rhythm: Normal rate and regular rhythm.  Pulmonary:     Comments: Decreased breath sounds bilaterally.  Abdominal:     Palpations: Abdomen is soft.  Musculoskeletal:        General: Normal range of motion.     Cervical back: Normal range of motion.  Skin:    General: Skin is warm.  Neurological:     General: No focal deficit present.     Mental Status: He is alert and oriented to person, place, and time.  Psychiatric:        Behavior: Behavior normal.        Judgment: Judgment normal.       Labs:  Lab Results  Component Value Date   WBC 1.6 (L) 08/14/2021   HGB 12.7 (L) 08/14/2021   HCT 35.8 (L) 08/14/2021   MCV 89.7 08/14/2021   PLT 39 (L) 08/14/2021   NEUTROABS 0.0 (LL) 08/12/2021    Lab Results  Component Value Date   NA 136 08/14/2021   K 3.8 08/14/2021   CL 103 08/14/2021   CO2 27 08/14/2021    Studies:  ECHOCARDIOGRAM COMPLETE  Result Date: 08/14/2021    ECHOCARDIOGRAM REPORT   Patient Name:   Kalab IBRAHIMA HOLBERG Date of Exam: 08/14/2021 Medical Rec #:  [id number]    Height:       68.0 in Accession #:    [id number]   Weight:       208.8 lb Date of Birth:  07-Sep-1955    BSA:          2.081 m Patient  Age:    66 years     BP:           110/63 mmHg Patient Gender: M            HR:           80 bpm. Exam Location:  [facility] Procedure: 2D Echo, Color Doppler and Cardiac Doppler Indications:     R78.81 Bacteremia  History:         Patient has prior history of Echocardiogram examinations, most                  recent 06/17/2021. COPD, Signs/Symptoms:Fever; Risk                  Factors:Current Smoker.  Sonographer:     Charmayne Sheer Referring Phys:  [id number] Tsosie Billing Diagnosing Phys: Nelva Bush MD  Sonographer Comments: Technically difficult study due to poor echo windows. Image acquisition challenging due  to patient body habitus and Image acquisition challenging due to COPD. IMPRESSIONS  1. Left ventricular ejection fraction, by estimation, is 60 to 65%. The left ventricle has normal function. Left ventricular endocardial border not optimally defined to evaluate regional wall motion. Left ventricular diastolic parameters were normal.  2. Right ventricular systolic function is normal. The right ventricular size is normal.  3. The mitral valve is grossly normal. Trivial mitral valve regurgitation. No evidence of mitral stenosis.  4. The aortic valve was not well visualized. Aortic valve regurgitation is not visualized. No aortic stenosis is present.  5. The inferior vena cava is normal in size with greater than 50% respiratory variability, suggesting right atrial pressure of 3 mmHg. FINDINGS  Left Ventricle: Left ventricular ejection fraction, by estimation, is 60 to 65%. The left ventricle has normal function. Left ventricular endocardial border not optimally defined to evaluate regional wall motion. The left ventricular internal cavity size was normal in size. There is no left ventricular hypertrophy. Left ventricular diastolic parameters were normal. Right Ventricle: The right ventricular size is normal. No increase in right ventricular wall thickness. Right ventricular systolic function is normal. Left  Atrium: Left atrial size was normal in size. Right Atrium: Right atrial size was normal in size. Pericardium: There is no evidence of pericardial effusion. Mitral Valve: The mitral valve is grossly normal. Trivial mitral valve regurgitation. No evidence of mitral valve stenosis. Tricuspid Valve: The tricuspid valve is normal in structure. Tricuspid valve regurgitation is not demonstrated. Aortic Valve: The aortic valve was not well visualized. Aortic valve regurgitation is not visualized. No aortic stenosis is present. Aortic valve mean gradient measures 4.0 mmHg. Aortic valve peak gradient measures 9.6 mmHg. Aortic valve area, by VTI measures 2.13 cm. Pulmonic Valve: The pulmonic valve was not well visualized. Pulmonic valve regurgitation is not visualized. No evidence of pulmonic stenosis. Aorta: The aortic root is normal in size and structure. Pulmonary Artery: The pulmonary artery is not well seen. Venous: The inferior vena cava is normal in size with greater than 50% respiratory variability, suggesting right atrial pressure of 3 mmHg. IAS/Shunts: The interatrial septum was not well visualized.  LEFT VENTRICLE PLAX 2D LVIDd:         4.57 cm   Diastology LVIDs:         2.72 cm   LV e' medial:    7.83 cm/s LV PW:         0.94 cm   LV E/e' medial:  8.6 LV IVS:        0.96 cm   LV e' lateral:   9.90 cm/s LVOT diam:     1.90 cm   LV E/e' lateral: 6.8 LV SV:         58 LV SV Index:   28 LVOT Area:     2.84 cm  RIGHT VENTRICLE RV Basal diam:  3.20 cm LEFT ATRIUM             Index        RIGHT ATRIUM           Index LA diam:        2.70 cm 1.30 cm/m   RA Area:     10.30 cm LA Vol (A2C):   30.1 ml 14.46 ml/m  RA Volume:   18.40 ml  8.84 ml/m LA Vol (A4C):   32.5 ml 15.61 ml/m LA Biplane Vol: 31.8 ml 15.28 ml/m  AORTIC VALVE  PULMONIC VALVE AV Area (Vmax):    1.94 cm     PV Vmax:       0.90 m/s AV Area (Vmean):   2.21 cm     PV Peak grad:  3.2 mmHg AV Area (VTI):     2.13 cm AV Vmax:            155.00 cm/s AV Vmean:          93.400 cm/s AV VTI:            0.274 m AV Peak Grad:      9.6 mmHg AV Mean Grad:      4.0 mmHg LVOT Vmax:         106.00 cm/s LVOT Vmean:        72.900 cm/s LVOT VTI:          0.206 m LVOT/AV VTI ratio: 0.75  AORTA Ao Root diam: 3.50 cm MITRAL VALVE MV Area (PHT): 2.84 cm    SHUNTS MV Decel Time: 267 msec    Systemic VTI:  0.21 m MV E velocity: 67.30 cm/s  Systemic Diam: 1.90 cm MV A velocity: 73.70 cm/s MV E/A ratio:  0.91 Harrell Gave End MD Electronically signed by Nelva Bush MD Signature Date/Time: 08/14/2021/1:51:17 PM    Final     #66 year old male patient with extensive stage small cell lung cancer-currently status post chemoimmunotherapy is admitted hospital for neutropenic fever/sepsis  #Neutropenic fever/sepsis-positive for Pseudomonas bacteremia.  Patient s/p growth factor support.  Currently on cefepime.  At discharge patient is ciprofloxacin 750 mg twice daily for 2 weeks for Pseudomonas bacteremia  #Anemia thrombocytopenia neutropenia-s/p chemotherapy; overall improving.  #Small cell lung cancer-metastatic disease/stage IV-currently s/p chemoimmunotherapy.  Patient recommended to keep appointment with Dr. Bonnita Hollow for Dr. Janese Banks on 8/15-for cycle #2 of chemotherapy.     Cammie Sickle, MD 08/15/2021  9:59 PM

## 2021-08-15 NOTE — Discharge Summary (Signed)
Physician Discharge Summary  Maurice Simpson HYQ:[id number] DOB: 02/25/1955 DOA: 08/12/2021  PCP: [name], NP  Admit date: 08/12/2021  Discharge date: 08/15/2021  Admitted From: Home.  Disposition:  Home.  Recommendations for Outpatient Follow-up:  Follow up with PCP in 1-2 weeks. Please obtain BMP/CBC in one week Advised to follow-up with infectious disease specialist in 2 weeks. Advised to take ciprofloxacin 750 mg twice daily for 2 weeks for Pseudomonas bacteremia. Advised to follow-up with Dr. Randa Evens for pancytopenia.  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 Patient was recently diagnosed with small cell carcinoma in June 2023 and has just completed his first cycle of chemotherapy 1 week prior to his admission.  Labs pertinent include lactic acidosis, neutropenia, thrombocytopenia.  Imaging is suggestive of right middle lobe pneumonia.   Patient was admitted for post obstructive pneumonia,  started on IV antibiotics.  Oncology is consulted.  Blood cultures positive for Pseudomonas. Infectious diseases consulted.  Patient continued on IV cefepime.  Patient wants to be discharged. 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  Blood cultures grew Pseudomonas pansensitive.  Patient was cleared from oncology to be discharged and follow-up outpatient.  Infectious disease agreed to discharge patient on ciprofloxacin for  total 2 weeks duration for Pseudomonas bacteremia.  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  Patient is being discharged home patient will follow-up with Dr. Janese Banks in 1 week and Dr. Arlyss Gandy  infectious diseases in 2 weeks.   Discharge Diagnoses:  Principal Problem:   Febrile neutropenia ([place]) Active Problems:   Sepsis ([place])   Postobstructive pneumonia   Small cell lung cancer (HCC)   COPD (chronic obstructive pulmonary disease) (HCC)   Thrombocytopenia (HCC)   Palliative care encounter  Febrile neutropenia: Patient with history of metastatic small cell lung cancer, who has recently completed his first cycle of chemotherapy. He presented for the evaluation of fever. He is found to be neutropenic with a white cell count of 0.7 and ANC of 210. Patient has recently received Neulasta as an outpatient. WBC improved to 1.6. Continued empiric antibiotic therapy with vancomycin and cefepime. Blood cultures grew Pseudomonas, vancomycin discontinued. Infectious diseases consulted, continue cefepime for now. Echocardiogram completed, Normal .  Patient remains afebrile. Blood cultures grew Pseudomonas pansensitive.   Patient is being discharged on ciprofloxacin 750 mg bid for total 2 weeks duration.   Sepsis: > Resolved. Patient presented with fever with a Tmax of 101, tachycardia, tachypnea, lactic acidosis, neutropenia and imaging suggestive of right middle lobe pneumonia. Initiated empiric antibiotic therapy with ( vancomycin and cefepime ). Vancomycin discontinued, continue cefepime for now. Lactic acid has improved with IV hydration. Sepsis physiology resolved.  Patient is being discharged home on ciprofloxacin twice daily for 14 days   Postobstructive pneumonia: Likely secondary to malignancy. Continue IV antibiotics( Cefepime) Continue  Cipro twice daily for 2 weeks   Small cell lung cancer: Patient was diagnosed in June 2023 with metastatic small cell lung cancer  involving right lung,  He is s/p first cycle of chemotherapy as well as right brain radiation treatment for metastatic disease to the brain.   Oncology consult Dr. Janese Banks was notified.  Patient had recently received Neulasta as an outpatient, therefore does not require Neupogen.  Continue outpatient follow-up.   COPD: Continue as needed bronchodilator therapy and as well as inhaled steroid.   Not in an acute exacerbation.   Thrombocytopenia: Likely secondary to chemotherapy. There is no evidence of any active bleeding. Closely monitor during hospitalization.  Discharge Instructions  Discharge Instructions     Call MD for:  difficulty breathing, headache or visual disturbances   Complete by: As directed    Call MD for:  persistant dizziness or light-headedness   Complete by: As directed    Call MD for:  persistant nausea and vomiting   Complete by: As directed    Diet - low sodium heart healthy   Complete by: As directed    Diet Carb Modified   Complete by: As directed    Discharge instructions   Complete by: As directed    Advised to follow-up with primary care physician in 1 week. Advised to follow-up with infectious disease specialist 2 weeks. Advised to take ciprofloxacin 750 mg twice daily for 3 weeks for Pseudomonas bacteremia. Advised to follow-up with Dr. Randa Evens for pancytopenia.   Increase activity slowly   Complete by: As directed       Allergies as of 08/15/2021       Reactions   Bee Venom         Medication List     STOP taking these medications    Bevespi Aerosphere 9-4.8 MCG/ACT Aero Generic drug: Glycopyrrolate-Formoterol   EPINEPHrine 0.3 mg/0.3 mL Soaj injection Commonly known as: EPI-PEN   loratadine 10 MG tablet Commonly known as: CLARITIN       TAKE these medications    albuterol (2.5 MG/3ML) 0.083% nebulizer solution Commonly known as: PROVENTIL Take 3 mLs (2.5 mg total) by nebulization every 6 (six) hours as needed for wheezing  or shortness of breath.   budesonide 0.25 MG/2ML nebulizer solution Commonly known as: Pulmicort Take 2 mLs (0.25 mg total) by nebulization 2 (two) times daily as needed.   ciprofloxacin 750 MG tablet Commonly known as: CIPRO Take 1 tablet (750 mg total) by mouth 2 (two) times daily for 21 days.   dexamethasone 4 MG tablet Commonly known as: DECADRON Take 1 tablet by mouth twice a day for 7 days, then take 1 tablet by mouth daily for 7 days, then take 0.5 tablet by mouth daily for 7 days, then stop   lidocaine-prilocaine cream Commonly known as: EMLA Apply to affected area once   LORazepam 0.5 MG tablet Commonly known as: Ativan Take 1 tablet (0.5 mg total) by mouth every 6 (six) hours as needed (Nausea or vomiting).   magic mouthwash w/lidocaine Soln Take 5 mLs by mouth 4 (four) times daily.   ondansetron 4 MG tablet Commonly known as: Zofran Take 1 tablet (4 mg total) by mouth every 8 (eight) hours as needed for nausea or vomiting.   oxyCODONE 5 MG immediate release tablet Commonly known as: Oxy IR/ROXICODONE Take 1 tablet (5 mg total) by mouth every 4 (four) hours as needed for severe pain.   pantoprazole 20 MG tablet Commonly known as: PROTONIX TAKE 1 TABLET BY MOUTH EVERY DAY   prochlorperazine 10 MG tablet Commonly known as: COMPAZINE Take 1 tablet (10 mg total) by mouth  every 6 (six) hours as needed for nausea or vomiting.   traZODone 50 MG tablet Commonly known as: DESYREL TAKE 1 TABLET BY MOUTH AT BEDTIME AS NEEDED FOR SLEEP. What changed:  reasons to take this additional instructions   valACYclovir 1000 MG tablet Commonly known as: VALTREX Take 1 tablet (1,000 mg total) by mouth daily. Start taking on: August 16, 2021        Follow-up Information     [name], NP Follow up in 1 week(s).   Specialty: Nurse Practitioner Contact information: [place] [address] [phone]         Sindy Guadeloupe, MD Follow up in 1  week(s).   Specialty: Oncology Contact information: [place] [address] [phone]         Tsosie Billing, MD Follow up in 1 week(s).   Specialty: Infectious Diseases Contact information: [place] [address] [phone]                Allergies  Allergen Reactions   Bee Venom     Consultations: Infectious Diseases.   Procedures/Studies: ECHOCARDIOGRAM COMPLETE  Result Date: 08/14/2021    ECHOCARDIOGRAM REPORT   Patient Name:   NAVARRO NINE Date of Exam: 08/14/2021 Medical Rec #:  [id number]    Height:       68.0 in Accession #:    [id number]   Weight:       208.8 lb Date of Birth:  [birth year]    BSA:          2.081 m Patient Age:    38 years     BP:           110/63 mmHg Patient Gender: M            HR:           80 bpm. Exam Location:  [facility] Procedure: 2D Echo, Color Doppler and Cardiac Doppler Indications:     R78.81 Bacteremia  History:         Patient has prior history of Echocardiogram examinations, most                  recent 06/17/2021. COPD, Signs/Symptoms:Fever; Risk                  Factors:Current Smoker.  Sonographer:     Charmayne Sheer Referring Phys:  [id number] Tsosie Billing Diagnosing Phys: Nelva Bush MD  Sonographer Comments: Technically difficult study due to poor echo windows. Image acquisition challenging due to patient body habitus and Image acquisition challenging due to COPD. IMPRESSIONS  1. Left ventricular ejection fraction, by estimation, is 60 to 65%. The left ventricle has normal function. Left ventricular endocardial border not optimally defined to evaluate regional wall motion. Left ventricular diastolic parameters were normal.  2. Right ventricular systolic function is normal. The right ventricular size is normal.  3. The mitral valve is grossly normal. Trivial mitral valve regurgitation. No evidence of mitral stenosis.  4. The aortic valve was not well visualized. Aortic valve regurgitation is  not visualized. No aortic stenosis is present.  5. The inferior vena cava is normal in size with greater than 50% respiratory variability, suggesting right atrial pressure of 3 mmHg. FINDINGS  Left Ventricle: Left ventricular ejection fraction, by estimation, is 60 to 65%. The left ventricle has normal function. Left ventricular endocardial border not optimally defined to evaluate regional wall motion. The left ventricular internal cavity size  was normal in size. There is no left ventricular hypertrophy. Left ventricular diastolic parameters were normal. Right Ventricle: The right ventricular size is normal. No increase in right ventricular wall thickness. Right ventricular systolic function is normal. Left Atrium: Left atrial size was normal in size. Right Atrium: Right atrial size was normal in size. Pericardium: There is no evidence of pericardial effusion. Mitral Valve: The mitral valve is grossly normal. Trivial mitral valve regurgitation. No evidence of mitral valve stenosis. Tricuspid Valve: The tricuspid valve is normal in structure. Tricuspid valve regurgitation is not demonstrated. Aortic Valve: The aortic valve was not well visualized. Aortic valve regurgitation is not visualized. No aortic stenosis is present. Aortic valve mean gradient measures 4.0 mmHg. Aortic valve peak gradient measures 9.6 mmHg. Aortic valve area, by VTI measures 2.13 cm. Pulmonic Valve: The pulmonic valve was not well visualized. Pulmonic valve regurgitation is not visualized. No evidence of pulmonic stenosis. Aorta: The aortic root is normal in size and structure. Pulmonary Artery: The pulmonary artery is not well seen. Venous: The inferior vena cava is normal in size with greater than 50% respiratory variability, suggesting right atrial pressure of 3 mmHg. IAS/Shunts: The interatrial septum was not well visualized.  LEFT VENTRICLE PLAX 2D LVIDd:         4.57 cm   Diastology LVIDs:         2.72 cm   LV e' medial:    7.83 cm/s LV  PW:         0.94 cm   LV E/e' medial:  8.6 LV IVS:        0.96 cm   LV e' lateral:   9.90 cm/s LVOT diam:     1.90 cm   LV E/e' lateral: 6.8 LV SV:         58 LV SV Index:   28 LVOT Area:     2.84 cm  RIGHT VENTRICLE RV Basal diam:  3.20 cm LEFT ATRIUM             Index        RIGHT ATRIUM           Index LA diam:        2.70 cm 1.30 cm/m   RA Area:     10.30 cm LA Vol (A2C):   30.1 ml 14.46 ml/m  RA Volume:   18.40 ml  8.84 ml/m LA Vol (A4C):   32.5 ml 15.61 ml/m LA Biplane Vol: 31.8 ml 15.28 ml/m  AORTIC VALVE                    PULMONIC VALVE AV Area (Vmax):    1.94 cm     PV Vmax:       0.90 m/s AV Area (Vmean):   2.21 cm     PV Peak grad:  3.2 mmHg AV Area (VTI):     2.13 cm AV Vmax:           155.00 cm/s AV Vmean:          93.400 cm/s AV VTI:            0.274 m AV Peak Grad:      9.6 mmHg AV Mean Grad:      4.0 mmHg LVOT Vmax:         106.00 cm/s LVOT Vmean:        72.900 cm/s LVOT VTI:          0.206 m LVOT/AV VTI  ratio: 0.75  AORTA Ao Root diam: 3.50 cm MITRAL VALVE MV Area (PHT): 2.84 cm    SHUNTS MV Decel Time: 267 msec    Systemic VTI:  0.21 m MV E velocity: 67.30 cm/s  Systemic Diam: 1.90 cm MV A velocity: 73.70 cm/s MV E/A ratio:  0.91 Harrell Gave End MD Electronically signed by Nelva Bush MD Signature Date/Time: 08/14/2021/1:51:17 PM    Final    DG Chest 2 View  Result Date: 08/12/2021 CLINICAL DATA:  Sepsis, syncope, short of breath EXAM: CHEST - 2 VIEW COMPARISON:  04/30/2021 FINDINGS: 3 Port in the anterior chest wall with tip in distal SVC. Normal mediastinum and cardiac silhouette. Normal pulmonary vasculature Density over the heart on lateral projection is favored RIGHT middle lobe opacity. No pleural fluid IMPRESSION: Concern for RIGHT middle lobe pneumonia or atelectasis Electronically Signed   By: Suzy Bouchard M.D.   On: 08/12/2021 13:17   CT Angio Chest Pulmonary Embolism (PE) W or WO Contrast  Result Date: 08/07/2021 CLINICAL DATA:  Chest pain. EXAM: CT ANGIOGRAPHY  CHEST WITH CONTRAST TECHNIQUE: Multidetector CT imaging of the chest was performed using the standard protocol during bolus administration of intravenous contrast. Multiplanar CT image reconstructions and MIPs were obtained to evaluate the vascular anatomy. RADIATION DOSE REDUCTION: This exam was performed according to the departmental dose-optimization program which includes automated exposure control, adjustment of the mA and/or kV according to patient size and/or use of iterative reconstruction technique. CONTRAST:  40mL OMNIPAQUE IOHEXOL 350 MG/ML SOLN COMPARISON:  May 29, 2021. FINDINGS: Cardiovascular: Satisfactory opacification of the pulmonary arteries to the segmental level. No evidence of pulmonary embolism. Normal heart size. No pericardial effusion. Mediastinum/Nodes: No enlarged mediastinal, hilar, or axillary lymph nodes. Thyroid gland, trachea, and esophagus demonstrate no significant findings. Lungs/Pleura: No pneumothorax or pleural effusion is noted. 5.5 x 3.8 cm right infrahilar mass is again noted consistent with malignancy. Mild postobstructive atelectasis is noted. Upper Abdomen: Bilateral adrenal masses are noted concerning for metastatic disease. Musculoskeletal: No chest wall abnormality. No acute or significant osseous findings. Review of the MIP images confirms the above findings. IMPRESSION: No definite evidence of pulmonary embolus. 5.5 x 3.8 cm right infrahilar mass is noted consistent with malignancy with associated postobstructive atelectasis. Bilateral adrenal masses are noted concerning for metastatic disease. Electronically Signed   By: Marijo Conception M.D.   On: 08/07/2021 16:17   IR IMAGING GUIDED PORT INSERTION  Result Date: 07/27/2021 CLINICAL DATA:  Metastatic small cell lung carcinoma and need for porta cath for chemotherapy. EXAM: IMPLANTED PORT A CATH PLACEMENT WITH ULTRASOUND AND FLUOROSCOPIC GUIDANCE ANESTHESIA/SEDATION: Moderate (conscious) sedation was employed  during this procedure. A total of Versed 2.0 mg and Fentanyl 100 mcg was administered intravenously by radiology nursing. Moderate Sedation Time: 20 minutes. The patient's level of consciousness and vital signs were monitored continuously by radiology nursing throughout the procedure under my direct supervision. FLUOROSCOPY: 36 seconds.  11.4 mGy. PROCEDURE: The procedure, risks, benefits, and alternatives were explained to the patient. Questions regarding the procedure were encouraged and answered. The patient understands and consents to the procedure. A time-out was performed prior to initiating the procedure. Ultrasound was utilized to confirm patency of the right internal jugular vein. A permanent ultrasound image was recorded. The right neck and chest were prepped with chlorhexidine in a sterile fashion, and a sterile drape was applied covering the operative field. Maximum barrier sterile technique with sterile gowns and gloves were used for the procedure. Local anesthesia was provided  with 1% lidocaine. After creating a small venotomy incision, a 21 gauge needle was advanced into the right internal jugular vein under direct, real-time ultrasound guidance. Ultrasound image documentation was performed. After securing guidewire access, an 8 Fr dilator was placed. A J-wire was kinked to measure appropriate catheter length. A subcutaneous port pocket was then created along the upper chest wall utilizing sharp and blunt dissection. Portable cautery was utilized. The pocket was irrigated with sterile saline. A single lumen power injectable port was chosen for placement. The 8 Fr catheter was tunneled from the port pocket site to the venotomy incision. The port was placed in the pocket. External catheter was trimmed to appropriate length based on guidewire measurement. At the venotomy, an 8 Fr peel-away sheath was placed over a guidewire. The catheter was then placed through the sheath and the sheath removed. Final  catheter positioning was confirmed and documented with a fluoroscopic spot image. The port was accessed with a needle and aspirated and flushed with heparinized saline. The access needle was removed. The venotomy and port pocket incisions were closed with subcutaneous 3-0 Monocryl and subcuticular 4-0 Vicryl. Dermabond was applied to both incisions. COMPLICATIONS: COMPLICATIONS None FINDINGS: After catheter placement, the tip lies at the cavo-atrial junction. The catheter aspirates normally and is ready for immediate use. IMPRESSION: Placement of single lumen port a cath via right internal jugular vein. The catheter tip lies at the cavo-atrial junction. A power injectable port a cath was placed and is ready for immediate use. Electronically Signed   By: Aletta Edouard M.D.   On: 07/27/2021 12:58      Subjective: Patient was seen and examined at bedside.  Overnight events noted.   Patient reports feeling better and wants to be discharged.  Patient being discharged home on oral antibiotics.  Discharge Exam: Vitals:   08/15/21 0746 08/15/21 1115  BP: 117/80 134/71  Pulse: 75 82  Resp: 18 18  Temp: 98.1 F (36.7 C) 98.3 F (36.8 C)  SpO2: 91% 92%   Vitals:   08/14/21 2359 08/15/21 0317 08/15/21 0746 08/15/21 1115  BP: 115/64 120/72 117/80 134/71  Pulse: 76 75 75 82  Resp: 17 18 18 18   Temp: 98.3 F (36.8 C) 98.5 F (36.9 C) 98.1 F (36.7 C) 98.3 F (36.8 C)  TempSrc:   Oral   SpO2: 95% 92% 91% 92%  Weight:      Height:        General: Pt is alert, awake, not in acute distress Cardiovascular: RRR, S1/S2 +, no rubs, no gallops Respiratory: CTA bilaterally, no wheezing, no rhonchi Abdominal: Soft, NT, ND, bowel sounds + Extremities: no edema, no cyanosis    The results of significant diagnostics from this hospitalization (including imaging, microbiology, ancillary and laboratory) are listed below for reference.     Microbiology: Recent Results (from the past 240 hour(s))   Culture, blood (Routine x 2)     Status: Abnormal   Collection Time: 08/12/21 12:58 PM   Specimen: BLOOD  Result Value Ref Range Status   Specimen Description   Final    BLOOD PORT Performed at [facility], [address] [place], [place] [address]    Special Requests   Final    BOTTLES DRAWN AEROBIC AND ANAEROBIC Blood Culture results may not be optimal due to an excessive volume of blood received in culture bottles Performed at [facility], [address], [place], [place] [address]    Culture  Setup Time  Final    GRAM NEGATIVE RODS ANAEROBIC BOTTLE ONLY CRITICAL RESULT CALLED TO, READ BACK BY AND VERIFIED WITH: Lu Duffel PHARMD 1300 08/13/21 HNM Performed at [facility] [address] [place], [place] [address]    Culture PSEUDOMONAS AERUGINOSA (A)  Final   Report Status 08/15/2021 FINAL  Final   Organism ID, Bacteria PSEUDOMONAS AERUGINOSA  Final      Susceptibility   Pseudomonas aeruginosa - MIC*    CEFTAZIDIME 4 SENSITIVE Sensitive     CIPROFLOXACIN <=0.25 SENSITIVE Sensitive     GENTAMICIN 2 SENSITIVE Sensitive     IMIPENEM 1 SENSITIVE Sensitive     PIP/TAZO 8 SENSITIVE Sensitive     CEFEPIME 2 SENSITIVE Sensitive     * PSEUDOMONAS AERUGINOSA  Blood Culture ID Panel (Reflexed)     Status: Abnormal   Collection Time: 08/12/21 12:58 PM  Result Value Ref Range Status   Enterococcus faecalis NOT DETECTED NOT DETECTED Final   Enterococcus Faecium NOT DETECTED NOT DETECTED Final   Listeria monocytogenes NOT DETECTED NOT DETECTED Final   Staphylococcus species NOT DETECTED NOT DETECTED Final   Staphylococcus aureus (BCID) NOT DETECTED NOT DETECTED Final   Staphylococcus epidermidis NOT DETECTED NOT DETECTED Final   Staphylococcus lugdunensis NOT DETECTED NOT DETECTED Final   Streptococcus species NOT DETECTED NOT DETECTED Final   Streptococcus agalactiae NOT DETECTED NOT DETECTED Final   Streptococcus pneumoniae NOT DETECTED NOT  DETECTED Final   Streptococcus pyogenes NOT DETECTED NOT DETECTED Final   A.calcoaceticus-baumannii NOT DETECTED NOT DETECTED Final   Bacteroides fragilis NOT DETECTED NOT DETECTED Final   Enterobacterales NOT DETECTED NOT DETECTED Final   Enterobacter cloacae complex NOT DETECTED NOT DETECTED Final   Escherichia coli NOT DETECTED NOT DETECTED Final   Klebsiella aerogenes NOT DETECTED NOT DETECTED Final   Klebsiella oxytoca NOT DETECTED NOT DETECTED Final   Klebsiella pneumoniae NOT DETECTED NOT DETECTED Final   Proteus species NOT DETECTED NOT DETECTED Final   Salmonella species NOT DETECTED NOT DETECTED Final   Serratia marcescens NOT DETECTED NOT DETECTED Final   Haemophilus influenzae NOT DETECTED NOT DETECTED Final   Neisseria meningitidis NOT DETECTED NOT DETECTED Final   Pseudomonas aeruginosa DETECTED (A) NOT DETECTED Final    Comment: CRITICAL RESULT CALLED TO, READ BACK BY AND VERIFIED WITH: DEVAN MITCHELL PHARMD 1300 08/13/21 HNM    Stenotrophomonas maltophilia NOT DETECTED NOT DETECTED Final   Candida albicans NOT DETECTED NOT DETECTED Final   Candida auris NOT DETECTED NOT DETECTED Final   Candida glabrata NOT DETECTED NOT DETECTED Final   Candida krusei NOT DETECTED NOT DETECTED Final   Candida parapsilosis NOT DETECTED NOT DETECTED Final   Candida tropicalis NOT DETECTED NOT DETECTED Final   Cryptococcus neoformans/gattii NOT DETECTED NOT DETECTED Final   CTX-M ESBL NOT DETECTED NOT DETECTED Final   Carbapenem resistance IMP NOT DETECTED NOT DETECTED Final   Carbapenem resistance KPC NOT DETECTED NOT DETECTED Final   Carbapenem resistance NDM NOT DETECTED NOT DETECTED Final   Carbapenem resistance VIM NOT DETECTED NOT DETECTED Final    Comment: Performed at [facility], [address] [place], [place] [address]  Culture, blood (Routine x 2)     Status: None (Preliminary result)   Collection Time: 08/12/21  1:00 PM   Specimen: BLOOD  Result Value Ref Range  Status   Specimen Description BLOOD LEFT AC  Final   Special Requests   Final    BOTTLES DRAWN AEROBIC AND ANAEROBIC Blood Culture results  may not be optimal due to an excessive volume of blood received in culture bottles   Culture   Final    NO GROWTH 3 DAYS Performed at [facility], [address] [place], [place] [address]    Report Status PENDING  Incomplete  Culture, blood (Routine X 2) w Reflex to ID Panel     Status: None (Preliminary result)   Collection Time: 08/14/21  5:58 AM   Specimen: BLOOD  Result Value Ref Range Status   Specimen Description BLOOD RIGHT ASSIST CONTROL  Final   Special Requests   Final    BOTTLES DRAWN AEROBIC AND ANAEROBIC Blood Culture adequate volume   Culture   Final    NO GROWTH < 24 HOURS Performed at [facility], [address] [place], [place] [address]    Report Status PENDING  Incomplete  Culture, blood (Routine X 2) w Reflex to ID Panel     Status: None (Preliminary result)   Collection Time: 08/14/21  5:58 AM   Specimen: BLOOD  Result Value Ref Range Status   Specimen Description BLOOD RIGHT HAND  Final   Special Requests   Final    BOTTLES DRAWN AEROBIC AND ANAEROBIC Blood Culture adequate volume   Culture   Final    NO GROWTH < 24 HOURS Performed at [facility], [address] [place], [place] [address]    Report Status PENDING  Incomplete     Labs: BNP (last 3 results) No results for input(s): "BNP" in the last 8760 hours. Basic Metabolic Panel: Recent Labs  Lab 08/12/21 1041 08/13/21 0503 08/14/21 0520  NA 133* 133* 136  K 4.1 4.0 3.8  CL 97* 104 103  CO2 26 22 27   GLUCOSE 172* 127* 82  BUN 19 13 11   CREATININE 0.84 0.81 0.65  CALCIUM 9.1 8.5* 8.8*  MG  --   --  2.0  PHOS  --   --  3.2   Liver Function Tests: Recent Labs  Lab 08/12/21 1041  AST 35  ALT 81*  ALKPHOS 77  BILITOT 2.0*  PROT 7.4  ALBUMIN 3.8   No results for input(s): "LIPASE", "AMYLASE" in the last 168  hours. No results for input(s): "AMMONIA" in the last 168 hours. CBC: Recent Labs  Lab 08/12/21 1041 08/13/21 0503 08/14/21 0520  WBC 0.7* 0.7* 1.6*  NEUTROABS 0.0*  --   --   HGB 15.7 11.8* 12.7*  HCT 45.0 33.6* 35.8*  MCV 91.6 88.9 89.7  PLT 46* 38* 39*   Cardiac Enzymes: No results for input(s): "CKTOTAL", "CKMB", "CKMBINDEX", "TROPONINI" in the last 168 hours. BNP: Invalid input(s): "POCBNP" CBG: No results for input(s): "GLUCAP" in the last 168 hours. D-Dimer No results for input(s): "DDIMER" in the last 72 hours. Hgb A1c No results for input(s): "HGBA1C" in the last 72 hours. Lipid Profile No results for input(s): "CHOL", "HDL", "LDLCALC", "TRIG", "CHOLHDL", "LDLDIRECT" in the last 72 hours. Thyroid function studies No results for input(s): "TSH", "T4TOTAL", "T3FREE", "THYROIDAB" in the last 72 hours.  Invalid input(s): "FREET3" Anemia work up No results for input(s): "VITAMINB12", "FOLATE", "FERRITIN", "TIBC", "IRON", "RETICCTPCT" in the last 72 hours. Urinalysis    Component Value Date/Time   COLORURINE YELLOW (A) 08/14/2021 0525   APPEARANCEUR CLEAR (A) 08/14/2021 0525   APPEARANCEUR Hazy 01/21/2011 0127   LABSPEC 1.012 08/14/2021 0525   LABSPEC 1.014 01/21/2011 0127   PHURINE 6.0 08/14/2021 0525   GLUCOSEU NEGATIVE 08/14/2021 0525   GLUCOSEU Negative  01/21/2011 0127   HGBUR NEGATIVE 08/14/2021 0525   BILIRUBINUR NEGATIVE 08/14/2021 0525   BILIRUBINUR Negative 01/21/2011 0127   KETONESUR NEGATIVE 08/14/2021 0525   PROTEINUR NEGATIVE 08/14/2021 0525   NITRITE NEGATIVE 08/14/2021 0525   LEUKOCYTESUR NEGATIVE 08/14/2021 0525   LEUKOCYTESUR Negative 01/21/2011 0127   Sepsis Labs Recent Labs  Lab 08/12/21 1041 08/13/21 0503 08/14/21 0520  WBC 0.7* 0.7* 1.6*   Microbiology Recent Results (from the past 240 hour(s))  Culture, blood (Routine x 2)     Status: Abnormal   Collection Time: 08/12/21 12:58 PM   Specimen: BLOOD  Result Value Ref Range  Status   Specimen Description   Final    BLOOD PORT Performed at [facility], [address], [place], [place] [address]    Special Requests   Final    BOTTLES DRAWN AEROBIC AND ANAEROBIC Blood Culture results may not be optimal due to an excessive volume of blood received in culture bottles Performed at [facility], [address] [place], [place] [address]    Culture  Setup Time   Final    GRAM NEGATIVE RODS ANAEROBIC BOTTLE ONLY CRITICAL RESULT CALLED TO, READ BACK BY AND VERIFIED WITH: Lu Duffel PHARMD 1300 08/13/21 HNM Performed at [facility], [place] [address] [place], [place] [address]    Culture PSEUDOMONAS AERUGINOSA (A)  Final   Report Status 08/15/2021 FINAL  Final   Organism ID, Bacteria PSEUDOMONAS AERUGINOSA  Final      Susceptibility   Pseudomonas aeruginosa - MIC*    CEFTAZIDIME 4 SENSITIVE Sensitive     CIPROFLOXACIN <=0.25 SENSITIVE Sensitive     GENTAMICIN 2 SENSITIVE Sensitive     IMIPENEM 1 SENSITIVE Sensitive     PIP/TAZO 8 SENSITIVE Sensitive     CEFEPIME 2 SENSITIVE Sensitive     * PSEUDOMONAS AERUGINOSA  Blood Culture ID Panel (Reflexed)     Status: Abnormal   Collection Time: 08/12/21 12:58 PM  Result Value Ref Range Status   Enterococcus faecalis NOT DETECTED NOT DETECTED Final   Enterococcus Faecium NOT DETECTED NOT DETECTED Final   Listeria monocytogenes NOT DETECTED NOT DETECTED Final   Staphylococcus species NOT DETECTED NOT DETECTED Final   Staphylococcus aureus (BCID) NOT DETECTED NOT DETECTED Final   Staphylococcus epidermidis NOT DETECTED NOT DETECTED Final   Staphylococcus lugdunensis NOT DETECTED NOT DETECTED Final   Streptococcus species NOT DETECTED NOT DETECTED Final   Streptococcus agalactiae NOT DETECTED NOT DETECTED Final   Streptococcus pneumoniae NOT DETECTED NOT DETECTED Final   Streptococcus pyogenes NOT DETECTED NOT DETECTED Final   A.calcoaceticus-baumannii NOT DETECTED NOT DETECTED Final    Bacteroides fragilis NOT DETECTED NOT DETECTED Final   Enterobacterales NOT DETECTED NOT DETECTED Final   Enterobacter cloacae complex NOT DETECTED NOT DETECTED Final   Escherichia coli NOT DETECTED NOT DETECTED Final   Klebsiella aerogenes NOT DETECTED NOT DETECTED Final   Klebsiella oxytoca NOT DETECTED NOT DETECTED Final   Klebsiella pneumoniae NOT DETECTED NOT DETECTED Final   Proteus species NOT DETECTED NOT DETECTED Final   Salmonella species NOT DETECTED NOT DETECTED Final   Serratia marcescens NOT DETECTED NOT DETECTED Final   Haemophilus influenzae NOT DETECTED NOT DETECTED Final   Neisseria meningitidis NOT DETECTED NOT DETECTED Final   Pseudomonas aeruginosa DETECTED (A) NOT DETECTED Final    Comment: CRITICAL RESULT CALLED TO, READ BACK BY AND VERIFIED WITH: DEVAN MITCHELL PHARMD 1300 08/13/21 HNM    Stenotrophomonas maltophilia NOT DETECTED NOT DETECTED Final  Candida albicans NOT DETECTED NOT DETECTED Final   Candida auris NOT DETECTED NOT DETECTED Final   Candida glabrata NOT DETECTED NOT DETECTED Final   Candida krusei NOT DETECTED NOT DETECTED Final   Candida parapsilosis NOT DETECTED NOT DETECTED Final   Candida tropicalis NOT DETECTED NOT DETECTED Final   Cryptococcus neoformans/gattii NOT DETECTED NOT DETECTED Final   CTX-M ESBL NOT DETECTED NOT DETECTED Final   Carbapenem resistance IMP NOT DETECTED NOT DETECTED Final   Carbapenem resistance KPC NOT DETECTED NOT DETECTED Final   Carbapenem resistance NDM NOT DETECTED NOT DETECTED Final   Carbapenem resistance VIM NOT DETECTED NOT DETECTED Final    Comment: Performed at [facility], [address] [place], [place] [address]  Culture, blood (Routine x 2)     Status: None (Preliminary result)   Collection Time: 08/12/21  1:00 PM   Specimen: BLOOD  Result Value Ref Range Status   Specimen Description BLOOD LEFT AC  Final   Special Requests   Final    BOTTLES DRAWN AEROBIC AND ANAEROBIC Blood Culture  results may not be optimal due to an excessive volume of blood received in culture bottles   Culture   Final    NO GROWTH 3 DAYS Performed at [facility], [address], [place], [place] [address]    Report Status PENDING  Incomplete  Culture, blood (Routine X 2) w Reflex to ID Panel     Status: None (Preliminary result)   Collection Time: 08/14/21  5:58 AM   Specimen: BLOOD  Result Value Ref Range Status   Specimen Description BLOOD RIGHT ASSIST CONTROL  Final   Special Requests   Final    BOTTLES DRAWN AEROBIC AND ANAEROBIC Blood Culture adequate volume   Culture   Final    NO GROWTH < 24 HOURS Performed at [facility], [address] [place], [place] [address]    Report Status PENDING  Incomplete  Culture, blood (Routine X 2) w Reflex to ID Panel     Status: None (Preliminary result)   Collection Time: 08/14/21  5:58 AM   Specimen: BLOOD  Result Value Ref Range Status   Specimen Description BLOOD RIGHT HAND  Final   Special Requests   Final    BOTTLES DRAWN AEROBIC AND ANAEROBIC Blood Culture adequate volume   Culture   Final    NO GROWTH < 24 HOURS Performed at [facility], [address], [place], [place] [address]    Report Status PENDING  Incomplete     Time coordinating discharge: Over 30 minutes  SIGNED:   Shawna Clamp, MD  [facility] 08/15/2021, 2:27 PM Pager   If 7PM-7AM, please contact night-coverage

## 2021-08-17 ENCOUNTER — Other Ambulatory Visit: Payer: Self-pay | Admitting: *Deleted

## 2021-08-17 ENCOUNTER — Telehealth: Payer: Self-pay | Admitting: *Deleted

## 2021-08-17 LAB — CULTURE, BLOOD (ROUTINE X 2): Culture: NO GROWTH

## 2021-08-17 NOTE — Telephone Encounter (Signed)
Transition Care Management Unsuccessful Follow-up Telephone Call  Date of discharge and from where:  [place] 08-13-2021  Attempts:  1st Attempt  Reason for unsuccessful TCM follow-up call:  Left voice message  Patient needs to schedule a hospital follow up for week of 8-12 cannady

## 2021-08-17 NOTE — Patient Outreach (Signed)
  Care Coordination [facility] Note Transition Care Management Follow-up Telephone Call Date of discharge and from where: [facility] [phone] How have you been since you were released from the hospital? Feeling ok Any questions or concerns? Yes  Items Reviewed: Did the pt receive and understand the discharge instructions provided? Yes  Medications obtained and verified? Yes  Other? Yes  When patient went to get atbx 2 prescriptions for levofloxacin 750 mg and 500 mg. RN went over instructions to take the 750 mg per Dr order and d/cd instruction.  Any new allergies since your discharge? No  Dietary orders reviewed? Yes Do you have support at home? Yes   Home Care and Equipment/Supplies: Were home health services ordered? no If so, what is the name of the agency? N   Has the agency set up a time to come to the patient's home? not applicable Were any new equipment or medical supplies ordered?  No What is the name of the medical supply agency? N  Were you able to get the supplies/equipment? N/a N/a  Do you have any questions related to the use of the equipment or supplies? No  Functional Questionnaire: (I = Independent and D = Dependent) ADLs: I  Bathing/Dressing- I  Meal Prep- I  Eating- I  Maintaining continence- I  Transferring/Ambulation- I  Managing Meds- I  Follow up appointments reviewed:  PCP Hospital f/u appt confirmed? No   Specialist Hospital f/u appt confirmed? Yes  Scheduled to see Altha Harm [phone] 11:30 and Dr Delaine Lame [phone] 11:45. Are transportation arrangements needed? No  If their condition worsens, is the pt aware to call PCP or go to the Emergency Dept.? Yes Was the patient provided with contact information for the PCP's office or ED? Yes Was to pt encouraged to call back with questions or concerns? Yes  SDOH assessments and interventions completed:   Yes  Care Coordination Interventions Activated:  No   Care Coordination Interventions:   N      Encounter Outcome:  Pt. Visit Completed

## 2021-08-19 ENCOUNTER — Inpatient Hospital Stay (HOSPITAL_BASED_OUTPATIENT_CLINIC_OR_DEPARTMENT_OTHER): Payer: Medicare HMO | Admitting: Hospice and Palliative Medicine

## 2021-08-19 DIAGNOSIS — C3490 Malignant neoplasm of unspecified part of unspecified bronchus or lung: Secondary | ICD-10-CM

## 2021-08-19 LAB — CULTURE, BLOOD (ROUTINE X 2)
Culture: NO GROWTH
Culture: NO GROWTH
Special Requests: ADEQUATE
Special Requests: ADEQUATE

## 2021-08-19 NOTE — Progress Notes (Signed)
No answer to call.  Voicemail left.  Will reschedule.

## 2021-08-20 ENCOUNTER — Encounter: Payer: Self-pay | Admitting: Infectious Diseases

## 2021-08-20 ENCOUNTER — Telehealth: Payer: Self-pay | Admitting: *Deleted

## 2021-08-20 ENCOUNTER — Ambulatory Visit: Payer: Medicare HMO | Attending: Infectious Diseases | Admitting: Infectious Diseases

## 2021-08-20 VITALS — BP 110/72 | HR 91 | Temp 98.1°F

## 2021-08-20 DIAGNOSIS — J449 Chronic obstructive pulmonary disease, unspecified: Secondary | ICD-10-CM | POA: Diagnosis not present

## 2021-08-20 DIAGNOSIS — Z862 Personal history of diseases of the blood and blood-forming organs and certain disorders involving the immune mechanism: Secondary | ICD-10-CM | POA: Insufficient documentation

## 2021-08-20 DIAGNOSIS — Z8619 Personal history of other infectious and parasitic diseases: Secondary | ICD-10-CM | POA: Diagnosis not present

## 2021-08-20 DIAGNOSIS — D709 Neutropenia, unspecified: Secondary | ICD-10-CM | POA: Insufficient documentation

## 2021-08-20 DIAGNOSIS — C3491 Malignant neoplasm of unspecified part of right bronchus or lung: Secondary | ICD-10-CM

## 2021-08-20 DIAGNOSIS — Z792 Long term (current) use of antibiotics: Secondary | ICD-10-CM | POA: Diagnosis not present

## 2021-08-20 DIAGNOSIS — R7881 Bacteremia: Secondary | ICD-10-CM | POA: Diagnosis present

## 2021-08-20 DIAGNOSIS — C7931 Secondary malignant neoplasm of brain: Secondary | ICD-10-CM | POA: Diagnosis not present

## 2021-08-20 DIAGNOSIS — H9201 Otalgia, right ear: Secondary | ICD-10-CM | POA: Insufficient documentation

## 2021-08-20 DIAGNOSIS — C3490 Malignant neoplasm of unspecified part of unspecified bronchus or lung: Secondary | ICD-10-CM | POA: Diagnosis not present

## 2021-08-20 DIAGNOSIS — B965 Pseudomonas (aeruginosa) (mallei) (pseudomallei) as the cause of diseases classified elsewhere: Secondary | ICD-10-CM

## 2021-08-20 DIAGNOSIS — F17210 Nicotine dependence, cigarettes, uncomplicated: Secondary | ICD-10-CM | POA: Diagnosis not present

## 2021-08-20 DIAGNOSIS — D701 Agranulocytosis secondary to cancer chemotherapy: Secondary | ICD-10-CM

## 2021-08-20 NOTE — Telephone Encounter (Signed)
Transition Care Management Unsuccessful Follow-up Telephone Call  Date of discharge and from where:  [facility]   Attempts:  2nd Attempt  Reason for unsuccessful TCM follow-up call:  Left voice message

## 2021-08-20 NOTE — Patient Instructions (Addendum)
You are here for follow up of pseudomonas bacteremia and on ciprofloxacin until 08/27/21. You need to do labs- cbc cmp You are complaining of rt ear pain- There is some white stuff inside- I am referring you to ENT Dr. Daisy Blossom [phone]

## 2021-08-20 NOTE — Telephone Encounter (Signed)
Wife called reporting patient is having sharpe shooting pains in his right ear for past 3 days. He denies fever, congestion, or headache. There is no drainage from ear. Patient sees have an appointment with Dr Delaine Lame at 1145 today so I asked that she mention this to him and told her I would send message to our practioners as well.

## 2021-08-20 NOTE — Progress Notes (Signed)
NAME: Maurice Simpson  DOB: 02-04-1955  MRN: [id number]  Date/Time: 08/20/2021 12:13 PM   Subjective:   ?here with his wife Was recently hospitalized  between 8/2-08/15/21 for pseudomonas bacteremia with febrile neutropenia following chemo Discharged home on ciprofloxacin 750 mg twice daily for 2 weeks until 08/27/2021. Maurice Simpson is a 66 y.o. 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  He received his first chemo on 08/03/2021 following which she came to the hospital with shortness of breath and was hypoxic.   He had a WBC of 0.7.  Platelet was 46.  CT chest had shown a right hilar mass with postobstructive pneumonia possibility which was present since June 2023.  Blood culture was positive for Pseudomonas. 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  He was also put on Valtrex.  He is here for follow-up and he is doing better His main complaint is right heel pain which has been present since his hospitalization.  There is no discharge.  Feels that it is blocked.  No sore throat but thinks that there could be discharge from the ear coming down into his throat. No fever or chills  Past Medical History:  Diagnosis Date   Cancer ([place])    COPD (chronic obstructive pulmonary disease) ([place])    Dyspnea     Past Surgical History:  Procedure Laterality Date   CATARACT EXTRACTION Right 03/10/2021   IR IMAGING GUIDED PORT INSERTION  07/27/2021    Social History   Socioeconomic History   Marital status: Married    Spouse name: Not on file   Number of children: Not on file   Years of education: Not on file   Highest education level: Not on file  Occupational History   Not on file  Tobacco Use   Smoking status: Every Day    Packs/day: 2.00    Years: 51.00    Total pack years: 102.00    Types: Cigarettes   Smokeless tobacco: Never  Vaping Use   Vaping Use:  Never used  Substance and Sexual Activity   Alcohol use: Not Currently   Drug use: Never   Sexual activity: Yes  Other Topics Concern   Not on file  Social History Narrative   Not on file   Social Determinants of Health   Financial Resource Strain: Low Risk  (07/13/2021)   Overall Financial Resource Strain (CARDIA)    Difficulty of Paying Living Expenses: Not hard at all  Food Insecurity: No Food Insecurity (07/13/2021)   Hunger Vital Sign    Worried About Running Out of Food in the Last Year: Never true    Ran Out of Food in the Last Year: Never true  Transportation Needs: No Transportation Needs (07/13/2021)   PRAPARE - Hydrologist (Medical): No    Lack of Transportation (Non-Medical): No  Physical Activity: Inactive (07/20/2019)   Exercise Vital Sign    Days of Exercise per Week: 0 days    Minutes of Exercise per Session: 0 min  Stress: Stress Concern Present (07/13/2021)   [place]    Feeling of Stress : To some extent  Social Connections: Moderately Isolated (07/13/2021)   Social Connection and Isolation Panel [NHANES]    Frequency of Communication with Friends and Family: More than  three times a week    Frequency of Social Gatherings with Friends and Family: Twice a week    Attends Religious Services: Never    Marine scientist or Organizations: No    Attends Archivist Meetings: Never    Marital Status: Married  Human resources officer Violence: Not At Risk (07/13/2021)   Humiliation, Afraid, Rape, and Kick questionnaire    Fear of Current or Ex-Partner: No    Emotionally Abused: No    Physically Abused: No    Sexually Abused: No    Family History  Problem Relation Age of Onset   Diabetes Mother    Hypertension Mother    Diabetes Father    Hypertension Father    Diabetes Sister    Cancer - Colon Cousin    Allergies  Allergen Reactions   Bee Venom    I? Current  Outpatient Medications  Medication Sig Dispense Refill   albuterol (PROVENTIL) (2.5 MG/3ML) 0.083% nebulizer solution Take 3 mLs (2.5 mg total) by nebulization every 6 (six) hours as needed for wheezing or shortness of breath. 75 mL 12   budesonide (PULMICORT) 0.25 MG/2ML nebulizer solution Take 2 mLs (0.25 mg total) by nebulization 2 (two) times daily as needed. 60 mL 12   ciprofloxacin (CIPRO) 750 MG tablet Take 1 tablet (750 mg total) by mouth 2 (two) times daily for 21 days. 42 tablet 0   dexamethasone (DECADRON) 4 MG tablet Take 1 tablet by mouth twice a day for 7 days, then take 1 tablet by mouth daily for 7 days, then take 0.5 tablet by mouth daily for 7 days, then stop 30 tablet 0   lidocaine-prilocaine (EMLA) cream Apply to affected area once 30 g 3   LORazepam (ATIVAN) 0.5 MG tablet Take 1 tablet (0.5 mg total) by mouth every 6 (six) hours as needed (Nausea or vomiting). 30 tablet 0   magic mouthwash w/lidocaine SOLN Take 5 mLs by mouth 4 (four) times daily. 480 mL 0   ondansetron (ZOFRAN) 4 MG tablet Take 1 tablet (4 mg total) by mouth every 8 (eight) hours as needed for nausea or vomiting. 60 tablet 3   oxyCODONE (OXY IR/ROXICODONE) 5 MG immediate release tablet Take 1 tablet (5 mg total) by mouth every 4 (four) hours as needed for severe pain. 60 tablet 0   pantoprazole (PROTONIX) 20 MG tablet TAKE 1 TABLET BY MOUTH EVERY DAY 90 tablet 0   prochlorperazine (COMPAZINE) 10 MG tablet Take 1 tablet (10 mg total) by mouth every 6 (six) hours as needed for nausea or vomiting. 60 tablet 2   traZODone (DESYREL) 50 MG tablet TAKE 1 TABLET BY MOUTH AT BEDTIME AS NEEDED FOR SLEEP. 90 tablet 1   valACYclovir (VALTREX) 1000 MG tablet Take 1 tablet (1,000 mg total) by mouth daily. 10 tablet 10   No current facility-administered medications for this visit.     Abtx:  Anti-infectives (From admission, onward)    None       REVIEW OF SYSTEMS:  Const: negative fever, negative chills, negative  weight loss Eyes: negative diplopia or visual changes, negative eye pain ENT: As above Resp: negative cough, hemoptysis, some dyspnea Cards: negative for chest pain, palpitations, lower extremity edema GU: negative for frequency, dysuria and hematuria GI: Negative for abdominal pain, diarrhea, bleeding, constipation Skin: negative for rash and pruritus Heme: negative for easy bruising and gum/nose bleeding MS: General weakness Came to the clinic in a wheelchair Neurolo:negative for headaches, dizziness, vertigo, memory problems  Psych: negative for feelings of anxiety, depression  Endocrine: negative for thyroid, diabetes Allergy/Immunology- negative for any medication or food allergies Been on allergy Objective:  VITALS:  BP 110/72   Pulse 91   Temp 98.1 F (36.7 C) (Temporal)   PHYSICAL EXAM:  General: Alert, cooperative, no distress, appears stated age.  Head: Normocephalic, without obvious abnormality, atraumatic. Eyes: Conjunctivae clear, anicteric sclerae. Pupils are equal ENT.  On examination of his right ear there is a whitish fluffy stuff deep in th ear . No discharge, no pain on pulling the pinna.  no nares normal. No drainage or sinus tenderness. Lips, mucosa, and tongue normal. No Thrush Neck: Supple, symmetrical, no adenopathy, thyroid: non tender no carotid bruit and no JVD. Lungs: Bilateral air entry.  Decreased in the right side. Heart: Regular rate and rhythm, no murmur, rub or gallop. Abdomen: Did not examine as he is in the wheelchair extremities: atraumatic, no cyanosis. No edema. No clubbing Skin: No rashes or lesions. Or bruising Lymph: Cervical, supraclavicular normal. Neurologic: Grossly non-focal Port in place Pertinent Labs Lab Results CBC    Component Value Date/Time   WBC 1.6 (L) 08/14/2021 0520   RBC 3.99 (L) 08/14/2021 0520   HGB 12.7 (L) 08/14/2021 0520   HGB 18.0 (H) 04/30/2021 1136   HCT 35.8 (L) 08/14/2021 0520   HCT 53.3 (H)  04/30/2021 1136   PLT 39 (L) 08/14/2021 0520   PLT 156 04/30/2021 1136   MCV 89.7 08/14/2021 0520   MCV 91 04/30/2021 1136   MCV 94 01/21/2011 0134   MCH 31.8 08/14/2021 0520   MCHC 35.5 08/14/2021 0520   RDW 12.3 08/14/2021 0520   RDW 13.0 04/30/2021 1136   RDW 13.1 01/21/2011 0134   LYMPHSABS 0.6 (L) 08/12/2021 1041   LYMPHSABS 2.9 04/30/2021 1136   MONOABS 0.0 (L) 08/12/2021 1041   EOSABS 0.0 08/12/2021 1041   EOSABS 0.3 04/30/2021 1136   BASOSABS 0.0 08/12/2021 1041   BASOSABS 0.1 04/30/2021 1136       Latest Ref Rng & Units 08/14/2021    5:20 AM 08/13/2021    5:03 AM 08/12/2021   10:41 AM  CMP  Glucose 70 - 99 mg/dL 82  127  172   BUN 8 - 23 mg/dL 11  13  19    Creatinine 0.61 - 1.24 mg/dL 0.65  0.81  0.84   Sodium 135 - 145 mmol/L 136  133  133   Potassium 3.5 - 5.1 mmol/L 3.8  4.0  4.1   Chloride 98 - 111 mmol/L 103  104  97   CO2 22 - 32 mmol/L 27  22  26    Calcium 8.9 - 10.3 mg/dL 8.8  8.5  9.1   Total Protein 6.5 - 8.1 g/dL   7.4   Total Bilirubin 0.3 - 1.2 mg/dL   2.0   Alkaline Phos 38 - 126 U/L   77   AST 15 - 41 U/L   35   ALT 0 - 44 U/L   81       Microbiology: Recent Results (from the past 240 hour(s))  Culture, blood (Routine x 2)     Status: Abnormal   Collection Time: 08/12/21 12:58 PM   Specimen: BLOOD  Result Value Ref Range Status   Specimen Description   Final    BLOOD PORT Performed at [facility], [address], [place], [place] [address]    Special Requests   Final    BOTTLES DRAWN AEROBIC  AND ANAEROBIC Blood Culture results may not be optimal due to an excessive volume of blood received in culture bottles Performed at [facility], [address] [place], [place] [address]    Culture  Setup Time   Final    GRAM NEGATIVE RODS ANAEROBIC BOTTLE ONLY CRITICAL RESULT CALLED TO, READ BACK BY AND VERIFIED WITH: Lu Duffel PHARMD 1300 08/13/21 HNM Performed at [facility], [place] [address] [place], [place]  [address]    Culture PSEUDOMONAS AERUGINOSA (A)  Final   Report Status 08/15/2021 FINAL  Final   Organism ID, Bacteria PSEUDOMONAS AERUGINOSA  Final      Susceptibility   Pseudomonas aeruginosa - MIC*    CEFTAZIDIME 4 SENSITIVE Sensitive     CIPROFLOXACIN <=0.25 SENSITIVE Sensitive     GENTAMICIN 2 SENSITIVE Sensitive     IMIPENEM 1 SENSITIVE Sensitive     PIP/TAZO 8 SENSITIVE Sensitive     CEFEPIME 2 SENSITIVE Sensitive     * PSEUDOMONAS AERUGINOSA  Blood Culture ID Panel (Reflexed)     Status: Abnormal   Collection Time: 08/12/21 12:58 PM  Result Value Ref Range Status   Enterococcus faecalis NOT DETECTED NOT DETECTED Final   Enterococcus Faecium NOT DETECTED NOT DETECTED Final   Listeria monocytogenes NOT DETECTED NOT DETECTED Final   Staphylococcus species NOT DETECTED NOT DETECTED Final   Staphylococcus aureus (BCID) NOT DETECTED NOT DETECTED Final   Staphylococcus epidermidis NOT DETECTED NOT DETECTED Final   Staphylococcus lugdunensis NOT DETECTED NOT DETECTED Final   Streptococcus species NOT DETECTED NOT DETECTED Final   Streptococcus agalactiae NOT DETECTED NOT DETECTED Final   Streptococcus pneumoniae NOT DETECTED NOT DETECTED Final   Streptococcus pyogenes NOT DETECTED NOT DETECTED Final   A.calcoaceticus-baumannii NOT DETECTED NOT DETECTED Final   Bacteroides fragilis NOT DETECTED NOT DETECTED Final   Enterobacterales NOT DETECTED NOT DETECTED Final   Enterobacter cloacae complex NOT DETECTED NOT DETECTED Final   Escherichia coli NOT DETECTED NOT DETECTED Final   Klebsiella aerogenes NOT DETECTED NOT DETECTED Final   Klebsiella oxytoca NOT DETECTED NOT DETECTED Final   Klebsiella pneumoniae NOT DETECTED NOT DETECTED Final   Proteus species NOT DETECTED NOT DETECTED Final   Salmonella species NOT DETECTED NOT DETECTED Final   Serratia marcescens NOT DETECTED NOT DETECTED Final   Haemophilus influenzae NOT DETECTED NOT DETECTED Final   Neisseria meningitidis NOT  DETECTED NOT DETECTED Final   Pseudomonas aeruginosa DETECTED (A) NOT DETECTED Final    Comment: CRITICAL RESULT CALLED TO, READ BACK BY AND VERIFIED WITH: DEVAN MITCHELL PHARMD 1300 08/13/21 HNM    Stenotrophomonas maltophilia NOT DETECTED NOT DETECTED Final   Candida albicans NOT DETECTED NOT DETECTED Final   Candida auris NOT DETECTED NOT DETECTED Final   Candida glabrata NOT DETECTED NOT DETECTED Final   Candida krusei NOT DETECTED NOT DETECTED Final   Candida parapsilosis NOT DETECTED NOT DETECTED Final   Candida tropicalis NOT DETECTED NOT DETECTED Final   Cryptococcus neoformans/gattii NOT DETECTED NOT DETECTED Final   CTX-M ESBL NOT DETECTED NOT DETECTED Final   Carbapenem resistance IMP NOT DETECTED NOT DETECTED Final   Carbapenem resistance KPC NOT DETECTED NOT DETECTED Final   Carbapenem resistance NDM NOT DETECTED NOT DETECTED Final   Carbapenem resistance VIM NOT DETECTED NOT DETECTED Final    Comment: Performed at [facility], [address] [place], [place] [address]  Culture, blood (Routine x 2)     Status: None   Collection Time: 08/12/21  1:00 PM   Specimen: BLOOD  Result Value Ref Range Status   Specimen Description BLOOD LEFT AC  Final   Special Requests   Final    BOTTLES DRAWN AEROBIC AND ANAEROBIC Blood Culture results may not be optimal due to an excessive volume of blood received in culture bottles   Culture   Final    NO GROWTH 5 DAYS Performed at [facility], [address] [place], [place] [address]    Report Status 08/17/2021 FINAL  Final  Culture, blood (Routine X 2) w Reflex to ID Panel     Status: None   Collection Time: 08/14/21  5:58 AM   Specimen: BLOOD  Result Value Ref Range Status   Specimen Description BLOOD RIGHT ASSIST CONTROL  Final   Special Requests   Final    BOTTLES DRAWN AEROBIC AND ANAEROBIC Blood Culture adequate volume   Culture   Final    NO GROWTH 5 DAYS Performed at [facility], [address] [place], [place] [address]    Report Status 08/19/2021 FINAL  Final  Culture, blood (Routine X 2) w Reflex to ID Panel     Status: None   Collection Time: 08/14/21  5:58 AM   Specimen: BLOOD  Result Value Ref Range Status   Specimen Description BLOOD RIGHT HAND  Final   Special Requests   Final    BOTTLES DRAWN AEROBIC AND ANAEROBIC Blood Culture adequate volume   Culture   Final    NO GROWTH 5 DAYS Performed at [facility], [address], [place], [place] [address]    Report Status 08/19/2021 FINAL  Final    Impression/Recommendation Pseudomonas bacteremia has resolved. He is currently on ciprofloxacin and will be completing 2 weeks on 08/27/2021    Neutropenia.  His last labs from July 4and wbc  is 1.6.  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  But he does have labs with oncologist on Monday.  Will need CBC and CMP  Otalgia on the right side.  On examination there seems to be a white stuff in the ear which could be fungus or debris.  Communicated with Dr. Kathyrn Sheriff ENT  and patient will be seen tomorrow at 3:15 PM at [place] patient about appointment  Small cell carcinoma of the lung stage IV with mets Follow-up with oncologist ? Follow-up as needed with me ___________________________________________________ Discussed with patient, and his wife in great detail Note:  This document was prepared using Dragon voice recognition software and may include unintentional dictation errors.

## 2021-08-20 NOTE — Telephone Encounter (Signed)
Call returned to [facility] to inform her that if Dr Delaine Lame does not handle his ear pain, to call us back and we will see him. [place] that she has found out more info on this from patient and that he told her he actually started have drainage down his throat while in the hospital and was to ld by nurse that he is on antibiotics and it should handle any infection he may have. She will call us back if Dr Delaine Lame does not treat this

## 2021-08-21 DIAGNOSIS — H60391 Other infective otitis externa, right ear: Secondary | ICD-10-CM | POA: Diagnosis not present

## 2021-08-21 MED FILL — Dexamethasone Sodium Phosphate Inj 100 MG/10ML: INTRAMUSCULAR | Qty: 1 | Status: AC

## 2021-08-21 MED FILL — Fosaprepitant Dimeglumine For IV Infusion 150 MG (Base Eq): INTRAVENOUS | Qty: 5 | Status: AC

## 2021-08-24 ENCOUNTER — Inpatient Hospital Stay: Payer: Medicare HMO

## 2021-08-24 ENCOUNTER — Inpatient Hospital Stay: Payer: Medicare HMO | Admitting: Hospice and Palliative Medicine

## 2021-08-24 ENCOUNTER — Inpatient Hospital Stay: Payer: Medicare HMO | Admitting: Oncology

## 2021-08-24 MED FILL — Dexamethasone Sodium Phosphate Inj 100 MG/10ML: INTRAMUSCULAR | Qty: 1 | Status: AC

## 2021-08-24 MED FILL — Fosaprepitant Dimeglumine For IV Infusion 150 MG (Base Eq): INTRAVENOUS | Qty: 5 | Status: AC

## 2021-08-25 ENCOUNTER — Inpatient Hospital Stay: Payer: Medicare HMO

## 2021-08-25 ENCOUNTER — Other Ambulatory Visit: Payer: Self-pay

## 2021-08-25 ENCOUNTER — Institutional Professional Consult (permissible substitution): Payer: Medicare HMO | Admitting: Pulmonary Disease

## 2021-08-25 ENCOUNTER — Inpatient Hospital Stay (HOSPITAL_BASED_OUTPATIENT_CLINIC_OR_DEPARTMENT_OTHER): Payer: Medicare HMO | Admitting: Oncology

## 2021-08-25 ENCOUNTER — Encounter: Payer: Self-pay | Admitting: Oncology

## 2021-08-25 ENCOUNTER — Encounter: Payer: Self-pay | Admitting: *Deleted

## 2021-08-25 ENCOUNTER — Inpatient Hospital Stay (HOSPITAL_BASED_OUTPATIENT_CLINIC_OR_DEPARTMENT_OTHER): Payer: Medicare HMO | Admitting: Hospice and Palliative Medicine

## 2021-08-25 VITALS — BP 118/79 | HR 89 | Temp 98.3°F | Ht 68.0 in | Wt 200.0 lb

## 2021-08-25 DIAGNOSIS — Z5111 Encounter for antineoplastic chemotherapy: Secondary | ICD-10-CM | POA: Diagnosis not present

## 2021-08-25 DIAGNOSIS — Z515 Encounter for palliative care: Secondary | ICD-10-CM | POA: Diagnosis not present

## 2021-08-25 DIAGNOSIS — C786 Secondary malignant neoplasm of retroperitoneum and peritoneum: Secondary | ICD-10-CM | POA: Diagnosis not present

## 2021-08-25 DIAGNOSIS — T451X5A Adverse effect of antineoplastic and immunosuppressive drugs, initial encounter: Secondary | ICD-10-CM | POA: Diagnosis not present

## 2021-08-25 DIAGNOSIS — C7970 Secondary malignant neoplasm of unspecified adrenal gland: Secondary | ICD-10-CM | POA: Diagnosis not present

## 2021-08-25 DIAGNOSIS — C3490 Malignant neoplasm of unspecified part of unspecified bronchus or lung: Secondary | ICD-10-CM | POA: Diagnosis not present

## 2021-08-25 DIAGNOSIS — B370 Candidal stomatitis: Secondary | ICD-10-CM

## 2021-08-25 DIAGNOSIS — E876 Hypokalemia: Secondary | ICD-10-CM | POA: Diagnosis not present

## 2021-08-25 DIAGNOSIS — R110 Nausea: Secondary | ICD-10-CM | POA: Diagnosis not present

## 2021-08-25 DIAGNOSIS — C7931 Secondary malignant neoplasm of brain: Secondary | ICD-10-CM | POA: Diagnosis not present

## 2021-08-25 DIAGNOSIS — R634 Abnormal weight loss: Secondary | ICD-10-CM

## 2021-08-25 DIAGNOSIS — D701 Agranulocytosis secondary to cancer chemotherapy: Secondary | ICD-10-CM | POA: Diagnosis not present

## 2021-08-25 DIAGNOSIS — B965 Pseudomonas (aeruginosa) (mallei) (pseudomallei) as the cause of diseases classified elsewhere: Secondary | ICD-10-CM | POA: Diagnosis not present

## 2021-08-25 DIAGNOSIS — R7881 Bacteremia: Secondary | ICD-10-CM

## 2021-08-25 DIAGNOSIS — Z95828 Presence of other vascular implants and grafts: Secondary | ICD-10-CM

## 2021-08-25 DIAGNOSIS — C7889 Secondary malignant neoplasm of other digestive organs: Secondary | ICD-10-CM | POA: Diagnosis not present

## 2021-08-25 DIAGNOSIS — G893 Neoplasm related pain (acute) (chronic): Secondary | ICD-10-CM

## 2021-08-25 DIAGNOSIS — Z5112 Encounter for antineoplastic immunotherapy: Secondary | ICD-10-CM | POA: Diagnosis not present

## 2021-08-25 LAB — COMPREHENSIVE METABOLIC PANEL
ALT: 24 U/L (ref 0–44)
AST: 23 U/L (ref 15–41)
Albumin: 3.2 g/dL — ABNORMAL LOW (ref 3.5–5.0)
Alkaline Phosphatase: 94 U/L (ref 38–126)
Anion gap: 9 (ref 5–15)
BUN: 11 mg/dL (ref 8–23)
CO2: 24 mmol/L (ref 22–32)
Calcium: 9 mg/dL (ref 8.9–10.3)
Chloride: 104 mmol/L (ref 98–111)
Creatinine, Ser: 0.96 mg/dL (ref 0.61–1.24)
GFR, Estimated: >60 mL/min (ref >60)
Glucose, Bld: 118 mg/dL — ABNORMAL HIGH (ref 70–99)
Potassium: 3.4 mmol/L — ABNORMAL LOW (ref 3.5–5.1)
Sodium: 137 mmol/L (ref 135–145)
Total Bilirubin: 0.6 mg/dL (ref 0.3–1.2)
Total Protein: 8 g/dL (ref 6.5–8.1)

## 2021-08-25 LAB — CBC WITH DIFFERENTIAL/PLATELET
Abs Immature Granulocytes: 0.9 10*3/uL — ABNORMAL HIGH (ref 0.00–0.07)
Basophils Absolute: 0.3 10*3/uL — ABNORMAL HIGH (ref 0.0–0.1)
Basophils Relative: 2 %
Eosinophils Absolute: 0 10*3/uL (ref 0.0–0.5)
Eosinophils Relative: 0 %
HCT: 40.4 % (ref 39.0–52.0)
Hemoglobin: 14 g/dL (ref 13.0–17.0)
Immature Granulocytes: 6 %
Lymphocytes Relative: 15 %
Lymphs Abs: 2.4 10*3/uL (ref 0.7–4.0)
MCH: 31.3 pg (ref 26.0–34.0)
MCHC: 34.7 g/dL (ref 30.0–36.0)
MCV: 90.2 fL (ref 80.0–100.0)
Monocytes Absolute: 1.3 10*3/uL — ABNORMAL HIGH (ref 0.1–1.0)
Monocytes Relative: 8 %
Neutro Abs: 10.6 10*3/uL — ABNORMAL HIGH (ref 1.7–7.7)
Neutrophils Relative %: 69 %
Platelets: 386 10*3/uL (ref 150–400)
RBC: 4.48 MIL/uL (ref 4.22–5.81)
RDW: 13.3 % (ref 11.5–15.5)
WBC: 15.4 10*3/uL — ABNORMAL HIGH (ref 4.0–10.5)
nRBC: 0.2 % (ref 0.0–0.2)

## 2021-08-25 LAB — TSH: TSH: 1.898 u[IU]/mL (ref 0.350–4.500)

## 2021-08-25 MED ORDER — LIDOCAINE VISCOUS HCL 2 % MT SOLN
5.0000 mL | Freq: Three times a day (TID) | OROMUCOSAL | 0 refills | Status: DC
Start: 2021-08-25 — End: 2021-08-25

## 2021-08-25 MED ORDER — NYSTATIN 100000 UNIT/ML MT SUSP: 5.0000 mL | Freq: Four times a day (QID) | OROMUCOSAL | 0 refills | Status: DC

## 2021-08-25 MED ORDER — NYSTATIN 100000 UNIT/ML MT SUSP: 5.0000 mL | Freq: Three times a day (TID) | OROMUCOSAL | 0 refills | Status: DC

## 2021-08-25 MED ORDER — SODIUM CHLORIDE 0.9% FLUSH
10.0000 mL | Freq: Once | INTRAVENOUS | Status: AC
  Administered 2021-08-25: 10 mL via INTRAVENOUS
  Filled 2021-08-25: qty 10

## 2021-08-25 MED ORDER — HEPARIN SOD (PORK) LOCK FLUSH 100 UNIT/ML IV SOLN
500.0000 [IU] | Freq: Once | INTRAVENOUS | Status: AC
  Administered 2021-08-25: 500 [IU] via INTRAVENOUS
  Filled 2021-08-25: qty 5

## 2021-08-25 MED ORDER — DRONABINOL 5 MG PO CAPS: 5.0000 mg | ORAL_CAPSULE | Freq: Two times a day (BID) | ORAL | 0 refills | Status: DC

## 2021-08-25 NOTE — Progress Notes (Signed)
Met with patient and his wife during follow up visit with Dr. Tasia Catchings. All questions answered during visit. Pt did not have any further questions or needs at this time. Will follow up next week.

## 2021-08-25 NOTE — Addendum Note (Signed)
Addended by: Telford Nab on: 08/25/2021 03:51 PM   Modules accepted: Orders

## 2021-08-25 NOTE — Progress Notes (Signed)
[place] at [facility] Telephone:([phone]) [phone] [phone]   Name: Maurice Simpson Date: 08/25/2021 MRN: [id number]  DOB: 1955/04/08  Patient Care Team: [name], NP as PCP - General (Nurse Practitioner) Telford Nab, RN as Oncology Nurse Navigator    REASON FOR CONSULTATION: Maurice Simpson is a 66 y.o. 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  Patient was hospitalized 08/12/2021 to 08/15/2021 with neutropenic fever and found to have postobstructive pneumonia and Pseudomonas bacteremia. .   SOCIAL HISTORY:     reports that he has been smoking cigarettes. He has a 102.00 pack-year smoking history. He has never used smokeless tobacco. He reports that he does not currently use alcohol. He reports that he does not use drugs.  Patient is married and lives at home with his wife.  He has 2 daughters and a son, all of whom live in [place].  Patient owns and operates a tree service.  ADVANCE DIRECTIVES:  Not on file  CODE STATUS: Full code  PAST MEDICAL HISTORY: Past Medical History:  Diagnosis Date   Cancer ([place])    COPD (chronic obstructive pulmonary disease) ([place])    Dyspnea     PAST SURGICAL HISTORY:  Past Surgical History:  Procedure Laterality Date   CATARACT EXTRACTION Right 03/10/2021   IR IMAGING GUIDED PORT INSERTION  07/27/2021    HEMATOLOGY/ONCOLOGY HISTORY:  Oncology History  Small cell lung cancer ([place])  07/21/2021 Initial Diagnosis   Small cell lung cancer ([place])   07/21/2021 Cancer Staging   Staging form: Lung, AJCC 8th Edition - Clinical stage from 07/21/2021: Stage IVB (cT2, cN0, pM1c) - Signed by Sindy Guadeloupe, MD on 07/21/2021   08/03/2021 -  Chemotherapy   Patient is on Treatment Plan : LUNG SCLC Carboplatin + Etoposide + Atezolizumab  Induction q21d / Atezolizumab Maintenance q21d       ALLERGIES:  is allergic to bee venom.  MEDICATIONS:  Current Outpatient Medications  Medication Sig Dispense Refill   albuterol (PROVENTIL) (2.5 MG/3ML) 0.083% nebulizer solution Take 3 mLs (2.5 mg total) by nebulization every 6 (six) hours as needed for wheezing or shortness of breath. 75 mL 12   budesonide (PULMICORT) 0.25 MG/2ML nebulizer solution Take 2 mLs (0.25 mg total) by nebulization 2 (two) times daily as needed. 60 mL 12   ciprofloxacin (CIPRO) 750 MG tablet Take 1 tablet (750 mg total) by mouth 2 (two) times daily for 21 days. 42 tablet 0   clotrimazole (LOTRIMIN) 1 % external solution SMARTSIG:3-4 Drop(s) In Ear(s) Twice Daily     dexamethasone (DECADRON) 4 MG tablet Take 1 tablet by mouth twice a day for 7 days, then take 1 tablet by mouth daily for 7 days, then take 0.5 tablet by mouth daily for 7 days, then stop 30 tablet 0   dronabinol (MARINOL) 5 MG capsule Take 1 capsule (5 mg total) by mouth 2 (two) times daily before lunch and supper. 30 capsule 0   lidocaine-prilocaine (EMLA) cream Apply to affected area once 30 g 3   LORazepam (ATIVAN) 0.5 MG tablet Take 1 tablet (0.5 mg total) by mouth every 6 (six) hours as needed (Nausea or vomiting). 30 tablet 0   magic mouthwash (nystatin, lidocaine, diphenhydrAMINE, alum & mag hydroxide) suspension Swish and swallow 5 mLs 3 (three) times daily. 180 mL 0  magic mouthwash w/lidocaine SOLN Take 5 mLs by mouth 4 (four) times daily. 480 mL 0   NEOMYCIN-POLYMYXIN-HYDROCORTISONE (CORTISPORIN) 1 % SOLN OTIC solution SMARTSIG:3-4 Drop(s) In Ear(s) Twice Daily     ondansetron (ZOFRAN) 4 MG tablet Take 1 tablet (4 mg total) by mouth every 8 (eight) hours as needed for nausea or vomiting. 60 tablet 3   oxyCODONE (OXY IR/ROXICODONE) 5 MG immediate release tablet Take 1 tablet (5 mg total) by mouth every 4 (four) hours as needed for severe pain. 60 tablet 0   pantoprazole (PROTONIX) 20 MG  tablet TAKE 1 TABLET BY MOUTH EVERY DAY 90 tablet 0   prochlorperazine (COMPAZINE) 10 MG tablet Take 1 tablet (10 mg total) by mouth every 6 (six) hours as needed for nausea or vomiting. 60 tablet 2   traZODone (DESYREL) 50 MG tablet TAKE 1 TABLET BY MOUTH AT BEDTIME AS NEEDED FOR SLEEP. 90 tablet 1   valACYclovir (VALTREX) 1000 MG tablet Take 1 tablet (1,000 mg total) by mouth daily. 10 tablet 10   No current facility-administered medications for this visit.    VITAL SIGNS: There were no vitals taken for this visit. There were no vitals filed for this visit.  Estimated body mass index is 30.41 kg/m as calculated from the following:   Height as of an earlier encounter on 08/25/21: 5\' 8"  (1.727 m).   Weight as of an earlier encounter on 08/25/21: 200 lb (90.7 kg).  LABS: CBC:    Component Value Date/Time   WBC 15.4 (H) 08/25/2021 0833   HGB 14.0 08/25/2021 0833   HGB 18.0 (H) 04/30/2021 1136   HCT 40.4 08/25/2021 0833   HCT 53.3 (H) 04/30/2021 1136   PLT 386 08/25/2021 0833   PLT 156 04/30/2021 1136   MCV 90.2 08/25/2021 0833   MCV 91 04/30/2021 1136   MCV 94 01/21/2011 0134   NEUTROABS 10.6 (H) 08/25/2021 0833   NEUTROABS 4.8 04/30/2021 1136   LYMPHSABS 2.4 08/25/2021 0833   LYMPHSABS 2.9 04/30/2021 1136   MONOABS 1.3 (H) 08/25/2021 0833   EOSABS 0.0 08/25/2021 0833   EOSABS 0.3 04/30/2021 1136   BASOSABS 0.3 (H) 08/25/2021 0833   BASOSABS 0.1 04/30/2021 1136   Comprehensive Metabolic Panel:    Component Value Date/Time   NA 137 08/25/2021 0833   NA 136 04/30/2021 1136   NA 139 01/21/2011 0134   K 3.4 (L) 08/25/2021 0833   K 3.8 01/21/2011 0134   CL 104 08/25/2021 0833   CL 105 01/21/2011 0134   CO2 24 08/25/2021 0833   CO2 23 01/21/2011 0134   BUN 11 08/25/2021 0833   BUN 12 04/30/2021 1136   BUN 15 01/21/2011 0134   CREATININE 0.96 08/25/2021 0833   CREATININE 0.92 01/21/2011 0134   GLUCOSE 118 (H) 08/25/2021 0833   GLUCOSE 118 (H) 01/21/2011 0134   CALCIUM  9.0 08/25/2021 0833   CALCIUM 9.1 01/21/2011 0134   AST 23 08/25/2021 0833   AST 20 01/21/2011 0134   ALT 24 08/25/2021 0833   ALT 29 01/21/2011 0134   ALKPHOS 94 08/25/2021 0833   ALKPHOS 46 (L) 01/21/2011 0134   BILITOT 0.6 08/25/2021 0833   BILITOT 0.8 04/30/2021 1136   BILITOT 0.8 01/21/2011 0134   PROT 8.0 08/25/2021 0833   PROT 7.2 04/30/2021 1136   PROT 7.4 01/21/2011 0134   ALBUMIN 3.2 (L) 08/25/2021 0833   ALBUMIN 4.3 04/30/2021 1136   ALBUMIN 3.8 01/21/2011 0134    RADIOGRAPHIC STUDIES: ECHOCARDIOGRAM COMPLETE  Result Date: 08/14/2021    ECHOCARDIOGRAM REPORT   Patient Name:   TOSH GLAZE Date of Exam: 08/14/2021 Medical Rec #:  [id number]    Height:       68.0 in Accession #:    [id number]   Weight:       208.8 lb Date of Birth:  Sep 22, 1955    BSA:          2.081 m Patient Age:    67 years     BP:           110/63 mmHg Patient Gender: M            HR:           80 bpm. Exam Location:  [facility] Procedure: 2D Echo, Color Doppler and Cardiac Doppler Indications:     R78.81 Bacteremia  History:         Patient has prior history of Echocardiogram examinations, most                  recent 06/17/2021. COPD, Signs/Symptoms:Fever; Risk                  Factors:Current Smoker.  Sonographer:     Charmayne Sheer Referring Phys:  [id number] Tsosie Billing Diagnosing Phys: Nelva Bush MD  Sonographer Comments: Technically difficult study due to poor echo windows. Image acquisition challenging due to patient body habitus and Image acquisition challenging due to COPD. IMPRESSIONS  1. Left ventricular ejection fraction, by estimation, is 60 to 65%. The left ventricle has normal function. Left ventricular endocardial border not optimally defined to evaluate regional wall motion. Left ventricular diastolic parameters were normal.  2. Right ventricular systolic function is normal. The right ventricular size is normal.  3. The mitral valve is grossly normal. Trivial mitral valve regurgitation. No evidence of  mitral stenosis.  4. The aortic valve was not well visualized. Aortic valve regurgitation is not visualized. No aortic stenosis is present.  5. The inferior vena cava is normal in size with greater than 50% respiratory variability, suggesting right atrial pressure of 3 mmHg. FINDINGS  Left Ventricle: Left ventricular ejection fraction, by estimation, is 60 to 65%. The left ventricle has normal function. Left ventricular endocardial border not optimally defined to evaluate regional wall motion. The left ventricular internal cavity size was normal in size. There is no left ventricular hypertrophy. Left ventricular diastolic parameters were normal. Right Ventricle: The right ventricular size is normal. No increase in right ventricular wall thickness. Right ventricular systolic function is normal. Left Atrium: Left atrial size was normal in size. Right Atrium: Right atrial size was normal in size. Pericardium: There is no evidence of pericardial effusion. Mitral Valve: The mitral valve is grossly normal. Trivial mitral valve regurgitation. No evidence of mitral valve stenosis. Tricuspid Valve: The tricuspid valve is normal in structure. Tricuspid valve regurgitation is not demonstrated. Aortic Valve: The aortic valve was not well visualized. Aortic valve regurgitation is not visualized. No aortic stenosis is present. Aortic valve mean gradient measures 4.0 mmHg. Aortic valve peak gradient measures 9.6 mmHg. Aortic valve area, by VTI measures 2.13 cm. Pulmonic Valve: The pulmonic valve was not well visualized. Pulmonic valve regurgitation is not visualized. No evidence of pulmonic stenosis. Aorta: The aortic root is normal in size and structure. Pulmonary Artery: The pulmonary artery is not well seen. Venous: The inferior vena cava is normal in size with greater than 50% respiratory variability, suggesting right atrial pressure of 3 mmHg. IAS/Shunts: The  interatrial septum was not well visualized.  LEFT VENTRICLE PLAX  2D LVIDd:         4.57 cm   Diastology LVIDs:         2.72 cm   LV e' medial:    7.83 cm/s LV PW:         0.94 cm   LV E/e' medial:  8.6 LV IVS:        0.96 cm   LV e' lateral:   9.90 cm/s LVOT diam:     1.90 cm   LV E/e' lateral: 6.8 LV SV:         58 LV SV Index:   28 LVOT Area:     2.84 cm  RIGHT VENTRICLE RV Basal diam:  3.20 cm LEFT ATRIUM             Index        RIGHT ATRIUM           Index LA diam:        2.70 cm 1.30 cm/m   RA Area:     10.30 cm LA Vol (A2C):   30.1 ml 14.46 ml/m  RA Volume:   18.40 ml  8.84 ml/m LA Vol (A4C):   32.5 ml 15.61 ml/m LA Biplane Vol: 31.8 ml 15.28 ml/m  AORTIC VALVE                    PULMONIC VALVE AV Area (Vmax):    1.94 cm     PV Vmax:       0.90 m/s AV Area (Vmean):   2.21 cm     PV Peak grad:  3.2 mmHg AV Area (VTI):     2.13 cm AV Vmax:           155.00 cm/s AV Vmean:          93.400 cm/s AV VTI:            0.274 m AV Peak Grad:      9.6 mmHg AV Mean Grad:      4.0 mmHg LVOT Vmax:         106.00 cm/s LVOT Vmean:        72.900 cm/s LVOT VTI:          0.206 m LVOT/AV VTI ratio: 0.75  AORTA Ao Root diam: 3.50 cm MITRAL VALVE MV Area (PHT): 2.84 cm    SHUNTS MV Decel Time: 267 msec    Systemic VTI:  0.21 m MV E velocity: 67.30 cm/s  Systemic Diam: 1.90 cm MV A velocity: 73.70 cm/s MV E/A ratio:  0.91 Harrell Gave End MD Electronically signed by Nelva Bush MD Signature Date/Time: 08/14/2021/1:51:17 PM    Final    DG Chest 2 View  Result Date: 08/12/2021 CLINICAL DATA:  Sepsis, syncope, short of breath EXAM: CHEST - 2 VIEW COMPARISON:  04/30/2021 FINDINGS: 3 Port in the anterior chest wall with tip in distal SVC. Normal mediastinum and cardiac silhouette. Normal pulmonary vasculature Density over the heart on lateral projection is favored RIGHT middle lobe opacity. No pleural fluid IMPRESSION: Concern for RIGHT middle lobe pneumonia or atelectasis Electronically Signed   By: Suzy Bouchard M.D.   On: 08/12/2021 13:17   CT Angio Chest Pulmonary Embolism (PE)  W or WO Contrast  Result Date: 08/07/2021 CLINICAL DATA:  Chest pain. EXAM: CT ANGIOGRAPHY CHEST WITH CONTRAST TECHNIQUE: Multidetector CT imaging of the chest was performed using the standard protocol during bolus administration  of intravenous contrast. Multiplanar CT image reconstructions and MIPs were obtained to evaluate the vascular anatomy. RADIATION DOSE REDUCTION: This exam was performed according to the departmental dose-optimization program which includes automated exposure control, adjustment of the mA and/or kV according to patient size and/or use of iterative reconstruction technique. CONTRAST:  19mL OMNIPAQUE IOHEXOL 350 MG/ML SOLN COMPARISON:  May 29, 2021. FINDINGS: Cardiovascular: Satisfactory opacification of the pulmonary arteries to the segmental level. No evidence of pulmonary embolism. Normal heart size. No pericardial effusion. Mediastinum/Nodes: No enlarged mediastinal, hilar, or axillary lymph nodes. Thyroid gland, trachea, and esophagus demonstrate no significant findings. Lungs/Pleura: No pneumothorax or pleural effusion is noted. 5.5 x 3.8 cm right infrahilar mass is again noted consistent with malignancy. Mild postobstructive atelectasis is noted. Upper Abdomen: Bilateral adrenal masses are noted concerning for metastatic disease. Musculoskeletal: No chest wall abnormality. No acute or significant osseous findings. Review of the MIP images confirms the above findings. IMPRESSION: No definite evidence of pulmonary embolus. 5.5 x 3.8 cm right infrahilar mass is noted consistent with malignancy with associated postobstructive atelectasis. Bilateral adrenal masses are noted concerning for metastatic disease. Electronically Signed   By: Marijo Conception M.D.   On: 08/07/2021 16:17   IR IMAGING GUIDED PORT INSERTION  Result Date: 07/27/2021 CLINICAL DATA:  Metastatic small cell lung carcinoma and need for porta cath for chemotherapy. EXAM: IMPLANTED PORT A CATH PLACEMENT WITH ULTRASOUND  AND FLUOROSCOPIC GUIDANCE ANESTHESIA/SEDATION: Moderate (conscious) sedation was employed during this procedure. A total of Versed 2.0 mg and Fentanyl 100 mcg was administered intravenously by radiology nursing. Moderate Sedation Time: 20 minutes. The patient's level of consciousness and vital signs were monitored continuously by radiology nursing throughout the procedure under my direct supervision. FLUOROSCOPY: 36 seconds.  11.4 mGy. PROCEDURE: The procedure, risks, benefits, and alternatives were explained to the patient. Questions regarding the procedure were encouraged and answered. The patient understands and consents to the procedure. A time-out was performed prior to initiating the procedure. Ultrasound was utilized to confirm patency of the right internal jugular vein. A permanent ultrasound image was recorded. The right neck and chest were prepped with chlorhexidine in a sterile fashion, and a sterile drape was applied covering the operative field. Maximum barrier sterile technique with sterile gowns and gloves were used for the procedure. Local anesthesia was provided with 1% lidocaine. After creating a small venotomy incision, a 21 gauge needle was advanced into the right internal jugular vein under direct, real-time ultrasound guidance. Ultrasound image documentation was performed. After securing guidewire access, an 8 Fr dilator was placed. A J-wire was kinked to measure appropriate catheter length. A subcutaneous port pocket was then created along the upper chest wall utilizing sharp and blunt dissection. Portable cautery was utilized. The pocket was irrigated with sterile saline. A single lumen power injectable port was chosen for placement. The 8 Fr catheter was tunneled from the port pocket site to the venotomy incision. The port was placed in the pocket. External catheter was trimmed to appropriate length based on guidewire measurement. At the venotomy, an 8 Fr peel-away sheath was placed over a  guidewire. The catheter was then placed through the sheath and the sheath removed. Final catheter positioning was confirmed and documented with a fluoroscopic spot image. The port was accessed with a needle and aspirated and flushed with heparinized saline. The access needle was removed. The venotomy and port pocket incisions were closed with subcutaneous 3-0 Monocryl and subcuticular 4-0 Vicryl. Dermabond was applied to both incisions. COMPLICATIONS:  COMPLICATIONS None FINDINGS: After catheter placement, the tip lies at the cavo-atrial junction. The catheter aspirates normally and is ready for immediate use. IMPRESSION: Placement of single lumen port a cath via right internal jugular vein. The catheter tip lies at the cavo-atrial junction. A power injectable port a cath was placed and is ready for immediate use. Electronically Signed   By: Aletta Edouard M.D.   On: 07/27/2021 12:58    PERFORMANCE STATUS (ECOG) : 2 - Symptomatic, <50% confined to bed  Review of Systems Unless otherwise noted, a complete review of systems is negative.  Physical Exam General: NAD Pulmonary: Unlabored Abdomen: soft, nontender, + bowel sounds GU: no suprapubic tenderness Extremities: no edema, no joint deformities Skin: no rashes Neurological: Weakness but otherwise nonfocal  IMPRESSION: Post hospital follow-up.  Patient reports he is doing better but is slow and unsteady on his feet and requiring assistance from wife for ADLs.  Does not appear that he was sent home with home health, although we discussed that today.  Patient and wife want to think about home health services but did agree to meet with clinic OT for rehab screening.  I suggested that we could order home DME such as a walker but patient agreed to a cane.  Symptomatically, he has had some back pain and takes oxycodone for that.  He has thrush and poor oral intake.  He is starting on oral nutritional supplements and will speak with nutritionist  today.  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  We briefly discussed plans in the future when he is unable healthwise to participate in the business and he stated that that had "all been taking care of".  I discussed the option of social work support in the cancer center if needed.  PLAN: -Continue current scope of treatment -Oxycodone as needed for pain -Daily bowel regimen -Referral to rehab screening -DME: Cane -RTC 3 to 4 weeks   Patient expressed understanding and was in agreement with this plan. He also understands that He can call the clinic at any time with any questions, concerns, or complaints.     Time Total: 20 minutes  Visit consisted of counseling and education dealing with the complex and emotionally intense issues of symptom management and palliative care in the setting of serious and potentially life-threatening illness.Greater than 50%  of this time was spent counseling and coordinating care related to the above assessment and plan.  Signed by: Altha Harm, PhD, NP-C

## 2021-08-25 NOTE — Progress Notes (Signed)
Nutrition Follow-up:   Patient with small cell lung cancer with brain, omentum, pancreatic, and adrenal metastases.  Patient completed whole brain radiation on 7/21.  Patient has started carboplatin, etoposide and tecentriq.  Met with patient and wife following MD visit.  Infusion held today.  Patient with recent hospitalization.  Patient with thursh, decreased appetite and weight loss.      Medications: nystatin, marinol added today  Labs: reviewed  Anthropometrics:   Weight 200 lb today  211 lb on 7/24 212 lb on 7/12  UBW of 224-227 lb   NUTRITION DIAGNOSIS: Inadequate oral intake ongoing   INTERVENTION:  Discussed higher calorie shake and sample of ensure complete, [employer] 1.4 and boost VHC given to patient to try.  Agree with appetite stimulant Reviewed ways to add calories and protein in diet.     MONITORING, EVALUATION, GOAL: weight trends, intake   NEXT VISIT: Thursday, August 24 during infusion  Kailei Cowens B. [name], [place], [place] Registered Dietitian [phone]

## 2021-08-25 NOTE — Progress Notes (Signed)
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  He had a lung cancer screening protocol on 05/29/2021 which showed a right lower lobe lung mass with concern for bilateral adrenal masses.  This was followed by a PET CT scan on 06/15/2021 which showed a right lower lobe soft tissue mass with an SUV uptake of 16.6.  No hypermetabolic hilar or mediastinal adenopathy.  Bilateral hypermetabolic adrenal masses 6.7 and 5.1 cm respectively.  2.8 cm soft tissue lesion in the body of the pancreas and 1.9 cm soft tissue lesion in the tail of the pancreas.  2.3 cm retroperitoneal soft tissue nodule.  10 cm x 6.4 cm soft tissue mass inferior to the transverse colon in the midline.  4.1 x 2.4 cm right mesenteric soft tissue mass with an SUV of 11.7.   MRI brain was significant for multiple areas of intracranial metastatic disease.  Largest lesion in the left frontal lobe with significant edema internal hemorrhage and is causing a mass effect with 6 mm midline shift.  30 metastatic foci seen in the brain.  Patient is undergoing whole brain radiation for the same.     Abdominal mass biopsy showed high-grade carcinoma compatible with metastatic small cell carcinoma of lung origin.    Interval history- reports ongoing fatigue. Has exertional SOB.  Just completed whole brain radiation treatment last week  ECOG PS- 2 Pain scale- 2 Opioid associated constipation- no  INTERVAL  HISTORY Maurice Simpson is a 66 y.o. male who has above history reviewed by me today presents for follow up visit for management of Extensive small cell lung cancer with brain metastasis. Patient follows up with Dr.Rao who is off today, I am covering her to see this patient. 08/12/2021 - 08/15/2021, patient was hospitalized due to neutropenic fever, Pseudomonas bacteremia, postobstructive pneumonia.  Patient was treated with broad-spectrum antibiotics with vancomycin and cefepime, vancomycin was discontinued later on.  Patient was seen by infectious disease physician.  Echocardiogram done did not show any vegetation.  Pseudomonas was pansensitive.  Patient was discharged with Cipro 750 mg twice daily for a total of 2 weeks duration.  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  He was also put on Valtrex. Post discharge, patient was seen by Dr. Delaine Lame on 08/20/2021. patient will be finishing 2 weeks antibiotics on 08/27/2021.  He has lost weight since last office visit. He has no appetite, food does not taste well.  Right ear pain, evaluated by ENT and was told he has fungal infection, he has been advised to use antibiotic ear drops which improved his ear pain.    Review of systems- Review of Systems  Constitutional:  Positive for malaise/fatigue and weight loss. Negative for chills and fever.  HENT:  Negative for congestion, ear discharge and nosebleeds.   Eyes:  Negative for blurred vision.  Respiratory:  Positive for shortness of breath. Negative for cough, hemoptysis, sputum production and wheezing.   Cardiovascular:  Negative for chest pain, palpitations, orthopnea and claudication.  Gastrointestinal:  Negative for abdominal pain, blood in stool, constipation, diarrhea, heartburn, melena, nausea and vomiting.  Genitourinary:  Negative for dysuria, flank pain, frequency, hematuria and urgency.  Musculoskeletal:  Negative for back pain, joint  pain and myalgias.  Skin:  Negative for rash.  Neurological:  Negative for dizziness, tingling, focal weakness, seizures, weakness and headaches.  Endo/Heme/Allergies:  Does not bruise/bleed easily.  Psychiatric/Behavioral:  Negative for depression and suicidal ideas. The patient does not have insomnia.       Allergies  Allergen Reactions   Bee Venom      Past Medical History:  Diagnosis Date   Cancer ([place])    COPD (chronic obstructive pulmonary disease) ([place])    Dyspnea      Past Surgical History:  Procedure Laterality Date   CATARACT EXTRACTION Right 03/10/2021   IR IMAGING GUIDED PORT INSERTION  07/27/2021    Social History   Socioeconomic History   Marital status: Married    Spouse name: Not on file   Number of children: Not on file   Years of education: Not on file   Highest education level: Not on file  Occupational History   Not on file  Tobacco Use   Smoking status: Every Day    Packs/day: 2.00    Years: 51.00    Total pack years: 102.00    Types: Cigarettes   Smokeless tobacco: Never  Vaping Use   Vaping Use: Never used  Substance and Sexual Activity   Alcohol use: Not Currently   Drug use: Never   Sexual activity: Yes  Other Topics Concern   Not on file  Social History Narrative   Not on file   Social Determinants of Health   Financial Resource Strain: Low Risk  (07/13/2021)   Overall Financial Resource Strain (CARDIA)    Difficulty of Paying Living Expenses: Not hard at all  Food Insecurity: No Food Insecurity (07/13/2021)   Hunger Vital Sign    Worried About Running Out of Food in the Last Year: Never true    Ran Out of Food in the Last Year: Never true  Transportation Needs: No Transportation Needs (07/13/2021)   PRAPARE - Hydrologist (Medical): No    Lack of Transportation (Non-Medical): No  Physical Activity: Inactive (07/20/2019)   Exercise Vital Sign    Days of Exercise per Week: 0 days    Minutes of Exercise  per Session: 0 min  Stress: Stress Concern Present (07/13/2021)   [place]    Feeling of Stress : To some extent  Social Connections: Moderately Isolated (07/13/2021)   Social Connection and Isolation Panel [NHANES]    Frequency of Communication with Friends and Family: More than three times a week    Frequency of Social Gatherings with Friends and Family: Twice a week    Attends Religious Services: Never    Marine scientist or Organizations: No    Attends Archivist Meetings: Never    Marital Status: Married  Human resources officer Violence: Not At Risk (07/13/2021)   Humiliation, Afraid, Rape, and Kick questionnaire    Fear of Current or Ex-Partner: No    Emotionally Abused: No    Physically Abused: No    Sexually Abused: No    Family History  Problem Relation Age of Onset   Diabetes Mother    Hypertension Mother    Diabetes  Father    Hypertension Father    Diabetes Sister    Cancer - Colon Cousin      Current Outpatient Medications:    albuterol (PROVENTIL) (2.5 MG/3ML) 0.083% nebulizer solution, Take 3 mLs (2.5 mg total) by nebulization every 6 (six) hours as needed for wheezing or shortness of breath., Disp: 75 mL, Rfl: 12   budesonide (PULMICORT) 0.25 MG/2ML nebulizer solution, Take 2 mLs (0.25 mg total) by nebulization 2 (two) times daily as needed., Disp: 60 mL, Rfl: 12   ciprofloxacin (CIPRO) 750 MG tablet, Take 1 tablet (750 mg total) by mouth 2 (two) times daily for 21 days., Disp: 42 tablet, Rfl: 0   dexamethasone (DECADRON) 4 MG tablet, Take 1 tablet by mouth twice a day for 7 days, then take 1 tablet by mouth daily for 7 days, then take 0.5 tablet by mouth daily for 7 days, then stop, Disp: 30 tablet, Rfl: 0   lidocaine-prilocaine (EMLA) cream, Apply to affected area once, Disp: 30 g, Rfl: 3   LORazepam (ATIVAN) 0.5 MG tablet, Take 1 tablet (0.5 mg total) by mouth every 6 (six) hours as needed  (Nausea or vomiting)., Disp: 30 tablet, Rfl: 0   magic mouthwash w/lidocaine SOLN, Take 5 mLs by mouth 4 (four) times daily., Disp: 480 mL, Rfl: 0   ondansetron (ZOFRAN) 4 MG tablet, Take 1 tablet (4 mg total) by mouth every 8 (eight) hours as needed for nausea or vomiting., Disp: 60 tablet, Rfl: 3   oxyCODONE (OXY IR/ROXICODONE) 5 MG immediate release tablet, Take 1 tablet (5 mg total) by mouth every 4 (four) hours as needed for severe pain., Disp: 60 tablet, Rfl: 0   pantoprazole (PROTONIX) 20 MG tablet, TAKE 1 TABLET BY MOUTH EVERY DAY, Disp: 90 tablet, Rfl: 0   prochlorperazine (COMPAZINE) 10 MG tablet, Take 1 tablet (10 mg total) by mouth every 6 (six) hours as needed for nausea or vomiting., Disp: 60 tablet, Rfl: 2   traZODone (DESYREL) 50 MG tablet, TAKE 1 TABLET BY MOUTH AT BEDTIME AS NEEDED FOR SLEEP., Disp: 90 tablet, Rfl: 1   valACYclovir (VALTREX) 1000 MG tablet, Take 1 tablet (1,000 mg total) by mouth daily., Disp: 10 tablet, Rfl: 10  Physical exam:  There were no vitals filed for this visit.  Physical Exam HENT:     Mouth/Throat:     Comments: thrush Cardiovascular:     Rate and Rhythm: Normal rate and regular rhythm.     Heart sounds: Normal heart sounds.  Pulmonary:     Effort: Pulmonary effort is normal.     Comments: Scattered bilateral wheezing Abdominal:     General: Bowel sounds are normal.     Palpations: Abdomen is soft.  Skin:    General: Skin is warm and dry.  Neurological:     Mental Status: He is alert and oriented to person, place, and time.         Latest Ref Rng & Units 08/14/2021    5:20 AM  CMP  Glucose 70 - 99 mg/dL 82   BUN 8 - 23 mg/dL 11   Creatinine 0.61 - 1.24 mg/dL 0.65   Sodium 135 - 145 mmol/L 136   Potassium 3.5 - 5.1 mmol/L 3.8   Chloride 98 - 111 mmol/L 103   CO2 22 - 32 mmol/L 27   Calcium 8.9 - 10.3 mg/dL 8.8       Latest Ref Rng & Units 08/14/2021    5:20 AM  CBC  WBC  4.0 - 10.5 K/uL 1.6   Hemoglobin 13.0 - 17.0 g/dL 12.7    Hematocrit 39.0 - 52.0 % 35.8   Platelets 150 - 400 K/uL 39     No images are attached to the encounter.  ECHOCARDIOGRAM COMPLETE  Result Date: 08/14/2021    ECHOCARDIOGRAM REPORT   Patient Name:   [facility] Date of Exam: 08/14/2021 Medical Rec #:  [id number]    Height:       68.0 in Accession #:    [id number]   Weight:       208.8 lb Date of Birth:  [birth year]    BSA:          2.081 m Patient Age:    28 years     BP:           110/63 mmHg Patient Gender: M            HR:           80 bpm. Exam Location:  [facility] Procedure: 2D Echo, Color Doppler and Cardiac Doppler Indications:     R78.81 Bacteremia  History:         Patient has prior history of Echocardiogram examinations, most                  recent 06/17/2021. COPD, Signs/Symptoms:Fever; Risk                  Factors:Current Smoker.  Sonographer:     Charmayne Sheer Referring Phys:  [id number] Tsosie Billing Diagnosing Phys: Nelva Bush MD  Sonographer Comments: Technically difficult study due to poor echo windows. Image acquisition challenging due to patient body habitus and Image acquisition challenging due to COPD. IMPRESSIONS  1. Left ventricular ejection fraction, by estimation, is 60 to 65%. The left ventricle has normal function. Left ventricular endocardial border not optimally defined to evaluate regional wall motion. Left ventricular diastolic parameters were normal.  2. Right ventricular systolic function is normal. The right ventricular size is normal.  3. The mitral valve is grossly normal. Trivial mitral valve regurgitation. No evidence of mitral stenosis.  4. The aortic valve was not well visualized. Aortic valve regurgitation is not visualized. No aortic stenosis is present.  5. The inferior vena cava is normal in size with greater than 50% respiratory variability, suggesting right atrial pressure of 3 mmHg. FINDINGS  Left Ventricle: Left ventricular ejection fraction, by estimation, is 60 to 65%. The left ventricle has normal function.  Left ventricular endocardial border not optimally defined to evaluate regional wall motion. The left ventricular internal cavity size was normal in size. There is no left ventricular hypertrophy. Left ventricular diastolic parameters were normal. Right Ventricle: The right ventricular size is normal. No increase in right ventricular wall thickness. Right ventricular systolic function is normal. Left Atrium: Left atrial size was normal in size. Right Atrium: Right atrial size was normal in size. Pericardium: There is no evidence of pericardial effusion. Mitral Valve: The mitral valve is grossly normal. Trivial mitral valve regurgitation. No evidence of mitral valve stenosis. Tricuspid Valve: The tricuspid valve is normal in structure. Tricuspid valve regurgitation is not demonstrated. Aortic Valve: The aortic valve was not well visualized. Aortic valve regurgitation is not visualized. No aortic stenosis is present. Aortic valve mean gradient measures 4.0 mmHg. Aortic valve peak gradient measures 9.6 mmHg. Aortic valve area, by VTI measures 2.13 cm. Pulmonic Valve: The pulmonic valve was not well visualized. Pulmonic valve regurgitation is not visualized. No evidence of  pulmonic stenosis. Aorta: The aortic root is normal in size and structure. Pulmonary Artery: The pulmonary artery is not well seen. Venous: The inferior vena cava is normal in size with greater than 50% respiratory variability, suggesting right atrial pressure of 3 mmHg. IAS/Shunts: The interatrial septum was not well visualized.  LEFT VENTRICLE PLAX 2D LVIDd:         4.57 cm   Diastology LVIDs:         2.72 cm   LV e' medial:    7.83 cm/s LV PW:         0.94 cm   LV E/e' medial:  8.6 LV IVS:        0.96 cm   LV e' lateral:   9.90 cm/s LVOT diam:     1.90 cm   LV E/e' lateral: 6.8 LV SV:         58 LV SV Index:   28 LVOT Area:     2.84 cm  RIGHT VENTRICLE RV Basal diam:  3.20 cm LEFT ATRIUM             Index        RIGHT ATRIUM           Index LA  diam:        2.70 cm 1.30 cm/m   RA Area:     10.30 cm LA Vol (A2C):   30.1 ml 14.46 ml/m  RA Volume:   18.40 ml  8.84 ml/m LA Vol (A4C):   32.5 ml 15.61 ml/m LA Biplane Vol: 31.8 ml 15.28 ml/m  AORTIC VALVE                    PULMONIC VALVE AV Area (Vmax):    1.94 cm     PV Vmax:       0.90 m/s AV Area (Vmean):   2.21 cm     PV Peak grad:  3.2 mmHg AV Area (VTI):     2.13 cm AV Vmax:           155.00 cm/s AV Vmean:          93.400 cm/s AV VTI:            0.274 m AV Peak Grad:      9.6 mmHg AV Mean Grad:      4.0 mmHg LVOT Vmax:         106.00 cm/s LVOT Vmean:        72.900 cm/s LVOT VTI:          0.206 m LVOT/AV VTI ratio: 0.75  AORTA Ao Root diam: 3.50 cm MITRAL VALVE MV Area (PHT): 2.84 cm    SHUNTS MV Decel Time: 267 msec    Systemic VTI:  0.21 m MV E velocity: 67.30 cm/s  Systemic Diam: 1.90 cm MV A velocity: 73.70 cm/s MV E/A ratio:  0.91 Harrell Gave End MD Electronically signed by Nelva Bush MD Signature Date/Time: 08/14/2021/1:51:17 PM    Final    DG Chest 2 View  Result Date: 08/12/2021 CLINICAL DATA:  Sepsis, syncope, short of breath EXAM: CHEST - 2 VIEW COMPARISON:  04/30/2021 FINDINGS: 3 Port in the anterior chest wall with tip in distal SVC. Normal mediastinum and cardiac silhouette. Normal pulmonary vasculature Density over the heart on lateral projection is favored RIGHT middle lobe opacity. No pleural fluid IMPRESSION: Concern for RIGHT middle lobe pneumonia or atelectasis Electronically Signed   By: Suzy Bouchard M.D.   On: 08/12/2021  13:17   CT Angio Chest Pulmonary Embolism (PE) W or WO Contrast  Result Date: 08/07/2021 CLINICAL DATA:  Chest pain. EXAM: CT ANGIOGRAPHY CHEST WITH CONTRAST TECHNIQUE: Multidetector CT imaging of the chest was performed using the standard protocol during bolus administration of intravenous contrast. Multiplanar CT image reconstructions and MIPs were obtained to evaluate the vascular anatomy. RADIATION DOSE REDUCTION: This exam was performed  according to the departmental dose-optimization program which includes automated exposure control, adjustment of the mA and/or kV according to patient size and/or use of iterative reconstruction technique. CONTRAST:  31mL OMNIPAQUE IOHEXOL 350 MG/ML SOLN COMPARISON:  May 29, 2021. FINDINGS: Cardiovascular: Satisfactory opacification of the pulmonary arteries to the segmental level. No evidence of pulmonary embolism. Normal heart size. No pericardial effusion. Mediastinum/Nodes: No enlarged mediastinal, hilar, or axillary lymph nodes. Thyroid gland, trachea, and esophagus demonstrate no significant findings. Lungs/Pleura: No pneumothorax or pleural effusion is noted. 5.5 x 3.8 cm right infrahilar mass is again noted consistent with malignancy. Mild postobstructive atelectasis is noted. Upper Abdomen: Bilateral adrenal masses are noted concerning for metastatic disease. Musculoskeletal: No chest wall abnormality. No acute or significant osseous findings. Review of the MIP images confirms the above findings. IMPRESSION: No definite evidence of pulmonary embolus. 5.5 x 3.8 cm right infrahilar mass is noted consistent with malignancy with associated postobstructive atelectasis. Bilateral adrenal masses are noted concerning for metastatic disease. Electronically Signed   By: Marijo Conception M.D.   On: 08/07/2021 16:17   IR IMAGING GUIDED PORT INSERTION  Result Date: 07/27/2021 CLINICAL DATA:  Metastatic small cell lung carcinoma and need for porta cath for chemotherapy. EXAM: IMPLANTED PORT A CATH PLACEMENT WITH ULTRASOUND AND FLUOROSCOPIC GUIDANCE ANESTHESIA/SEDATION: Moderate (conscious) sedation was employed during this procedure. A total of Versed 2.0 mg and Fentanyl 100 mcg was administered intravenously by radiology nursing. Moderate Sedation Time: 20 minutes. The patient's level of consciousness and vital signs were monitored continuously by radiology nursing throughout the procedure under my direct  supervision. FLUOROSCOPY: 36 seconds.  11.4 mGy. PROCEDURE: The procedure, risks, benefits, and alternatives were explained to the patient. Questions regarding the procedure were encouraged and answered. The patient understands and consents to the procedure. A time-out was performed prior to initiating the procedure. Ultrasound was utilized to confirm patency of the right internal jugular vein. A permanent ultrasound image was recorded. The right neck and chest were prepped with chlorhexidine in a sterile fashion, and a sterile drape was applied covering the operative field. Maximum barrier sterile technique with sterile gowns and gloves were used for the procedure. Local anesthesia was provided with 1% lidocaine. After creating a small venotomy incision, a 21 gauge needle was advanced into the right internal jugular vein under direct, real-time ultrasound guidance. Ultrasound image documentation was performed. After securing guidewire access, an 8 Fr dilator was placed. A J-wire was kinked to measure appropriate catheter length. A subcutaneous port pocket was then created along the upper chest wall utilizing sharp and blunt dissection. Portable cautery was utilized. The pocket was irrigated with sterile saline. A single lumen power injectable port was chosen for placement. The 8 Fr catheter was tunneled from the port pocket site to the venotomy incision. The port was placed in the pocket. External catheter was trimmed to appropriate length based on guidewire measurement. At the venotomy, an 8 Fr peel-away sheath was placed over a guidewire. The catheter was then placed through the sheath and the sheath removed. Final catheter positioning was confirmed and documented with a fluoroscopic  spot image. The port was accessed with a needle and aspirated and flushed with heparinized saline. The access needle was removed. The venotomy and port pocket incisions were closed with subcutaneous 3-0 Monocryl and subcuticular 4-0  Vicryl. Dermabond was applied to both incisions. COMPLICATIONS: COMPLICATIONS None FINDINGS: After catheter placement, the tip lies at the cavo-atrial junction. The catheter aspirates normally and is ready for immediate use. IMPRESSION: Placement of single lumen port a cath via right internal jugular vein. The catheter tip lies at the cavo-atrial junction. A power injectable port a cath was placed and is ready for immediate use. Electronically Signed   By: Aletta Edouard M.D.   On: 07/27/2021 12:58     Assessment and plan- Patient is a 66 y.o. male with stage IVb extensive stage small cell lung cancer cT2 N0 M1 with brain and adrenal gland pancreatic and omental metastases. He is here for on treatment assessment prior to cycle 2 of carbo etoposide Tecentriq chemotherapy 1. Small cell lung cancer ([place])   2. Lung cancer metastatic to brain ([place])   3. Encounter for antineoplastic chemotherapy   4. Bacteremia due to Pseudomonas   5. Weight loss   6. Thrush   7. Chemotherapy-induced nausea     #Extensive small cell lung cancer Currently on palliative chemotherapy with carboplatin/etoposide/Tecentriq Cycle 1 treatment was complicated with neutropenic fever, obstructive pneumonia, pseudomonal bacteremia. Recommend patient to finish his antibiotics course. Hold off chemotherapy today. Rescheduled chemotherapy to next week-  #Bacteremia due to Pseudomonas, ANC has improved.  Repeat culture on 08/14/2021 was negative.   Finish course of Cipro  # Oral Thrush, recommend nystatin oral rinse, swish and swallow. Rx sent.  # Weight loss, he will see nutritionist today. Recommend appetite stimulant Marinol 5mg  BID for both weight loss and chemotherapy induced nausea.   Brain metastases: S/p whole brain radiation treatment.    Follow up with Dr.Rao in 1 week lab MD chemo  Earlie Server, MD, PhD [facility] Hematology Oncology 08/25/2021

## 2021-08-26 ENCOUNTER — Other Ambulatory Visit: Payer: Self-pay | Admitting: *Deleted

## 2021-08-26 ENCOUNTER — Inpatient Hospital Stay: Payer: Medicare HMO

## 2021-08-26 LAB — T4: T4, Total: 7.9 ug/dL (ref 4.5–12.0)

## 2021-08-26 MED ORDER — POTASSIUM CHLORIDE CRYS ER 20 MEQ PO TBCR
20.0000 meq | EXTENDED_RELEASE_TABLET | Freq: Two times a day (BID) | ORAL | 0 refills | Status: DC
Start: 2021-08-26 — End: 2021-09-09

## 2021-08-27 ENCOUNTER — Ambulatory Visit: Payer: Medicare HMO

## 2021-08-27 ENCOUNTER — Inpatient Hospital Stay: Payer: Medicare HMO

## 2021-08-28 ENCOUNTER — Encounter: Payer: Self-pay | Admitting: *Deleted

## 2021-08-28 ENCOUNTER — Other Ambulatory Visit: Payer: Self-pay | Admitting: *Deleted

## 2021-08-28 ENCOUNTER — Ambulatory Visit: Payer: Medicare HMO | Admitting: Radiation Oncology

## 2021-08-28 MED ORDER — DRONABINOL 5 MG PO CAPS: 5.0000 mg | ORAL_CAPSULE | Freq: Two times a day (BID) | ORAL | 0 refills | Status: DC

## 2021-08-31 ENCOUNTER — Encounter: Payer: Self-pay | Admitting: *Deleted

## 2021-08-31 ENCOUNTER — Other Ambulatory Visit: Payer: Self-pay | Admitting: *Deleted

## 2021-08-31 DIAGNOSIS — C3490 Malignant neoplasm of unspecified part of unspecified bronchus or lung: Secondary | ICD-10-CM

## 2021-08-31 MED FILL — Dexamethasone Sodium Phosphate Inj 100 MG/10ML: INTRAMUSCULAR | Qty: 1 | Status: AC

## 2021-09-01 ENCOUNTER — Inpatient Hospital Stay: Payer: Medicare HMO

## 2021-09-01 ENCOUNTER — Encounter: Payer: Self-pay | Admitting: *Deleted

## 2021-09-01 ENCOUNTER — Other Ambulatory Visit: Payer: Self-pay | Admitting: *Deleted

## 2021-09-01 ENCOUNTER — Encounter: Payer: Self-pay | Admitting: Oncology

## 2021-09-01 ENCOUNTER — Inpatient Hospital Stay (HOSPITAL_BASED_OUTPATIENT_CLINIC_OR_DEPARTMENT_OTHER): Payer: Medicare HMO | Admitting: Oncology

## 2021-09-01 ENCOUNTER — Inpatient Hospital Stay: Payer: Medicare HMO | Admitting: Hospice and Palliative Medicine

## 2021-09-01 VITALS — BP 122/73 | HR 87 | Temp 98.6°F | Resp 18 | Wt 200.3 lb

## 2021-09-01 DIAGNOSIS — E876 Hypokalemia: Secondary | ICD-10-CM

## 2021-09-01 DIAGNOSIS — G893 Neoplasm related pain (acute) (chronic): Secondary | ICD-10-CM

## 2021-09-01 DIAGNOSIS — C7931 Secondary malignant neoplasm of brain: Secondary | ICD-10-CM | POA: Diagnosis not present

## 2021-09-01 DIAGNOSIS — C3490 Malignant neoplasm of unspecified part of unspecified bronchus or lung: Secondary | ICD-10-CM

## 2021-09-01 DIAGNOSIS — C7970 Secondary malignant neoplasm of unspecified adrenal gland: Secondary | ICD-10-CM | POA: Diagnosis not present

## 2021-09-01 DIAGNOSIS — C786 Secondary malignant neoplasm of retroperitoneum and peritoneum: Secondary | ICD-10-CM | POA: Diagnosis not present

## 2021-09-01 DIAGNOSIS — C3431 Malignant neoplasm of lower lobe, right bronchus or lung: Secondary | ICD-10-CM | POA: Diagnosis not present

## 2021-09-01 DIAGNOSIS — Z5111 Encounter for antineoplastic chemotherapy: Secondary | ICD-10-CM

## 2021-09-01 DIAGNOSIS — Z5112 Encounter for antineoplastic immunotherapy: Secondary | ICD-10-CM | POA: Diagnosis not present

## 2021-09-01 DIAGNOSIS — C7889 Secondary malignant neoplasm of other digestive organs: Secondary | ICD-10-CM | POA: Diagnosis not present

## 2021-09-01 DIAGNOSIS — D701 Agranulocytosis secondary to cancer chemotherapy: Secondary | ICD-10-CM | POA: Diagnosis not present

## 2021-09-01 LAB — CBC WITH DIFFERENTIAL/PLATELET
Abs Immature Granulocytes: 0.15 10*3/uL — ABNORMAL HIGH (ref 0.00–0.07)
Basophils Absolute: 0.2 10*3/uL — ABNORMAL HIGH (ref 0.0–0.1)
Basophils Relative: 2 %
Eosinophils Absolute: 0.3 10*3/uL (ref 0.0–0.5)
Eosinophils Relative: 2 %
HCT: 41.6 % (ref 39.0–52.0)
Hemoglobin: 14.5 g/dL (ref 13.0–17.0)
Immature Granulocytes: 1 %
Lymphocytes Relative: 23 %
Lymphs Abs: 2.6 10*3/uL (ref 0.7–4.0)
MCH: 31.7 pg (ref 26.0–34.0)
MCHC: 34.9 g/dL (ref 30.0–36.0)
MCV: 90.8 fL (ref 80.0–100.0)
Monocytes Absolute: 1.1 10*3/uL — ABNORMAL HIGH (ref 0.1–1.0)
Monocytes Relative: 10 %
Neutro Abs: 7 10*3/uL (ref 1.7–7.7)
Neutrophils Relative %: 62 %
Platelets: 401 10*3/uL — ABNORMAL HIGH (ref 150–400)
RBC: 4.58 MIL/uL (ref 4.22–5.81)
RDW: 13.9 % (ref 11.5–15.5)
WBC: 11.4 10*3/uL — ABNORMAL HIGH (ref 4.0–10.5)
nRBC: 0 % (ref 0.0–0.2)

## 2021-09-01 LAB — COMPREHENSIVE METABOLIC PANEL
ALT: 18 U/L (ref 0–44)
AST: 22 U/L (ref 15–41)
Albumin: 3.2 g/dL — ABNORMAL LOW (ref 3.5–5.0)
Alkaline Phosphatase: 63 U/L (ref 38–126)
Anion gap: 8 (ref 5–15)
BUN: 10 mg/dL (ref 8–23)
CO2: 26 mmol/L (ref 22–32)
Calcium: 8.9 mg/dL (ref 8.9–10.3)
Chloride: 104 mmol/L (ref 98–111)
Creatinine, Ser: 0.94 mg/dL (ref 0.61–1.24)
GFR, Estimated: >60 mL/min (ref >60)
Glucose, Bld: 127 mg/dL — ABNORMAL HIGH (ref 70–99)
Potassium: 3 mmol/L — ABNORMAL LOW (ref 3.5–5.1)
Sodium: 138 mmol/L (ref 135–145)
Total Bilirubin: 0.8 mg/dL (ref 0.3–1.2)
Total Protein: 7.6 g/dL (ref 6.5–8.1)

## 2021-09-01 LAB — T4, FREE: Free T4: 0.71 ng/dL (ref 0.61–1.12)

## 2021-09-01 LAB — TSH: TSH: 1.585 u[IU]/mL (ref 0.350–4.500)

## 2021-09-01 MED ORDER — SODIUM CHLORIDE 0.9 % IV SOLN
1200.0000 mg | Freq: Once | INTRAVENOUS | Status: AC
  Administered 2021-09-01: 1200 mg via INTRAVENOUS
  Filled 2021-09-01: qty 20

## 2021-09-01 MED ORDER — SODIUM CHLORIDE 0.9 % IV SOLN
150.0000 mg | Freq: Once | INTRAVENOUS | Status: AC
  Administered 2021-09-01: 150 mg via INTRAVENOUS
  Filled 2021-09-01 (×3): qty 5

## 2021-09-01 MED ORDER — PALONOSETRON HCL INJECTION 0.25 MG/5ML
0.2500 mg | Freq: Once | INTRAVENOUS | Status: AC
  Administered 2021-09-01: 0.25 mg via INTRAVENOUS
  Filled 2021-09-01: qty 5

## 2021-09-01 MED ORDER — POTASSIUM CHLORIDE IN NACL 20-0.9 MEQ/L-% IV SOLN
Freq: Once | INTRAVENOUS | Status: AC
  Filled 2021-09-01: qty 1000

## 2021-09-01 MED ORDER — SODIUM CHLORIDE 0.9 % IV SOLN
Freq: Once | INTRAVENOUS | Status: AC
  Filled 2021-09-01: qty 250

## 2021-09-01 MED ORDER — HEPARIN SOD (PORK) LOCK FLUSH 100 UNIT/ML IV SOLN
INTRAVENOUS | Status: AC
  Administered 2021-09-01: 500 [IU]
  Filled 2021-09-01: qty 5

## 2021-09-01 MED ORDER — SODIUM CHLORIDE 0.9 % IV SOLN
100.0000 mg/m2 | Freq: Once | INTRAVENOUS | Status: AC
  Administered 2021-09-01: 220 mg via INTRAVENOUS
  Filled 2021-09-01: qty 11

## 2021-09-01 MED ORDER — HEPARIN SOD (PORK) LOCK FLUSH 100 UNIT/ML IV SOLN
500.0000 [IU] | Freq: Once | INTRAVENOUS | Status: AC | PRN
  Filled 2021-09-01: qty 5

## 2021-09-01 MED ORDER — SODIUM CHLORIDE 0.9 % IV SOLN
10.0000 mg | Freq: Once | INTRAVENOUS | Status: AC
  Administered 2021-09-01: 10 mg via INTRAVENOUS
  Filled 2021-09-01 (×2): qty 1
  Filled 2021-09-01: qty 10

## 2021-09-01 MED ORDER — SODIUM CHLORIDE 0.9 % IV SOLN
492.4000 mg | Freq: Once | INTRAVENOUS | Status: AC
  Administered 2021-09-01: 490 mg via INTRAVENOUS
  Filled 2021-09-01: qty 49

## 2021-09-01 MED FILL — Dexamethasone Sodium Phosphate Inj 100 MG/10ML: INTRAMUSCULAR | Qty: 1 | Status: AC

## 2021-09-01 NOTE — Progress Notes (Signed)
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  He had a lung cancer screening protocol on 05/29/2021 which showed a right lower lobe lung mass with concern for bilateral adrenal masses.  This was followed by a PET CT scan on 06/15/2021 which showed a right lower lobe soft tissue mass with an SUV uptake of 16.6.  No hypermetabolic hilar or mediastinal adenopathy.  Bilateral hypermetabolic adrenal masses 6.7 and 5.1 cm respectively.  2.8 cm soft tissue lesion in the body of the pancreas and 1.9 cm soft tissue lesion in the tail of the pancreas.  2.3 cm retroperitoneal soft tissue nodule.  10 cm x 6.4 cm soft tissue mass inferior to the transverse colon in the midline.  4.1 x 2.4 cm right mesenteric soft tissue mass with an SUV of 11.7.   MRI brain was significant for multiple areas of intracranial metastatic disease.  Largest lesion in the left frontal lobe with significant edema internal hemorrhage and is causing a mass effect with 6 mm midline shift.  30 metastatic foci seen in the brain.  Patient is undergoing whole brain radiation for the same.     Abdominal mass biopsy showed high-grade carcinoma compatible with metastatic small cell carcinoma of lung origin.      Interval history-cycle 1 of chemotherapy patient developed neutropenic fever complicated by severe sepsis from Pseudomonas requiring IV antibiotics.   He is now completing his oral antibiotics which she will finish in the next couple of days.Overall he feels much better.  He has not had any fevers.  Abdominal pain is better and he uses as needed oxycodone.  Symptoms of dizziness have improved  ECOG PS- 1 Pain scale- 0 Opioid associated constipation- no  Review of systems- Review of Systems  Constitutional:  Positive for malaise/fatigue. Negative for chills, fever and weight loss.  HENT:  Negative for congestion, ear discharge and nosebleeds.   Eyes:  Negative for blurred vision.  Respiratory:  Negative for cough, hemoptysis, sputum production, shortness of breath and wheezing.   Cardiovascular:  Negative for chest pain, palpitations, orthopnea and claudication.  Gastrointestinal:  Negative for abdominal pain, blood in stool, constipation, diarrhea, heartburn, melena, nausea and vomiting.  Genitourinary:  Negative for dysuria, flank pain, frequency, hematuria and urgency.  Musculoskeletal:  Negative for back pain, joint pain and myalgias.  Skin:  Negative for rash.  Neurological:  Negative for dizziness, tingling, focal weakness, seizures, weakness and headaches.  Endo/Heme/Allergies:  Does not bruise/bleed easily.  Psychiatric/Behavioral:  Negative for depression and suicidal ideas. The patient does not have insomnia.       Allergies  Allergen Reactions   Bee Venom      Past Medical History:  Diagnosis Date   Cancer ([place])    COPD (chronic obstructive pulmonary disease) ([place])    Dyspnea      Past Surgical History:  Procedure Laterality Date   CATARACT EXTRACTION Right 03/10/2021   IR IMAGING GUIDED PORT INSERTION  07/27/2021  Social History   Socioeconomic History   Marital status: Married    Spouse name: Not on file   Number of children: Not on file   Years of education: Not on file   Highest education level: Not on file  Occupational History   Not on file  Tobacco Use   Smoking status: Every Day    Packs/day:  2.00    Years: 51.00    Total pack years: 102.00    Types: Cigarettes   Smokeless tobacco: Never  Vaping Use   Vaping Use: Never used  Substance and Sexual Activity   Alcohol use: Not Currently   Drug use: Never   Sexual activity: Yes  Other Topics Concern   Not on file  Social History Narrative   Not on file   Social Determinants of Health   Financial Resource Strain: Low Risk  (07/13/2021)   Overall Financial Resource Strain (CARDIA)    Difficulty of Paying Living Expenses: Not hard at all  Food Insecurity: No Food Insecurity (07/13/2021)   Hunger Vital Sign    Worried About Running Out of Food in the Last Year: Never true    Ran Out of Food in the Last Year: Never true  Transportation Needs: No Transportation Needs (07/13/2021)   PRAPARE - Hydrologist (Medical): No    Lack of Transportation (Non-Medical): No  Physical Activity: Inactive (07/20/2019)   Exercise Vital Sign    Days of Exercise per Week: 0 days    Minutes of Exercise per Session: 0 min  Stress: Stress Concern Present (07/13/2021)   [place]    Feeling of Stress : To some extent  Social Connections: Moderately Isolated (07/13/2021)   Social Connection and Isolation Panel [NHANES]    Frequency of Communication with Friends and Family: More than three times a week    Frequency of Social Gatherings with Friends and Family: Twice a week    Attends Religious Services: Never    Marine scientist or Organizations: No    Attends Archivist Meetings: Never    Marital Status: Married  Human resources officer Violence: Not At Risk (07/13/2021)   Humiliation, Afraid, Rape, and Kick questionnaire    Fear of Current or Ex-Partner: No    Emotionally Abused: No    Physically Abused: No    Sexually Abused: No    Family History  Problem Relation Age of Onset   Diabetes Mother    Hypertension Mother    Diabetes Father     Hypertension Father    Diabetes Sister    Cancer - Colon Cousin      Current Outpatient Medications:    albuterol (PROVENTIL) (2.5 MG/3ML) 0.083% nebulizer solution, Take 3 mLs (2.5 mg total) by nebulization every 6 (six) hours as needed for wheezing or shortness of breath., Disp: 75 mL, Rfl: 12   budesonide (PULMICORT) 0.25 MG/2ML nebulizer solution, Take 2 mLs (0.25 mg total) by nebulization 2 (two) times daily as needed., Disp: 60 mL, Rfl: 12   ciprofloxacin (CIPRO) 750 MG tablet, Take 1 tablet (750 mg total) by mouth 2 (two) times daily for 21 days., Disp: 42 tablet, Rfl: 0   clotrimazole (LOTRIMIN) 1 % external solution, SMARTSIG:3-4 Drop(s) In Ear(s) Twice Daily, Disp: , Rfl:    nystatin (MYCOSTATIN) 100000 UNIT/ML suspension, Take 5 mLs (500,000 Units total) by mouth 3 (three) times daily., Disp: 180 mL, Rfl: 0   oxyCODONE (OXY  IR/ROXICODONE) 5 MG immediate release tablet, Take 1 tablet (5 mg total) by mouth every 4 (four) hours as needed for severe pain., Disp: 60 tablet, Rfl: 0   pantoprazole (PROTONIX) 20 MG tablet, TAKE 1 TABLET BY MOUTH EVERY DAY, Disp: 90 tablet, Rfl: 0   prochlorperazine (COMPAZINE) 10 MG tablet, Take 1 tablet (10 mg total) by mouth every 6 (six) hours as needed for nausea or vomiting., Disp: 60 tablet, Rfl: 2   valACYclovir (VALTREX) 1000 MG tablet, Take 1 tablet (1,000 mg total) by mouth daily., Disp: 10 tablet, Rfl: 10   dexamethasone (DECADRON) 4 MG tablet, Take 1 tablet by mouth twice a day for 7 days, then take 1 tablet by mouth daily for 7 days, then take 0.5 tablet by mouth daily for 7 days, then stop (Patient not taking: Reported on 09/01/2021), Disp: 30 tablet, Rfl: 0   dronabinol (MARINOL) 5 MG capsule, Take 1 capsule (5 mg total) by mouth 2 (two) times daily before lunch and supper. (Patient not taking: Reported on 09/01/2021), Disp: 30 capsule, Rfl: 0   NEOMYCIN-POLYMYXIN-HYDROCORTISONE (CORTISPORIN) 1 % SOLN OTIC solution, SMARTSIG:3-4 Drop(s) In Ear(s)  Twice Daily, Disp: , Rfl:    ondansetron (ZOFRAN) 4 MG tablet, Take 1 tablet (4 mg total) by mouth every 8 (eight) hours as needed for nausea or vomiting. (Patient not taking: Reported on 09/01/2021), Disp: 60 tablet, Rfl: 3   potassium chloride SA (KLOR-CON M) 20 MEQ tablet, Take 1 tablet (20 mEq total) by mouth 2 (two) times daily for 2 days. (Patient not taking: Reported on 09/01/2021), Disp: 4 tablet, Rfl: 0   traZODone (DESYREL) 50 MG tablet, TAKE 1 TABLET BY MOUTH AT BEDTIME AS NEEDED FOR SLEEP. (Patient not taking: Reported on 09/01/2021), Disp: 90 tablet, Rfl: 1  Physical exam:  Vitals:   09/01/21 0953  BP: 122/73  Pulse: 87  Resp: 18  Temp: 98.6 F (37 C)  SpO2: 97%  Weight: 200 lb 4.8 oz (90.9 kg)   Physical Exam Constitutional:      General: He is not in acute distress. Cardiovascular:     Rate and Rhythm: Normal rate and regular rhythm.     Heart sounds: Normal heart sounds.  Pulmonary:     Effort: Pulmonary effort is normal.     Breath sounds: Normal breath sounds.  Abdominal:     General: Bowel sounds are normal.     Palpations: Abdomen is soft.  Skin:    General: Skin is warm and dry.  Neurological:     Mental Status: He is alert and oriented to person, place, and time.         Latest Ref Rng & Units 09/01/2021    9:20 AM  CMP  Glucose 70 - 99 mg/dL 127   BUN 8 - 23 mg/dL 10   Creatinine 0.61 - 1.24 mg/dL 0.94   Sodium 135 - 145 mmol/L 138   Potassium 3.5 - 5.1 mmol/L 3.0   Chloride 98 - 111 mmol/L 104   CO2 22 - 32 mmol/L 26   Calcium 8.9 - 10.3 mg/dL 8.9   Total Protein 6.5 - 8.1 g/dL 7.6   Total Bilirubin 0.3 - 1.2 mg/dL 0.8   Alkaline Phos 38 - 126 U/L 63   AST 15 - 41 U/L 22   ALT 0 - 44 U/L 18       Latest Ref Rng & Units 09/01/2021    9:20 AM  CBC  WBC 4.0 - 10.5 K/uL 11.4  Hemoglobin 13.0 - 17.0 g/dL 14.5   Hematocrit 39.0 - 52.0 % 41.6   Platelets 150 - 400 K/uL 401     No images are attached to the encounter.  ECHOCARDIOGRAM  COMPLETE  Result Date: 08/14/2021    ECHOCARDIOGRAM REPORT   Patient Name:   LUM STILLINGER Date of Exam: 08/14/2021 Medical Rec #:  [id number]    Height:       68.0 in Accession #:    [id number]   Weight:       208.8 lb Date of Birth:  Feb 12, 1955    BSA:          2.081 m Patient Age:    27 years     BP:           110/63 mmHg Patient Gender: M            HR:           80 bpm. Exam Location:  [facility] Procedure: 2D Echo, Color Doppler and Cardiac Doppler Indications:     R78.81 Bacteremia  History:         Patient has prior history of Echocardiogram examinations, most                  recent 06/17/2021. COPD, Signs/Symptoms:Fever; Risk                  Factors:Current Smoker.  Sonographer:     Charmayne Sheer Referring Phys:  [id number] Tsosie Billing Diagnosing Phys: Nelva Bush MD  Sonographer Comments: Technically difficult study due to poor echo windows. Image acquisition challenging due to patient body habitus and Image acquisition challenging due to COPD. IMPRESSIONS  1. Left ventricular ejection fraction, by estimation, is 60 to 65%. The left ventricle has normal function. Left ventricular endocardial border not optimally defined to evaluate regional wall motion. Left ventricular diastolic parameters were normal.  2. Right ventricular systolic function is normal. The right ventricular size is normal.  3. The mitral valve is grossly normal. Trivial mitral valve regurgitation. No evidence of mitral stenosis.  4. The aortic valve was not well visualized. Aortic valve regurgitation is not visualized. No aortic stenosis is present.  5. The inferior vena cava is normal in size with greater than 50% respiratory variability, suggesting right atrial pressure of 3 mmHg. FINDINGS  Left Ventricle: Left ventricular ejection fraction, by estimation, is 60 to 65%. The left ventricle has normal function. Left ventricular endocardial border not optimally defined to evaluate regional wall motion. The left ventricular internal cavity  size was normal in size. There is no left ventricular hypertrophy. Left ventricular diastolic parameters were normal. Right Ventricle: The right ventricular size is normal. No increase in right ventricular wall thickness. Right ventricular systolic function is normal. Left Atrium: Left atrial size was normal in size. Right Atrium: Right atrial size was normal in size. Pericardium: There is no evidence of pericardial effusion. Mitral Valve: The mitral valve is grossly normal. Trivial mitral valve regurgitation. No evidence of mitral valve stenosis. Tricuspid Valve: The tricuspid valve is normal in structure. Tricuspid valve regurgitation is not demonstrated. Aortic Valve: The aortic valve was not well visualized. Aortic valve regurgitation is not visualized. No aortic stenosis is present. Aortic valve mean gradient measures 4.0 mmHg. Aortic valve peak gradient measures 9.6 mmHg. Aortic valve area, by VTI measures 2.13 cm. Pulmonic Valve: The pulmonic valve was not well visualized. Pulmonic valve regurgitation is not visualized. No evidence of pulmonic stenosis. Aorta: The aortic root is  normal in size and structure. Pulmonary Artery: The pulmonary artery is not well seen. Venous: The inferior vena cava is normal in size with greater than 50% respiratory variability, suggesting right atrial pressure of 3 mmHg. IAS/Shunts: The interatrial septum was not well visualized.  LEFT VENTRICLE PLAX 2D LVIDd:         4.57 cm   Diastology LVIDs:         2.72 cm   LV e' medial:    7.83 cm/s LV PW:         0.94 cm   LV E/e' medial:  8.6 LV IVS:        0.96 cm   LV e' lateral:   9.90 cm/s LVOT diam:     1.90 cm   LV E/e' lateral: 6.8 LV SV:         58 LV SV Index:   28 LVOT Area:     2.84 cm  RIGHT VENTRICLE RV Basal diam:  3.20 cm LEFT ATRIUM             Index        RIGHT ATRIUM           Index LA diam:        2.70 cm 1.30 cm/m   RA Area:     10.30 cm LA Vol (A2C):   30.1 ml 14.46 ml/m  RA Volume:   18.40 ml  8.84 ml/m LA  Vol (A4C):   32.5 ml 15.61 ml/m LA Biplane Vol: 31.8 ml 15.28 ml/m  AORTIC VALVE                    PULMONIC VALVE AV Area (Vmax):    1.94 cm     PV Vmax:       0.90 m/s AV Area (Vmean):   2.21 cm     PV Peak grad:  3.2 mmHg AV Area (VTI):     2.13 cm AV Vmax:           155.00 cm/s AV Vmean:          93.400 cm/s AV VTI:            0.274 m AV Peak Grad:      9.6 mmHg AV Mean Grad:      4.0 mmHg LVOT Vmax:         106.00 cm/s LVOT Vmean:        72.900 cm/s LVOT VTI:          0.206 m LVOT/AV VTI ratio: 0.75  AORTA Ao Root diam: 3.50 cm MITRAL VALVE MV Area (PHT): 2.84 cm    SHUNTS MV Decel Time: 267 msec    Systemic VTI:  0.21 m MV E velocity: 67.30 cm/s  Systemic Diam: 1.90 cm MV A velocity: 73.70 cm/s MV E/A ratio:  0.91 Harrell Gave End MD Electronically signed by Nelva Bush MD Signature Date/Time: 08/14/2021/1:51:17 PM    Final    DG Chest 2 View  Result Date: 08/12/2021 CLINICAL DATA:  Sepsis, syncope, short of breath EXAM: CHEST - 2 VIEW COMPARISON:  04/30/2021 FINDINGS: 3 Port in the anterior chest wall with tip in distal SVC. Normal mediastinum and cardiac silhouette. Normal pulmonary vasculature Density over the heart on lateral projection is favored RIGHT middle lobe opacity. No pleural fluid IMPRESSION: Concern for RIGHT middle lobe pneumonia or atelectasis Electronically Signed   By: Suzy Bouchard M.D.   On: 08/12/2021 13:17   CT Angio Chest Pulmonary  Embolism (PE) W or WO Contrast  Result Date: 08/07/2021 CLINICAL DATA:  Chest pain. EXAM: CT ANGIOGRAPHY CHEST WITH CONTRAST TECHNIQUE: Multidetector CT imaging of the chest was performed using the standard protocol during bolus administration of intravenous contrast. Multiplanar CT image reconstructions and MIPs were obtained to evaluate the vascular anatomy. RADIATION DOSE REDUCTION: This exam was performed according to the departmental dose-optimization program which includes automated exposure control, adjustment of the mA and/or kV  according to patient size and/or use of iterative reconstruction technique. CONTRAST:  23mL OMNIPAQUE IOHEXOL 350 MG/ML SOLN COMPARISON:  May 29, 2021. FINDINGS: Cardiovascular: Satisfactory opacification of the pulmonary arteries to the segmental level. No evidence of pulmonary embolism. Normal heart size. No pericardial effusion. Mediastinum/Nodes: No enlarged mediastinal, hilar, or axillary lymph nodes. Thyroid gland, trachea, and esophagus demonstrate no significant findings. Lungs/Pleura: No pneumothorax or pleural effusion is noted. 5.5 x 3.8 cm right infrahilar mass is again noted consistent with malignancy. Mild postobstructive atelectasis is noted. Upper Abdomen: Bilateral adrenal masses are noted concerning for metastatic disease. Musculoskeletal: No chest wall abnormality. No acute or significant osseous findings. Review of the MIP images confirms the above findings. IMPRESSION: No definite evidence of pulmonary embolus. 5.5 x 3.8 cm right infrahilar mass is noted consistent with malignancy with associated postobstructive atelectasis. Bilateral adrenal masses are noted concerning for metastatic disease. Electronically Signed   By: Marijo Conception M.D.   On: 08/07/2021 16:17     Assessment and plan- Patient is a 66 y.o. male With extensive stage small cell lung cancer here for on treatment assessment prior to cycle 2 of carbo etoposide Tecentriq chemotherapy   patient developed neutropenic fever and severe sepsis from Pseudomonas after cycle 1 of chemotherapy.  He had not received Udenyca with that cycle and did not receive Neupogen while he was in the hospital.  His counts returned back to baseline last week when he was supposed to receive his cycle 2 of chemotherapy.  However given that he was still on antibiotics that was delayed by 1 week.  Patient is doing better today.  His counts are back to baseline.  Neutropenia and thrombocytopenia have resolved.  I am therefore proceeding with cycle 2 of  carbo etoposide Tecentriq chemotherapy.  However I am reducing the dose of carboplatin to AUC 4 and I will be adding urine which will be given on day 4 or day 5 of chemotherapy.  He will be seen on a weekly basis with labs for possible fluids by covering NP and I will see him back in 3 weeks for cycle 3  Patient will need repeat CT chest abdomen pelvis with contrast roughly 2 weeks from now  Hypokalemia: He will receive additional IV potassium along with IV fluids today.  Additional IV fluids when he comes to receive the Udenyca  Neoplasm related pain: Continue as needed oxycodone    Visit Diagnosis 1. Small cell lung cancer ([place])   2. Encounter for antineoplastic chemotherapy      Dr. Randa Evens, MD, MPH [facility] at [facility] [phone] 09/01/2021 5:33 PM

## 2021-09-01 NOTE — Progress Notes (Signed)
ON PATHWAY REGIMEN - Small Cell Lung  No Change  Continue With Treatment as Ordered.  Original Decision Date/Time: 07/21/2021 16:26     Cycles 1 through 4, every 21 days:     Atezolizumab      Carboplatin      Etoposide    Cycles 5 and beyond, every 21 days:     Atezolizumab   **Always confirm dose/schedule in your pharmacy ordering system**  Patient Characteristics: Newly Diagnosed, Preoperative or Nonsurgical Candidate (Clinical Staging), First Line, Extensive Stage Therapeutic Status: Newly Diagnosed, Preoperative or Nonsurgical Candidate (Clinical Staging) AJCC T Category: cT2 AJCC N Category: cN0 AJCC M Category: pM1c AJCC 8 Stage Grouping: IVB Stage Classification: Extensive  Intent of Therapy: Non-Curative / Palliative Intent, Discussed with Patient

## 2021-09-01 NOTE — Progress Notes (Signed)
Carbo AUC decreased to 4 (from 5) - confirmed with MD

## 2021-09-01 NOTE — Progress Notes (Signed)
Met with patient during follow up visit with Dr. Janese Banks. All questions answered during visit. Informed that he will be given print out of appts prior to leaving today. Instructed to call with any questions or needs. Pt verbalized understanding.

## 2021-09-01 NOTE — Patient Instructions (Signed)
[facility] CANC[facility] CTR AT [place]  Discharge Instructions: Thank you for choosing [facility] [place] to provide your oncology and hematology care.  If you have a lab appointment with the [facility], please go directly to the [facility] and check in at the registration area.  Wear comfortable clothing and clothing appropriate for easy access to any Portacath or PICC line.   We strive to give you quality time with your provider. You may need to reschedule your appointment if you arrive late (15 or more minutes).  Arriving late affects you and other patients whose appointments are after yours.  Also, if you miss three or more appointments without notifying the office, you may be dismissed from the clinic at the provider's discretion.      For prescription refill requests, have your pharmacy contact our office and allow 72 hours for refills to be completed.    Today you received the following chemotherapy and/or immunotherapy agents Tecentriq, Etoposide, Carboplatin      To help prevent nausea and vomiting after your treatment, we encourage you to take your nausea medication as directed.  BELOW ARE SYMPTOMS THAT SHOULD BE REPORTED IMMEDIATELY: *FEVER GREATER THAN 100.4 F (38 C) OR HIGHER *CHILLS OR SWEATING *NAUSEA AND VOMITING THAT IS NOT CONTROLLED WITH YOUR NAUSEA MEDICATION *UNUSUAL SHORTNESS OF BREATH *UNUSUAL BRUISING OR BLEEDING *URINARY PROBLEMS (pain or burning when urinating, or frequent urination) *BOWEL PROBLEMS (unusual diarrhea, constipation, pain near the anus) TENDERNESS IN MOUTH AND THROAT WITH OR WITHOUT PRESENCE OF ULCERS (sore throat, sores in mouth, or a toothache) UNUSUAL RASH, SWELLING OR PAIN  UNUSUAL VAGINAL DISCHARGE OR ITCHING   Items with * indicate a potential emergency and should be followed up as soon as possible or go to the Emergency Department if any problems should occur.  Please show the CHEMOTHERAPY ALERT CARD or IMMUNOTHERAPY  ALERT CARD at check-in to the Emergency Department and triage nurse.  Should you have questions after your visit or need to cancel or reschedule your appointment, please contact [facility] CANCER [place] AT [place]  [phone] and follow the prompts.  Office hours are 8:00 a.m. to 4:30 p.m. Monday - Friday. Please note that voicemails left after 4:00 p.m. may not be returned until the following business day.  We are closed weekends and major holidays. You have access to a nurse at all times for urgent questions. Please call the main number to the clinic [phone] and follow the prompts.  For any non-urgent questions, you may also contact your provider using MyChart. We now offer e-Visits for anyone 22 and older to request care online for non-urgent symptoms. For details visit [email or web address].   Also download the MyChart app! Go to the app store, search "MyChart", open the app, select [facility], and log in with your MyChart username and password.  Masks are optional in the cancer centers. If you would like for your care team to wear a mask while they are taking care of you, please let them know. For doctor visits, patients may have with them one support person who is at least 66 years old. At this time, visitors are not allowed in the infusion area.

## 2021-09-01 NOTE — Progress Notes (Signed)
Pt will like his ear looked at for the fungus he is currently takintg meds for.

## 2021-09-02 ENCOUNTER — Inpatient Hospital Stay: Payer: Medicare HMO | Admitting: Occupational Therapy

## 2021-09-02 ENCOUNTER — Inpatient Hospital Stay: Payer: Medicare HMO

## 2021-09-02 VITALS — BP 115/67 | HR 72 | Temp 98.3°F | Resp 18

## 2021-09-02 DIAGNOSIS — Z5112 Encounter for antineoplastic immunotherapy: Secondary | ICD-10-CM | POA: Diagnosis not present

## 2021-09-02 DIAGNOSIS — E876 Hypokalemia: Secondary | ICD-10-CM | POA: Diagnosis not present

## 2021-09-02 DIAGNOSIS — C786 Secondary malignant neoplasm of retroperitoneum and peritoneum: Secondary | ICD-10-CM | POA: Diagnosis not present

## 2021-09-02 DIAGNOSIS — C3490 Malignant neoplasm of unspecified part of unspecified bronchus or lung: Secondary | ICD-10-CM

## 2021-09-02 DIAGNOSIS — C7970 Secondary malignant neoplasm of unspecified adrenal gland: Secondary | ICD-10-CM | POA: Diagnosis not present

## 2021-09-02 DIAGNOSIS — D701 Agranulocytosis secondary to cancer chemotherapy: Secondary | ICD-10-CM | POA: Diagnosis not present

## 2021-09-02 DIAGNOSIS — C7889 Secondary malignant neoplasm of other digestive organs: Secondary | ICD-10-CM | POA: Diagnosis not present

## 2021-09-02 DIAGNOSIS — Z5111 Encounter for antineoplastic chemotherapy: Secondary | ICD-10-CM | POA: Diagnosis not present

## 2021-09-02 DIAGNOSIS — C7931 Secondary malignant neoplasm of brain: Secondary | ICD-10-CM | POA: Diagnosis not present

## 2021-09-02 MED ORDER — SODIUM CHLORIDE 0.9 % IV SOLN
220.0000 mg | Freq: Once | INTRAVENOUS | Status: AC
  Administered 2021-09-02: 220 mg via INTRAVENOUS
  Filled 2021-09-02: qty 11

## 2021-09-02 MED ORDER — SODIUM CHLORIDE 0.9 % IV SOLN
Freq: Once | INTRAVENOUS | Status: AC
  Filled 2021-09-02: qty 250

## 2021-09-02 MED ORDER — HEPARIN SOD (PORK) LOCK FLUSH 100 UNIT/ML IV SOLN
500.0000 [IU] | Freq: Once | INTRAVENOUS | Status: AC | PRN
  Administered 2021-09-02: 500 [IU]
  Filled 2021-09-02: qty 5

## 2021-09-02 MED ORDER — SODIUM CHLORIDE 0.9% FLUSH
10.0000 mL | INTRAVENOUS | Status: DC | PRN
  Administered 2021-09-02: 10 mL
  Filled 2021-09-02: qty 10

## 2021-09-02 MED ORDER — SODIUM CHLORIDE 0.9 % IV SOLN
10.0000 mg | Freq: Once | INTRAVENOUS | Status: AC
  Administered 2021-09-02: 10 mg via INTRAVENOUS
  Filled 2021-09-02: qty 1

## 2021-09-02 MED FILL — Dexamethasone Sodium Phosphate Inj 100 MG/10ML: INTRAMUSCULAR | Qty: 1 | Status: AC

## 2021-09-03 ENCOUNTER — Inpatient Hospital Stay: Payer: Medicare HMO

## 2021-09-03 VITALS — BP 122/77 | HR 78 | Temp 98.7°F | Resp 18

## 2021-09-03 DIAGNOSIS — C7970 Secondary malignant neoplasm of unspecified adrenal gland: Secondary | ICD-10-CM | POA: Diagnosis not present

## 2021-09-03 DIAGNOSIS — D701 Agranulocytosis secondary to cancer chemotherapy: Secondary | ICD-10-CM | POA: Diagnosis not present

## 2021-09-03 DIAGNOSIS — E876 Hypokalemia: Secondary | ICD-10-CM | POA: Diagnosis not present

## 2021-09-03 DIAGNOSIS — C7931 Secondary malignant neoplasm of brain: Secondary | ICD-10-CM | POA: Diagnosis not present

## 2021-09-03 DIAGNOSIS — Z5111 Encounter for antineoplastic chemotherapy: Secondary | ICD-10-CM | POA: Diagnosis not present

## 2021-09-03 DIAGNOSIS — C7889 Secondary malignant neoplasm of other digestive organs: Secondary | ICD-10-CM | POA: Diagnosis not present

## 2021-09-03 DIAGNOSIS — Z5112 Encounter for antineoplastic immunotherapy: Secondary | ICD-10-CM | POA: Diagnosis not present

## 2021-09-03 DIAGNOSIS — C3490 Malignant neoplasm of unspecified part of unspecified bronchus or lung: Secondary | ICD-10-CM | POA: Diagnosis not present

## 2021-09-03 DIAGNOSIS — C786 Secondary malignant neoplasm of retroperitoneum and peritoneum: Secondary | ICD-10-CM | POA: Diagnosis not present

## 2021-09-03 MED ORDER — SODIUM CHLORIDE 0.9 % IV SOLN
Freq: Once | INTRAVENOUS | Status: AC
  Filled 2021-09-03: qty 250

## 2021-09-03 MED ORDER — SODIUM CHLORIDE 0.9 % IV SOLN
10.0000 mg | Freq: Once | INTRAVENOUS | Status: AC
  Administered 2021-09-03: 10 mg via INTRAVENOUS
  Filled 2021-09-03: qty 10

## 2021-09-03 MED ORDER — HEPARIN SOD (PORK) LOCK FLUSH 100 UNIT/ML IV SOLN
500.0000 [IU] | Freq: Once | INTRAVENOUS | Status: AC
  Administered 2021-09-03: 500 [IU] via INTRAVENOUS
  Filled 2021-09-03: qty 5

## 2021-09-03 MED ORDER — SODIUM CHLORIDE 0.9 % IV SOLN
220.0000 mg | Freq: Once | INTRAVENOUS | Status: AC
  Administered 2021-09-03: 220 mg via INTRAVENOUS
  Filled 2021-09-03: qty 11

## 2021-09-03 NOTE — Progress Notes (Signed)
Nutrition Follow-up:  Patient with small cell lung cancer with brain, omentum, pancreatic, and adrenal metastases.  Completed whole brain radiation on 7/21.  Patient on carboplatin, etoposide, tecentriq  Met with patient and wife during infusion.  Patient reports eating better.  Ate 2 sausage biscuits this am and liver pudding sandwich for lunch (1/2).  Last night for dinner ate 2 chicken thighs and butter beans and rice.  Tried oral nutrition supplement shakes and able to drink them.     Medications: reviewed  Labs: reviewed  Anthropometrics:   Weight 200 lb 4.8 oz 8/22  211 lb on 7/24 212 lb on 7/12  UBW of 224-227 lb    NUTRITION DIAGNOSIS: Inadequate oral intake ongoing   INTERVENTION:  Encouraged 350 + calorie shake or higher Pedialyte packets given to patient to try Continue appetite stimulant    MONITORING, EVALUATION, GOAL: weight trends, intake   NEXT VISIT: Thursday, Sept 14 during infusion  Shauntel Prest B. [name], [place], [place] Registered Dietitian [phone]

## 2021-09-04 ENCOUNTER — Inpatient Hospital Stay (HOSPITAL_BASED_OUTPATIENT_CLINIC_OR_DEPARTMENT_OTHER): Payer: Medicare HMO | Admitting: Hospice and Palliative Medicine

## 2021-09-04 ENCOUNTER — Inpatient Hospital Stay: Payer: Medicare HMO | Admitting: Hospice and Palliative Medicine

## 2021-09-04 ENCOUNTER — Ambulatory Visit: Payer: Medicare HMO

## 2021-09-04 ENCOUNTER — Encounter: Payer: Medicare HMO | Admitting: Hospice and Palliative Medicine

## 2021-09-04 ENCOUNTER — Inpatient Hospital Stay: Payer: Medicare HMO

## 2021-09-04 VITALS — BP 123/82 | HR 85 | Temp 97.6°F | Resp 20

## 2021-09-04 DIAGNOSIS — C786 Secondary malignant neoplasm of retroperitoneum and peritoneum: Secondary | ICD-10-CM | POA: Diagnosis not present

## 2021-09-04 DIAGNOSIS — C3490 Malignant neoplasm of unspecified part of unspecified bronchus or lung: Secondary | ICD-10-CM | POA: Diagnosis not present

## 2021-09-04 DIAGNOSIS — C7889 Secondary malignant neoplasm of other digestive organs: Secondary | ICD-10-CM | POA: Diagnosis not present

## 2021-09-04 DIAGNOSIS — Z5112 Encounter for antineoplastic immunotherapy: Secondary | ICD-10-CM | POA: Diagnosis not present

## 2021-09-04 DIAGNOSIS — Z515 Encounter for palliative care: Secondary | ICD-10-CM | POA: Diagnosis not present

## 2021-09-04 DIAGNOSIS — Z5111 Encounter for antineoplastic chemotherapy: Secondary | ICD-10-CM | POA: Diagnosis not present

## 2021-09-04 DIAGNOSIS — C7970 Secondary malignant neoplasm of unspecified adrenal gland: Secondary | ICD-10-CM | POA: Diagnosis not present

## 2021-09-04 DIAGNOSIS — C7931 Secondary malignant neoplasm of brain: Secondary | ICD-10-CM | POA: Diagnosis not present

## 2021-09-04 DIAGNOSIS — E876 Hypokalemia: Secondary | ICD-10-CM | POA: Diagnosis not present

## 2021-09-04 DIAGNOSIS — D701 Agranulocytosis secondary to cancer chemotherapy: Secondary | ICD-10-CM | POA: Diagnosis not present

## 2021-09-04 MED ORDER — PEGFILGRASTIM-CBQV 6 MG/0.6ML ~~LOC~~ SOSY
6.0000 mg | PREFILLED_SYRINGE | Freq: Once | SUBCUTANEOUS | Status: AC
  Administered 2021-09-04: 6 mg via SUBCUTANEOUS
  Filled 2021-09-04: qty 0.6

## 2021-09-04 MED ORDER — POTASSIUM CHLORIDE IN NACL 20-0.9 MEQ/L-% IV SOLN
Freq: Once | INTRAVENOUS | Status: AC
  Filled 2021-09-04: qty 1000

## 2021-09-04 NOTE — Patient Instructions (Signed)
[facility] CANCER CTR AT [place]  Discharge Instructions: Thank you for choosing [facility] to provide your oncology and hematology care.  If you have a lab appointment with the [facility], please go directly to the [facility] and check in at the registration area.  Wear comfortable clothing and clothing appropriate for easy access to any Portacath or PICC line.   We strive to give you quality time with your provider. You may need to reschedule your appointment if you arrive late (15 or more minutes).  Arriving late affects you and other patients whose appointments are after yours.  Also, if you miss three or more appointments without notifying the office, you may be dismissed from the clinic at the provider's discretion.      For prescription refill requests, have your pharmacy contact our office and allow 72 hours for refills to be completed.    Today you received the following chemotherapy and/or immunotherapy agents IVF and udenyca      To help prevent nausea and vomiting after your treatment, we encourage you to take your nausea medication as directed.  BELOW ARE SYMPTOMS THAT SHOULD BE REPORTED IMMEDIATELY: *FEVER GREATER THAN 100.4 F (38 C) OR HIGHER *CHILLS OR SWEATING *NAUSEA AND VOMITING THAT IS NOT CONTROLLED WITH YOUR NAUSEA MEDICATION *UNUSUAL SHORTNESS OF BREATH *UNUSUAL BRUISING OR BLEEDING *URINARY PROBLEMS (pain or burning when urinating, or frequent urination) *BOWEL PROBLEMS (unusual diarrhea, constipation, pain near the anus) TENDERNESS IN MOUTH AND THROAT WITH OR WITHOUT PRESENCE OF ULCERS (sore throat, sores in mouth, or a toothache) UNUSUAL RASH, SWELLING OR PAIN  UNUSUAL VAGINAL DISCHARGE OR ITCHING   Items with * indicate a potential emergency and should be followed up as soon as possible or go to the Emergency Department if any problems should occur.  Please show the CHEMOTHERAPY ALERT CARD or IMMUNOTHERAPY ALERT CARD at  check-in to the Emergency Department and triage nurse.  Should you have questions after your visit or need to cancel or reschedule your appointment, please contact [facility]CER [place] AT [place]  [phone] and follow the prompts.  Office hours are 8:00 a.m. to 4:30 p.m. Monday - Friday. Please note that voicemails left after 4:00 p.m. may not be returned until the following business day.  We are closed weekends and major holidays. You have access to a nurse at all times for urgent questions. Please call the main number to the clinic [phone] and follow the prompts.  For any non-urgent questions, you may also contact your provider using MyChart. We now offer e-Visits for anyone 59 and older to request care online for non-urgent symptoms. For details visit [email or web address].   Also download the MyChart app! Go to the app store, search "MyChart", open the app, select [facility], and log in with your MyChart username and password.  Masks are optional in the cancer centers. If you would like for your care team to wear a mask while they are taking care of you, please let them know. For doctor visits, patients may have with them one support person who is at least 66 years old. At this time, visitors are not allowed in the infusion area.  Pegfilgrastim Injection What is this medication? PEGFILGRASTIM (PEG fil gra stim) lowers the risk of infection in people who are receiving chemotherapy. It works by Building control surveyor make more white blood cells, which protects your body from infection. It may also be used to help people who have been exposed to high doses  of radiation. This medicine may be used for other purposes; ask your health care provider or pharmacist if you have questions. COMMON BRAND NAME(S): [employer], Neulasta, Nyvepria, Stimufend, UDENYCA, Ziextenzo What should I tell my care team before I take this medication? They need to know if you have any of these  conditions: Kidney disease Latex allergy Ongoing radiation therapy Sickle cell disease Skin reactions to acrylic adhesives (On-Body Injector only) An unusual or allergic reaction to pegfilgrastim, filgrastim, other medications, foods, dyes, or preservatives Pregnant or trying to get pregnant Breast-feeding How should I use this medication? This medication is for injection under the skin. If you get this medication at home, you will be taught how to prepare and give the pre-filled syringe or how to use the On-body Injector. Refer to the patient Instructions for Use for detailed instructions. Use exactly as directed. Tell your care team immediately if you suspect that the On-body Injector may not have performed as intended or if you suspect the use of the On-body Injector resulted in a missed or partial dose. It is important that you put your used needles and syringes in a special sharps container. Do not put them in a trash can. If you do not have a sharps container, call your pharmacist or care team to get one. Talk to your care team about the use of this medication in children. While this medication may be prescribed for selected conditions, precautions do apply. Overdosage: If you think you have taken too much of this medicine contact a poison control center or emergency room at once. NOTE: This medicine is only for you. Do not share this medicine with others. What if I miss a dose? It is important not to miss your dose. Call your care team if you miss your dose. If you miss a dose due to an On-body Injector failure or leakage, a new dose should be administered as soon as possible using a single prefilled syringe for manual use. What may interact with this medication? Interactions have not been studied. This list may not describe all possible interactions. Give your health care provider a list of all the medicines, herbs, non-prescription drugs, or dietary supplements you use. Also tell them if  you smoke, drink alcohol, or use illegal drugs. Some items may interact with your medicine. What should I watch for while using this medication? Your condition will be monitored carefully while you are receiving this medication. You may need blood work done while you are taking this medication. Talk to your care team about your risk of cancer. You may be more at risk for certain types of cancer if you take this medication. If you are going to need a MRI, CT scan, or other procedure, tell your care team that you are using this medication (On-Body Injector only). What side effects may I notice from receiving this medication? Side effects that you should report to your care team as soon as possible: Allergic reactions--skin rash, itching, hives, swelling of the face, lips, tongue, or throat Capillary leak syndrome--stomach or muscle pain, unusual weakness or fatigue, feeling faint or lightheaded, decrease in the amount of urine, swelling of the ankles, hands, or feet, trouble breathing High white blood cell level--fever, fatigue, trouble breathing, night sweats, change in vision, weight loss Inflammation of the aorta--fever, fatigue, back, chest, or stomach pain, severe headache Kidney injury (glomerulonephritis)--decrease in the amount of urine, red or dark brown urine, foamy or bubbly urine, swelling of the ankles, hands, or feet Shortness  of breath or trouble breathing Spleen injury--pain in upper left stomach or shoulder Unusual bruising or bleeding Side effects that usually do not require medical attention (report to your care team if they continue or are bothersome): Bone pain Pain in the hands or feet This list may not describe all possible side effects. Call your doctor for medical advice about side effects. You may report side effects to FDA at 1-800-FDA-1088. Where should I keep my medication? Keep out of the reach of children. If you are using this medication at home, you will be  instructed on how to store it. Throw away any unused medication after the expiration date on the label. NOTE: This sheet is a summary. It may not cover all possible information. If you have questions about this medicine, talk to your doctor, pharmacist, or health care provider.  2023 Elsevier/Gold Standard (2013-03-30 00:00:00)

## 2021-09-04 NOTE — Progress Notes (Signed)
[facility] at [facility] Telephone:(336) [phone] Fax:(336) [phone]   Name: Maurice Simpson Date: 09/04/2021 MRN: [id number]  DOB: 01-Jan-1956  Patient Care Team: [name], NP as PCP - General (Nurse Practitioner) Telford Nab, RN as Oncology Nurse Navigator    REASON FOR CONSULTATION: Maurice Simpson is a 66 y.o. 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  Patient was hospitalized 08/12/2021 to 08/15/2021 with neutropenic fever and found to have postobstructive pneumonia and Pseudomonas bacteremia. .   SOCIAL HISTORY:     reports that he has been smoking cigarettes. He has a 102.00 pack-year smoking history. He has never used smokeless tobacco. He reports that he does not currently use alcohol. He reports that he does not use drugs.  Patient is married and lives at home with his wife.  He has 2 daughters and a son, all of whom live in [place].  Patient owns and operates a tree service.  ADVANCE DIRECTIVES:  Not on file  CODE STATUS: Full code  PAST MEDICAL HISTORY: Past Medical History:  Diagnosis Date   Cancer ([place])    COPD (chronic obstructive pulmonary disease) ([place])    Dyspnea     PAST SURGICAL HISTORY:  Past Surgical History:  Procedure Laterality Date   CATARACT EXTRACTION Right 03/10/2021   IR IMAGING GUIDED PORT INSERTION  07/27/2021    HEMATOLOGY/ONCOLOGY HISTORY:  Oncology History  Small cell lung cancer ([place])  07/21/2021 Initial Diagnosis   Small cell lung cancer ([place])   07/21/2021 Cancer Staging   Staging form: Lung, AJCC 8th Edition - Clinical stage from 07/21/2021: Stage IVB (cT2, cN0, pM1c) - Signed by Sindy Guadeloupe, MD on 07/21/2021   08/03/2021 - 09/01/2021 Chemotherapy   Patient is on Treatment Plan : LUNG SCLC Carboplatin + Etoposide +  Atezolizumab Induction q21d / Atezolizumab Maintenance q21d     09/01/2021 -  Chemotherapy   Patient is on Treatment Plan : LUNG SCLC Carboplatin + Etoposide + Atezolizumab Induction q21d x 4 cycles / Atezolizumab Maintenance q21d       ALLERGIES:  is allergic to bee venom.  MEDICATIONS:  Current Outpatient Medications  Medication Sig Dispense Refill   albuterol (PROVENTIL) (2.5 MG/3ML) 0.083% nebulizer solution Take 3 mLs (2.5 mg total) by nebulization every 6 (six) hours as needed for wheezing or shortness of breath. 75 mL 12   budesonide (PULMICORT) 0.25 MG/2ML nebulizer solution Take 2 mLs (0.25 mg total) by nebulization 2 (two) times daily as needed. 60 mL 12   ciprofloxacin (CIPRO) 750 MG tablet Take 1 tablet (750 mg total) by mouth 2 (two) times daily for 21 days. 42 tablet 0   clotrimazole (LOTRIMIN) 1 % external solution SMARTSIG:3-4 Drop(s) In Ear(s) Twice Daily     dexamethasone (DECADRON) 4 MG tablet Take 1 tablet by mouth twice a day for 7 days, then take 1 tablet by mouth daily for 7 days, then take 0.5 tablet by mouth daily for 7 days, then stop (Patient not taking: Reported on 09/01/2021) 30 tablet 0   dronabinol (MARINOL) 5 MG capsule Take 1 capsule (5 mg total) by mouth 2 (two) times daily before lunch and supper. (Patient not taking: Reported on 09/01/2021) 30 capsule 0   NEOMYCIN-POLYMYXIN-HYDROCORTISONE (CORTISPORIN) 1 % SOLN OTIC solution SMARTSIG:3-4 Drop(s) In Ear(s) Twice Daily     nystatin (MYCOSTATIN)  100000 UNIT/ML suspension Take 5 mLs (500,000 Units total) by mouth 3 (three) times daily. 180 mL 0   ondansetron (ZOFRAN) 4 MG tablet Take 1 tablet (4 mg total) by mouth every 8 (eight) hours as needed for nausea or vomiting. (Patient not taking: Reported on 09/01/2021) 60 tablet 3   oxyCODONE (OXY IR/ROXICODONE) 5 MG immediate release tablet Take 1 tablet (5 mg total) by mouth every 4 (four) hours as needed for severe pain. 60 tablet 0   pantoprazole (PROTONIX) 20 MG  tablet TAKE 1 TABLET BY MOUTH EVERY DAY 90 tablet 0   potassium chloride SA (KLOR-CON M) 20 MEQ tablet Take 1 tablet (20 mEq total) by mouth 2 (two) times daily for 2 days. (Patient not taking: Reported on 09/01/2021) 4 tablet 0   prochlorperazine (COMPAZINE) 10 MG tablet Take 1 tablet (10 mg total) by mouth every 6 (six) hours as needed for nausea or vomiting. 60 tablet 2   traZODone (DESYREL) 50 MG tablet TAKE 1 TABLET BY MOUTH AT BEDTIME AS NEEDED FOR SLEEP. (Patient not taking: Reported on 09/01/2021) 90 tablet 1   valACYclovir (VALTREX) 1000 MG tablet Take 1 tablet (1,000 mg total) by mouth daily. 10 tablet 10   No current facility-administered medications for this visit.    VITAL SIGNS: There were no vitals taken for this visit. There were no vitals filed for this visit.  Estimated body mass index is 30.46 kg/m as calculated from the following:   Height as of 08/25/21: 5\' 8"  (1.727 m).   Weight as of 09/01/21: 200 lb 4.8 oz (90.9 kg).  LABS: CBC:    Component Value Date/Time   WBC 11.4 (H) 09/01/2021 0920   HGB 14.5 09/01/2021 0920   HGB 18.0 (H) 04/30/2021 1136   HCT 41.6 09/01/2021 0920   HCT 53.3 (H) 04/30/2021 1136   PLT 401 (H) 09/01/2021 0920   PLT 156 04/30/2021 1136   MCV 90.8 09/01/2021 0920   MCV 91 04/30/2021 1136   MCV 94 01/21/2011 0134   NEUTROABS 7.0 09/01/2021 0920   NEUTROABS 4.8 04/30/2021 1136   LYMPHSABS 2.6 09/01/2021 0920   LYMPHSABS 2.9 04/30/2021 1136   MONOABS 1.1 (H) 09/01/2021 0920   EOSABS 0.3 09/01/2021 0920   EOSABS 0.3 04/30/2021 1136   BASOSABS 0.2 (H) 09/01/2021 0920   BASOSABS 0.1 04/30/2021 1136   Comprehensive Metabolic Panel:    Component Value Date/Time   NA 138 09/01/2021 0920   NA 136 04/30/2021 1136   NA 139 01/21/2011 0134   K 3.0 (L) 09/01/2021 0920   K 3.8 01/21/2011 0134   CL 104 09/01/2021 0920   CL 105 01/21/2011 0134   CO2 26 09/01/2021 0920   CO2 23 01/21/2011 0134   BUN 10 09/01/2021 0920   BUN 12 04/30/2021  1136   BUN 15 01/21/2011 0134   CREATININE 0.94 09/01/2021 0920   CREATININE 0.92 01/21/2011 0134   GLUCOSE 127 (H) 09/01/2021 0920   GLUCOSE 118 (H) 01/21/2011 0134   CALCIUM 8.9 09/01/2021 0920   CALCIUM 9.1 01/21/2011 0134   AST 22 09/01/2021 0920   AST 20 01/21/2011 0134   ALT 18 09/01/2021 0920   ALT 29 01/21/2011 0134   ALKPHOS 63 09/01/2021 0920   ALKPHOS 46 (L) 01/21/2011 0134   BILITOT 0.8 09/01/2021 0920   BILITOT 0.8 04/30/2021 1136   BILITOT 0.8 01/21/2011 0134   PROT 7.6 09/01/2021 0920   PROT 7.2 04/30/2021 1136   PROT 7.4 01/21/2011 0134  ALBUMIN 3.2 (L) 09/01/2021 0920   ALBUMIN 4.3 04/30/2021 1136   ALBUMIN 3.8 01/21/2011 0134    RADIOGRAPHIC STUDIES: ECHOCARDIOGRAM COMPLETE  Result Date: 08/14/2021    ECHOCARDIOGRAM REPORT   Patient Name:   Nadia ELDO UMANZOR Date of Exam: 08/14/2021 Medical Rec #:  [id number]    Height:       68.0 in Accession #:    [id number]   Weight:       208.8 lb Date of Birth:  [birth year]    BSA:          2.081 m Patient Age:    58 years     BP:           110/63 mmHg Patient Gender: M            HR:           80 bpm. Exam Location:  [facility] Procedure: 2D Echo, Color Doppler and Cardiac Doppler Indications:     R78.81 Bacteremia  History:         Patient has prior history of Echocardiogram examinations, most                  recent 06/17/2021. COPD, Signs/Symptoms:Fever; Risk                  Factors:Current Smoker.  Sonographer:     Charmayne Sheer Referring Phys:  [id number] Tsosie Billing Diagnosing Phys: Nelva Bush MD  Sonographer Comments: Technically difficult study due to poor echo windows. Image acquisition challenging due to patient body habitus and Image acquisition challenging due to COPD. IMPRESSIONS  1. Left ventricular ejection fraction, by estimation, is 60 to 65%. The left ventricle has normal function. Left ventricular endocardial border not optimally defined to evaluate regional wall motion. Left ventricular diastolic parameters were  normal.  2. Right ventricular systolic function is normal. The right ventricular size is normal.  3. The mitral valve is grossly normal. Trivial mitral valve regurgitation. No evidence of mitral stenosis.  4. The aortic valve was not well visualized. Aortic valve regurgitation is not visualized. No aortic stenosis is present.  5. The inferior vena cava is normal in size with greater than 50% respiratory variability, suggesting right atrial pressure of 3 mmHg. FINDINGS  Left Ventricle: Left ventricular ejection fraction, by estimation, is 60 to 65%. The left ventricle has normal function. Left ventricular endocardial border not optimally defined to evaluate regional wall motion. The left ventricular internal cavity size was normal in size. There is no left ventricular hypertrophy. Left ventricular diastolic parameters were normal. Right Ventricle: The right ventricular size is normal. No increase in right ventricular wall thickness. Right ventricular systolic function is normal. Left Atrium: Left atrial size was normal in size. Right Atrium: Right atrial size was normal in size. Pericardium: There is no evidence of pericardial effusion. Mitral Valve: The mitral valve is grossly normal. Trivial mitral valve regurgitation. No evidence of mitral valve stenosis. Tricuspid Valve: The tricuspid valve is normal in structure. Tricuspid valve regurgitation is not demonstrated. Aortic Valve: The aortic valve was not well visualized. Aortic valve regurgitation is not visualized. No aortic stenosis is present. Aortic valve mean gradient measures 4.0 mmHg. Aortic valve peak gradient measures 9.6 mmHg. Aortic valve area, by VTI measures 2.13 cm. Pulmonic Valve: The pulmonic valve was not well visualized. Pulmonic valve regurgitation is not visualized. No evidence of pulmonic stenosis. Aorta: The aortic root is normal in size and structure. Pulmonary Artery: The pulmonary artery is not well  seen. Venous: The inferior vena cava is  normal in size with greater than 50% respiratory variability, suggesting right atrial pressure of 3 mmHg. IAS/Shunts: The interatrial septum was not well visualized.  LEFT VENTRICLE PLAX 2D LVIDd:         4.57 cm   Diastology LVIDs:         2.72 cm   LV e' medial:    7.83 cm/s LV PW:         0.94 cm   LV E/e' medial:  8.6 LV IVS:        0.96 cm   LV e' lateral:   9.90 cm/s LVOT diam:     1.90 cm   LV E/e' lateral: 6.8 LV SV:         58 LV SV Index:   28 LVOT Area:     2.84 cm  RIGHT VENTRICLE RV Basal diam:  3.20 cm LEFT ATRIUM             Index        RIGHT ATRIUM           Index LA diam:        2.70 cm 1.30 cm/m   RA Area:     10.30 cm LA Vol (A2C):   30.1 ml 14.46 ml/m  RA Volume:   18.40 ml  8.84 ml/m LA Vol (A4C):   32.5 ml 15.61 ml/m LA Biplane Vol: 31.8 ml 15.28 ml/m  AORTIC VALVE                    PULMONIC VALVE AV Area (Vmax):    1.94 cm     PV Vmax:       0.90 m/s AV Area (Vmean):   2.21 cm     PV Peak grad:  3.2 mmHg AV Area (VTI):     2.13 cm AV Vmax:           155.00 cm/s AV Vmean:          93.400 cm/s AV VTI:            0.274 m AV Peak Grad:      9.6 mmHg AV Mean Grad:      4.0 mmHg LVOT Vmax:         106.00 cm/s LVOT Vmean:        72.900 cm/s LVOT VTI:          0.206 m LVOT/AV VTI ratio: 0.75  AORTA Ao Root diam: 3.50 cm MITRAL VALVE MV Area (PHT): 2.84 cm    SHUNTS MV Decel Time: 267 msec    Systemic VTI:  0.21 m MV E velocity: 67.30 cm/s  Systemic Diam: 1.90 cm MV A velocity: 73.70 cm/s MV E/A ratio:  0.91 Harrell Gave End MD Electronically signed by Nelva Bush MD Signature Date/Time: 08/14/2021/1:51:17 PM    Final    DG Chest 2 View  Result Date: 08/12/2021 CLINICAL DATA:  Sepsis, syncope, short of breath EXAM: CHEST - 2 VIEW COMPARISON:  04/30/2021 FINDINGS: 3 Port in the anterior chest wall with tip in distal SVC. Normal mediastinum and cardiac silhouette. Normal pulmonary vasculature Density over the heart on lateral projection is favored RIGHT middle lobe opacity. No pleural  fluid IMPRESSION: Concern for RIGHT middle lobe pneumonia or atelectasis Electronically Signed   By: Suzy Bouchard M.D.   On: 08/12/2021 13:17   CT Angio Chest Pulmonary Embolism (PE) W or WO Contrast  Result Date: 08/07/2021 CLINICAL DATA:  Chest pain. EXAM: CT ANGIOGRAPHY CHEST WITH CONTRAST TECHNIQUE: Multidetector CT imaging of the chest was performed using the standard protocol during bolus administration of intravenous contrast. Multiplanar CT image reconstructions and MIPs were obtained to evaluate the vascular anatomy. RADIATION DOSE REDUCTION: This exam was performed according to the departmental dose-optimization program which includes automated exposure control, adjustment of the mA and/or kV according to patient size and/or use of iterative reconstruction technique. CONTRAST:  30mL OMNIPAQUE IOHEXOL 350 MG/ML SOLN COMPARISON:  May 29, 2021. FINDINGS: Cardiovascular: Satisfactory opacification of the pulmonary arteries to the segmental level. No evidence of pulmonary embolism. Normal heart size. No pericardial effusion. Mediastinum/Nodes: No enlarged mediastinal, hilar, or axillary lymph nodes. Thyroid gland, trachea, and esophagus demonstrate no significant findings. Lungs/Pleura: No pneumothorax or pleural effusion is noted. 5.5 x 3.8 cm right infrahilar mass is again noted consistent with malignancy. Mild postobstructive atelectasis is noted. Upper Abdomen: Bilateral adrenal masses are noted concerning for metastatic disease. Musculoskeletal: No chest wall abnormality. No acute or significant osseous findings. Review of the MIP images confirms the above findings. IMPRESSION: No definite evidence of pulmonary embolus. 5.5 x 3.8 cm right infrahilar mass is noted consistent with malignancy with associated postobstructive atelectasis. Bilateral adrenal masses are noted concerning for metastatic disease. Electronically Signed   By: Marijo Conception M.D.   On: 08/07/2021 16:17    PERFORMANCE STATUS  (ECOG) : 2 - Symptomatic, <50% confined to bed  Review of Systems Unless otherwise noted, a complete review of systems is negative.  Physical Exam General: NAD Pulmonary: Unlabored Abdomen: soft, nontender, + bowel sounds GU: no suprapubic tenderness Extremities: no edema, no joint deformities Skin: no rashes Neurological: Weakness but otherwise nonfocal  IMPRESSION: Routine follow-up.  Patient seen in infusion.  Is accompanied by his wife.  Patient reports he is doing reasonably well.  He denies significant changes or concerns.  He reports improved appetite and plans to eat a steak tonight for dinner.  Pain is reasonably well controlled on as needed oxycodone.  No adverse effects reported from medications or treatments.  Patient reports stable performance status.  He is still working.  Denies recent falls.  PLAN: -Continue current scope of treatment -Oxycodone as needed for pain -Daily bowel regimen -Follow-up telephone visit 1 month   Patient expressed understanding and was in agreement with this plan. He also understands that He can call the clinic at any time with any questions, concerns, or complaints.     Time Total: 15 minutes  Visit consisted of counseling and education dealing with the complex and emotionally intense issues of symptom management and palliative care in the setting of serious and potentially life-threatening illness.Greater than 50%  of this time was spent counseling and coordinating care related to the above assessment and plan.  Signed by: Altha Harm, PhD, NP-C

## 2021-09-07 ENCOUNTER — Telehealth: Payer: Self-pay | Admitting: *Deleted

## 2021-09-07 NOTE — Telephone Encounter (Signed)
noted 

## 2021-09-07 NOTE — Telephone Encounter (Signed)
Wife called reporting that she is concerned due to patient not doing well. She reports that he came in Friday for IV fluids and that he slept most of weekend and was lethargic. His O2 sat is 92 %. I asked if he is better today and she reports that he said he is doing good, he is sitting in the office right now, He is not having shortness of breath he is awake and alert, He is eating and drinking well. I told her that his O2 sat at 92 is good and that I do not hear anything that sets off alarms I explained to her that his nausea medications may be causing some drowsiness (she said he is taking nausea medicine) I told her that if anything changes to call us back an we can see him. He has an appointment Wednesday for possible IV fluids.

## 2021-09-09 ENCOUNTER — Inpatient Hospital Stay: Payer: Medicare HMO

## 2021-09-09 ENCOUNTER — Inpatient Hospital Stay (HOSPITAL_BASED_OUTPATIENT_CLINIC_OR_DEPARTMENT_OTHER): Payer: Medicare HMO | Admitting: Nurse Practitioner

## 2021-09-09 ENCOUNTER — Ambulatory Visit: Payer: Medicare HMO

## 2021-09-09 ENCOUNTER — Inpatient Hospital Stay: Payer: Medicare HMO | Admitting: Nurse Practitioner

## 2021-09-09 ENCOUNTER — Other Ambulatory Visit: Payer: Medicare HMO

## 2021-09-09 ENCOUNTER — Ambulatory Visit: Payer: Medicare HMO | Admitting: Occupational Therapy

## 2021-09-09 ENCOUNTER — Inpatient Hospital Stay: Payer: Medicare HMO | Admitting: Occupational Therapy

## 2021-09-09 ENCOUNTER — Ambulatory Visit: Payer: Medicare HMO | Admitting: Nurse Practitioner

## 2021-09-09 VITALS — BP 115/82 | HR 82 | Temp 97.5°F | Resp 18

## 2021-09-09 DIAGNOSIS — C7931 Secondary malignant neoplasm of brain: Secondary | ICD-10-CM | POA: Diagnosis not present

## 2021-09-09 DIAGNOSIS — Z5189 Encounter for other specified aftercare: Secondary | ICD-10-CM

## 2021-09-09 DIAGNOSIS — Z5112 Encounter for antineoplastic immunotherapy: Secondary | ICD-10-CM | POA: Diagnosis not present

## 2021-09-09 DIAGNOSIS — C3490 Malignant neoplasm of unspecified part of unspecified bronchus or lung: Secondary | ICD-10-CM | POA: Diagnosis not present

## 2021-09-09 DIAGNOSIS — R110 Nausea: Secondary | ICD-10-CM | POA: Diagnosis not present

## 2021-09-09 DIAGNOSIS — Z5111 Encounter for antineoplastic chemotherapy: Secondary | ICD-10-CM | POA: Diagnosis not present

## 2021-09-09 DIAGNOSIS — C786 Secondary malignant neoplasm of retroperitoneum and peritoneum: Secondary | ICD-10-CM | POA: Diagnosis not present

## 2021-09-09 DIAGNOSIS — E876 Hypokalemia: Secondary | ICD-10-CM | POA: Diagnosis not present

## 2021-09-09 DIAGNOSIS — R5383 Other fatigue: Secondary | ICD-10-CM

## 2021-09-09 DIAGNOSIS — Z95828 Presence of other vascular implants and grafts: Secondary | ICD-10-CM

## 2021-09-09 DIAGNOSIS — T451X5A Adverse effect of antineoplastic and immunosuppressive drugs, initial encounter: Secondary | ICD-10-CM

## 2021-09-09 DIAGNOSIS — C7889 Secondary malignant neoplasm of other digestive organs: Secondary | ICD-10-CM | POA: Diagnosis not present

## 2021-09-09 DIAGNOSIS — C7970 Secondary malignant neoplasm of unspecified adrenal gland: Secondary | ICD-10-CM | POA: Diagnosis not present

## 2021-09-09 DIAGNOSIS — D701 Agranulocytosis secondary to cancer chemotherapy: Secondary | ICD-10-CM | POA: Diagnosis not present

## 2021-09-09 LAB — BASIC METABOLIC PANEL
Anion gap: 7 (ref 5–15)
BUN: 18 mg/dL (ref 8–23)
CO2: 26 mmol/L (ref 22–32)
Calcium: 9.2 mg/dL (ref 8.9–10.3)
Chloride: 101 mmol/L (ref 98–111)
Creatinine, Ser: 0.76 mg/dL (ref 0.61–1.24)
GFR, Estimated: >60 mL/min (ref >60)
Glucose, Bld: 102 mg/dL — ABNORMAL HIGH (ref 70–99)
Potassium: 3.5 mmol/L (ref 3.5–5.1)
Sodium: 134 mmol/L — ABNORMAL LOW (ref 135–145)

## 2021-09-09 LAB — CBC WITH DIFFERENTIAL/PLATELET
Abs Immature Granulocytes: 0.06 10*3/uL (ref 0.00–0.07)
Basophils Absolute: 0.1 10*3/uL (ref 0.0–0.1)
Basophils Relative: 3 %
Eosinophils Absolute: 0.2 10*3/uL (ref 0.0–0.5)
Eosinophils Relative: 4 %
HCT: 36 % — ABNORMAL LOW (ref 39.0–52.0)
Hemoglobin: 12.7 g/dL — ABNORMAL LOW (ref 13.0–17.0)
Immature Granulocytes: 1 %
Lymphocytes Relative: 41 %
Lymphs Abs: 1.7 10*3/uL (ref 0.7–4.0)
MCH: 31.7 pg (ref 26.0–34.0)
MCHC: 35.3 g/dL (ref 30.0–36.0)
MCV: 89.8 fL (ref 80.0–100.0)
Monocytes Absolute: 0.1 10*3/uL (ref 0.1–1.0)
Monocytes Relative: 3 %
Neutro Abs: 2 10*3/uL (ref 1.7–7.7)
Neutrophils Relative %: 48 %
Platelets: 131 10*3/uL — ABNORMAL LOW (ref 150–400)
RBC: 4.01 MIL/uL — ABNORMAL LOW (ref 4.22–5.81)
RDW: 13.2 % (ref 11.5–15.5)
Smear Review: NORMAL
WBC: 4.2 10*3/uL (ref 4.0–10.5)
nRBC: 0 % (ref 0.0–0.2)

## 2021-09-09 MED ORDER — SODIUM CHLORIDE 0.9 % IV SOLN: 8.0000 mg | Freq: Once | INTRAVENOUS | Status: DC

## 2021-09-09 MED ORDER — HEPARIN SOD (PORK) LOCK FLUSH 100 UNIT/ML IV SOLN
500.0000 [IU] | Freq: Once | INTRAVENOUS | Status: AC
  Administered 2021-09-09: 500 [IU] via INTRAVENOUS
  Filled 2021-09-09: qty 5

## 2021-09-09 MED ORDER — SODIUM CHLORIDE 0.9% FLUSH
10.0000 mL | Freq: Once | INTRAVENOUS | Status: AC
  Administered 2021-09-09: 10 mL via INTRAVENOUS
  Filled 2021-09-09: qty 10

## 2021-09-09 MED ORDER — SODIUM CHLORIDE 0.9 % IV SOLN
INTRAVENOUS | Status: AC
  Filled 2021-09-09 (×2): qty 250

## 2021-09-09 MED ORDER — POTASSIUM CHLORIDE CRYS ER 20 MEQ PO TBCR: 20.0000 meq | EXTENDED_RELEASE_TABLET | Freq: Every day | ORAL | 0 refills | Status: DC

## 2021-09-09 MED ORDER — PROCHLORPERAZINE EDISYLATE 10 MG/2ML IJ SOLN
10.0000 mg | Freq: Once | INTRAMUSCULAR | Status: AC
  Administered 2021-09-09: 10 mg via INTRAVENOUS
  Filled 2021-09-09: qty 2

## 2021-09-09 MED ORDER — ONDANSETRON HCL 4 MG/2ML IJ SOLN
8.0000 mg | Freq: Once | INTRAMUSCULAR | Status: AC
  Administered 2021-09-09: 8 mg via INTRAVENOUS
  Filled 2021-09-09: qty 4

## 2021-09-09 NOTE — Therapy (Signed)
[facility] at [facility], [place], [place], [address] Phone: [phone]   Fax:  [phone]  Occupational Therapy Screen:  Patient Details  Name: Maurice Simpson MRN: [id number] Date of Birth: 10-21-1955 No data recorded  Encounter Date: 09/09/2021   OT End of Session - 09/09/21 1026     Visit Number 0             Past Medical History:  Diagnosis Date   Cancer [facility])    COPD (chronic obstructive pulmonary disease) ([place])    Dyspnea     Past Surgical History:  Procedure Laterality Date   CATARACT EXTRACTION Right 03/10/2021   IR IMAGING GUIDED PORT INSERTION  07/27/2021    There were no vitals filed for this visit.   Subjective Assessment - 09/09/21 1024     Subjective  THis Chemo hit my hard- I got booster too and had fluids before and now again - just [place] , weakness - did not had any falls    Currently in Pain? No/denies                        IMPRESSION Maurice Simpson from 09/04/21: Routine follow-up.   Patient seen in infusion.  Is accompanied by his wife.   Patient reports he is doing reasonably well.  He denies significant changes or concerns.  He reports improved appetite and plans to eat a steak tonight for dinner.  Pain is reasonably well controlled on as needed oxycodone.  No adverse effects reported from medications or treatments.   Patient reports stable performance status.  He is still working.  Denies recent falls.   PLAN: -Continue current scope of treatment -Oxycodone as needed for pain -Daily bowel regimen -Follow-up telephone visit 1 month  OT SCREEN 09/09/21: Patient seen this date while getting fluids.  Patient seeing symptoms management for post chemo checkup.  Patient with nausea and fatigue. OT did not do his strength and balance test. Wife with patient present today. Educated patient on keeping activity level up even during chemo.  If feeling better week 2 and  3 to do a lot more. Patient to do 150 minutes of exercise in a week-can divide that in 4 sessions of 6-7 minutes or 2 sessions of 12 to 14 minutes. -Sit to stand from a firm chair-feeling good tried no hands if need to use 1 or 2 hands to push up 10 reps -When walking to the bathroom trying to walk maybe 2 or 3 times extra. -Sidestepping at kitchen counter 1 to 2 minutes -Heel raises 10 reps. Patient can follow-up with me as needed.                               Visit Diagnosis: Chemotherapy-induced fatigue    Problem List Patient Active Problem List   Diagnosis Date Noted   Encounter for antineoplastic chemotherapy 08/25/2021   Lung cancer metastatic to brain ([place]) 08/25/2021   Thrush 08/25/2021   Weight loss 08/25/2021   Palliative care encounter    Febrile neutropenia ([place]) 08/12/2021   Sepsis ([place]) 08/12/2021   Postobstructive pneumonia 08/12/2021   Thrombocytopenia ([place]) 08/12/2021   Small cell lung cancer ([place]) 07/21/2021   Mass of lower lobe of right lung 06/24/2021   Weight loss, abnormal 06/24/2021   Cardiovascular risk factor 05/12/2021   Bee sting allergy 05/12/2021   Obesity 07/20/2019  Nicotine dependence, cigarettes, uncomplicated 61/22/4497   Family history of colon cancer 07/20/2019   COPD (chronic obstructive pulmonary disease) ([place]) 07/20/2019   Preventative health care 07/20/2019    Rosalyn Gess, OTR/L,CLT 09/09/2021, 10:27 AM  [facility] at [facility] [address] [place], [place], [address] Phone: [phone]   Fax:  [phone]  Name: Maurice Simpson MRN: [id number] Date of Birth: 12/25/1955

## 2021-09-09 NOTE — Progress Notes (Signed)
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  He had a lung cancer screening protocol on 05/29/2021 which showed a right lower lobe lung mass with concern for bilateral adrenal masses.  This was followed by a PET CT scan on 06/15/2021 which showed a right lower lobe soft tissue mass with an SUV uptake of 16.6.  No hypermetabolic hilar or mediastinal adenopathy.  Bilateral hypermetabolic adrenal masses 6.7 and 5.1 cm respectively.  2.8 cm soft tissue lesion in the body of the pancreas and 1.9 cm soft tissue lesion in the tail of the pancreas.  2.3 cm retroperitoneal soft tissue nodule.  10 cm x 6.4 cm soft tissue mass inferior to the transverse colon in the midline.  4.1 x 2.4 cm right mesenteric soft tissue mass with an SUV of 11.7.   MRI brain was significant for multiple areas of intracranial metastatic disease.  Largest lesion in the left frontal lobe with significant edema internal hemorrhage and is causing a mass effect with 6 mm midline shift.  30 metastatic foci seen in the brain.  Patient is undergoing whole brain radiation for the same.     Abdominal mass biopsy showed high-grade carcinoma compatible with metastatic small cell carcinoma of lung origin.     Interval history- Patient is 66 year old male currently s/p cycle 2 of carbo-etoposide-tecentriq who returns to clinic for follow up after cycle 2, consideration of supportive care. He feels weak  and nauseous today. Hasn't taken anything for symptoms. Is hungry. Eating and drinking. No vomiting. Cough has improved somewhat.   ECOG PS- 1 Pain scale- 0 Opioid associated constipation- no  Review of systems- Review of Systems  Constitutional:  Positive for malaise/fatigue. Negative for chills, fever and weight loss.  HENT:  Negative for congestion, ear discharge and nosebleeds.   Respiratory:  Positive for cough (per wife, less frequent.) and shortness of breath (with exertion. Somewhat improved.). Negative for hemoptysis, sputum production and wheezing.   Cardiovascular:  Negative for chest pain, palpitations, orthopnea and claudication.  Gastrointestinal:  Positive for nausea. Negative for abdominal pain, blood in stool, constipation, diarrhea, heartburn, melena and vomiting.  Genitourinary:  Negative for dysuria, flank pain, frequency, hematuria and urgency.  Musculoskeletal:  Negative for back pain, joint pain and myalgias.  Skin:  Negative for rash.  Neurological:  Negative for dizziness, tingling, focal weakness, seizures, weakness and headaches.  Endo/Heme/Allergies:  Does not bruise/bleed easily.  Psychiatric/Behavioral:  Negative for depression and suicidal ideas. The patient does not have insomnia.       Allergies  Allergen Reactions   Bee Venom      Past Medical History:  Diagnosis Date   Cancer ([place])    COPD (chronic obstructive pulmonary disease) ([place])    Dyspnea      Past Surgical History:  Procedure Laterality Date   CATARACT EXTRACTION Right 03/10/2021   IR IMAGING GUIDED PORT INSERTION  07/27/2021    Social History   Socioeconomic History   Marital status: Married  Spouse name: Not on file   Number of children: Not on file   Years of education: Not on file   Highest education level: Not on file  Occupational History   Not on file  Tobacco Use   Smoking status: Every Day    Packs/day: 2.00    Years: 51.00    Total pack years: 102.00     Types: Cigarettes   Smokeless tobacco: Never  Vaping Use   Vaping Use: Never used  Substance and Sexual Activity   Alcohol use: Not Currently   Drug use: Never   Sexual activity: Yes  Other Topics Concern   Not on file  Social History Narrative   Not on file   Social Determinants of Health   Financial Resource Strain: Low Risk  (07/13/2021)   Overall Financial Resource Strain (CARDIA)    Difficulty of Paying Living Expenses: Not hard at all  Food Insecurity: No Food Insecurity (07/13/2021)   Hunger Vital Sign    Worried About Running Out of Food in the Last Year: Never true    Ran Out of Food in the Last Year: Never true  Transportation Needs: No Transportation Needs (07/13/2021)   PRAPARE - Hydrologist (Medical): No    Lack of Transportation (Non-Medical): No  Physical Activity: Inactive (07/20/2019)   Exercise Vital Sign    Days of Exercise per Week: 0 days    Minutes of Exercise per Session: 0 min  Stress: Stress Concern Present (07/13/2021)   [place]    Feeling of Stress : To some extent  Social Connections: Moderately Isolated (07/13/2021)   Social Connection and Isolation Panel [NHANES]    Frequency of Communication with Friends and Family: More than three times a week    Frequency of Social Gatherings with Friends and Family: Twice a week    Attends Religious Services: Never    Marine scientist or Organizations: No    Attends Archivist Meetings: Never    Marital Status: Married  Human resources officer Violence: Not At Risk (07/13/2021)   Humiliation, Afraid, Rape, and Kick questionnaire    Fear of Current or Ex-Partner: No    Emotionally Abused: No    Physically Abused: No    Sexually Abused: No    Family History  Problem Relation Age of Onset   Diabetes Mother    Hypertension Mother    Diabetes Father    Hypertension Father    Diabetes Sister    Cancer - Colon  Cousin      Current Outpatient Medications:    albuterol (PROVENTIL) (2.5 MG/3ML) 0.083% nebulizer solution, Take 3 mLs (2.5 mg total) by nebulization every 6 (six) hours as needed for wheezing or shortness of breath., Disp: 75 mL, Rfl: 12   budesonide (PULMICORT) 0.25 MG/2ML nebulizer solution, Take 2 mLs (0.25 mg total) by nebulization 2 (two) times daily as needed., Disp: 60 mL, Rfl: 12   clotrimazole (LOTRIMIN) 1 % external solution, SMARTSIG:3-4 Drop(s) In Ear(s) Twice Daily, Disp: , Rfl:    dexamethasone (DECADRON) 4 MG tablet, Take 1 tablet by mouth twice a day for 7 days, then take 1 tablet by mouth daily for 7 days, then take 0.5 tablet by mouth daily for 7 days, then stop (Patient not taking: Reported on 09/01/2021), Disp: 30 tablet, Rfl: 0   dronabinol (MARINOL) 5 MG capsule, Take 1 capsule (5 mg total) by mouth 2 (two) times  daily before lunch and supper. (Patient not taking: Reported on 09/01/2021), Disp: 30 capsule, Rfl: 0   NEOMYCIN-POLYMYXIN-HYDROCORTISONE (CORTISPORIN) 1 % SOLN OTIC solution, SMARTSIG:3-4 Drop(s) In Ear(s) Twice Daily, Disp: , Rfl:    nystatin (MYCOSTATIN) 100000 UNIT/ML suspension, Take 5 mLs (500,000 Units total) by mouth 3 (three) times daily., Disp: 180 mL, Rfl: 0   ondansetron (ZOFRAN) 4 MG tablet, Take 1 tablet (4 mg total) by mouth every 8 (eight) hours as needed for nausea or vomiting. (Patient not taking: Reported on 09/01/2021), Disp: 60 tablet, Rfl: 3   oxyCODONE (OXY IR/ROXICODONE) 5 MG immediate release tablet, Take 1 tablet (5 mg total) by mouth every 4 (four) hours as needed for severe pain., Disp: 60 tablet, Rfl: 0   pantoprazole (PROTONIX) 20 MG tablet, TAKE 1 TABLET BY MOUTH EVERY DAY, Disp: 90 tablet, Rfl: 0   potassium chloride SA (KLOR-CON M) 20 MEQ tablet, Take 1 tablet (20 mEq total) by mouth 2 (two) times daily for 2 days. (Patient not taking: Reported on 09/01/2021), Disp: 4 tablet, Rfl: 0   prochlorperazine (COMPAZINE) 10 MG tablet, Take 1  tablet (10 mg total) by mouth every 6 (six) hours as needed for nausea or vomiting., Disp: 60 tablet, Rfl: 2   traZODone (DESYREL) 50 MG tablet, TAKE 1 TABLET BY MOUTH AT BEDTIME AS NEEDED FOR SLEEP. (Patient not taking: Reported on 09/01/2021), Disp: 90 tablet, Rfl: 1   valACYclovir (VALTREX) 1000 MG tablet, Take 1 tablet (1,000 mg total) by mouth daily., Disp: 10 tablet, Rfl: 10 No current facility-administered medications for this visit.  Facility-Administered Medications Ordered in Other Visits:    0.9 %  sodium chloride infusion, , Intravenous, Continuous, Verlon Au, NP, Last Rate: 999 mL/hr at 09/09/21 0934, New Bag at 09/09/21 0934   heparin lock flush 100 unit/mL, 500 Units, Intravenous, Once, Verlon Au, NP   sodium chloride flush (NS) 0.9 % injection 10 mL, 10 mL, Intravenous, Once, Verlon Au, NP  Physical exam:  Vitals:   09/09/21 0940  BP: 115/82  Pulse: 82  Resp: 18  Temp: (!) 97.5 F (36.4 C)  TempSrc: Tympanic  SpO2: 97%   Physical Exam Constitutional:      General: He is not in acute distress.    Appearance: He is well-developed.     Comments: Fatigued appearing. Accompanied by wife.   Eyes:     General: No scleral icterus. Cardiovascular:     Rate and Rhythm: Normal rate.     Heart sounds: Normal heart sounds.  Pulmonary:     Effort: Pulmonary effort is normal.     Comments: Mild expiratory wheeze intermittently Musculoskeletal:        General: No deformity.  Skin:    General: Skin is warm and dry.  Neurological:     Mental Status: He is alert and oriented to person, place, and time.  Psychiatric:        Behavior: Behavior normal.         Latest Ref Rng & Units 09/01/2021    9:20 AM  CMP  Glucose 70 - 99 mg/dL 127   BUN 8 - 23 mg/dL 10   Creatinine 0.61 - 1.24 mg/dL 0.94   Sodium 135 - 145 mmol/L 138   Potassium 3.5 - 5.1 mmol/L 3.0   Chloride 98 - 111 mmol/L 104   CO2 22 - 32 mmol/L 26   Calcium 8.9 - 10.3 mg/dL 8.9   Total  Protein 6.5 - 8.1 g/dL 7.6  Total Bilirubin 0.3 - 1.2 mg/dL 0.8   Alkaline Phos 38 - 126 U/L 63   AST 15 - 41 U/L 22   ALT 0 - 44 U/L 18       Latest Ref Rng & Units 09/01/2021    9:20 AM  CBC  WBC 4.0 - 10.5 K/uL 11.4   Hemoglobin 13.0 - 17.0 g/dL 14.5   Hematocrit 39.0 - 52.0 % 41.6   Platelets 150 - 400 K/uL 401     No images are attached to the encounter.  ECHOCARDIOGRAM COMPLETE  Result Date: 08/14/2021    ECHOCARDIOGRAM REPORT   Patient Name:   LYON DUMONT Date of Exam: 08/14/2021 Medical Rec #:  [id number]    Height:       68.0 in Accession #:    [id number]   Weight:       208.8 lb Date of Birth:  04-21-1955    BSA:          2.081 m Patient Age:    23 years     BP:           110/63 mmHg Patient Gender: M            HR:           80 bpm. Exam Location:  [facility] Procedure: 2D Echo, Color Doppler and Cardiac Doppler Indications:     R78.81 Bacteremia  History:         Patient has prior history of Echocardiogram examinations, most                  recent 06/17/2021. COPD, Signs/Symptoms:Fever; Risk                  Factors:Current Smoker.  Sonographer:     Charmayne Sheer Referring Phys:  [id number] Tsosie Billing Diagnosing Phys: Nelva Bush MD  Sonographer Comments: Technically difficult study due to poor echo windows. Image acquisition challenging due to patient body habitus and Image acquisition challenging due to COPD. IMPRESSIONS  1. Left ventricular ejection fraction, by estimation, is 60 to 65%. The left ventricle has normal function. Left ventricular endocardial border not optimally defined to evaluate regional wall motion. Left ventricular diastolic parameters were normal.  2. Right ventricular systolic function is normal. The right ventricular size is normal.  3. The mitral valve is grossly normal. Trivial mitral valve regurgitation. No evidence of mitral stenosis.  4. The aortic valve was not well visualized. Aortic valve regurgitation is not visualized. No aortic stenosis is present.   5. The inferior vena cava is normal in size with greater than 50% respiratory variability, suggesting right atrial pressure of 3 mmHg. FINDINGS  Left Ventricle: Left ventricular ejection fraction, by estimation, is 60 to 65%. The left ventricle has normal function. Left ventricular endocardial border not optimally defined to evaluate regional wall motion. The left ventricular internal cavity size was normal in size. There is no left ventricular hypertrophy. Left ventricular diastolic parameters were normal. Right Ventricle: The right ventricular size is normal. No increase in right ventricular wall thickness. Right ventricular systolic function is normal. Left Atrium: Left atrial size was normal in size. Right Atrium: Right atrial size was normal in size. Pericardium: There is no evidence of pericardial effusion. Mitral Valve: The mitral valve is grossly normal. Trivial mitral valve regurgitation. No evidence of mitral valve stenosis. Tricuspid Valve: The tricuspid valve is normal in structure. Tricuspid valve regurgitation is not demonstrated. Aortic Valve: The aortic valve was not well  visualized. Aortic valve regurgitation is not visualized. No aortic stenosis is present. Aortic valve mean gradient measures 4.0 mmHg. Aortic valve peak gradient measures 9.6 mmHg. Aortic valve area, by VTI measures 2.13 cm. Pulmonic Valve: The pulmonic valve was not well visualized. Pulmonic valve regurgitation is not visualized. No evidence of pulmonic stenosis. Aorta: The aortic root is normal in size and structure. Pulmonary Artery: The pulmonary artery is not well seen. Venous: The inferior vena cava is normal in size with greater than 50% respiratory variability, suggesting right atrial pressure of 3 mmHg. IAS/Shunts: The interatrial septum was not well visualized.  LEFT VENTRICLE PLAX 2D LVIDd:         4.57 cm   Diastology LVIDs:         2.72 cm   LV e' medial:    7.83 cm/s LV PW:         0.94 cm   LV E/e' medial:  8.6 LV  IVS:        0.96 cm   LV e' lateral:   9.90 cm/s LVOT diam:     1.90 cm   LV E/e' lateral: 6.8 LV SV:         58 LV SV Index:   28 LVOT Area:     2.84 cm  RIGHT VENTRICLE RV Basal diam:  3.20 cm LEFT ATRIUM             Index        RIGHT ATRIUM           Index LA diam:        2.70 cm 1.30 cm/m   RA Area:     10.30 cm LA Vol (A2C):   30.1 ml 14.46 ml/m  RA Volume:   18.40 ml  8.84 ml/m LA Vol (A4C):   32.5 ml 15.61 ml/m LA Biplane Vol: 31.8 ml 15.28 ml/m  AORTIC VALVE                    PULMONIC VALVE AV Area (Vmax):    1.94 cm     PV Vmax:       0.90 m/s AV Area (Vmean):   2.21 cm     PV Peak grad:  3.2 mmHg AV Area (VTI):     2.13 cm AV Vmax:           155.00 cm/s AV Vmean:          93.400 cm/s AV VTI:            0.274 m AV Peak Grad:      9.6 mmHg AV Mean Grad:      4.0 mmHg LVOT Vmax:         106.00 cm/s LVOT Vmean:        72.900 cm/s LVOT VTI:          0.206 m LVOT/AV VTI ratio: 0.75  AORTA Ao Root diam: 3.50 cm MITRAL VALVE MV Area (PHT): 2.84 cm    SHUNTS MV Decel Time: 267 msec    Systemic VTI:  0.21 m MV E velocity: 67.30 cm/s  Systemic Diam: 1.90 cm MV A velocity: 73.70 cm/s MV E/A ratio:  0.91 Harrell Gave End MD Electronically signed by Nelva Bush MD Signature Date/Time: 08/14/2021/1:51:17 PM    Final    DG Chest 2 View  Result Date: 08/12/2021 CLINICAL DATA:  Sepsis, syncope, short of breath EXAM: CHEST - 2 VIEW COMPARISON:  04/30/2021 FINDINGS: 3 Port in the anterior chest  wall with tip in distal SVC. Normal mediastinum and cardiac silhouette. Normal pulmonary vasculature Density over the heart on lateral projection is favored RIGHT middle lobe opacity. No pleural fluid IMPRESSION: Concern for RIGHT middle lobe pneumonia or atelectasis Electronically Signed   By: Suzy Bouchard M.D.   On: 08/12/2021 13:17     Assessment and plan- Patient is a 66 y.o. male with   Extensive stage small cell lung cancer - s/p cycle 2 carbo-etoposide-tecentriq chemotherapy. Carboplatin dose reduced to  AUC 4 d/t neutropenia and udenyca added. See below. Will get imaging scheduled prior to cycle 3 per Dr. Elroy Channel request.  Convalescence after chemotherapy- iv fluids today in clinic. Given nausea, he will rtc for iv fluids as needed. Minimize steroids d/t use of tecentriq and side effects. He is followed by Gwenette Greet with rehab/OT to assist in maintaining strength and mobility during treatment.  Nausea d/t chemotherapy- IV zofran and compazine in clinic. Reviewed use of home anti-emetics. Encouraged scheduled zofran week after treatment and compazine as needed.  Chemotherapy induced anemia- hemoglobin 12.7. Monitor.  Chemotherapy induced thrombocytopenia- plt 131. Monitor.  Hypokalemia- K 3.5 today. Previously 3.0. He received additional K with his treatment. Restart KCl 20 meq daily. Monitor.  Neutropenic fever, severe sepsis- from Pseudomonas after cycle 1. Cycle 2 delayed d/t antibiotic. Now recovered.  Chemotherapy induced neutropenia- udenyca received with cycle 2.  Neoplasm related pain- well controlled on oxycodone. He is followed by palliative care. Appreciate their input.  Cough- secondary to disease. Improved. Monitor.  Goals of care- treatment given with palliative intent.   I will see him back as scheduled for additional supportive care next week and he will see Dr. Janese Banks as scheduled for consideration of cycle 3 as scheduled.   Visit Diagnosis 1. Small cell lung cancer ([place])   2. Convalescence following chemotherapy   3. Chemotherapy-induced nausea   4. Hypokalemia     Beckey Rutter, DNP, AGNP-C [place] at [facility] [phone] (clinic) 09/09/2021

## 2021-09-09 NOTE — Progress Notes (Signed)
Pt returns for follow-up post chemo. Reports weakness, fatigue, and nausea today. Has not taken any PRN's this morning.

## 2021-09-10 ENCOUNTER — Inpatient Hospital Stay: Payer: Medicare HMO | Admitting: Hospice and Palliative Medicine

## 2021-09-10 ENCOUNTER — Other Ambulatory Visit: Payer: Self-pay

## 2021-09-10 ENCOUNTER — Inpatient Hospital Stay: Payer: Medicare HMO

## 2021-09-10 ENCOUNTER — Other Ambulatory Visit: Payer: Self-pay | Admitting: Hospice and Palliative Medicine

## 2021-09-10 ENCOUNTER — Telehealth: Payer: Self-pay | Admitting: *Deleted

## 2021-09-10 DIAGNOSIS — R197 Diarrhea, unspecified: Secondary | ICD-10-CM

## 2021-09-10 NOTE — Telephone Encounter (Signed)
I called twice and left message with wife about coming in and getting labs and possible fluids. The wife called back and said that he will not come into today. So I spoke to [employer] and they both say that it is ok to take immodium. Also I have made a bag with hat to collect specimen. There are 2 bottles to be filled with stool. I told her on one tube it has orange label and the stool has to at the level on orange label. The other one needs a spoonful. Wife understands and she will come get the bag and will bring the stool specimens back to [place] today hopefully

## 2021-09-10 NOTE — Telephone Encounter (Signed)
RN spoke with Merrily Pew, NP who advises pt to come in today for lab/ smc and possible hydration. We will need to check stool for c-diff and gi panels as well given the amt of diarrhea.  Left vm for patient's wife to return my phone call as soon as possible to coordinate an [facility] apt.

## 2021-09-10 NOTE — Telephone Encounter (Signed)
Maurice Simpson and patient in background called reporting that since yesterday patient has had >10 loose not watery stools with lower abdominal pain, but no bloating noted. He was told not to take Imodium with the type of chemotherapy he is getting so hey are asking prescription be sent to pharmacy for him. He is eating and drinking well. While on the phone he had the urge to go again. Please advise

## 2021-09-15 ENCOUNTER — Ambulatory Visit: Payer: Medicare HMO

## 2021-09-15 ENCOUNTER — Other Ambulatory Visit: Payer: Medicare HMO

## 2021-09-15 ENCOUNTER — Inpatient Hospital Stay: Payer: Medicare HMO

## 2021-09-15 ENCOUNTER — Inpatient Hospital Stay (HOSPITAL_BASED_OUTPATIENT_CLINIC_OR_DEPARTMENT_OTHER): Payer: Medicare HMO | Admitting: Nurse Practitioner

## 2021-09-15 ENCOUNTER — Ambulatory Visit: Payer: Medicare HMO | Admitting: Medical Oncology

## 2021-09-15 ENCOUNTER — Inpatient Hospital Stay: Payer: Medicare HMO | Attending: Oncology

## 2021-09-15 ENCOUNTER — Encounter: Payer: Medicare HMO | Admitting: Hospice and Palliative Medicine

## 2021-09-15 ENCOUNTER — Ambulatory Visit: Payer: Medicare HMO | Admitting: Oncology

## 2021-09-15 VITALS — BP 130/76 | HR 89 | Temp 99.4°F | Resp 16

## 2021-09-15 DIAGNOSIS — E876 Hypokalemia: Secondary | ICD-10-CM

## 2021-09-15 DIAGNOSIS — C7931 Secondary malignant neoplasm of brain: Secondary | ICD-10-CM | POA: Insufficient documentation

## 2021-09-15 DIAGNOSIS — Z79899 Other long term (current) drug therapy: Secondary | ICD-10-CM | POA: Insufficient documentation

## 2021-09-15 DIAGNOSIS — R110 Nausea: Secondary | ICD-10-CM

## 2021-09-15 DIAGNOSIS — C3490 Malignant neoplasm of unspecified part of unspecified bronchus or lung: Secondary | ICD-10-CM

## 2021-09-15 DIAGNOSIS — T451X5A Adverse effect of antineoplastic and immunosuppressive drugs, initial encounter: Secondary | ICD-10-CM

## 2021-09-15 DIAGNOSIS — C7970 Secondary malignant neoplasm of unspecified adrenal gland: Secondary | ICD-10-CM | POA: Diagnosis not present

## 2021-09-15 DIAGNOSIS — F17210 Nicotine dependence, cigarettes, uncomplicated: Secondary | ICD-10-CM | POA: Insufficient documentation

## 2021-09-15 DIAGNOSIS — Z5112 Encounter for antineoplastic immunotherapy: Secondary | ICD-10-CM | POA: Insufficient documentation

## 2021-09-15 DIAGNOSIS — D701 Agranulocytosis secondary to cancer chemotherapy: Secondary | ICD-10-CM | POA: Diagnosis not present

## 2021-09-15 DIAGNOSIS — C786 Secondary malignant neoplasm of retroperitoneum and peritoneum: Secondary | ICD-10-CM | POA: Diagnosis not present

## 2021-09-15 DIAGNOSIS — Z5111 Encounter for antineoplastic chemotherapy: Secondary | ICD-10-CM | POA: Insufficient documentation

## 2021-09-15 DIAGNOSIS — C3431 Malignant neoplasm of lower lobe, right bronchus or lung: Secondary | ICD-10-CM | POA: Diagnosis not present

## 2021-09-15 DIAGNOSIS — Z5189 Encounter for other specified aftercare: Secondary | ICD-10-CM

## 2021-09-15 LAB — COMPREHENSIVE METABOLIC PANEL
ALT: 29 U/L (ref 0–44)
AST: 33 U/L (ref 15–41)
Albumin: 3.4 g/dL — ABNORMAL LOW (ref 3.5–5.0)
Alkaline Phosphatase: 98 U/L (ref 38–126)
Anion gap: 12 (ref 5–15)
BUN: 13 mg/dL (ref 8–23)
CO2: 25 mmol/L (ref 22–32)
Calcium: 9 mg/dL (ref 8.9–10.3)
Chloride: 97 mmol/L — ABNORMAL LOW (ref 98–111)
Creatinine, Ser: 0.96 mg/dL (ref 0.61–1.24)
GFR, Estimated: >60 mL/min (ref >60)
Glucose, Bld: 145 mg/dL — ABNORMAL HIGH (ref 70–99)
Potassium: 3.3 mmol/L — ABNORMAL LOW (ref 3.5–5.1)
Sodium: 134 mmol/L — ABNORMAL LOW (ref 135–145)
Total Bilirubin: 0.9 mg/dL (ref 0.3–1.2)
Total Protein: 7.9 g/dL (ref 6.5–8.1)

## 2021-09-15 LAB — CBC WITH DIFFERENTIAL/PLATELET
Abs Immature Granulocytes: 1.4 10*3/uL — ABNORMAL HIGH (ref 0.00–0.07)
Band Neutrophils: 3 %
Basophils Absolute: 0 10*3/uL (ref 0.0–0.1)
Basophils Relative: 0 %
Blasts: 0 %
Eosinophils Absolute: 0 10*3/uL (ref 0.0–0.5)
Eosinophils Relative: 0 %
HCT: 33.4 % — ABNORMAL LOW (ref 39.0–52.0)
Hemoglobin: 11.7 g/dL — ABNORMAL LOW (ref 13.0–17.0)
Lymphocytes Relative: 15 %
Lymphs Abs: 2.6 10*3/uL (ref 0.7–4.0)
MCH: 31.4 pg (ref 26.0–34.0)
MCHC: 35 g/dL (ref 30.0–36.0)
MCV: 89.5 fL (ref 80.0–100.0)
Metamyelocytes Relative: 2 %
Monocytes Absolute: 0.7 10*3/uL (ref 0.1–1.0)
Monocytes Relative: 4 %
Myelocytes: 6 %
Neutro Abs: 12.8 10*3/uL — ABNORMAL HIGH (ref 1.7–7.7)
Neutrophils Relative %: 70 %
Other: 0 %
Platelets: 111 10*3/uL — ABNORMAL LOW (ref 150–400)
Promyelocytes Relative: 0 %
RBC: 3.73 MIL/uL — ABNORMAL LOW (ref 4.22–5.81)
RDW: 13.9 % (ref 11.5–15.5)
Smear Review: DECREASED
WBC: 17.5 10*3/uL — ABNORMAL HIGH (ref 4.0–10.5)
nRBC: 0 /100 WBC
nRBC: 0.1 % (ref 0.0–0.2)

## 2021-09-15 MED ORDER — SODIUM CHLORIDE 0.9 % IV SOLN: 8.0000 mg | Freq: Once | INTRAVENOUS | Status: DC

## 2021-09-15 MED ORDER — SODIUM CHLORIDE 0.9 % IV SOLN
INTRAVENOUS | Status: AC
  Filled 2021-09-15 (×2): qty 250

## 2021-09-15 MED ORDER — DEXAMETHASONE 4 MG PO TABS: 4.0000 mg | ORAL_TABLET | Freq: Every day | ORAL | 0 refills | Status: DC

## 2021-09-15 MED ORDER — HEPARIN SOD (PORK) LOCK FLUSH 100 UNIT/ML IV SOLN
500.0000 [IU] | Freq: Once | INTRAVENOUS | Status: AC
  Administered 2021-09-15: 500 [IU] via INTRAVENOUS
  Filled 2021-09-15: qty 5

## 2021-09-15 MED ORDER — SODIUM CHLORIDE 0.9 % IV SOLN
10.0000 mg | Freq: Once | INTRAVENOUS | Status: AC
  Administered 2021-09-15: 10 mg via INTRAVENOUS
  Filled 2021-09-15: qty 10

## 2021-09-15 MED ORDER — OLANZAPINE 10 MG PO TABS: 10.0000 mg | ORAL_TABLET | Freq: Every day | ORAL | 0 refills | Status: DC

## 2021-09-15 MED ORDER — ONDANSETRON HCL 4 MG/2ML IJ SOLN
8.0000 mg | Freq: Once | INTRAMUSCULAR | Status: AC
  Administered 2021-09-15: 8 mg via INTRAVENOUS
  Filled 2021-09-15: qty 4

## 2021-09-15 MED ORDER — POTASSIUM CHLORIDE CRYS ER 20 MEQ PO TBCR: 20.0000 meq | EXTENDED_RELEASE_TABLET | Freq: Two times a day (BID) | ORAL | 0 refills | Status: DC

## 2021-09-15 MED ORDER — SODIUM CHLORIDE 0.9% FLUSH
10.0000 mL | Freq: Once | INTRAVENOUS | Status: AC
  Administered 2021-09-15: 10 mL via INTRAVENOUS
  Filled 2021-09-15: qty 10

## 2021-09-15 NOTE — Progress Notes (Signed)
Returns for follow-up. Reports weakness. States that he fell this weekend in his garage. Took him about an hour to get himself off the floor, as no one was around. Sustained abrasions to left elbow and left knee. Diarrhea has stopped and was unable to produce stool for specimen. Reports poor appetite and fluid intake. States that he is nauseated.

## 2021-09-15 NOTE — Progress Notes (Signed)
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  He had a lung cancer screening protocol on 05/29/2021 which showed a right lower lobe lung mass with concern for bilateral adrenal masses.  This was followed by a PET CT scan on 06/15/2021 which showed a right lower lobe soft tissue mass with an SUV uptake of 16.6.  No hypermetabolic hilar or mediastinal adenopathy.  Bilateral hypermetabolic adrenal masses 6.7 and 5.1 cm respectively.  2.8 cm soft tissue lesion in the body of the pancreas and 1.9 cm soft tissue lesion in the tail of the pancreas.  2.3 cm retroperitoneal soft tissue nodule.  10 cm x 6.4 cm soft tissue mass inferior to the transverse colon in the midline.  4.1 x 2.4 cm right mesenteric soft tissue mass with an SUV of 11.7.   MRI brain was significant for multiple areas of intracranial metastatic disease.  Largest lesion in the left frontal lobe with significant edema internal hemorrhage and is causing a mass effect with 6 mm midline shift.  30 metastatic foci seen in the brain.  Patient is undergoing whole brain radiation for the same.     Abdominal mass biopsy showed high-grade carcinoma compatible with metastatic small cell carcinoma of lung origin.     Interval history- Patient is 66 year old male currently s/p cycle 2 of carbo-etoposide-tecentriq who returns to clinic for follow up after cycle 2, consideration of supportive care. He continues to  have ongoing nausea. Weakness is worse. Oral intake is worse. Legs 'gave out' on him and he fell at home. Small abrasion on forearm but no injuries otherwise. Has ongoing taste alterations which inhibits his intake. Ongoing cough. Had ct scheduled for today but felt too poorly to undergo. Was having diarrhea until Friday then hasn't had BM since. He is using chair in shower.   ECOG PS- 3 Pain scale- 0 Opioid associated constipation- no  Review of systems- Review of Systems  Constitutional:  Positive for malaise/fatigue. Negative for chills, fever and weight loss.  HENT:  Negative for congestion, ear discharge and nosebleeds.        Dry mouth  Respiratory:  Positive for cough (per wife, less frequent.) and shortness of breath (with exertion. Somewhat improved.). Negative for hemoptysis, sputum production and wheezing.   Cardiovascular:  Negative for chest pain, palpitations, orthopnea and claudication.  Gastrointestinal:  Positive for constipation, diarrhea and nausea. Negative for abdominal pain, blood in stool, heartburn, melena and vomiting.  Genitourinary:  Negative for dysuria, flank pain, frequency, hematuria and urgency.  Musculoskeletal:  Positive for falls. Negative for back pain, joint pain and myalgias.  Skin:  Negative for rash.  Neurological:  Positive for weakness. Negative for dizziness, tingling, focal weakness, seizures and headaches.  Endo/Heme/Allergies:  Does not bruise/bleed easily.  Psychiatric/Behavioral:  Negative for depression and suicidal ideas. The patient does not have insomnia.       Allergies  Allergen Reactions   Bee Venom      Past Medical History:  Diagnosis Date   Cancer [facility])    COPD (chronic obstructive pulmonary disease) ([place])    Dyspnea      Past Surgical History:  Procedure Laterality Date   CATARACT EXTRACTION Right 03/10/2021   IR IMAGING GUIDED PORT INSERTION  07/27/2021    Social History   Socioeconomic History   Marital status:  Married    Spouse name: Not on file   Number of children: Not on file   Years of education: Not on file   Highest education level: Not on file  Occupational History   Not on file  Tobacco Use   Smoking status: Every Day    Packs/day: 2.00    Years: 51.00    Total pack years: 102.00    Types: Cigarettes   Smokeless tobacco: Never  Vaping Use   Vaping Use: Never used  Substance and Sexual Activity   Alcohol use: Not Currently   Drug use: Never   Sexual activity: Yes  Other Topics Concern   Not on file  Social History Narrative   Not on file   Social Determinants of Health   Financial Resource Strain: Low Risk  (07/13/2021)   Overall Financial Resource Strain (CARDIA)    Difficulty of Paying Living Expenses: Not hard at all  Food Insecurity: No Food Insecurity (07/13/2021)   Hunger Vital Sign    Worried About Running Out of Food in the Last Year: Never true    Ran Out of Food in the Last Year: Never true  Transportation Needs: No Transportation Needs (07/13/2021)   PRAPARE - Hydrologist (Medical): No    Lack of Transportation (Non-Medical): No  Physical Activity: Inactive (07/20/2019)   Exercise Vital Sign    Days of Exercise per Week: 0 days    Minutes of Exercise per Session: 0 min  Stress: Stress Concern Present (07/13/2021)   Desha    Feeling of Stress : To some extent  Social Connections: Moderately Isolated (07/13/2021)   Social Connection and Isolation Panel [NHANES]    Frequency of Communication with Friends and Family: More than three times a week    Frequency of Social Gatherings with Friends and Family: Twice a week    Attends Religious Services: Never    Marine scientist or Organizations: No    Attends Archivist Meetings: Never    Marital Status: Married  Human resources officer Violence: Not At Risk (07/13/2021)   Humiliation, Afraid, Rape, and Kick  questionnaire    Fear of Current or Ex-Partner: No    Emotionally Abused: No    Physically Abused: No    Sexually Abused: No    Family History  Problem Relation Age of Onset   Diabetes Mother    Hypertension Mother    Diabetes Father    Hypertension Father    Diabetes Sister    Cancer - Colon Cousin      Current Outpatient Medications:    albuterol (PROVENTIL) (2.5 MG/3ML) 0.083% nebulizer solution, Take 3 mLs (2.5 mg total) by nebulization every 6 (six) hours as needed for wheezing or shortness of breath., Disp: 75 mL, Rfl: 12   budesonide (PULMICORT) 0.25 MG/2ML nebulizer solution, Take 2 mLs (0.25 mg total) by nebulization 2 (two) times daily as needed., Disp: 60 mL, Rfl: 12   clotrimazole (LOTRIMIN) 1 % external solution, SMARTSIG:3-4 Drop(s) In Ear(s) Twice Daily, Disp: , Rfl:    dexamethasone (DECADRON) 4 MG  tablet, Take 1 tablet by mouth twice a day for 7 days, then take 1 tablet by mouth daily for 7 days, then take 0.5 tablet by mouth daily for 7 days, then stop (Patient not taking: Reported on 09/01/2021), Disp: 30 tablet, Rfl: 0   dronabinol (MARINOL) 5 MG capsule, Take 1 capsule (5 mg total) by mouth 2 (two) times daily before lunch and supper. (Patient not taking: Reported on 09/01/2021), Disp: 30 capsule, Rfl: 0   NEOMYCIN-POLYMYXIN-HYDROCORTISONE (CORTISPORIN) 1 % SOLN OTIC solution, SMARTSIG:3-4 Drop(s) In Ear(s) Twice Daily, Disp: , Rfl:    nystatin (MYCOSTATIN) 100000 UNIT/ML suspension, Take 5 mLs (500,000 Units total) by mouth 3 (three) times daily., Disp: 180 mL, Rfl: 0   ondansetron (ZOFRAN) 4 MG tablet, Take 1 tablet (4 mg total) by mouth every 8 (eight) hours as needed for nausea or vomiting. (Patient not taking: Reported on 09/01/2021), Disp: 60 tablet, Rfl: 3   oxyCODONE (OXY IR/ROXICODONE) 5 MG immediate release tablet, Take 1 tablet (5 mg total) by mouth every 4 (four) hours as needed for severe pain., Disp: 60 tablet, Rfl: 0   pantoprazole (PROTONIX) 20 MG tablet,  TAKE 1 TABLET BY MOUTH EVERY DAY, Disp: 90 tablet, Rfl: 0   potassium chloride SA (KLOR-CON M) 20 MEQ tablet, Take 1 tablet (20 mEq total) by mouth daily for 7 days., Disp: 7 tablet, Rfl: 0   prochlorperazine (COMPAZINE) 10 MG tablet, Take 1 tablet (10 mg total) by mouth every 6 (six) hours as needed for nausea or vomiting., Disp: 60 tablet, Rfl: 2   traZODone (DESYREL) 50 MG tablet, TAKE 1 TABLET BY MOUTH AT BEDTIME AS NEEDED FOR SLEEP. (Patient not taking: Reported on 09/01/2021), Disp: 90 tablet, Rfl: 1   valACYclovir (VALTREX) 1000 MG tablet, Take 1 tablet (1,000 mg total) by mouth daily., Disp: 10 tablet, Rfl: 10 No current facility-administered medications for this visit.  Facility-Administered Medications Ordered in Other Visits:    0.9 %  sodium chloride infusion, , Intravenous, Continuous, Verlon Au, NP, Last Rate: 999 mL/hr at 09/15/21 1022, New Bag at 09/15/21 1022   heparin lock flush 100 unit/mL, 500 Units, Intravenous, Once, Sindy Guadeloupe, MD   ondansetron [facility]) injection 8 mg, 8 mg, Intravenous, Once, Verlon Au, NP  Physical exam:  Vitals:   09/15/21 1010  BP: 130/76  Pulse: 89  Resp: 16  Temp: 99.4 F (37.4 C)  TempSrc: Tympanic  SpO2: 98%   Physical Exam Constitutional:      General: He is not in acute distress.    Appearance: He is well-developed.     Comments: Fatigued appearing. Accompanied by wife.   Eyes:     General: No scleral icterus. Cardiovascular:     Rate and Rhythm: Normal rate.     Heart sounds: Normal heart sounds.  Pulmonary:     Effort: Pulmonary effort is normal.     Comments: Mild expiratory wheeze intermittently Musculoskeletal:        General: No deformity.  Skin:    General: Skin is warm and dry.  Neurological:     Mental Status: He is alert and oriented to person, place, and time.  Psychiatric:        Behavior: Behavior normal.        Latest Ref Rng & Units 09/15/2021    9:30 AM  CMP  Glucose 70 - 99 mg/dL 145    BUN 8 - 23 mg/dL 13   Creatinine 0.61 - 1.24 mg/dL 0.96  Sodium 135 - 145 mmol/L 134   Potassium 3.5 - 5.1 mmol/L 3.3   Chloride 98 - 111 mmol/L 97   CO2 22 - 32 mmol/L 25   Calcium 8.9 - 10.3 mg/dL 9.0   Total Protein 6.5 - 8.1 g/dL 7.9   Total Bilirubin 0.3 - 1.2 mg/dL 0.9   Alkaline Phos 38 - 126 U/L 98   AST 15 - 41 U/L 33   ALT 0 - 44 U/L 29       Latest Ref Rng & Units 09/15/2021    9:30 AM  CBC  WBC 4.0 - 10.5 K/uL 17.5   Hemoglobin 13.0 - 17.0 g/dL 11.7   Hematocrit 39.0 - 52.0 % 33.4   Platelets 150 - 400 K/uL 111    06/16/21- PET Initial IMPRESSION: 1. Parahilar right lung mass of concern on recent lung cancer screening CT is markedly  hypermetabolic consistent with malignancy. This could be a primary bronchogenic neoplasm or metastatic involvement. 2. Hypermetabolic nodules are identified in the body and tail of pancreas with bilateral large necrotic hypermetabolic adrenal metastases. 3. Dominant bulky hypermetabolic soft tissue mass in the midline omentum/anterior mesentery. This does appear to be in contact with the inferior wall the transverse colon although the lesions largely external to the wall the colon. No definite incorporation of small-bowel loops into this lesion although assessment is limited by nondiagnostic CT imaging used for attenuation correction and lack of oral intravenous contrast material. 4. Additional smaller right mesenteric hypermetabolic mass and a right retroperitoneal hypermetabolic soft tissue nodule noted. 5. No obvious primary site of disease on the current exam. Midline omental nodule should be amenable to tissue sampling.  6. Cholelithiasis 7. Left colonic diverticulosis without diverticulitis  No results found.   Assessment and plan- Patient is a 66 y.o. male with   Extensive stage small cell lung cancer - s/p cycle 2 carbo-etoposide-tecentriq chemotherapy. Carboplatin dose reduced to AUC 4 d/t neutropenia and udenyca added. See below.  Was unable to undergo CT today d/t weakness. We will provide supportive care and get imaging rescheduled. He will follow up with Dr. Janese Banks for results.  Convalescence after chemotherapy- iv fluids today in clinic. Discussed need to minimize steroids in setting of tecentriq however, given worsening weakness feel benefit outweighs risk. Will give decadron 10 mg IV today then start oral decadron 4 mg daily tomorrow for 5 days.  Chemotherapy induced nausea-  He did have a large hypermetabolic abdominal mass but no worsening of pain or new vomiting. He has had fluctuating constipation and diarrhea but no evidence of obstruction. Symptoms more consistent with treatment induced nausea as opposed to progression of disease though not ruled out. He has imaging scheduled to evaluate as above. start olanzapine 10 mg nightly. Last ekg 08/17/21 with normal qt. Caution for EPS reviewed. Malnutrition- Taste changes. No improvement with marinol and he has discontinued. Reviewed that this may help with nausea and pain more so than appetite. Can also consider uptitrating to evaluate for effect. Encouraged small, calorie dense food and fluids. Continue ensure.  Impaired mobility & weakness- secondary to malignancy, chemotherapy, and malnutrition. Would benefit from 4 wheeled rolling walker with seat due to ongoing weakness and recent fall. Will also provide with urinal. He has shower chair at home. Declines BSC. He is followed by Gwenette Greet with rehab/OT to assist in maintaining strength and mobility during treatment.  Chemotherapy induced anemia- hemoglobin 11.7. Monitor.  Chemotherapy induced thrombocytopenia- plt 111. Monitor.  Hypokalemia- K 3.3 today despite  taking KCl daily. Increase to BID. Follow up with Dr. Janese Banks for continuation.   Neutropenic fever, severe sepsis- from Pseudomonas after cycle 1. Cycle 2 delayed d/t antibiotic. Now recovered.  Chemotherapy induced neutropenia- udenyca received with cycle 2.  Neoplasm related  pain- well controlled on oxycodone. He is followed by palliative care. Appreciate their input.  Cough- secondary to disease. Improved. Monitor.  Goals of care- treatment given with palliative intent. Patient does not have follow up with Josh Borders/palliative care scheduled. Patient will schedule follow up as needed.  At risk for pressure ulcer- symptomatic of sacral pain d/t poor mobility, impaired nutrition. Provided patient with foam dressing and discussed turning & offloading.   Disposition:  Friday- fluids in fluid clinic then CT scan rescheduled for afternoon Follow up with Dr. Janese Banks for discussion of results on 9/12  I will see him back as scheduled for additional supportive care next week and he will see Dr. Janese Banks as scheduled for consideration of cycle 3 as scheduled.   Visit Diagnosis No diagnosis found.   Beckey Rutter, DNP, AGNP-C [place] at [facility] [phone] (clinic) 09/15/2021

## 2021-09-16 ENCOUNTER — Ambulatory Visit: Payer: Medicare HMO

## 2021-09-16 ENCOUNTER — Other Ambulatory Visit: Payer: Self-pay

## 2021-09-16 DIAGNOSIS — C3490 Malignant neoplasm of unspecified part of unspecified bronchus or lung: Secondary | ICD-10-CM

## 2021-09-17 ENCOUNTER — Ambulatory Visit: Payer: Medicare HMO

## 2021-09-18 ENCOUNTER — Ambulatory Visit: Payer: Medicare HMO

## 2021-09-18 ENCOUNTER — Ambulatory Visit
Admission: RE | Admit: 2021-09-18 | Discharge: 2021-09-18 | Disposition: A | Discharge status: Home / Self Care | Payer: Medicare HMO | Source: Ambulatory Visit | Attending: Oncology | Admitting: Oncology

## 2021-09-18 ENCOUNTER — Inpatient Hospital Stay: Payer: Medicare HMO

## 2021-09-18 VITALS — BP 104/68 | HR 77 | Temp 96.8°F | Resp 16

## 2021-09-18 DIAGNOSIS — R918 Other nonspecific abnormal finding of lung field: Secondary | ICD-10-CM | POA: Diagnosis not present

## 2021-09-18 DIAGNOSIS — C3431 Malignant neoplasm of lower lobe, right bronchus or lung: Secondary | ICD-10-CM | POA: Diagnosis not present

## 2021-09-18 DIAGNOSIS — C3490 Malignant neoplasm of unspecified part of unspecified bronchus or lung: Secondary | ICD-10-CM

## 2021-09-18 DIAGNOSIS — Z95828 Presence of other vascular implants and grafts: Secondary | ICD-10-CM

## 2021-09-18 DIAGNOSIS — I700 Atherosclerosis of aorta: Secondary | ICD-10-CM | POA: Diagnosis not present

## 2021-09-18 DIAGNOSIS — Z5111 Encounter for antineoplastic chemotherapy: Secondary | ICD-10-CM | POA: Diagnosis not present

## 2021-09-18 DIAGNOSIS — J432 Centrilobular emphysema: Secondary | ICD-10-CM | POA: Diagnosis not present

## 2021-09-18 DIAGNOSIS — R112 Nausea with vomiting, unspecified: Secondary | ICD-10-CM

## 2021-09-18 DIAGNOSIS — T451X5A Adverse effect of antineoplastic and immunosuppressive drugs, initial encounter: Secondary | ICD-10-CM

## 2021-09-18 DIAGNOSIS — R1900 Intra-abdominal and pelvic swelling, mass and lump, unspecified site: Secondary | ICD-10-CM | POA: Diagnosis not present

## 2021-09-18 DIAGNOSIS — C7931 Secondary malignant neoplasm of brain: Secondary | ICD-10-CM | POA: Diagnosis not present

## 2021-09-18 DIAGNOSIS — C7970 Secondary malignant neoplasm of unspecified adrenal gland: Secondary | ICD-10-CM | POA: Diagnosis not present

## 2021-09-18 DIAGNOSIS — K5730 Diverticulosis of large intestine without perforation or abscess without bleeding: Secondary | ICD-10-CM | POA: Diagnosis not present

## 2021-09-18 DIAGNOSIS — D701 Agranulocytosis secondary to cancer chemotherapy: Secondary | ICD-10-CM | POA: Diagnosis not present

## 2021-09-18 DIAGNOSIS — R110 Nausea: Secondary | ICD-10-CM | POA: Diagnosis not present

## 2021-09-18 DIAGNOSIS — F17210 Nicotine dependence, cigarettes, uncomplicated: Secondary | ICD-10-CM | POA: Diagnosis not present

## 2021-09-18 DIAGNOSIS — K8020 Calculus of gallbladder without cholecystitis without obstruction: Secondary | ICD-10-CM | POA: Diagnosis not present

## 2021-09-18 DIAGNOSIS — Z5112 Encounter for antineoplastic immunotherapy: Secondary | ICD-10-CM | POA: Diagnosis not present

## 2021-09-18 DIAGNOSIS — C786 Secondary malignant neoplasm of retroperitoneum and peritoneum: Secondary | ICD-10-CM | POA: Diagnosis not present

## 2021-09-18 LAB — BASIC METABOLIC PANEL
Anion gap: 8 (ref 5–15)
BUN: 10 mg/dL (ref 8–23)
CO2: 25 mmol/L (ref 22–32)
Calcium: 8.9 mg/dL (ref 8.9–10.3)
Chloride: 104 mmol/L (ref 98–111)
Creatinine, Ser: 0.87 mg/dL (ref 0.61–1.24)
GFR, Estimated: >60 mL/min (ref >60)
Glucose, Bld: 141 mg/dL — ABNORMAL HIGH (ref 70–99)
Potassium: 2.8 mmol/L — ABNORMAL LOW (ref 3.5–5.1)
Sodium: 137 mmol/L (ref 135–145)

## 2021-09-18 LAB — CBC WITH DIFFERENTIAL/PLATELET
Abs Immature Granulocytes: 3.57 10*3/uL — ABNORMAL HIGH (ref 0.00–0.07)
Basophils Absolute: 0.1 10*3/uL (ref 0.0–0.1)
Basophils Relative: 0 %
Eosinophils Absolute: 0 10*3/uL (ref 0.0–0.5)
Eosinophils Relative: 0 %
HCT: 33.9 % — ABNORMAL LOW (ref 39.0–52.0)
Hemoglobin: 11.6 g/dL — ABNORMAL LOW (ref 13.0–17.0)
Immature Granulocytes: 12 %
Lymphocytes Relative: 15 %
Lymphs Abs: 4.6 10*3/uL — ABNORMAL HIGH (ref 0.7–4.0)
MCH: 31.6 pg (ref 26.0–34.0)
MCHC: 34.2 g/dL (ref 30.0–36.0)
MCV: 92.4 fL (ref 80.0–100.0)
Monocytes Absolute: 0.9 10*3/uL (ref 0.1–1.0)
Monocytes Relative: 3 %
Neutro Abs: 21.4 10*3/uL — ABNORMAL HIGH (ref 1.7–7.7)
Neutrophils Relative %: 70 %
Platelets: 304 10*3/uL (ref 150–400)
RBC: 3.67 MIL/uL — ABNORMAL LOW (ref 4.22–5.81)
RDW: 14.4 % (ref 11.5–15.5)
Smear Review: NORMAL
WBC: 30.5 10*3/uL — ABNORMAL HIGH (ref 4.0–10.5)
nRBC: 0.6 % — ABNORMAL HIGH (ref 0.0–0.2)

## 2021-09-18 MED ORDER — IOHEXOL 300 MG/ML  SOLN
100.0000 mL | Freq: Once | INTRAMUSCULAR | Status: AC | PRN
Start: 2021-09-18 — End: 2021-09-18
  Administered 2021-09-18: 100 mL via INTRAVENOUS

## 2021-09-18 MED ORDER — HEPARIN SOD (PORK) LOCK FLUSH 100 UNIT/ML IV SOLN
500.0000 [IU] | Freq: Once | INTRAVENOUS | Status: DC
  Filled 2021-09-18: qty 5

## 2021-09-18 MED ORDER — SODIUM CHLORIDE 0.9% FLUSH
10.0000 mL | INTRAVENOUS | Status: DC | PRN
  Administered 2021-09-18: 10 mL via INTRAVENOUS
  Filled 2021-09-18: qty 10

## 2021-09-18 MED ORDER — HEPARIN SOD (PORK) LOCK FLUSH 100 UNIT/ML IV SOLN
500.0000 [IU] | Freq: Once | INTRAVENOUS | Status: AC
  Administered 2021-09-18: 500 [IU] via INTRAVENOUS

## 2021-09-18 MED ORDER — HEPARIN SOD (PORK) LOCK FLUSH 100 UNIT/ML IV SOLN
INTRAVENOUS | Status: AC
  Filled 2021-09-18: qty 5

## 2021-09-18 MED ORDER — SODIUM CHLORIDE 0.9 % IV SOLN
INTRAVENOUS | Status: DC
  Filled 2021-09-18 (×2): qty 250

## 2021-09-18 MED ORDER — ONDANSETRON HCL 4 MG/2ML IJ SOLN
8.0000 mg | Freq: Once | INTRAMUSCULAR | Status: AC
  Administered 2021-09-18: 8 mg via INTRAVENOUS
  Filled 2021-09-18: qty 4

## 2021-09-18 NOTE — Progress Notes (Signed)
Pt port left accessed for radiology appt and scan today.  Instructed if pt needle was not deaccessed and flushed after scan to return to cancer center to have done.  Pt and wife verbalized understanding.

## 2021-09-21 MED FILL — Fosaprepitant Dimeglumine For IV Infusion 150 MG (Base Eq): INTRAVENOUS | Qty: 5 | Status: AC

## 2021-09-21 MED FILL — Dexamethasone Sodium Phosphate Inj 100 MG/10ML: INTRAMUSCULAR | Qty: 1 | Status: AC

## 2021-09-22 ENCOUNTER — Telehealth: Payer: Self-pay | Admitting: *Deleted

## 2021-09-22 ENCOUNTER — Inpatient Hospital Stay: Payer: Medicare HMO

## 2021-09-22 ENCOUNTER — Inpatient Hospital Stay: Payer: Medicare HMO | Admitting: Oncology

## 2021-09-22 ENCOUNTER — Other Ambulatory Visit: Payer: Self-pay | Admitting: Oncology

## 2021-09-22 MED FILL — Dexamethasone Sodium Phosphate Inj 100 MG/10ML: INTRAMUSCULAR | Qty: 1 | Status: AC

## 2021-09-22 NOTE — Telephone Encounter (Signed)
It will clash with tecentriq and ideally patients on immunotherapy should not be on steroids. So I would not recommend that

## 2021-09-22 NOTE — Telephone Encounter (Signed)
Pt's wife is asking if pt can continue on a small dose of a steroid since she feels like it helps him have energy during the day and keeps his appetite stable. Pt currently tapering off 4mg  dexamethasone and has a few tablets left.

## 2021-09-23 ENCOUNTER — Encounter: Payer: Self-pay | Admitting: Oncology

## 2021-09-23 ENCOUNTER — Other Ambulatory Visit: Payer: Self-pay | Admitting: Oncology

## 2021-09-23 ENCOUNTER — Other Ambulatory Visit: Payer: Self-pay

## 2021-09-23 ENCOUNTER — Inpatient Hospital Stay: Payer: Medicare HMO

## 2021-09-23 VITALS — BP 91/63 | HR 91 | Temp 98.6°F | Resp 16

## 2021-09-23 DIAGNOSIS — F17210 Nicotine dependence, cigarettes, uncomplicated: Secondary | ICD-10-CM | POA: Diagnosis not present

## 2021-09-23 DIAGNOSIS — C3431 Malignant neoplasm of lower lobe, right bronchus or lung: Secondary | ICD-10-CM | POA: Diagnosis not present

## 2021-09-23 DIAGNOSIS — C3490 Malignant neoplasm of unspecified part of unspecified bronchus or lung: Secondary | ICD-10-CM

## 2021-09-23 DIAGNOSIS — Z95828 Presence of other vascular implants and grafts: Secondary | ICD-10-CM

## 2021-09-23 DIAGNOSIS — Z5111 Encounter for antineoplastic chemotherapy: Secondary | ICD-10-CM | POA: Diagnosis not present

## 2021-09-23 DIAGNOSIS — C7970 Secondary malignant neoplasm of unspecified adrenal gland: Secondary | ICD-10-CM | POA: Diagnosis not present

## 2021-09-23 DIAGNOSIS — Z5112 Encounter for antineoplastic immunotherapy: Secondary | ICD-10-CM | POA: Diagnosis not present

## 2021-09-23 DIAGNOSIS — C786 Secondary malignant neoplasm of retroperitoneum and peritoneum: Secondary | ICD-10-CM | POA: Diagnosis not present

## 2021-09-23 DIAGNOSIS — C7931 Secondary malignant neoplasm of brain: Secondary | ICD-10-CM | POA: Diagnosis not present

## 2021-09-23 DIAGNOSIS — R110 Nausea: Secondary | ICD-10-CM | POA: Diagnosis not present

## 2021-09-23 DIAGNOSIS — D701 Agranulocytosis secondary to cancer chemotherapy: Secondary | ICD-10-CM | POA: Diagnosis not present

## 2021-09-23 LAB — BASIC METABOLIC PANEL
Anion gap: 8 (ref 5–15)
BUN: 13 mg/dL (ref 8–23)
CO2: 24 mmol/L (ref 22–32)
Calcium: 8.8 mg/dL — ABNORMAL LOW (ref 8.9–10.3)
Chloride: 101 mmol/L (ref 98–111)
Creatinine, Ser: 0.86 mg/dL (ref 0.61–1.24)
GFR, Estimated: >60 mL/min (ref >60)
Glucose, Bld: 102 mg/dL — ABNORMAL HIGH (ref 70–99)
Potassium: 3.7 mmol/L (ref 3.5–5.1)
Sodium: 133 mmol/L — ABNORMAL LOW (ref 135–145)

## 2021-09-23 MED ORDER — HEPARIN SOD (PORK) LOCK FLUSH 100 UNIT/ML IV SOLN
500.0000 [IU] | Freq: Once | INTRAVENOUS | Status: AC
  Administered 2021-09-23: 500 [IU] via INTRAVENOUS
  Filled 2021-09-23: qty 5

## 2021-09-23 MED ORDER — DEXAMETHASONE SODIUM PHOSPHATE 10 MG/ML IJ SOLN
8.0000 mg | Freq: Once | INTRAMUSCULAR | Status: AC
  Administered 2021-09-23: 8 mg via INTRAVENOUS

## 2021-09-23 MED ORDER — SODIUM CHLORIDE 0.9 % IV SOLN: 8.0000 mg | Freq: Once | INTRAVENOUS | Status: DC

## 2021-09-23 MED ORDER — SODIUM CHLORIDE 0.9 % IV SOLN
INTRAVENOUS | Status: DC
  Filled 2021-09-23 (×2): qty 250

## 2021-09-23 MED ORDER — SODIUM CHLORIDE 0.9% FLUSH
10.0000 mL | Freq: Once | INTRAVENOUS | Status: AC
  Administered 2021-09-23: 10 mL via INTRAVENOUS
  Filled 2021-09-23: qty 10

## 2021-09-23 NOTE — Telephone Encounter (Signed)
Pt's wife has been made aware and she verbalized understanding.

## 2021-09-24 ENCOUNTER — Ambulatory Visit: Payer: Medicare HMO

## 2021-09-24 ENCOUNTER — Ambulatory Visit: Payer: Medicare HMO | Admitting: Nurse Practitioner

## 2021-09-25 ENCOUNTER — Ambulatory Visit: Payer: Medicare HMO

## 2021-09-25 ENCOUNTER — Inpatient Hospital Stay: Payer: Medicare HMO

## 2021-09-25 DIAGNOSIS — R110 Nausea: Secondary | ICD-10-CM | POA: Diagnosis not present

## 2021-09-25 DIAGNOSIS — C7931 Secondary malignant neoplasm of brain: Secondary | ICD-10-CM | POA: Diagnosis not present

## 2021-09-25 DIAGNOSIS — F17210 Nicotine dependence, cigarettes, uncomplicated: Secondary | ICD-10-CM | POA: Diagnosis not present

## 2021-09-25 DIAGNOSIS — C3431 Malignant neoplasm of lower lobe, right bronchus or lung: Secondary | ICD-10-CM | POA: Diagnosis not present

## 2021-09-25 DIAGNOSIS — D701 Agranulocytosis secondary to cancer chemotherapy: Secondary | ICD-10-CM | POA: Diagnosis not present

## 2021-09-25 DIAGNOSIS — Z5112 Encounter for antineoplastic immunotherapy: Secondary | ICD-10-CM | POA: Diagnosis not present

## 2021-09-25 DIAGNOSIS — C786 Secondary malignant neoplasm of retroperitoneum and peritoneum: Secondary | ICD-10-CM | POA: Diagnosis not present

## 2021-09-25 DIAGNOSIS — C3490 Malignant neoplasm of unspecified part of unspecified bronchus or lung: Secondary | ICD-10-CM

## 2021-09-25 DIAGNOSIS — C7970 Secondary malignant neoplasm of unspecified adrenal gland: Secondary | ICD-10-CM | POA: Diagnosis not present

## 2021-09-25 DIAGNOSIS — Z5111 Encounter for antineoplastic chemotherapy: Secondary | ICD-10-CM | POA: Diagnosis not present

## 2021-09-25 DIAGNOSIS — Z95828 Presence of other vascular implants and grafts: Secondary | ICD-10-CM

## 2021-09-25 MED ORDER — ONDANSETRON HCL 4 MG/2ML IJ SOLN
8.0000 mg | Freq: Once | INTRAMUSCULAR | Status: AC
  Administered 2021-09-25: 8 mg via INTRAVENOUS
  Filled 2021-09-25: qty 4

## 2021-09-25 MED ORDER — SODIUM CHLORIDE 0.9% FLUSH
10.0000 mL | Freq: Once | INTRAVENOUS | Status: AC
  Administered 2021-09-25: 10 mL via INTRAVENOUS
  Filled 2021-09-25: qty 10

## 2021-09-25 MED ORDER — SODIUM CHLORIDE 0.9 % IV SOLN
INTRAVENOUS | Status: DC
  Filled 2021-09-25 (×2): qty 250

## 2021-09-25 MED ORDER — HEPARIN SOD (PORK) LOCK FLUSH 100 UNIT/ML IV SOLN
500.0000 [IU] | Freq: Once | INTRAVENOUS | Status: AC
  Administered 2021-09-25: 500 [IU] via INTRAVENOUS
  Filled 2021-09-25: qty 5

## 2021-09-25 MED FILL — Dexamethasone Sodium Phosphate Inj 100 MG/10ML: INTRAMUSCULAR | Qty: 1 | Status: AC

## 2021-09-25 MED FILL — Fosaprepitant Dimeglumine For IV Infusion 150 MG (Base Eq): INTRAVENOUS | Qty: 5 | Status: AC

## 2021-09-28 ENCOUNTER — Inpatient Hospital Stay: Payer: Medicare HMO

## 2021-09-28 ENCOUNTER — Other Ambulatory Visit: Payer: Self-pay | Admitting: Oncology

## 2021-09-28 ENCOUNTER — Inpatient Hospital Stay: Payer: Medicare HMO | Admitting: Oncology

## 2021-09-28 DIAGNOSIS — C3490 Malignant neoplasm of unspecified part of unspecified bronchus or lung: Secondary | ICD-10-CM

## 2021-09-29 ENCOUNTER — Inpatient Hospital Stay: Payer: Medicare HMO

## 2021-09-29 ENCOUNTER — Inpatient Hospital Stay (HOSPITAL_BASED_OUTPATIENT_CLINIC_OR_DEPARTMENT_OTHER): Payer: Medicare HMO | Admitting: Oncology

## 2021-09-29 ENCOUNTER — Encounter: Payer: Self-pay | Admitting: Oncology

## 2021-09-29 VITALS — BP 126/73 | HR 86 | Temp 98.6°F | Resp 18 | Wt 186.8 lb

## 2021-09-29 DIAGNOSIS — Z7189 Other specified counseling: Secondary | ICD-10-CM | POA: Diagnosis not present

## 2021-09-29 DIAGNOSIS — C3490 Malignant neoplasm of unspecified part of unspecified bronchus or lung: Secondary | ICD-10-CM

## 2021-09-29 DIAGNOSIS — Z5111 Encounter for antineoplastic chemotherapy: Secondary | ICD-10-CM

## 2021-09-29 DIAGNOSIS — R110 Nausea: Secondary | ICD-10-CM | POA: Diagnosis not present

## 2021-09-29 DIAGNOSIS — F17210 Nicotine dependence, cigarettes, uncomplicated: Secondary | ICD-10-CM | POA: Diagnosis not present

## 2021-09-29 DIAGNOSIS — C786 Secondary malignant neoplasm of retroperitoneum and peritoneum: Secondary | ICD-10-CM | POA: Diagnosis not present

## 2021-09-29 DIAGNOSIS — C7931 Secondary malignant neoplasm of brain: Secondary | ICD-10-CM | POA: Diagnosis not present

## 2021-09-29 DIAGNOSIS — D701 Agranulocytosis secondary to cancer chemotherapy: Secondary | ICD-10-CM | POA: Diagnosis not present

## 2021-09-29 DIAGNOSIS — C7970 Secondary malignant neoplasm of unspecified adrenal gland: Secondary | ICD-10-CM | POA: Diagnosis not present

## 2021-09-29 DIAGNOSIS — Z5112 Encounter for antineoplastic immunotherapy: Secondary | ICD-10-CM | POA: Diagnosis not present

## 2021-09-29 DIAGNOSIS — C3431 Malignant neoplasm of lower lobe, right bronchus or lung: Secondary | ICD-10-CM | POA: Diagnosis not present

## 2021-09-29 LAB — COMPREHENSIVE METABOLIC PANEL
ALT: 23 U/L (ref 0–44)
AST: 22 U/L (ref 15–41)
Albumin: 3.5 g/dL (ref 3.5–5.0)
Alkaline Phosphatase: 77 U/L (ref 38–126)
Anion gap: 6 (ref 5–15)
BUN: 11 mg/dL (ref 8–23)
CO2: 25 mmol/L (ref 22–32)
Calcium: 9.4 mg/dL (ref 8.9–10.3)
Chloride: 104 mmol/L (ref 98–111)
Creatinine, Ser: 0.83 mg/dL (ref 0.61–1.24)
GFR, Estimated: >60 mL/min (ref >60)
Glucose, Bld: 114 mg/dL — ABNORMAL HIGH (ref 70–99)
Potassium: 3.7 mmol/L (ref 3.5–5.1)
Sodium: 135 mmol/L (ref 135–145)
Total Bilirubin: 1.3 mg/dL — ABNORMAL HIGH (ref 0.3–1.2)
Total Protein: 7.8 g/dL (ref 6.5–8.1)

## 2021-09-29 LAB — CBC WITH DIFFERENTIAL/PLATELET
Abs Immature Granulocytes: 0.27 10*3/uL — ABNORMAL HIGH (ref 0.00–0.07)
Basophils Absolute: 0.2 10*3/uL — ABNORMAL HIGH (ref 0.0–0.1)
Basophils Relative: 1 %
Eosinophils Absolute: 0.1 10*3/uL (ref 0.0–0.5)
Eosinophils Relative: 0 %
HCT: 39.4 % (ref 39.0–52.0)
Hemoglobin: 13.4 g/dL (ref 13.0–17.0)
Immature Granulocytes: 2 %
Lymphocytes Relative: 17 %
Lymphs Abs: 2.8 10*3/uL (ref 0.7–4.0)
MCH: 31.8 pg (ref 26.0–34.0)
MCHC: 34 g/dL (ref 30.0–36.0)
MCV: 93.4 fL (ref 80.0–100.0)
Monocytes Absolute: 1.2 10*3/uL — ABNORMAL HIGH (ref 0.1–1.0)
Monocytes Relative: 7 %
Neutro Abs: 11.7 10*3/uL — ABNORMAL HIGH (ref 1.7–7.7)
Neutrophils Relative %: 73 %
Platelets: 348 10*3/uL (ref 150–400)
RBC: 4.22 MIL/uL (ref 4.22–5.81)
RDW: 16 % — ABNORMAL HIGH (ref 11.5–15.5)
WBC: 16.2 10*3/uL — ABNORMAL HIGH (ref 4.0–10.5)
nRBC: 0 % (ref 0.0–0.2)

## 2021-09-29 LAB — TSH: TSH: 1.984 u[IU]/mL (ref 0.350–4.500)

## 2021-09-29 MED ORDER — SODIUM CHLORIDE 0.9 % IV SOLN
220.0000 mg | Freq: Once | INTRAVENOUS | Status: AC
  Administered 2021-09-29: 220 mg via INTRAVENOUS
  Filled 2021-09-29: qty 11

## 2021-09-29 MED ORDER — SODIUM CHLORIDE 0.9 % IV SOLN
150.0000 mg | Freq: Once | INTRAVENOUS | Status: AC
  Administered 2021-09-29: 150 mg via INTRAVENOUS
  Filled 2021-09-29: qty 150

## 2021-09-29 MED ORDER — SODIUM CHLORIDE 0.9% FLUSH
10.0000 mL | INTRAVENOUS | Status: DC | PRN
  Administered 2021-09-29: 10 mL via INTRAVENOUS
  Filled 2021-09-29: qty 10

## 2021-09-29 MED ORDER — HEPARIN SOD (PORK) LOCK FLUSH 100 UNIT/ML IV SOLN
INTRAVENOUS | Status: AC
  Administered 2021-09-29: 500 [IU]
  Filled 2021-09-29: qty 5

## 2021-09-29 MED ORDER — SODIUM CHLORIDE 0.9 % IV SOLN
490.0000 mg | Freq: Once | INTRAVENOUS | Status: AC
  Administered 2021-09-29: 490 mg via INTRAVENOUS
  Filled 2021-09-29: qty 49

## 2021-09-29 MED ORDER — HEPARIN SOD (PORK) LOCK FLUSH 100 UNIT/ML IV SOLN
500.0000 [IU] | Freq: Once | INTRAVENOUS | Status: AC | PRN
  Filled 2021-09-29: qty 5

## 2021-09-29 MED ORDER — HEPARIN SOD (PORK) LOCK FLUSH 100 UNIT/ML IV SOLN
500.0000 [IU] | Freq: Once | INTRAVENOUS | Status: DC
  Filled 2021-09-29: qty 5

## 2021-09-29 MED ORDER — SODIUM CHLORIDE 0.9 % IV SOLN
10.0000 mg | Freq: Once | INTRAVENOUS | Status: AC
  Administered 2021-09-29: 10 mg via INTRAVENOUS
  Filled 2021-09-29: qty 10

## 2021-09-29 MED ORDER — SODIUM CHLORIDE 0.9 % IV SOLN
1200.0000 mg | Freq: Once | INTRAVENOUS | Status: AC
  Administered 2021-09-29: 1200 mg via INTRAVENOUS
  Filled 2021-09-29: qty 20

## 2021-09-29 MED ORDER — SODIUM CHLORIDE 0.9 % IV SOLN
Freq: Once | INTRAVENOUS | Status: AC
  Filled 2021-09-29: qty 250

## 2021-09-29 MED ORDER — PALONOSETRON HCL INJECTION 0.25 MG/5ML
0.2500 mg | Freq: Once | INTRAVENOUS | Status: AC
  Administered 2021-09-29: 0.25 mg via INTRAVENOUS
  Filled 2021-09-29: qty 5

## 2021-09-29 MED FILL — Dexamethasone Sodium Phosphate Inj 100 MG/10ML: INTRAMUSCULAR | Qty: 1 | Status: AC

## 2021-09-29 NOTE — Progress Notes (Signed)
Pt states he will like to have his rt ear looked at; states he can not hear; had an infection recently. Wife states she will like to discuss if he can have anything for his bladder going to restroom multiple times;wetting bedtime usually occurs when having fluids or after txs.

## 2021-09-29 NOTE — Patient Instructions (Signed)
[facility] CANCER CTR AT [place]  Discharge Instructions: Thank you for choosing [facility] to provide your oncology and hematology care.  If you have a lab appointment with the [place], please go directly to the [facility] and check in at the registration area.  Wear comfortable clothing and clothing appropriate for easy access to any Portacath or PICC line.   We strive to give you quality time with your provider. You may need to reschedule your appointment if you arrive late (15 or more minutes).  Arriving late affects you and other patients whose appointments are after yours.  Also, if you miss three or more appointments without notifying the office, you may be dismissed from the clinic at the provider's discretion.      For prescription refill requests, have your pharmacy contact our office and allow 72 hours for refills to be completed.    Today you received the following chemotherapy and/or immunotherapy agents Tecentriq, Etoposide and Carboplatin       To help prevent nausea and vomiting after your treatment, we encourage you to take your nausea medication as directed.  BELOW ARE SYMPTOMS THAT SHOULD BE REPORTED IMMEDIATELY: *FEVER GREATER THAN 100.4 F (38 C) OR HIGHER *CHILLS OR SWEATING *NAUSEA AND VOMITING THAT IS NOT CONTROLLED WITH YOUR NAUSEA MEDICATION *UNUSUAL SHORTNESS OF BREATH *UNUSUAL BRUISING OR BLEEDING *URINARY PROBLEMS (pain or burning when urinating, or frequent urination) *BOWEL PROBLEMS (unusual diarrhea, constipation, pain near the anus) TENDERNESS IN MOUTH AND THROAT WITH OR WITHOUT PRESENCE OF ULCERS (sore throat, sores in mouth, or a toothache) UNUSUAL RASH, SWELLING OR PAIN  UNUSUAL VAGINAL DISCHARGE OR ITCHING   Items with * indicate a potential emergency and should be followed up as soon as possible or go to the Emergency Department if any problems should occur.  Please show the CHEMOTHERAPY ALERT CARD or IMMUNOTHERAPY  ALERT CARD at check-in to the Emergency Department and triage nurse.  Should you have questions after your visit or need to cancel or reschedule your appointment, please contact [facility] CANCER [place] AT [place]  [phone] and follow the prompts.  Office hours are 8:00 a.m. to 4:30 p.m. Monday - Friday. Please note that voicemails left after 4:00 p.m. may not be returned until the following business day.  We are closed weekends and major holidays. You have access to a nurse at all times for urgent questions. Please call the main number to the clinic [phone] and follow the prompts.  For any non-urgent questions, you may also contact your provider using MyChart. We now offer e-Visits for anyone 66 and older to request care online for non-urgent symptoms. For details visit [email or web address].   Also download the MyChart app! Go to the app store, search "MyChart", open the app, select [place], and log in with your MyChart username and password.  Masks are optional in the cancer centers. If you would like for your care team to wear a mask while they are taking care of you, please let them know. For doctor visits, patients may have with them one support person who is at least 66 years old. At this time, visitors are not allowed in the infusion area.

## 2021-09-29 NOTE — Progress Notes (Signed)
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  He had a lung cancer screening protocol on 05/29/2021 which showed a right lower lobe lung mass with concern for bilateral adrenal masses.  This was followed by a PET CT scan on 06/15/2021 which showed a right lower lobe soft tissue mass with an SUV uptake of 16.6.  No hypermetabolic hilar or mediastinal adenopathy.  Bilateral hypermetabolic adrenal masses 6.7 and 5.1 cm respectively.  2.8 cm soft tissue lesion in the body of the pancreas and 1.9 cm soft tissue lesion in the tail of the pancreas.  2.3 cm retroperitoneal soft tissue nodule.  10 cm x 6.4 cm soft tissue mass inferior to the transverse colon in the midline.  4.1 x 2.4 cm right mesenteric soft tissue mass with an SUV of 11.7.   MRI brain was significant for multiple areas of intracranial metastatic disease.  Largest lesion in the left frontal lobe with significant edema internal hemorrhage and is causing a mass effect with 6 mm midline shift.  30 metastatic foci seen in the brain.  Patient is undergoing whole brain radiation for the same.     Abdominal mass biopsy showed high-grade carcinoma compatible with metastatic small cell carcinoma of lung origin.  Interval history-reports ongoing fatigue.  He still has feeling of fullness in his right ear and occasional dizziness.  ECOG  PS- 2 Pain scale- 0   Review of systems- Review of Systems  Constitutional:  Positive for malaise/fatigue. Negative for chills, fever and weight loss.  HENT:  Negative for congestion, ear discharge and nosebleeds.   Eyes:  Negative for blurred vision.  Respiratory:  Negative for cough, hemoptysis, sputum production, shortness of breath and wheezing.   Cardiovascular:  Negative for chest pain, palpitations, orthopnea and claudication.  Gastrointestinal:  Negative for abdominal pain, blood in stool, constipation, diarrhea, heartburn, melena, nausea and vomiting.  Genitourinary:  Negative for dysuria, flank pain, frequency, hematuria and urgency.  Musculoskeletal:  Negative for back pain, joint pain and myalgias.  Skin:  Negative for rash.  Neurological:  Negative for dizziness, tingling, focal weakness, seizures, weakness and headaches.  Endo/Heme/Allergies:  Does not bruise/bleed easily.  Psychiatric/Behavioral:  Negative for depression and suicidal ideas. The patient does not have insomnia.       Allergies  Allergen Reactions   Bee Venom      Past Medical History:  Diagnosis Date   Cancer ([place])    COPD (chronic obstructive pulmonary disease) ([place])    Dyspnea      Past Surgical History:  Procedure Laterality Date   CATARACT EXTRACTION Right 03/10/2021   IR IMAGING GUIDED PORT INSERTION  07/27/2021    Social History   Socioeconomic History   Marital status: Married    Spouse name: Not on file   Number of children: Not on file   Years of education: Not on file   Highest education level: Not on file  Occupational History   Not on file  Tobacco Use   Smoking status: Every Day    Packs/day: 2.00    Years: 51.00    Total pack years: 102.00    Types: Cigarettes   Smokeless tobacco: Never  Vaping Use   Vaping Use: Never used  Substance and Sexual Activity   Alcohol use: Not Currently   Drug use: Never   Sexual activity: Yes  Other Topics Concern   Not on file   Social History Narrative   Not on file   Social Determinants of Health   Financial Resource Strain: Low Risk  (07/13/2021)   Overall Financial Resource Strain (CARDIA)    Difficulty of Paying Living Expenses: Not hard at all  Food Insecurity: No Food Insecurity (07/13/2021)   Hunger Vital Sign    Worried About Running Out of Food in the Last Year: Never true    Ran Out of Food in the Last Year: Never true  Transportation Needs: No Transportation Needs (07/13/2021)   PRAPARE - Hydrologist (Medical): No    Lack of Transportation (Non-Medical): No  Physical Activity: Inactive (07/20/2019)   Exercise Vital Sign    Days of Exercise per Week: 0 days    Minutes of Exercise per Session: 0 min  Stress: Stress Concern Present (07/13/2021)   [place]    Feeling of Stress : To some extent  Social Connections: Moderately Isolated (07/13/2021)   Social Connection and Isolation Panel [NHANES]    Frequency of Communication with Friends and Family: More than three times a week    Frequency of Social Gatherings with Friends and Family: Twice a week    Attends Religious Services: Never    Marine scientist or Organizations: No    Attends Archivist Meetings: Never    Marital Status: Married  Human resources officer Violence: Not At Risk (07/13/2021)   Humiliation, Afraid, Rape, and Kick questionnaire    Fear of Current or Ex-Partner: No    Emotionally Abused: No    Physically Abused: No    Sexually Abused: No    Family History  Problem Relation Age of Onset   Diabetes Mother    Hypertension Mother    Diabetes Father    Hypertension Father    Diabetes Sister    Cancer - Colon Cousin      Current Outpatient Medications:    albuterol (PROVENTIL) (2.5 MG/3ML) 0.083% nebulizer solution, Take 3 mLs (2.5 mg total) by nebulization every 6 (six) hours as needed for wheezing or shortness of breath.,  Disp: 75 mL, Rfl: 12   budesonide (PULMICORT) 0.25 MG/2ML nebulizer solution, Take 2 mLs (0.25 mg total) by nebulization 2 (two) times daily as needed., Disp: 60 mL, Rfl: 12   clotrimazole (LOTRIMIN) 1 % external solution, SMARTSIG:3-4 Drop(s) In Ear(s) Twice Daily, Disp: , Rfl:    dexamethasone (DECADRON) 4 MG tablet, Take 1 tablet (4 mg total) by mouth daily., Disp: 5 tablet, Rfl: 0   dronabinol (MARINOL) 5 MG capsule, Take 1 capsule (5 mg total) by mouth 2 (two) times daily before lunch and supper., Disp: 30 capsule, Rfl: 0   NEOMYCIN-POLYMYXIN-HYDROCORTISONE (CORTISPORIN) 1 % SOLN OTIC solution, SMARTSIG:3-4 Drop(s) In Ear(s) Twice Daily, Disp: , Rfl:    nystatin (MYCOSTATIN) 100000 UNIT/ML suspension, Take 5 mLs (500,000 Units total) by mouth 3 (three) times daily., Disp: 180 mL, Rfl: 0   OLANZapine (ZYPREXA) 10 MG tablet,  Take 1 tablet (10 mg total) by mouth at bedtime., Disp: 30 tablet, Rfl: 0   ondansetron (ZOFRAN) 4 MG tablet, Take 1 tablet (4 mg total) by mouth every 8 (eight) hours as needed for nausea or vomiting., Disp: 60 tablet, Rfl: 3   oxyCODONE (OXY IR/ROXICODONE) 5 MG immediate release tablet, Take 1 tablet (5 mg total) by mouth every 4 (four) hours as needed for severe pain., Disp: 60 tablet, Rfl: 0   pantoprazole (PROTONIX) 20 MG tablet, TAKE 1 TABLET BY MOUTH EVERY DAY, Disp: 90 tablet, Rfl: 0   prochlorperazine (COMPAZINE) 10 MG tablet, Take 1 tablet (10 mg total) by mouth every 6 (six) hours as needed for nausea or vomiting., Disp: 60 tablet, Rfl: 2   rosuvastatin (CRESTOR) 10 MG tablet, Take 10 mg by mouth daily., Disp: , Rfl:    valACYclovir (VALTREX) 1000 MG tablet, Take 1 tablet (1,000 mg total) by mouth daily., Disp: 10 tablet, Rfl: 10   potassium chloride SA (KLOR-CON M) 20 MEQ tablet, Take 1 tablet (20 mEq total) by mouth 2 (two) times daily for 7 days., Disp: 14 tablet, Rfl: 0   traZODone (DESYREL) 50 MG tablet, TAKE 1 TABLET BY MOUTH AT BEDTIME AS NEEDED FOR SLEEP.  (Patient not taking: Reported on 09/01/2021), Disp: 90 tablet, Rfl: 1 No current facility-administered medications for this visit.  Facility-Administered Medications Ordered in Other Visits:    CARBOplatin (PARAPLATIN) 490 mg in sodium chloride 0.9 % 250 mL chemo infusion, 490 mg, Intravenous, Once, Randa Evens C, MD   heparin lock flush 100 UNIT/ML injection, , , ,    heparin lock flush 100 unit/mL, 500 Units, Intracatheter, Once PRN, Sindy Guadeloupe, MD  Physical exam:  Vitals:   09/29/21 0839  BP: 126/73  Pulse: 86  Resp: 18  Temp: 98.6 F (37 C)  SpO2: 97%  Weight: 186 lb 12.8 oz (84.7 kg)   Physical Exam HENT:     Ears:     Comments: There is slough noted in the right external ear.  No frank inflammation.  Right tympanic membrane appears ruptured Cardiovascular:     Rate and Rhythm: Normal rate and regular rhythm.     Heart sounds: Normal heart sounds.  Pulmonary:     Effort: Pulmonary effort is normal.     Breath sounds: Normal breath sounds.  Abdominal:     General: Bowel sounds are normal.     Palpations: Abdomen is soft.  Skin:    General: Skin is warm and dry.  Neurological:     Mental Status: He is alert and oriented to person, place, and time.         Latest Ref Rng & Units 09/29/2021    8:20 AM  CMP  Glucose 70 - 99 mg/dL 114   BUN 8 - 23 mg/dL 11   Creatinine 0.61 - 1.24 mg/dL 0.83   Sodium 135 - 145 mmol/L 135   Potassium 3.5 - 5.1 mmol/L 3.7   Chloride 98 - 111 mmol/L 104   CO2 22 - 32 mmol/L 25   Calcium 8.9 - 10.3 mg/dL 9.4   Total Protein 6.5 - 8.1 g/dL 7.8   Total Bilirubin 0.3 - 1.2 mg/dL 1.3   Alkaline Phos 38 - 126 U/L 77   AST 15 - 41 U/L 22   ALT 0 - 44 U/L 23       Latest Ref Rng & Units 09/29/2021    8:20 AM  CBC  WBC 4.0 - 10.5  K/uL 16.2   Hemoglobin 13.0 - 17.0 g/dL 13.4   Hematocrit 39.0 - 52.0 % 39.4   Platelets 150 - 400 K/uL 348     No images are attached to the encounter.  CT CHEST ABDOMEN PELVIS W CONTRAST  Result  Date: 09/21/2021 CLINICAL DATA:  Lung cancer restaging, ongoing chemotherapy * Tracking Code: BO * EXAM: CT CHEST, ABDOMEN, AND PELVIS WITH CONTRAST TECHNIQUE: Multidetector CT imaging of the chest, abdomen and pelvis was performed following the standard protocol during bolus administration of intravenous contrast. RADIATION DOSE REDUCTION: This exam was performed according to the departmental dose-optimization program which includes automated exposure control, adjustment of the mA and/or kV according to patient size and/or use of iterative reconstruction technique. CONTRAST:  154mL OMNIPAQUE IOHEXOL 300 MG/ML SOLN additional oral enteric contrast COMPARISON:  CT chest angiogram, 08/07/2021, PET-CT, 06/15/2021 FINDINGS: CT CHEST FINDINGS Cardiovascular: Right chest port catheter. Aortic atherosclerosis. Normal heart size. Three-vessel coronary artery calcifications. No pericardial effusion. Mediastinum/Nodes: No enlarged mediastinal, hilar, or axillary lymph nodes. Thyroid gland, trachea, and esophagus demonstrate no significant findings. Lungs/Pleura: Mild predominantly centrilobular emphysema and diffuse bilateral bronchial wall thickening. Background of extensive, fine centrilobular nodularity, most concentrated in the lung apices. New, rounded nodular opacity in the right apex measuring 2.5 x 2.4 cm (series 4, image 26). Interval decrease in size of a previously FDG avid infrahilar mass of the superior segment right lower lobe measuring 3.2 x 2.7 cm, previously 3.8 x 3.4 cm when measured similarly on prior PET-CT (series 4, image 96). Bronchial plugging ground-glass and heterogeneous airspace opacity, and bandlike scarring or atelectasis of the dependent right lower lobe distal to this mass (series 4, image 122). No pleural effusion or pneumothorax. Musculoskeletal: No chest wall abnormality. No acute osseous findings. CT ABDOMEN PELVIS FINDINGS Hepatobiliary: No solid liver abnormality is seen. Small gallstone  near the gallbladder neck. No gallbladder wall thickening, or biliary dilatation. Pancreas: Unremarkable. No pancreatic ductal dilatation or surrounding inflammatory changes. Spleen: Normal in size without significant abnormality. Adrenals/Urinary Tract: Decrease in size and solid character of necrotic adrenal masses, on the right measuring 5.7 x 4.3 cm and with only a thin rim of peripheral soft tissue remaining, previously 7.4 x 6.4 cm (series 2, image 64) and a heterogeneous mass on the left measuring 3.5 x 2.9 cm, previously 5.5 x 5.0 cm (series 2, image 63). Kidneys are normal, without renal calculi, solid lesion, or hydronephrosis. Small diverticulum of the left aspect of the bladder. Stomach/Bowel: Stomach is within normal limits. Appendix appears normal. No evidence of bowel wall thickening, distention, or inflammatory changes. Descending and sigmoid diverticulosis. Vascular/Lymphatic: Aortic atherosclerosis. No enlarged abdominal or pelvic lymph nodes. Reproductive: No mass or other abnormality. Other: No abdominal wall hernia or abnormality. No ascites. Marked interval decrease in size of in omental mass in the ventral right hemiabdomen, measuring 4.9 x 2.6 cm, previously 10.1 x 6.4 cm (series 2, image 88). Interval decrease in size of a mesenteric mass in the right lower quadrant measuring 3.1 x 1.9 cm, previously 4.1 x 2.4 cm (series 2, image 91). Musculoskeletal: No acute osseous findings. IMPRESSION: 1. Interval decrease in size of a previously FDG avid infrahilar mass of the superior segment right lower lobe. 2. Interval decrease in size and solid character of necrotic adrenal masses. 3. Interval decrease in size of omental and mesenteric masses. 4. Previously described hypermetabolic pancreatic nodules are no longer apparent by CT. 5. Above constellation of findings is consistent with treatment response. 6. New, rounded  nodular opacity in the right apex measuring 2.5 x 2.4 cm. Given rapid interval  development from most recent prior dated 08/07/2021 this finding is most consistent with infection or inflammation, however metastatic disease is not strictly excluded. Attention on follow-up. 7. Emphysema. 8. Coronary artery disease. Aortic Atherosclerosis (ICD10-I70.0). Electronically Signed   By: Delanna Ahmadi M.D.   On: 09/21/2021 08:04     Assessment and plan- Patient is a 66 y.o. male with extensive stage small cell lung cancer here for on treatment assessment prior to cycle 3 of carbo etoposide Tecentriq chemotherapy  Counts okay to proceed with cycle 3 of carbo etoposide Tecentriq chemotherapy.  I will see him back in 3 weeks for cycle 4.  I will plan to repeat scans again after 4 cycles.  He will also received Udenyca on day 4.  IV fluids on day 4.  Have reviewed CT chest abdomen pelvis images independently and discussed findings with the patient which overall shows response to treatment and decrease in the size of lung mass as well as adrenal omental and peritoneal metastases.  She will do 2 more cycles of chemotherapy and if he continues to show response I will switch him to maintenance Tecentriq at that time.  Patient still has evidence of slough in his right ear and I am concerned that his right tympanic membrane also appears ruptured.  Symptomatically patient is better than when he was diagnosed with fungal infection of his right ear but I would still want him to follow-up with ENT at this time   Visit Diagnosis 1. Encounter for antineoplastic chemotherapy   2. Small cell lung cancer ([place])   3. Goals of care, counseling/discussion      Dr. Randa Evens, MD, MPH [facility] [phone] 09/29/2021 12:03 PM

## 2021-09-30 ENCOUNTER — Inpatient Hospital Stay: Payer: Medicare HMO

## 2021-09-30 VITALS — BP 117/69 | HR 67 | Resp 18

## 2021-09-30 DIAGNOSIS — C3431 Malignant neoplasm of lower lobe, right bronchus or lung: Secondary | ICD-10-CM | POA: Diagnosis not present

## 2021-09-30 DIAGNOSIS — C7931 Secondary malignant neoplasm of brain: Secondary | ICD-10-CM | POA: Diagnosis not present

## 2021-09-30 DIAGNOSIS — D701 Agranulocytosis secondary to cancer chemotherapy: Secondary | ICD-10-CM | POA: Diagnosis not present

## 2021-09-30 DIAGNOSIS — C786 Secondary malignant neoplasm of retroperitoneum and peritoneum: Secondary | ICD-10-CM | POA: Diagnosis not present

## 2021-09-30 DIAGNOSIS — R110 Nausea: Secondary | ICD-10-CM | POA: Diagnosis not present

## 2021-09-30 DIAGNOSIS — Z5111 Encounter for antineoplastic chemotherapy: Secondary | ICD-10-CM | POA: Diagnosis not present

## 2021-09-30 DIAGNOSIS — F17210 Nicotine dependence, cigarettes, uncomplicated: Secondary | ICD-10-CM | POA: Diagnosis not present

## 2021-09-30 DIAGNOSIS — Z5112 Encounter for antineoplastic immunotherapy: Secondary | ICD-10-CM | POA: Diagnosis not present

## 2021-09-30 DIAGNOSIS — C7970 Secondary malignant neoplasm of unspecified adrenal gland: Secondary | ICD-10-CM | POA: Diagnosis not present

## 2021-09-30 DIAGNOSIS — C3490 Malignant neoplasm of unspecified part of unspecified bronchus or lung: Secondary | ICD-10-CM

## 2021-09-30 MED ORDER — SODIUM CHLORIDE 0.9 % IV SOLN
Freq: Once | INTRAVENOUS | Status: AC
  Filled 2021-09-30: qty 250

## 2021-09-30 MED ORDER — HEPARIN SOD (PORK) LOCK FLUSH 100 UNIT/ML IV SOLN
500.0000 [IU] | Freq: Once | INTRAVENOUS | Status: AC | PRN
  Administered 2021-09-30: 500 [IU]
  Filled 2021-09-30: qty 5

## 2021-09-30 MED ORDER — SODIUM CHLORIDE 0.9 % IV SOLN
220.0000 mg | Freq: Once | INTRAVENOUS | Status: AC
  Administered 2021-09-30: 220 mg via INTRAVENOUS
  Filled 2021-09-30: qty 11

## 2021-09-30 MED ORDER — SODIUM CHLORIDE 0.9 % IV SOLN
10.0000 mg | Freq: Once | INTRAVENOUS | Status: AC
  Administered 2021-09-30: 10 mg via INTRAVENOUS
  Filled 2021-09-30: qty 10
  Filled 2021-09-30: qty 1

## 2021-09-30 MED FILL — Dexamethasone Sodium Phosphate Inj 100 MG/10ML: INTRAMUSCULAR | Qty: 1 | Status: AC

## 2021-09-30 NOTE — Patient Instructions (Signed)
[facility] [place]  Discharge Instructions: Thank you for choosing [facility] to provide your oncology and hematology care.  If you have a lab appointment with the Cancer Center, please go directly to the Cancer Center and check in at the registration area.  Wear comfortable clothing and clothing appropriate for easy access to any Portacath or PICC line.   We strive to give you quality time with your provider. You may need to reschedule your appointment if you arrive late (15 or more minutes).  Arriving late affects you and other patients whose appointments are after yours.  Also, if you miss three or more appointments without notifying the office, you may be dismissed from the clinic at the provider's discretion.      For prescription refill requests, have your pharmacy contact our office and allow 72 hours for refills to be completed.       To help prevent nausea and vomiting after your treatment, we encourage you to take your nausea medication as directed.  BELOW ARE SYMPTOMS THAT SHOULD BE REPORTED IMMEDIATELY: *FEVER GREATER THAN 100.4 F (38 C) OR HIGHER *CHILLS OR SWEATING *NAUSEA AND VOMITING THAT IS NOT CONTROLLED WITH YOUR NAUSEA MEDICATION *UNUSUAL SHORTNESS OF BREATH *UNUSUAL BRUISING OR BLEEDING *URINARY PROBLEMS (pain or burning when urinating, or frequent urination) *BOWEL PROBLEMS (unusual diarrhea, constipation, pain near the anus) TENDERNESS IN MOUTH AND THROAT WITH OR WITHOUT PRESENCE OF ULCERS (sore throat, sores in mouth, or a toothache) UNUSUAL RASH, SWELLING OR PAIN  UNUSUAL VAGINAL DISCHARGE OR ITCHING   Items with * indicate a potential emergency and should be followed up as soon as possible or go to the Emergency Department if any problems should occur.  Please show the CHEMOTHERAPY ALERT CARD or IMMUNOTHERAPY ALERT CARD at check-in to the Emergency Department and triage nurse.  Should you have questions after your  visit or need to cancel or reschedule your appointment, please contact [facility] [place]  [phone] and follow the prompts.  Office hours are 8:00 a.m. to 4:30 p.m. Monday - Friday. Please note that voicemails left after 4:00 p.m. may not be returned until the following business day.  We are closed weekends and major holidays. You have access to a nurse at all times for urgent questions. Please call the main number to the clinic [phone] and follow the prompts.  For any non-urgent questions, you may also contact your provider using MyChart. We now offer e-Visits for anyone 18 and older to request care online for non-urgent symptoms. For details visit [email or web address].   Also download the MyChart app! Go to the app store, search "MyChart", open the app, select [place], and log in with your MyChart username and password.  Masks are optional in the cancer centers. If you would like for your care team to wear a mask while they are taking care of you, please let them know. For doctor visits, patients may have with them one support person who is at least 66 years old. At this time, visitors are not allowed in the infusion area.   

## 2021-10-01 ENCOUNTER — Ambulatory Visit: Payer: Medicare HMO

## 2021-10-01 ENCOUNTER — Inpatient Hospital Stay (HOSPITAL_BASED_OUTPATIENT_CLINIC_OR_DEPARTMENT_OTHER): Payer: Medicare HMO | Admitting: Nurse Practitioner

## 2021-10-01 ENCOUNTER — Inpatient Hospital Stay: Payer: Medicare HMO

## 2021-10-01 VITALS — BP 108/73 | HR 68 | Temp 97.9°F | Resp 18

## 2021-10-01 DIAGNOSIS — Z5111 Encounter for antineoplastic chemotherapy: Secondary | ICD-10-CM | POA: Diagnosis not present

## 2021-10-01 DIAGNOSIS — C7970 Secondary malignant neoplasm of unspecified adrenal gland: Secondary | ICD-10-CM | POA: Diagnosis not present

## 2021-10-01 DIAGNOSIS — F17210 Nicotine dependence, cigarettes, uncomplicated: Secondary | ICD-10-CM

## 2021-10-01 DIAGNOSIS — C3490 Malignant neoplasm of unspecified part of unspecified bronchus or lung: Secondary | ICD-10-CM

## 2021-10-01 DIAGNOSIS — R110 Nausea: Secondary | ICD-10-CM | POA: Diagnosis not present

## 2021-10-01 DIAGNOSIS — C3431 Malignant neoplasm of lower lobe, right bronchus or lung: Secondary | ICD-10-CM | POA: Diagnosis not present

## 2021-10-01 DIAGNOSIS — Z5112 Encounter for antineoplastic immunotherapy: Secondary | ICD-10-CM | POA: Diagnosis not present

## 2021-10-01 DIAGNOSIS — C786 Secondary malignant neoplasm of retroperitoneum and peritoneum: Secondary | ICD-10-CM | POA: Diagnosis not present

## 2021-10-01 DIAGNOSIS — D701 Agranulocytosis secondary to cancer chemotherapy: Secondary | ICD-10-CM | POA: Diagnosis not present

## 2021-10-01 DIAGNOSIS — C7931 Secondary malignant neoplasm of brain: Secondary | ICD-10-CM | POA: Diagnosis not present

## 2021-10-01 LAB — T4: T4, Total: 8 ug/dL (ref 4.5–12.0)

## 2021-10-01 MED ORDER — HEPARIN SOD (PORK) LOCK FLUSH 100 UNIT/ML IV SOLN
500.0000 [IU] | Freq: Once | INTRAVENOUS | Status: AC | PRN
  Filled 2021-10-01: qty 5

## 2021-10-01 MED ORDER — VARENICLINE TARTRATE 0.5 MG PO TABS: ORAL_TABLET | ORAL | 0 refills | Status: DC

## 2021-10-01 MED ORDER — HEPARIN SOD (PORK) LOCK FLUSH 100 UNIT/ML IV SOLN
INTRAVENOUS | Status: AC
  Administered 2021-10-01: 500 [IU]
  Filled 2021-10-01: qty 5

## 2021-10-01 MED ORDER — SODIUM CHLORIDE 0.9 % IV SOLN
Freq: Once | INTRAVENOUS | Status: AC
  Filled 2021-10-01: qty 250

## 2021-10-01 MED ORDER — SODIUM CHLORIDE 0.9 % IV SOLN
10.0000 mg | Freq: Once | INTRAVENOUS | Status: AC
  Administered 2021-10-01: 10 mg via INTRAVENOUS
  Filled 2021-10-01: qty 10

## 2021-10-01 MED ORDER — SODIUM CHLORIDE 0.9 % IV SOLN
220.0000 mg | Freq: Once | INTRAVENOUS | Status: AC
  Administered 2021-10-01: 220 mg via INTRAVENOUS
  Filled 2021-10-01: qty 11

## 2021-10-01 NOTE — Progress Notes (Signed)
Maurice Simpson mentioned today that his right ear is draining clear, thin fluid from his right ear. He's having some dizziness without headaches. Vital signs stable. Dr. Janese Banks aware. Patient has a follow up appointment with ENT on Monday. Per Dr. Janese Banks, proceed with Etoposide and inform patient to call ENT office to ask if he can be seen tomorrow. Patient verbalized understanding.

## 2021-10-01 NOTE — Patient Instructions (Signed)
[facility] AT [place]  Discharge Instructions: Thank you for choosing [facility] to provide your oncology and hematology care.  If you have a lab appointment with the [place], please go directly to the [place] and check in at the registration area.  Wear comfortable clothing and clothing appropriate for easy access to any Portacath or PICC line.   We strive to give you quality time with your provider. You may need to reschedule your appointment if you arrive late (15 or more minutes).  Arriving late affects you and other patients whose appointments are after yours.  Also, if you miss three or more appointments without notifying the office, you may be dismissed from the clinic at the provider's discretion.      For prescription refill requests, have your pharmacy contact our office and allow 72 hours for refills to be completed.    Today you received the following chemotherapy and/or immunotherapy agents Etoposide      To help prevent nausea and vomiting after your treatment, we encourage you to take your nausea medication as directed.  BELOW ARE SYMPTOMS THAT SHOULD BE REPORTED IMMEDIATELY: *FEVER GREATER THAN 100.4 F (38 C) OR HIGHER *CHILLS OR SWEATING *NAUSEA AND VOMITING THAT IS NOT CONTROLLED WITH YOUR NAUSEA MEDICATION *UNUSUAL SHORTNESS OF BREATH *UNUSUAL BRUISING OR BLEEDING *URINARY PROBLEMS (pain or burning when urinating, or frequent urination) *BOWEL PROBLEMS (unusual diarrhea, constipation, pain near the anus) TENDERNESS IN MOUTH AND THROAT WITH OR WITHOUT PRESENCE OF ULCERS (sore throat, sores in mouth, or a toothache) UNUSUAL RASH, SWELLING OR PAIN  UNUSUAL VAGINAL DISCHARGE OR ITCHING   Items with * indicate a potential emergency and should be followed up as soon as possible or go to the Emergency Department if any problems should occur.  Please show the CHEMOTHERAPY ALERT CARD or IMMUNOTHERAPY ALERT CARD at check-in to  the Emergency Department and triage nurse.  Should you have questions after your visit or need to cancel or reschedule your appointment, please contact [facility] [place] AT [place]  [phone] and follow the prompts.  Office hours are 8:00 a.m. to 4:30 p.m. Monday - Friday. Please note that voicemails left after 4:00 p.m. may not be returned until the following business day.  We are closed weekends and major holidays. You have access to a nurse at all times for urgent questions. Please call the main number to the clinic [phone] and follow the prompts.  For any non-urgent questions, you may also contact your provider using MyChart. We now offer e-Visits for anyone 43 and older to request care online for non-urgent symptoms. For details visit [email or web address].   Also download the MyChart app! Go to the app store, search "MyChart", open the app, select [place], and log in with your MyChart username and password.  Masks are optional in the cancer centers. If you would like for your care team to wear a mask while they are taking care of you, please let them know. For doctor visits, patients may have with them one support person who is at least 66 years old. At this time, visitors are not allowed in the infusion area.

## 2021-10-01 NOTE — Progress Notes (Signed)
Smoking Cessation [place]  [phone] [phone]  Patient Care Team: [name], NP as PCP - General (Nurse Practitioner) Telford Nab, RN as Oncology Nurse Navigator   Name of the patient: Maurice Simpson  [id number]  1955/01/15   Date of visit: 10/01/2021   Diagnosis-  Lung Cancer; Current smoker  Chief complaint/ Reason for visit- Smoking Cessation counseling  PMH-  Past Medical History:  Diagnosis Date   Cancer ([place])    COPD (chronic obstructive pulmonary disease) ([place])    Dyspnea    Patient Active Problem List   Diagnosis Date Noted   Encounter for antineoplastic chemotherapy 08/25/2021   Lung cancer metastatic to brain ([place]) 08/25/2021   Thrush 08/25/2021   Palliative care encounter    Febrile neutropenia ([place]) 08/12/2021   Thrombocytopenia ([place]) 08/12/2021   Small cell lung cancer ([place]) 07/21/2021   Weight loss, abnormal 06/24/2021   Cardiovascular risk factor 05/12/2021   Bee sting allergy 05/12/2021   Obesity 07/20/2019   Nicotine dependence, cigarettes, uncomplicated 26/20/3559   Family history of colon cancer 07/20/2019   COPD (chronic obstructive pulmonary disease) ([place]) 07/20/2019   Preventative health care 07/20/2019    Interval history- Ricahrd Simpson is a 66  yo patient who presents to smoking cessation clinic to discuss strategies and techniques to quit smoking.  The goal of this program is to implement proven ineffective interventions in a clinical setting to successfully quit smoking.   During our visit today, we will discuss strategies for tobacco cessation.   Smoking history: Tobacco Use: High Risk (09/29/2021)   Patient History    Smoking Tobacco Use: Every Day    Smokeless Tobacco Use: Never    Passive Exposure: Not on file    Type of tobacco:   Cigarettes  Approximate date of last quit attempt: unsure   Longest period of time quit in the past: none   What caused the relapse:  n/a  Medication used in the past: none  Readiness to quit: would like to quit now or soon  Other smokers in household: no  Plan: Quit date: undetermined.   Today, he is receiving chemotherapy for lung cancer. His wife accompanies him for visit. He is eager to quit, says he knows he needs to. Hasn't tried quitting recently.   Review of systems- Review of Systems  Constitutional:  Positive for malaise/fatigue.  Respiratory:  Positive for cough.   Neurological:  Positive for weakness.      Allergies  Allergen Reactions   Bee Venom      Past Medical History:  Diagnosis Date   Cancer ([place])    COPD (chronic obstructive pulmonary disease) ([place])    Dyspnea      Past Surgical History:  Procedure Laterality Date   CATARACT EXTRACTION Right 03/10/2021   IR IMAGING GUIDED PORT INSERTION  07/27/2021    Social History   Socioeconomic History   Marital status: Married    Spouse name: Not on file   Number of children: Not on file   Years of education: Not on file   Highest education level: Not on file  Occupational History   Not on file  Tobacco Use   Smoking status: Every Day    Packs/day: 2.00    Years: 51.00    Total pack years: 102.00    Types: Cigarettes   Smokeless tobacco: Never  Vaping Use   Vaping Use: Never used  Substance and Sexual Activity  Alcohol use: Not Currently   Drug use: Never   Sexual activity: Yes  Other Topics Concern   Not on file  Social History Narrative   Not on file   Social Determinants of Health   Financial Resource Strain: Low Risk  (07/13/2021)   Overall Financial Resource Strain (CARDIA)    Difficulty of Paying Living Expenses: Not hard at all  Food Insecurity: No Food Insecurity (07/13/2021)   Hunger Vital Sign    Worried About Running Out of Food in the Last Year: Never true    Ran Out of Food in the Last Year: Never true  Transportation Needs: No Transportation Needs (07/13/2021)   PRAPARE - Radiographer, therapeutic (Medical): No    Lack of Transportation (Non-Medical): No  Physical Activity: Inactive (07/20/2019)   Exercise Vital Sign    Days of Exercise per Week: 0 days    Minutes of Exercise per Session: 0 min  Stress: Stress Concern Present (07/13/2021)   [place]    Feeling of Stress : To some extent  Social Connections: Moderately Isolated (07/13/2021)   Social Connection and Isolation Panel [NHANES]    Frequency of Communication with Friends and Family: More than three times a week    Frequency of Social Gatherings with Friends and Family: Twice a week    Attends Religious Services: Never    Marine scientist or Organizations: No    Attends Archivist Meetings: Never    Marital Status: Married  Human resources officer Violence: Not At Risk (07/13/2021)   Humiliation, Afraid, Rape, and Kick questionnaire    Fear of Current or Ex-Partner: No    Emotionally Abused: No    Physically Abused: No    Sexually Abused: No    Family History  Problem Relation Age of Onset   Diabetes Mother    Hypertension Mother    Diabetes Father    Hypertension Father    Diabetes Sister    Cancer - Colon Cousin      Current Outpatient Medications:    albuterol (PROVENTIL) (2.5 MG/3ML) 0.083% nebulizer solution, Take 3 mLs (2.5 mg total) by nebulization every 6 (six) hours as needed for wheezing or shortness of breath., Disp: 75 mL, Rfl: 12   budesonide (PULMICORT) 0.25 MG/2ML nebulizer solution, Take 2 mLs (0.25 mg total) by nebulization 2 (two) times daily as needed., Disp: 60 mL, Rfl: 12   clotrimazole (LOTRIMIN) 1 % external solution, SMARTSIG:3-4 Drop(s) In Ear(s) Twice Daily, Disp: , Rfl:    dexamethasone (DECADRON) 4 MG tablet, Take 1 tablet (4 mg total) by mouth daily., Disp: 5 tablet, Rfl: 0   dronabinol (MARINOL) 5 MG capsule, Take 1 capsule (5 mg total) by mouth 2 (two) times daily before lunch and supper., Disp: 30  capsule, Rfl: 0   NEOMYCIN-POLYMYXIN-HYDROCORTISONE (CORTISPORIN) 1 % SOLN OTIC solution, SMARTSIG:3-4 Drop(s) In Ear(s) Twice Daily, Disp: , Rfl:    nystatin (MYCOSTATIN) 100000 UNIT/ML suspension, Take 5 mLs (500,000 Units total) by mouth 3 (three) times daily., Disp: 180 mL, Rfl: 0   OLANZapine (ZYPREXA) 10 MG tablet, Take 1 tablet (10 mg total) by mouth at bedtime., Disp: 30 tablet, Rfl: 0   ondansetron (ZOFRAN) 4 MG tablet, Take 1 tablet (4 mg total) by mouth every 8 (eight) hours as needed for nausea or vomiting., Disp: 60 tablet, Rfl: 3   oxyCODONE (OXY IR/ROXICODONE) 5 MG immediate release tablet, Take 1 tablet (5  mg total) by mouth every 4 (four) hours as needed for severe pain., Disp: 60 tablet, Rfl: 0   pantoprazole (PROTONIX) 20 MG tablet, TAKE 1 TABLET BY MOUTH EVERY DAY, Disp: 90 tablet, Rfl: 0   potassium chloride SA (KLOR-CON M) 20 MEQ tablet, Take 1 tablet (20 mEq total) by mouth 2 (two) times daily for 7 days., Disp: 14 tablet, Rfl: 0   prochlorperazine (COMPAZINE) 10 MG tablet, Take 1 tablet (10 mg total) by mouth every 6 (six) hours as needed for nausea or vomiting., Disp: 60 tablet, Rfl: 2   rosuvastatin (CRESTOR) 10 MG tablet, Take 10 mg by mouth daily., Disp: , Rfl:    traZODone (DESYREL) 50 MG tablet, TAKE 1 TABLET BY MOUTH AT BEDTIME AS NEEDED FOR SLEEP. (Patient not taking: Reported on 09/01/2021), Disp: 90 tablet, Rfl: 1   valACYclovir (VALTREX) 1000 MG tablet, Take 1 tablet (1,000 mg total) by mouth daily., Disp: 10 tablet, Rfl: 10 No current facility-administered medications for this visit.  Facility-Administered Medications Ordered in Other Visits:    heparin lock flush 100 UNIT/ML injection, , , ,    heparin lock flush 100 unit/mL, 500 Units, Intracatheter, Once PRN, Sindy Guadeloupe, MD  Physical exam: There were no vitals filed for this visit. Physical Exam Constitutional:      Appearance: He is ill-appearing.     Comments: Fatigued appearing  Skin:    General:  Skin is warm and dry.  Neurological:     Mental Status: He is oriented to person, place, and time.  Psychiatric:     Comments: Flat affect         Latest Ref Rng & Units 09/29/2021    8:20 AM  CMP  Glucose 70 - 99 mg/dL 114   BUN 8 - 23 mg/dL 11   Creatinine 0.61 - 1.24 mg/dL 0.83   Sodium 135 - 145 mmol/L 135   Potassium 3.5 - 5.1 mmol/L 3.7   Chloride 98 - 111 mmol/L 104   CO2 22 - 32 mmol/L 25   Calcium 8.9 - 10.3 mg/dL 9.4   Total Protein 6.5 - 8.1 g/dL 7.8   Total Bilirubin 0.3 - 1.2 mg/dL 1.3   Alkaline Phos 38 - 126 U/L 77   AST 15 - 41 U/L 22   ALT 0 - 44 U/L 23       Latest Ref Rng & Units 09/29/2021    8:20 AM  CBC  WBC 4.0 - 10.5 K/uL 16.2   Hemoglobin 13.0 - 17.0 g/dL 13.4   Hematocrit 39.0 - 52.0 % 39.4   Platelets 150 - 400 K/uL 348     No images are attached to the encounter.  CT CHEST ABDOMEN PELVIS W CONTRAST  Result Date: 09/21/2021 CLINICAL DATA:  Lung cancer restaging, ongoing chemotherapy * Tracking Code: BO * EXAM: CT CHEST, ABDOMEN, AND PELVIS WITH CONTRAST TECHNIQUE: Multidetector CT imaging of the chest, abdomen and pelvis was performed following the standard protocol during bolus administration of intravenous contrast. RADIATION DOSE REDUCTION: This exam was performed according to the departmental dose-optimization program which includes automated exposure control, adjustment of the mA and/or kV according to patient size and/or use of iterative reconstruction technique. CONTRAST:  178m OMNIPAQUE IOHEXOL 300 MG/ML SOLN additional oral enteric contrast COMPARISON:  CT chest angiogram, 08/07/2021, PET-CT, 06/15/2021 FINDINGS: CT CHEST FINDINGS Cardiovascular: Right chest port catheter. Aortic atherosclerosis. Normal heart size. Three-vessel coronary artery calcifications. No pericardial effusion. Mediastinum/Nodes: No enlarged mediastinal, hilar, or axillary lymph nodes.  Thyroid gland, trachea, and esophagus demonstrate no significant findings.  Lungs/Pleura: Mild predominantly centrilobular emphysema and diffuse bilateral bronchial wall thickening. Background of extensive, fine centrilobular nodularity, most concentrated in the lung apices. New, rounded nodular opacity in the right apex measuring 2.5 x 2.4 cm (series 4, image 26). Interval decrease in size of a previously FDG avid infrahilar mass of the superior segment right lower lobe measuring 3.2 x 2.7 cm, previously 3.8 x 3.4 cm when measured similarly on prior PET-CT (series 4, image 96). Bronchial plugging ground-glass and heterogeneous airspace opacity, and bandlike scarring or atelectasis of the dependent right lower lobe distal to this mass (series 4, image 122). No pleural effusion or pneumothorax. Musculoskeletal: No chest wall abnormality. No acute osseous findings. CT ABDOMEN PELVIS FINDINGS Hepatobiliary: No solid liver abnormality is seen. Small gallstone near the gallbladder neck. No gallbladder wall thickening, or biliary dilatation. Pancreas: Unremarkable. No pancreatic ductal dilatation or surrounding inflammatory changes. Spleen: Normal in size without significant abnormality. Adrenals/Urinary Tract: Decrease in size and solid character of necrotic adrenal masses, on the right measuring 5.7 x 4.3 cm and with only a thin rim of peripheral soft tissue remaining, previously 7.4 x 6.4 cm (series 2, image 64) and a heterogeneous mass on the left measuring 3.5 x 2.9 cm, previously 5.5 x 5.0 cm (series 2, image 63). Kidneys are normal, without renal calculi, solid lesion, or hydronephrosis. Small diverticulum of the left aspect of the bladder. Stomach/Bowel: Stomach is within normal limits. Appendix appears normal. No evidence of bowel wall thickening, distention, or inflammatory changes. Descending and sigmoid diverticulosis. Vascular/Lymphatic: Aortic atherosclerosis. No enlarged abdominal or pelvic lymph nodes. Reproductive: No mass or other abnormality. Other: No abdominal wall hernia or  abnormality. No ascites. Marked interval decrease in size of in omental mass in the ventral right hemiabdomen, measuring 4.9 x 2.6 cm, previously 10.1 x 6.4 cm (series 2, image 88). Interval decrease in size of a mesenteric mass in the right lower quadrant measuring 3.1 x 1.9 cm, previously 4.1 x 2.4 cm (series 2, image 91). Musculoskeletal: No acute osseous findings. IMPRESSION: 1. Interval decrease in size of a previously FDG avid infrahilar mass of the superior segment right lower lobe. 2. Interval decrease in size and solid character of necrotic adrenal masses. 3. Interval decrease in size of omental and mesenteric masses. 4. Previously described hypermetabolic pancreatic nodules are no longer apparent by CT. 5. Above constellation of findings is consistent with treatment response. 6. New, rounded nodular opacity in the right apex measuring 2.5 x 2.4 cm. Given rapid interval development from most recent prior dated 08/07/2021 this finding is most consistent with infection or inflammation, however metastatic disease is not strictly excluded. Attention on follow-up. 7. Emphysema. 8. Coronary artery disease. Aortic Atherosclerosis (ICD10-I70.0). Electronically Signed   By: Delanna Ahmadi M.D.   On: 09/21/2021 08:04     Assessment and plan- Patient is a 66 y.o. male who presents to smoking cessation clinic to discuss strategies, medications and interventions needed to quit smoking.  Tobacco Use Disorder- I addressed the importance of smoking cessation with the patient in detail.  We discussed the health benefits of cessation. We discussed the health detriments of ongoing tobacco use including but not limited to COPD, heart disease and malignancy. We discussed that given his co-morbidities it is especially important that he quit smoking.   We discussed the pathophysiology of nicotine dependence and different strategies to stop smoking. We reviewed strategies to maximize success including removing cigarettes and  smoking materials from environment, stress management, substitution of other forms of reinforcement, support of family/friends, written materials, local smoking cessation programs such as QuitSmart, and pharmacotherapy (Chantix, Nicotine Patches/Gums/Lozenges). We discussed that the gold standard of tobacco cessation is nicotine replacement (with nicotine patches & gum/lozenges) or Chantix or Bupropion.   We discussed that the typical craving/urge to smoke lasts about 3-5 minutes;  biggest trigger(s) to use cigarettes are waking up in the morning/time of day.  Encouraged patient to set some new "rules" that do not allow him to smoke in the car; instead use a piece of nicotine gum or a lozenge when experiencing a craving. Instructions on how to use these nicotine replacement products was discussed.  I shared that there is limited data to suggest that e-cigarettes help in smoking cessation efforts; we do know that many of the flavorings in e-cigarettes have known carcinogens.  Advised to avoid e cigarettes but that the products used in the [facility] program/Water vapor, are useful.   Given his likely moderate dependence (Fagenstrom score 7), patient will need continued support with regards to smoking cessation. We discussed that data suggests that "cold [place]" is the least effective way to stop using tobacco products.  Having a clear "quit plan" is much more effective and requires a step-wise approach with continued support from a tobacco treatment specialist.   We reviewed the multiple options for cessation and I offered that in my experience, starting the [facility] program followed by pharmacologic interventions has had the most success with documented success rates of > 65%. I recommended that patient consider enrolling in the [place] program which can be purchased by going online to https://quitsmart.com/product/quitmsart-kit  Unfortunately, these programs are no longer offered through [facility]. We do  offer resources through Quit Now which is included in the flyer I gave you. They can offer you Free telephonic Coaching. YOu can also consider Quit Smoking Apps like QuitSTART and QuitGuide, Online resources through [employer]. I encouraged him to reach out to me if he has further questions or interest in his continued cessation efforts.  Recommend continued discussions and management by PCP and primary oncology team.   Topics covered: Tobacco improve having car Change in daily routines Dealing with urges to smoke Getting support Anticipating/avoiding triggers Secondhand smoke Behavioral skills Reinforced benefits  Reviewed pharmacotherapy: OTC medications include:  Nicotine replacement therapy (NRT) gum  NRT lozenge  NRT patch  NRT patch plus combination of gum or lozenge  Medical treatment:  NRT nasal spray: Dosing 1-2 doses per hour (8-40 doses per day); 1 dose equals 1 spray in each nostril; each spray level 0.5 mg of nicotine  NRT oral inhaler: Dosing 6-16 cartridges per day; initially use 1 cartridge every 1-2 hours  Bupropion SR: Dosing: Begin 1 to 2 weeks prior to quit date; 150 mg by mouth every a.m. x3 days, then increase to 150 mg by mouth twice daily.  Contraindications: Head injury, seizures, eating disorders, MAOI inhibitor.  Verenicline: Dosing: Begin 1 week prior to quit date; days 1 through 3 0.5 mg by mouth every a.m.; days 4 through 7; 0.5 mg p.o. twice daily; weeks 2 through 12: 1 mg p.o. twice daily.  Monitor for neuropsychiatric symptoms.  Follow-up plan: He elects to trial varenicline and nicotine replacement. Prefers inhaler if covered by insurance. Can also consider patch for long lasting nicotine. His next step is to determine a quit date. We will meet by pohone or in person on that day to review how to take  medications. At his preference, will revisit this in 1-2 weeks.   Visit Diagnosis 1. Tobacco dependence due to cigarettes   2. Small cell lung cancer [facility])      Patient expressed understanding and was in agreement with this plan. He also understands that He can call clinic at any time with any questions, concerns, or complaints.   Greater than 40 minutes was spent in smoking cessation counseling with this patient.   Thank you for allowing me to participate in the care of this very pleasant patient.   Verlon Au, NP [place] [facility] 10/01/2021

## 2021-10-01 NOTE — Patient Instructions (Addendum)
Consider enrolling in the [place] can download this program by going online to https://quitsmart.com/product/quitmsart-kit Unfortunately, these programs are no longer offered through [facility]. We do offer resources through Quit Now which is included in the flyer I gave you. They can offer you Free telephonic Coaching. YOu can also consider Quit Smoking Apps like QuitSTART and QuitGuide, Online resources through [employer].   Once you have decided on a Quit Day, please let me know. For some people, that's a week, others it's a month. Once I hear from you, we'll set up a telephone visit the day before to verify that you have your medications and understand how to take them. We'll follow up a week after that to see how things are going and adjust medications.

## 2021-10-02 ENCOUNTER — Inpatient Hospital Stay: Payer: Medicare HMO

## 2021-10-02 VITALS — BP 112/73 | HR 69 | Temp 99.0°F | Resp 20

## 2021-10-02 DIAGNOSIS — R110 Nausea: Secondary | ICD-10-CM | POA: Diagnosis not present

## 2021-10-02 DIAGNOSIS — Z5111 Encounter for antineoplastic chemotherapy: Secondary | ICD-10-CM | POA: Diagnosis not present

## 2021-10-02 DIAGNOSIS — C7931 Secondary malignant neoplasm of brain: Secondary | ICD-10-CM | POA: Diagnosis not present

## 2021-10-02 DIAGNOSIS — C3431 Malignant neoplasm of lower lobe, right bronchus or lung: Secondary | ICD-10-CM | POA: Diagnosis not present

## 2021-10-02 DIAGNOSIS — D701 Agranulocytosis secondary to cancer chemotherapy: Secondary | ICD-10-CM | POA: Diagnosis not present

## 2021-10-02 DIAGNOSIS — C3490 Malignant neoplasm of unspecified part of unspecified bronchus or lung: Secondary | ICD-10-CM

## 2021-10-02 DIAGNOSIS — F17210 Nicotine dependence, cigarettes, uncomplicated: Secondary | ICD-10-CM | POA: Diagnosis not present

## 2021-10-02 DIAGNOSIS — Z5112 Encounter for antineoplastic immunotherapy: Secondary | ICD-10-CM | POA: Diagnosis not present

## 2021-10-02 DIAGNOSIS — C786 Secondary malignant neoplasm of retroperitoneum and peritoneum: Secondary | ICD-10-CM | POA: Diagnosis not present

## 2021-10-02 DIAGNOSIS — C7970 Secondary malignant neoplasm of unspecified adrenal gland: Secondary | ICD-10-CM | POA: Diagnosis not present

## 2021-10-02 MED ORDER — HEPARIN SOD (PORK) LOCK FLUSH 100 UNIT/ML IV SOLN
500.0000 [IU] | Freq: Once | INTRAVENOUS | Status: AC
  Administered 2021-10-02: 500 [IU] via INTRAVENOUS
  Filled 2021-10-02: qty 5

## 2021-10-02 MED ORDER — SODIUM CHLORIDE 0.9 % IV SOLN
Freq: Once | INTRAVENOUS | Status: AC
  Filled 2021-10-02: qty 250

## 2021-10-02 MED ORDER — PEGFILGRASTIM-CBQV 6 MG/0.6ML ~~LOC~~ SOSY
6.0000 mg | PREFILLED_SYRINGE | Freq: Once | SUBCUTANEOUS | Status: AC
  Administered 2021-10-02: 6 mg via SUBCUTANEOUS
  Filled 2021-10-02: qty 0.6

## 2021-10-02 MED ORDER — SODIUM CHLORIDE 0.9% FLUSH
10.0000 mL | Freq: Once | INTRAVENOUS | Status: AC
  Administered 2021-10-02: 10 mL via INTRAVENOUS
  Filled 2021-10-02: qty 10

## 2021-10-05 ENCOUNTER — Other Ambulatory Visit: Payer: Medicare HMO

## 2021-10-05 ENCOUNTER — Ambulatory Visit: Payer: Medicare HMO

## 2021-10-05 ENCOUNTER — Ambulatory Visit: Payer: Medicare HMO | Admitting: Oncology

## 2021-10-05 DIAGNOSIS — H7201 Central perforation of tympanic membrane, right ear: Secondary | ICD-10-CM | POA: Diagnosis not present

## 2021-10-05 DIAGNOSIS — H9211 Otorrhea, right ear: Secondary | ICD-10-CM | POA: Diagnosis not present

## 2021-10-05 MED ORDER — VARENICLINE TARTRATE (STARTER) 0.5 MG X 11 & 1 MG X 42 PO TBPK: ORAL_TABLET | ORAL | 0 refills | Status: DC

## 2021-10-06 ENCOUNTER — Ambulatory Visit: Payer: Medicare HMO

## 2021-10-06 ENCOUNTER — Encounter: Payer: Self-pay | Admitting: Oncology

## 2021-10-06 MED ORDER — NICOTINE 10 MG IN INHA: 1.0000 | RESPIRATORY_TRACT | 1 refills | Status: DC | PRN

## 2021-10-07 ENCOUNTER — Inpatient Hospital Stay: Payer: Medicare HMO

## 2021-10-07 ENCOUNTER — Ambulatory Visit: Payer: Medicare HMO

## 2021-10-07 ENCOUNTER — Other Ambulatory Visit: Payer: Self-pay | Admitting: Nurse Practitioner

## 2021-10-08 ENCOUNTER — Ambulatory Visit: Payer: Medicare HMO

## 2021-10-09 ENCOUNTER — Inpatient Hospital Stay: Payer: Medicare HMO

## 2021-10-12 ENCOUNTER — Other Ambulatory Visit: Payer: Medicare HMO

## 2021-10-12 ENCOUNTER — Ambulatory Visit: Payer: Medicare HMO

## 2021-10-12 ENCOUNTER — Ambulatory Visit: Payer: Medicare HMO | Admitting: Oncology

## 2021-10-12 ENCOUNTER — Telehealth: Payer: Self-pay | Admitting: *Deleted

## 2021-10-12 ENCOUNTER — Ambulatory Visit
Admission: RE | Admit: 2021-10-12 | Discharge: 2021-10-12 | Disposition: A | Discharge status: Home / Self Care | Payer: Medicare HMO | Source: Ambulatory Visit | Attending: Radiation Oncology | Admitting: Radiation Oncology

## 2021-10-12 ENCOUNTER — Inpatient Hospital Stay: Payer: Medicare HMO | Attending: Oncology

## 2021-10-12 VITALS — BP 108/71 | HR 78 | Temp 97.2°F

## 2021-10-12 DIAGNOSIS — R630 Anorexia: Secondary | ICD-10-CM | POA: Diagnosis not present

## 2021-10-12 DIAGNOSIS — Z79899 Other long term (current) drug therapy: Secondary | ICD-10-CM | POA: Diagnosis not present

## 2021-10-12 DIAGNOSIS — C7931 Secondary malignant neoplasm of brain: Secondary | ICD-10-CM | POA: Insufficient documentation

## 2021-10-12 DIAGNOSIS — D6481 Anemia due to antineoplastic chemotherapy: Secondary | ICD-10-CM | POA: Diagnosis not present

## 2021-10-12 DIAGNOSIS — C786 Secondary malignant neoplasm of retroperitoneum and peritoneum: Secondary | ICD-10-CM | POA: Insufficient documentation

## 2021-10-12 DIAGNOSIS — E876 Hypokalemia: Secondary | ICD-10-CM | POA: Diagnosis not present

## 2021-10-12 DIAGNOSIS — C3431 Malignant neoplasm of lower lobe, right bronchus or lung: Secondary | ICD-10-CM | POA: Diagnosis not present

## 2021-10-12 DIAGNOSIS — Z5112 Encounter for antineoplastic immunotherapy: Secondary | ICD-10-CM | POA: Diagnosis not present

## 2021-10-12 DIAGNOSIS — Z5111 Encounter for antineoplastic chemotherapy: Secondary | ICD-10-CM | POA: Diagnosis not present

## 2021-10-12 DIAGNOSIS — E860 Dehydration: Secondary | ICD-10-CM

## 2021-10-12 DIAGNOSIS — C3490 Malignant neoplasm of unspecified part of unspecified bronchus or lung: Secondary | ICD-10-CM

## 2021-10-12 MED ORDER — ONDANSETRON HCL 4 MG PO TABS: 4.0000 mg | ORAL_TABLET | Freq: Three times a day (TID) | ORAL | 3 refills | Status: DC | PRN

## 2021-10-12 MED ORDER — SODIUM CHLORIDE 0.9 % IV SOLN
Freq: Once | INTRAVENOUS | Status: AC
  Filled 2021-10-12: qty 250

## 2021-10-12 MED ORDER — SODIUM CHLORIDE 0.9% FLUSH
10.0000 mL | Freq: Once | INTRAVENOUS | Status: AC
  Administered 2021-10-12: 10 mL via INTRAVENOUS
  Filled 2021-10-12: qty 10

## 2021-10-12 MED ORDER — HEPARIN SOD (PORK) LOCK FLUSH 100 UNIT/ML IV SOLN
500.0000 [IU] | Freq: Once | INTRAVENOUS | Status: AC
  Administered 2021-10-12: 500 [IU] via INTRAVENOUS
  Filled 2021-10-12: qty 5

## 2021-10-12 NOTE — Progress Notes (Signed)
Radiation Oncology Follow up Note  Name: Maurice Simpson   Date:   10/12/2021 MRN:  [id number] DOB: June 17, 1955    This 66 y.o. male presents to the clinic today for 1 month follow-up status post palliative radiation therapy to whole brain first stage IV lung cancer.  Small cell carcinoma  REFERRING PROVIDER: [name], NP  HPI: Patient is a 66 year old male now 1 month out of completed whole brain radiation therapy for extensive stage small cell lung cancer with brain omental pancreatic and adrenal metastasis.  He is seen today in routine follow-up is doing fairly well.  He is currently under treatment.  With [place] Etoposide and Tecentriq.  He has had a good clinical response from treatment.  Seen today in follow-up from a neurologic standpoint he is stable he specifically denies any focal neurologic deficits any change in visual fields or headaches.  COMPLICATIONS OF TREATMENT: none  FOLLOW UP COMPLIANCE: keeps appointments   PHYSICAL EXAM:  There were no vitals taken for this visit. Wheelchair-bound male in NAD.  Motor or sensory in detail levels are equal and symmetric in upper lower extremities crude visual fields are within normal range proprioception is intact.  Well-developed well-nourished patient in NAD. HEENT reveals PERLA, EOMI, discs not visualized.  Oral cavity is clear. No oral mucosal lesions are identified. Neck is clear without evidence of cervical or supraclavicular adenopathy. Lungs are clear to A&P. Cardiac examination is essentially unremarkable with regular rate and rhythm without murmur rub or thrill. Abdomen is benign with no organomegaly or masses noted. Motor sensory and DTR levels are equal and symmetric in the upper and lower extremities. Cranial nerves II through XII are grossly intact. Proprioception is intact. No peripheral adenopathy or edema is identified. No motor or sensory levels are noted. Crude visual fields are within normal range.  RADIOLOGY RESULTS:  No current films for review  PLAN: Present time he continues on therapy through medical oncology for his extensive stage small cell lung cancer.  I am going to turn follow-up care over to medical oncology.  I would be happy to reevaluate the patient anytime should that be indicated.  I would like to take this opportunity to thank you for allowing me to participate in the care of your patient.Noreene Filbert, MD

## 2021-10-12 NOTE — Telephone Encounter (Signed)
Refill for ondansetron sent to pharmacy.

## 2021-10-13 ENCOUNTER — Ambulatory Visit: Payer: Medicare HMO

## 2021-10-13 ENCOUNTER — Other Ambulatory Visit: Payer: Medicare HMO

## 2021-10-13 ENCOUNTER — Ambulatory Visit: Payer: Medicare HMO | Admitting: Oncology

## 2021-10-14 ENCOUNTER — Ambulatory Visit: Payer: Medicare HMO

## 2021-10-15 ENCOUNTER — Ambulatory Visit: Payer: Medicare HMO

## 2021-10-15 ENCOUNTER — Other Ambulatory Visit: Payer: Self-pay | Admitting: *Deleted

## 2021-10-15 NOTE — Telephone Encounter (Signed)
Pt requesting refill of marinol.

## 2021-10-16 ENCOUNTER — Ambulatory Visit: Payer: Medicare HMO

## 2021-10-16 ENCOUNTER — Inpatient Hospital Stay: Payer: Medicare HMO

## 2021-10-16 ENCOUNTER — Encounter: Payer: Self-pay | Admitting: Oncology

## 2021-10-16 MED ORDER — DRONABINOL 5 MG PO CAPS: 5.0000 mg | ORAL_CAPSULE | Freq: Two times a day (BID) | ORAL | 0 refills | Status: DC

## 2021-10-16 MED FILL — Fosaprepitant Dimeglumine For IV Infusion 150 MG (Base Eq): INTRAVENOUS | Qty: 5 | Status: AC

## 2021-10-16 MED FILL — Dexamethasone Sodium Phosphate Inj 100 MG/10ML: INTRAMUSCULAR | Qty: 1 | Status: AC

## 2021-10-19 ENCOUNTER — Inpatient Hospital Stay: Payer: Medicare HMO

## 2021-10-19 ENCOUNTER — Encounter: Payer: Self-pay | Admitting: Oncology

## 2021-10-19 ENCOUNTER — Inpatient Hospital Stay (HOSPITAL_BASED_OUTPATIENT_CLINIC_OR_DEPARTMENT_OTHER): Payer: Medicare HMO | Admitting: Oncology

## 2021-10-19 ENCOUNTER — Inpatient Hospital Stay: Payer: Medicare HMO | Admitting: Nurse Practitioner

## 2021-10-19 ENCOUNTER — Other Ambulatory Visit: Payer: Self-pay

## 2021-10-19 VITALS — BP 120/75 | HR 95 | Temp 97.7°F | Wt 182.0 lb

## 2021-10-19 DIAGNOSIS — C3490 Malignant neoplasm of unspecified part of unspecified bronchus or lung: Secondary | ICD-10-CM | POA: Diagnosis not present

## 2021-10-19 DIAGNOSIS — D6481 Anemia due to antineoplastic chemotherapy: Secondary | ICD-10-CM

## 2021-10-19 DIAGNOSIS — Z5111 Encounter for antineoplastic chemotherapy: Secondary | ICD-10-CM | POA: Diagnosis not present

## 2021-10-19 DIAGNOSIS — C3431 Malignant neoplasm of lower lobe, right bronchus or lung: Secondary | ICD-10-CM | POA: Diagnosis not present

## 2021-10-19 DIAGNOSIS — C786 Secondary malignant neoplasm of retroperitoneum and peritoneum: Secondary | ICD-10-CM | POA: Diagnosis not present

## 2021-10-19 DIAGNOSIS — Z5112 Encounter for antineoplastic immunotherapy: Secondary | ICD-10-CM | POA: Diagnosis not present

## 2021-10-19 DIAGNOSIS — T451X5A Adverse effect of antineoplastic and immunosuppressive drugs, initial encounter: Secondary | ICD-10-CM | POA: Diagnosis not present

## 2021-10-19 DIAGNOSIS — Z79899 Other long term (current) drug therapy: Secondary | ICD-10-CM | POA: Diagnosis not present

## 2021-10-19 DIAGNOSIS — C7931 Secondary malignant neoplasm of brain: Secondary | ICD-10-CM | POA: Diagnosis not present

## 2021-10-19 DIAGNOSIS — E876 Hypokalemia: Secondary | ICD-10-CM | POA: Diagnosis not present

## 2021-10-19 DIAGNOSIS — Z95828 Presence of other vascular implants and grafts: Secondary | ICD-10-CM

## 2021-10-19 DIAGNOSIS — R630 Anorexia: Secondary | ICD-10-CM | POA: Diagnosis not present

## 2021-10-19 LAB — CBC WITH DIFFERENTIAL/PLATELET
Abs Immature Granulocytes: 0.33 10*3/uL — ABNORMAL HIGH (ref 0.00–0.07)
Basophils Absolute: 0.1 10*3/uL (ref 0.0–0.1)
Basophils Relative: 0 %
Eosinophils Absolute: 0.1 10*3/uL (ref 0.0–0.5)
Eosinophils Relative: 0 %
HCT: 28.4 % — ABNORMAL LOW (ref 39.0–52.0)
Hemoglobin: 9.6 g/dL — ABNORMAL LOW (ref 13.0–17.0)
Immature Granulocytes: 2 %
Lymphocytes Relative: 10 %
Lymphs Abs: 1.6 10*3/uL (ref 0.7–4.0)
MCH: 31.9 pg (ref 26.0–34.0)
MCHC: 33.8 g/dL (ref 30.0–36.0)
MCV: 94.4 fL (ref 80.0–100.0)
Monocytes Absolute: 1.3 10*3/uL — ABNORMAL HIGH (ref 0.1–1.0)
Monocytes Relative: 8 %
Neutro Abs: 13.8 10*3/uL — ABNORMAL HIGH (ref 1.7–7.7)
Neutrophils Relative %: 80 %
Platelets: 299 10*3/uL (ref 150–400)
RBC: 3.01 MIL/uL — ABNORMAL LOW (ref 4.22–5.81)
RDW: 15.8 % — ABNORMAL HIGH (ref 11.5–15.5)
WBC: 17.2 10*3/uL — ABNORMAL HIGH (ref 4.0–10.5)
nRBC: 0 % (ref 0.0–0.2)

## 2021-10-19 LAB — COMPREHENSIVE METABOLIC PANEL
ALT: 38 U/L (ref 0–44)
AST: 54 U/L — ABNORMAL HIGH (ref 15–41)
Albumin: 2.8 g/dL — ABNORMAL LOW (ref 3.5–5.0)
Alkaline Phosphatase: 120 U/L (ref 38–126)
Anion gap: 8 (ref 5–15)
BUN: 15 mg/dL (ref 8–23)
CO2: 28 mmol/L (ref 22–32)
Calcium: 8.6 mg/dL — ABNORMAL LOW (ref 8.9–10.3)
Chloride: 97 mmol/L — ABNORMAL LOW (ref 98–111)
Creatinine, Ser: 0.76 mg/dL (ref 0.61–1.24)
GFR, Estimated: >60 mL/min (ref >60)
Glucose, Bld: 116 mg/dL — ABNORMAL HIGH (ref 70–99)
Potassium: 3.6 mmol/L (ref 3.5–5.1)
Sodium: 133 mmol/L — ABNORMAL LOW (ref 135–145)
Total Bilirubin: 1 mg/dL (ref 0.3–1.2)
Total Protein: 7 g/dL (ref 6.5–8.1)

## 2021-10-19 MED ORDER — SODIUM CHLORIDE 0.9 % IV SOLN
440.0000 mg | Freq: Once | INTRAVENOUS | Status: AC
  Administered 2021-10-19: 440 mg via INTRAVENOUS
  Filled 2021-10-19: qty 44

## 2021-10-19 MED ORDER — SODIUM CHLORIDE 0.9 % IV SOLN
Freq: Once | INTRAVENOUS | Status: AC
  Filled 2021-10-19: qty 250

## 2021-10-19 MED ORDER — SODIUM CHLORIDE 0.9 % IV SOLN
1200.0000 mg | Freq: Once | INTRAVENOUS | Status: AC
  Administered 2021-10-19: 1200 mg via INTRAVENOUS
  Filled 2021-10-19: qty 20

## 2021-10-19 MED ORDER — HEPARIN SOD (PORK) LOCK FLUSH 100 UNIT/ML IV SOLN
500.0000 [IU] | Freq: Once | INTRAVENOUS | Status: AC | PRN
  Administered 2021-10-19: 500 [IU]
  Filled 2021-10-19: qty 5

## 2021-10-19 MED ORDER — SODIUM CHLORIDE 0.9 % IV SOLN
10.0000 mg | Freq: Once | INTRAVENOUS | Status: AC
  Administered 2021-10-19: 10 mg via INTRAVENOUS
  Filled 2021-10-19: qty 10

## 2021-10-19 MED ORDER — PALONOSETRON HCL INJECTION 0.25 MG/5ML
0.2500 mg | Freq: Once | INTRAVENOUS | Status: AC
  Administered 2021-10-19: 0.25 mg via INTRAVENOUS
  Filled 2021-10-19: qty 5

## 2021-10-19 MED ORDER — SODIUM CHLORIDE 0.9 % IV SOLN
150.0000 mg | Freq: Once | INTRAVENOUS | Status: AC
  Administered 2021-10-19: 150 mg via INTRAVENOUS
  Filled 2021-10-19: qty 150

## 2021-10-19 MED ORDER — SODIUM CHLORIDE 0.9 % IV SOLN
95.6000 mg/m2 | Freq: Once | INTRAVENOUS | Status: AC
  Administered 2021-10-19: 200 mg via INTRAVENOUS
  Filled 2021-10-19: qty 10

## 2021-10-19 MED ORDER — SODIUM CHLORIDE 0.9% FLUSH
10.0000 mL | INTRAVENOUS | Status: DC | PRN
  Filled 2021-10-19: qty 10

## 2021-10-19 MED FILL — Dexamethasone Sodium Phosphate Inj 100 MG/10ML: INTRAMUSCULAR | Qty: 1 | Status: AC

## 2021-10-19 NOTE — Progress Notes (Signed)
Patient here for follow up. Patient complains of no appetite. He has lost 4 lbs since last visit. Patient states he had fall on Friday morning.

## 2021-10-19 NOTE — Progress Notes (Signed)
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  He had a lung cancer screening protocol on 05/29/2021 which showed a right lower lobe lung mass with concern for bilateral adrenal masses.  This was followed by a PET CT scan on 06/15/2021 which showed a right lower lobe soft tissue mass with an SUV uptake of 16.6.  No hypermetabolic hilar or mediastinal adenopathy.  Bilateral hypermetabolic adrenal masses 6.7 and 5.1 cm respectively.  2.8 cm soft tissue lesion in the body of the pancreas and 1.9 cm soft tissue lesion in the tail of the pancreas.  2.3 cm retroperitoneal soft tissue nodule.  10 cm x 6.4 cm soft tissue mass inferior to the transverse colon in the midline.  4.1 x 2.4 cm right mesenteric soft tissue mass with an SUV of 11.7.   MRI brain was significant for multiple areas of intracranial metastatic disease.  Largest lesion in the left frontal lobe with significant edema internal hemorrhage and is causing a mass effect with 6 mm midline shift.  30 metastatic foci seen in the brain.  Patient is undergoing whole brain radiation for the same.     Abdominal mass biopsy showed high-grade carcinoma compatible with metastatic small cell carcinoma of lung origin.  Interval history-patient had a fall a few days ago when he was trying to get back into the house from his garage when he was resting in the middle of the  night.  Did not lose consciousness.  He feels sore in his neck but is otherwise recovering well.  He has ongoing fatigue.  He is on eardrops for his external ear infection on the right side.  He is unable to hear much on the right side.  Ear discharge has resolved.  ECOG PS- 2 Pain scale- 0   Review of systems- Review of Systems  Constitutional:  Positive for malaise/fatigue. Negative for chills, fever and weight loss.  HENT:  Negative for congestion, ear discharge and nosebleeds.   Eyes:  Negative for blurred vision.  Respiratory:  Negative for cough, hemoptysis, sputum production, shortness of breath and wheezing.   Cardiovascular:  Negative for chest pain, palpitations, orthopnea and claudication.  Gastrointestinal:  Negative for abdominal pain, blood in stool, constipation, diarrhea, heartburn, melena, nausea and vomiting.  Genitourinary:  Negative for dysuria, flank pain, frequency, hematuria and urgency.  Musculoskeletal:  Positive for falls. Negative for back pain, joint pain and myalgias.  Skin:  Negative for rash.  Neurological:  Negative for dizziness, tingling, focal weakness, seizures, weakness and headaches.  Endo/Heme/Allergies:  Does not bruise/bleed easily.  Psychiatric/Behavioral:  Negative for depression and suicidal ideas. The patient does not have insomnia.       Allergies  Allergen Reactions   Bee Venom      Past Medical History:  Diagnosis Date   Cancer ([place])    COPD (chronic obstructive pulmonary disease) ([place])    Dyspnea      Past Surgical History:  Procedure Laterality Date  CATARACT EXTRACTION Right 03/10/2021   IR IMAGING GUIDED PORT INSERTION  07/27/2021    Social History   Socioeconomic History   Marital status: Married    Spouse name: Not on file   Number of children: Not on file   Years of education: Not on file   Highest education level: Not on file  Occupational History   Not on file  Tobacco Use   Smoking status: Every Day     Packs/day: 2.00    Years: 51.00    Total pack years: 102.00    Types: Cigarettes   Smokeless tobacco: Never  Vaping Use   Vaping Use: Never used  Substance and Sexual Activity   Alcohol use: Not Currently   Drug use: Never   Sexual activity: Yes  Other Topics Concern   Not on file  Social History Narrative   Not on file   Social Determinants of Health   Financial Resource Strain: Low Risk  (07/13/2021)   Overall Financial Resource Strain (CARDIA)    Difficulty of Paying Living Expenses: Not hard at all  Food Insecurity: No Food Insecurity (07/13/2021)   Hunger Vital Sign    Worried About Running Out of Food in the Last Year: Never true    Ran Out of Food in the Last Year: Never true  Transportation Needs: No Transportation Needs (07/13/2021)   PRAPARE - Hydrologist (Medical): No    Lack of Transportation (Non-Medical): No  Physical Activity: Inactive (07/20/2019)   Exercise Vital Sign    Days of Exercise per Week: 0 days    Minutes of Exercise per Session: 0 min  Stress: Stress Concern Present (07/13/2021)   [place]    Feeling of Stress : To some extent  Social Connections: Moderately Isolated (07/13/2021)   Social Connection and Isolation Panel [NHANES]    Frequency of Communication with Friends and Family: More than three times a week    Frequency of Social Gatherings with Friends and Family: Twice a week    Attends Religious Services: Never    Marine scientist or Organizations: No    Attends Archivist Meetings: Never    Marital Status: Married  Human resources officer Violence: Not At Risk (07/13/2021)   Humiliation, Afraid, Rape, and Kick questionnaire    Fear of Current or Ex-Partner: No    Emotionally Abused: No    Physically Abused: No    Sexually Abused: No    Family History  Problem Relation Age of Onset   Diabetes Mother    Hypertension Mother    Diabetes  Father    Hypertension Father    Diabetes Sister    Cancer - Colon Cousin      Current Outpatient Medications:    albuterol (PROVENTIL) (2.5 MG/3ML) 0.083% nebulizer solution, Take 3 mLs (2.5 mg total) by nebulization every 6 (six) hours as needed for wheezing or shortness of breath., Disp: 75 mL, Rfl: 12   budesonide (PULMICORT) 0.25 MG/2ML nebulizer solution, Take 2 mLs (0.25 mg total) by nebulization 2 (two) times daily as needed., Disp: 60 mL, Rfl: 12   clotrimazole (LOTRIMIN) 1 % external solution, SMARTSIG:3-4 Drop(s) In Ear(s) Twice Daily, Disp: , Rfl:    dexamethasone (DECADRON) 4 MG tablet, Take 1 tablet (4 mg total) by mouth daily., Disp: 5 tablet, Rfl: 0   dronabinol (MARINOL) 5 MG capsule, Take 1 capsule (5 mg total) by mouth 2 (two) times  daily before lunch and supper., Disp: 30 capsule, Rfl: 0   NEOMYCIN-POLYMYXIN-HYDROCORTISONE (CORTISPORIN) 1 % SOLN OTIC solution, SMARTSIG:3-4 Drop(s) In Ear(s) Twice Daily, Disp: , Rfl:    nicotine (NICOTROL) 10 MG inhaler, Inhale 1 Cartridge (1 continuous puffing total) into the lungs as needed for smoking cessation., Disp: 42 each, Rfl: 1   nystatin (MYCOSTATIN) 100000 UNIT/ML suspension, Take 5 mLs (500,000 Units total) by mouth 3 (three) times daily., Disp: 180 mL, Rfl: 0   OLANZapine (ZYPREXA) 10 MG tablet, Take 1 tablet (10 mg total) by mouth at bedtime., Disp: 30 tablet, Rfl: 0   ondansetron (ZOFRAN) 4 MG tablet, Take 1 tablet (4 mg total) by mouth every 8 (eight) hours as needed for nausea or vomiting., Disp: 60 tablet, Rfl: 3   oxyCODONE (OXY IR/ROXICODONE) 5 MG immediate release tablet, Take 1 tablet (5 mg total) by mouth every 4 (four) hours as needed for severe pain., Disp: 60 tablet, Rfl: 0   pantoprazole (PROTONIX) 20 MG tablet, TAKE 1 TABLET BY MOUTH EVERY DAY, Disp: 90 tablet, Rfl: 0   prochlorperazine (COMPAZINE) 10 MG tablet, Take 1 tablet (10 mg total) by mouth every 6 (six) hours as needed for nausea or vomiting., Disp: 60  tablet, Rfl: 2   rosuvastatin (CRESTOR) 10 MG tablet, Take 10 mg by mouth daily., Disp: , Rfl:    valACYclovir (VALTREX) 1000 MG tablet, Take 1 tablet (1,000 mg total) by mouth daily., Disp: 10 tablet, Rfl: 10   Varenicline Tartrate, Starter, (CHANTIX STARTING MONTH PAK) 0.5 MG X 11 & 1 MG X 42 TBPK, Take 0.5mg  tablet by mouth every morning for 3 days, THEN 0.5 mg tablet by mouth 2 (two) times daily for 4 days, THEN 1mg  tablet by mouth 2 (two) times daily, Disp: 53 each, Rfl: 0   potassium chloride SA (KLOR-CON M) 20 MEQ tablet, Take 1 tablet (20 mEq total) by mouth 2 (two) times daily for 7 days., Disp: 14 tablet, Rfl: 0   traZODone (DESYREL) 50 MG tablet, TAKE 1 TABLET BY MOUTH AT BEDTIME AS NEEDED FOR SLEEP. (Patient not taking: Reported on 09/01/2021), Disp: 90 tablet, Rfl: 1  Physical exam:  Vitals:   10/19/21 0925  BP: 120/75  Pulse: 95  Temp: 97.7 F (36.5 C)  TempSrc: Tympanic  Weight: 182 lb (82.6 kg)   Physical Exam Constitutional:      General: He is not in acute distress.    Comments: Sitting in a wheelchair and appears in no acute distress.  Appears fatigued  Cardiovascular:     Rate and Rhythm: Normal rate and regular rhythm.     Heart sounds: Normal heart sounds.  Pulmonary:     Effort: Pulmonary effort is normal.     Breath sounds: Normal breath sounds.  Abdominal:     General: Bowel sounds are normal.     Palpations: Abdomen is soft.  Skin:    General: Skin is warm and dry.  Neurological:     Mental Status: He is alert and oriented to person, place, and time.         Latest Ref Rng & Units 10/19/2021    9:08 AM  CMP  Glucose 70 - 99 mg/dL 116   BUN 8 - 23 mg/dL 15   Creatinine 0.61 - 1.24 mg/dL 0.76   Sodium 135 - 145 mmol/L 133   Potassium 3.5 - 5.1 mmol/L 3.6   Chloride 98 - 111 mmol/L 97   CO2 22 - 32 mmol/L 28  Calcium 8.9 - 10.3 mg/dL 8.6   Total Protein 6.5 - 8.1 g/dL 7.0   Total Bilirubin 0.3 - 1.2 mg/dL 1.0   Alkaline Phos 38 - 126 U/L 120    AST 15 - 41 U/L 54   ALT 0 - 44 U/L 38       Latest Ref Rng & Units 10/19/2021    9:08 AM  CBC  WBC 4.0 - 10.5 K/uL 17.2   Hemoglobin 13.0 - 17.0 g/dL 9.6   Hematocrit 39.0 - 52.0 % 28.4   Platelets 150 - 400 K/uL 299      Assessment and plan- Patient is a 66 y.o. male with extensive stage small cell lung cancer here for on treatment assessment prior to cycle 4 of carbo etoposide Tecentriq chemotherapy  Counts okay to proceed with cycle 4 of carbo etoposide Tecentriq chemotherapy today.  He will receive etoposide tomorrow and day after and Udenyca on day 4.  I will see him back in 3 weeks for cycle 1 of maintenance Tecentriq.  We will obtain MRI brain with and without contrast prior.  Since he had a delay in obtaining interim scans which she just had an early September 2023 I will plan to repeat his CT and bone scans suppertime in 4 to 5 weeks time.  Falls: I will get him a prescription for rolling walker.  His wife is setting up for medical alert system.  Patient understands that he needs to stay inside the house and not try to get up on his own especially in the middle of the night.  I will plan to give him IV fluids twice a week on Tuesdays and Fridays.  Hearing loss on the right side: Likely secondary to his recent ear infection.  He is on topical antibiotics and will also follow-up with ENT.  Chemo-induced anemia: Continue to monitor this would be his last chemo cycle and following this he will be on maintenance immunotherapy alone.   Visit Diagnosis 1. Small cell lung cancer ([place])   2. Malignant neoplasm of unspecified part of unspecified bronchus or lung ([place])   3. Encounter for antineoplastic immunotherapy   4. Encounter for antineoplastic chemotherapy      Dr. Randa Evens, MD, MPH [facility] at [facility] [phone] 10/19/2021 9:57 AM

## 2021-10-20 ENCOUNTER — Other Ambulatory Visit: Payer: Self-pay | Admitting: Oncology

## 2021-10-20 ENCOUNTER — Inpatient Hospital Stay: Payer: Medicare HMO

## 2021-10-20 VITALS — BP 127/74 | HR 77 | Temp 97.7°F | Resp 20

## 2021-10-20 DIAGNOSIS — Z79899 Other long term (current) drug therapy: Secondary | ICD-10-CM | POA: Diagnosis not present

## 2021-10-20 DIAGNOSIS — C3431 Malignant neoplasm of lower lobe, right bronchus or lung: Secondary | ICD-10-CM | POA: Diagnosis not present

## 2021-10-20 DIAGNOSIS — D6481 Anemia due to antineoplastic chemotherapy: Secondary | ICD-10-CM | POA: Diagnosis not present

## 2021-10-20 DIAGNOSIS — Z5112 Encounter for antineoplastic immunotherapy: Secondary | ICD-10-CM | POA: Diagnosis not present

## 2021-10-20 DIAGNOSIS — E876 Hypokalemia: Secondary | ICD-10-CM | POA: Diagnosis not present

## 2021-10-20 DIAGNOSIS — Z5111 Encounter for antineoplastic chemotherapy: Secondary | ICD-10-CM | POA: Diagnosis not present

## 2021-10-20 DIAGNOSIS — C7931 Secondary malignant neoplasm of brain: Secondary | ICD-10-CM | POA: Diagnosis not present

## 2021-10-20 DIAGNOSIS — C3490 Malignant neoplasm of unspecified part of unspecified bronchus or lung: Secondary | ICD-10-CM

## 2021-10-20 DIAGNOSIS — R630 Anorexia: Secondary | ICD-10-CM | POA: Diagnosis not present

## 2021-10-20 DIAGNOSIS — C786 Secondary malignant neoplasm of retroperitoneum and peritoneum: Secondary | ICD-10-CM | POA: Diagnosis not present

## 2021-10-20 MED ORDER — SODIUM CHLORIDE 0.9 % IV SOLN
INTRAVENOUS | Status: DC
  Filled 2021-10-20: qty 250

## 2021-10-20 MED ORDER — SODIUM CHLORIDE 0.9 % IV SOLN
10.0000 mg | Freq: Once | INTRAVENOUS | Status: AC
  Administered 2021-10-20: 10 mg via INTRAVENOUS
  Filled 2021-10-20: qty 10

## 2021-10-20 MED ORDER — HEPARIN SOD (PORK) LOCK FLUSH 100 UNIT/ML IV SOLN
500.0000 [IU] | Freq: Once | INTRAVENOUS | Status: AC | PRN
  Administered 2021-10-20: 500 [IU]
  Filled 2021-10-20: qty 5

## 2021-10-20 MED ORDER — SODIUM CHLORIDE 0.9 % IV SOLN
95.6000 mg/m2 | Freq: Once | INTRAVENOUS | Status: AC
  Administered 2021-10-20: 200 mg via INTRAVENOUS
  Filled 2021-10-20: qty 10

## 2021-10-20 MED FILL — Dexamethasone Sodium Phosphate Inj 100 MG/10ML: INTRAMUSCULAR | Qty: 1 | Status: AC

## 2021-10-20 NOTE — Patient Instructions (Signed)
[facility] CANCER CTR AT [place]  Discharge Instructions: Thank you for choosing [place] to provide your oncology and hematology care.  If you have a lab appointment with the [place], please go directly to the [facility] and check in at the registration area.  Wear comfortable clothing and clothing appropriate for easy access to any Portacath or PICC line.   We strive to give you quality time with your provider. You may need to reschedule your appointment if you arrive late (15 or more minutes).  Arriving late affects you and other patients whose appointments are after yours.  Also, if you miss three or more appointments without notifying the office, you may be dismissed from the clinic at the provider's discretion.      For prescription refill requests, have your pharmacy contact our office and allow 72 hours for refills to be completed.    Today you received the following chemotherapy and/or immunotherapy agents Etoposide.      To help prevent nausea and vomiting after your treatment, we encourage you to take your nausea medication as directed.  BELOW ARE SYMPTOMS THAT SHOULD BE REPORTED IMMEDIATELY: *FEVER GREATER THAN 100.4 F (38 C) OR HIGHER *CHILLS OR SWEATING *NAUSEA AND VOMITING THAT IS NOT CONTROLLED WITH YOUR NAUSEA MEDICATION *UNUSUAL SHORTNESS OF BREATH *UNUSUAL BRUISING OR BLEEDING *URINARY PROBLEMS (pain or burning when urinating, or frequent urination) *BOWEL PROBLEMS (unusual diarrhea, constipation, pain near the anus) TENDERNESS IN MOUTH AND THROAT WITH OR WITHOUT PRESENCE OF ULCERS (sore throat, sores in mouth, or a toothache) UNUSUAL RASH, SWELLING OR PAIN  UNUSUAL VAGINAL DISCHARGE OR ITCHING   Items with * indicate a potential emergency and should be followed up as soon as possible or go to the Emergency Department if any problems should occur.  Please show the CHEMOTHERAPY ALERT CARD or IMMUNOTHERAPY ALERT CARD at check-in to  the Emergency Department and triage nurse.  Should you have questions after your visit or need to cancel or reschedule your appointment, please contact [facility] CANCER [place] AT [place]  [phone] and follow the prompts.  Office hours are 8:00 a.m. to 4:30 p.m. Monday - Friday. Please note that voicemails left after 4:00 p.m. may not be returned until the following business day.  We are closed weekends and major holidays. You have access to a nurse at all times for urgent questions. Please call the main number to the clinic [phone] and follow the prompts.  For any non-urgent questions, you may also contact your provider using MyChart. We now offer e-Visits for anyone 65 and older to request care online for non-urgent symptoms. For details visit [email or web address].   Also download the MyChart app! Go to the app store, search "MyChart", open the app, select [place], and log in with your MyChart username and password.  Masks are optional in the cancer centers. If you would like for your care team to wear a mask while they are taking care of you, please let them know. For doctor visits, patients may have with them one support person who is at least 66 years old. At this time, visitors are not allowed in the infusion area.

## 2021-10-21 ENCOUNTER — Inpatient Hospital Stay: Payer: Medicare HMO

## 2021-10-21 VITALS — BP 120/69 | HR 72 | Temp 97.9°F | Resp 16

## 2021-10-21 DIAGNOSIS — Z5111 Encounter for antineoplastic chemotherapy: Secondary | ICD-10-CM | POA: Diagnosis not present

## 2021-10-21 DIAGNOSIS — Z79899 Other long term (current) drug therapy: Secondary | ICD-10-CM | POA: Diagnosis not present

## 2021-10-21 DIAGNOSIS — C3490 Malignant neoplasm of unspecified part of unspecified bronchus or lung: Secondary | ICD-10-CM

## 2021-10-21 DIAGNOSIS — C7931 Secondary malignant neoplasm of brain: Secondary | ICD-10-CM | POA: Diagnosis not present

## 2021-10-21 DIAGNOSIS — D6481 Anemia due to antineoplastic chemotherapy: Secondary | ICD-10-CM | POA: Diagnosis not present

## 2021-10-21 DIAGNOSIS — C786 Secondary malignant neoplasm of retroperitoneum and peritoneum: Secondary | ICD-10-CM | POA: Diagnosis not present

## 2021-10-21 DIAGNOSIS — C3431 Malignant neoplasm of lower lobe, right bronchus or lung: Secondary | ICD-10-CM | POA: Diagnosis not present

## 2021-10-21 DIAGNOSIS — R630 Anorexia: Secondary | ICD-10-CM | POA: Diagnosis not present

## 2021-10-21 DIAGNOSIS — E876 Hypokalemia: Secondary | ICD-10-CM | POA: Diagnosis not present

## 2021-10-21 DIAGNOSIS — Z5112 Encounter for antineoplastic immunotherapy: Secondary | ICD-10-CM | POA: Diagnosis not present

## 2021-10-21 MED ORDER — SODIUM CHLORIDE 0.9 % IV SOLN
10.0000 mg | Freq: Once | INTRAVENOUS | Status: AC
  Administered 2021-10-21: 10 mg via INTRAVENOUS
  Filled 2021-10-21: qty 10

## 2021-10-21 MED ORDER — SODIUM CHLORIDE 0.9 % IV SOLN
95.6000 mg/m2 | Freq: Once | INTRAVENOUS | Status: AC
  Administered 2021-10-21: 200 mg via INTRAVENOUS
  Filled 2021-10-21: qty 10

## 2021-10-21 MED ORDER — HEPARIN SOD (PORK) LOCK FLUSH 100 UNIT/ML IV SOLN
INTRAVENOUS | Status: AC
  Administered 2021-10-21: 500 [IU]
  Filled 2021-10-21: qty 5

## 2021-10-21 MED ORDER — SODIUM CHLORIDE 0.9 % IV SOLN
Freq: Once | INTRAVENOUS | Status: AC
  Filled 2021-10-21: qty 250

## 2021-10-21 NOTE — Progress Notes (Signed)
Nutrition Follow-up:   Patient with small cell lung cancer with brain, omentum, pancreatic, and adrenal metastases.  Completed whole brain radiation on 7/21.  Patient on carboplatin, etoposide, tecentriq   Met with patient and wife during infusion. Patient reports no appetite, constipated from pain meds after recent fall, last bowel movement Saturday.  He has Boost at home 1.0 calorie (given to them) drinks 1-2/day but not daily.  Breakfast 2 sausage biscuits this morning, wife made 2 chicken and rice but didn't eat much. He reports would rather have comfort foods than ONS.  He also states he doesn't like vegetables (corn and carrots) but he does like beans and greens.   He usually drinks water and body armor but only about quart a day total, likes hot chocolate as well.     Medications: Just got his Marinol Rx filled was out for ~week, has stool softener and Miralax at home will take tonight   Labs: reviewed   Anthropometrics: gradual weight loss 30# (14%) past 90 days   Weight  182#  10/19/21 186.8#  09/29/21    200 lb 4.8 oz 8/22 211 lb on 7/24 212 lb on 7/12  UBW of 224-227 lb ,  DBW reported today 185#     NUTRITION DIAGNOSIS: Inadequate oral intake ongoing     INTERVENTION:  High protein high calorie snacks between meals with frequent feeds Provided recipe book, highly endorsed Chocolate peanut butter pudding, peanut butter & crackers, high protein peanut butter spread Resume Marinol  Increase fluids and hot liquids to aid with constipation (hot chocolate w/whole milk) If no BM in 2 days start laxative stool softener Provided contact info.   MONITORING, EVALUATION, GOAL: weight trends, intake     NEXT VISIT: remote follow up 2 weeks

## 2021-10-21 NOTE — Patient Instructions (Signed)
[facility] CANCER CTR AT [place]  Discharge Instructions: Thank you for choosing [place] to provide your oncology and hematology care.  If you have a lab appointment with the [place], please go directly to the [place] and check in at the registration area.  Wear comfortable clothing and clothing appropriate for easy access to any Portacath or PICC line.   We strive to give you quality time with your provider. You may need to reschedule your appointment if you arrive late (15 or more minutes).  Arriving late affects you and other patients whose appointments are after yours.  Also, if you miss three or more appointments without notifying the office, you may be dismissed from the clinic at the provider's discretion.      For prescription refill requests, have your pharmacy contact our office and allow 72 hours for refills to be completed.    Today you received the following chemotherapy and/or immunotherapy agents Etoposide.      To help prevent nausea and vomiting after your treatment, we encourage you to take your nausea medication as directed.  BELOW ARE SYMPTOMS THAT SHOULD BE REPORTED IMMEDIATELY: *FEVER GREATER THAN 100.4 F (38 C) OR HIGHER *CHILLS OR SWEATING *NAUSEA AND VOMITING THAT IS NOT CONTROLLED WITH YOUR NAUSEA MEDICATION *UNUSUAL SHORTNESS OF BREATH *UNUSUAL BRUISING OR BLEEDING *URINARY PROBLEMS (pain or burning when urinating, or frequent urination) *BOWEL PROBLEMS (unusual diarrhea, constipation, pain near the anus) TENDERNESS IN MOUTH AND THROAT WITH OR WITHOUT PRESENCE OF ULCERS (sore throat, sores in mouth, or a toothache) UNUSUAL RASH, SWELLING OR PAIN  UNUSUAL VAGINAL DISCHARGE OR ITCHING   Items with * indicate a potential emergency and should be followed up as soon as possible or go to the Emergency Department if any problems should occur.  Please show the CHEMOTHERAPY ALERT CARD or IMMUNOTHERAPY ALERT CARD at check-in to  the Emergency Department and triage nurse.  Should you have questions after your visit or need to cancel or reschedule your appointment, please contact [facility] CANCER [place] AT [place]  [phone] and follow the prompts.  Office hours are 8:00 a.m. to 4:30 p.m. Monday - Friday. Please note that voicemails left after 4:00 p.m. may not be returned until the following business day.  We are closed weekends and major holidays. You have access to a nurse at all times for urgent questions. Please call the main number to the clinic [phone] and follow the prompts.  For any non-urgent questions, you may also contact your provider using MyChart. We now offer e-Visits for anyone 66 and older to request care online for non-urgent symptoms. For details visit [email or web address].   Also download the MyChart app! Go to the app store, search "MyChart", open the app, select [place], and log in with your MyChart username and password.  Masks are optional in the cancer centers. If you would like for your care team to wear a mask while they are taking care of you, please let them know. For doctor visits, patients may have with them one support person who is at least 66 years old. At this time, visitors are not allowed in the infusion area.

## 2021-10-22 ENCOUNTER — Ambulatory Visit: Payer: Medicare HMO

## 2021-10-23 ENCOUNTER — Inpatient Hospital Stay: Payer: Medicare HMO

## 2021-10-23 VITALS — BP 98/63 | HR 71 | Resp 16

## 2021-10-23 DIAGNOSIS — E876 Hypokalemia: Secondary | ICD-10-CM | POA: Diagnosis not present

## 2021-10-23 DIAGNOSIS — C3490 Malignant neoplasm of unspecified part of unspecified bronchus or lung: Secondary | ICD-10-CM

## 2021-10-23 DIAGNOSIS — Z5111 Encounter for antineoplastic chemotherapy: Secondary | ICD-10-CM | POA: Diagnosis not present

## 2021-10-23 DIAGNOSIS — C7931 Secondary malignant neoplasm of brain: Secondary | ICD-10-CM | POA: Diagnosis not present

## 2021-10-23 DIAGNOSIS — Z5112 Encounter for antineoplastic immunotherapy: Secondary | ICD-10-CM | POA: Diagnosis not present

## 2021-10-23 DIAGNOSIS — C786 Secondary malignant neoplasm of retroperitoneum and peritoneum: Secondary | ICD-10-CM | POA: Diagnosis not present

## 2021-10-23 DIAGNOSIS — C3431 Malignant neoplasm of lower lobe, right bronchus or lung: Secondary | ICD-10-CM | POA: Diagnosis not present

## 2021-10-23 DIAGNOSIS — Z79899 Other long term (current) drug therapy: Secondary | ICD-10-CM | POA: Diagnosis not present

## 2021-10-23 DIAGNOSIS — R630 Anorexia: Secondary | ICD-10-CM | POA: Diagnosis not present

## 2021-10-23 DIAGNOSIS — D6481 Anemia due to antineoplastic chemotherapy: Secondary | ICD-10-CM | POA: Diagnosis not present

## 2021-10-23 MED ORDER — PEGFILGRASTIM-CBQV 6 MG/0.6ML ~~LOC~~ SOSY
6.0000 mg | PREFILLED_SYRINGE | Freq: Once | SUBCUTANEOUS | Status: AC
  Administered 2021-10-23: 6 mg via SUBCUTANEOUS
  Filled 2021-10-23: qty 0.6

## 2021-10-23 MED ORDER — SODIUM CHLORIDE 0.9 % IV SOLN
INTRAVENOUS | Status: DC
  Filled 2021-10-23: qty 250

## 2021-10-23 MED ORDER — HEPARIN SOD (PORK) LOCK FLUSH 100 UNIT/ML IV SOLN
500.0000 [IU] | Freq: Once | INTRAVENOUS | Status: AC | PRN
  Administered 2021-10-23: 500 [IU]
  Filled 2021-10-23: qty 5

## 2021-10-23 MED ORDER — HEPARIN SOD (PORK) LOCK FLUSH 100 UNIT/ML IV SOLN
INTRAVENOUS | Status: AC
  Filled 2021-10-23: qty 5

## 2021-10-23 MED ORDER — SODIUM CHLORIDE 0.9% FLUSH
10.0000 mL | Freq: Once | INTRAVENOUS | Status: AC | PRN
  Administered 2021-10-23: 10 mL
  Filled 2021-10-23: qty 10

## 2021-10-23 NOTE — Patient Instructions (Signed)

## 2021-10-26 ENCOUNTER — Encounter: Payer: Self-pay | Admitting: Oncology

## 2021-10-27 ENCOUNTER — Inpatient Hospital Stay: Payer: Medicare HMO

## 2021-10-27 ENCOUNTER — Inpatient Hospital Stay (HOSPITAL_BASED_OUTPATIENT_CLINIC_OR_DEPARTMENT_OTHER): Payer: Medicare HMO | Admitting: Nurse Practitioner

## 2021-10-27 ENCOUNTER — Other Ambulatory Visit: Payer: Self-pay

## 2021-10-27 VITALS — BP 113/77 | HR 86 | Temp 97.8°F | Resp 18

## 2021-10-27 VITALS — BP 108/65 | HR 80 | Resp 18

## 2021-10-27 DIAGNOSIS — E876 Hypokalemia: Secondary | ICD-10-CM

## 2021-10-27 DIAGNOSIS — D6481 Anemia due to antineoplastic chemotherapy: Secondary | ICD-10-CM | POA: Diagnosis not present

## 2021-10-27 DIAGNOSIS — Z5112 Encounter for antineoplastic immunotherapy: Secondary | ICD-10-CM | POA: Diagnosis not present

## 2021-10-27 DIAGNOSIS — R110 Nausea: Secondary | ICD-10-CM

## 2021-10-27 DIAGNOSIS — C786 Secondary malignant neoplasm of retroperitoneum and peritoneum: Secondary | ICD-10-CM | POA: Diagnosis not present

## 2021-10-27 DIAGNOSIS — T451X5A Adverse effect of antineoplastic and immunosuppressive drugs, initial encounter: Secondary | ICD-10-CM | POA: Diagnosis not present

## 2021-10-27 DIAGNOSIS — Z5111 Encounter for antineoplastic chemotherapy: Secondary | ICD-10-CM | POA: Diagnosis not present

## 2021-10-27 DIAGNOSIS — R5383 Other fatigue: Secondary | ICD-10-CM

## 2021-10-27 DIAGNOSIS — C7931 Secondary malignant neoplasm of brain: Secondary | ICD-10-CM | POA: Diagnosis not present

## 2021-10-27 DIAGNOSIS — C3431 Malignant neoplasm of lower lobe, right bronchus or lung: Secondary | ICD-10-CM | POA: Diagnosis not present

## 2021-10-27 DIAGNOSIS — Z79899 Other long term (current) drug therapy: Secondary | ICD-10-CM | POA: Diagnosis not present

## 2021-10-27 DIAGNOSIS — R630 Anorexia: Secondary | ICD-10-CM | POA: Diagnosis not present

## 2021-10-27 DIAGNOSIS — C3490 Malignant neoplasm of unspecified part of unspecified bronchus or lung: Secondary | ICD-10-CM

## 2021-10-27 LAB — CBC WITH DIFFERENTIAL/PLATELET
Abs Immature Granulocytes: 0.03 10*3/uL (ref 0.00–0.07)
Basophils Absolute: 0 10*3/uL (ref 0.0–0.1)
Basophils Relative: 1 %
Eosinophils Absolute: 0 10*3/uL (ref 0.0–0.5)
Eosinophils Relative: 0 %
HCT: 24.1 % — ABNORMAL LOW (ref 39.0–52.0)
Hemoglobin: 8.1 g/dL — ABNORMAL LOW (ref 13.0–17.0)
Immature Granulocytes: 1 %
Lymphocytes Relative: 57 %
Lymphs Abs: 1.3 10*3/uL (ref 0.7–4.0)
MCH: 31.9 pg (ref 26.0–34.0)
MCHC: 33.6 g/dL (ref 30.0–36.0)
MCV: 94.9 fL (ref 80.0–100.0)
Monocytes Absolute: 0 10*3/uL — ABNORMAL LOW (ref 0.1–1.0)
Monocytes Relative: 1 %
Neutro Abs: 0.9 10*3/uL — ABNORMAL LOW (ref 1.7–7.7)
Neutrophils Relative %: 40 %
Platelets: 87 10*3/uL — ABNORMAL LOW (ref 150–400)
RBC: 2.54 MIL/uL — ABNORMAL LOW (ref 4.22–5.81)
RDW: 14.7 % (ref 11.5–15.5)
Smear Review: NORMAL
WBC: 2.3 10*3/uL — ABNORMAL LOW (ref 4.0–10.5)
nRBC: 0 % (ref 0.0–0.2)

## 2021-10-27 LAB — COMPREHENSIVE METABOLIC PANEL
ALT: 74 U/L — ABNORMAL HIGH (ref 0–44)
AST: 46 U/L — ABNORMAL HIGH (ref 15–41)
Albumin: 3.1 g/dL — ABNORMAL LOW (ref 3.5–5.0)
Alkaline Phosphatase: 113 U/L (ref 38–126)
Anion gap: 6 (ref 5–15)
BUN: 15 mg/dL (ref 8–23)
CO2: 27 mmol/L (ref 22–32)
Calcium: 8.8 mg/dL — ABNORMAL LOW (ref 8.9–10.3)
Chloride: 104 mmol/L (ref 98–111)
Creatinine, Ser: 0.76 mg/dL (ref 0.61–1.24)
GFR, Estimated: >60 mL/min (ref >60)
Glucose, Bld: 116 mg/dL — ABNORMAL HIGH (ref 70–99)
Potassium: 3.3 mmol/L — ABNORMAL LOW (ref 3.5–5.1)
Sodium: 137 mmol/L (ref 135–145)
Total Bilirubin: 0.9 mg/dL (ref 0.3–1.2)
Total Protein: 6.9 g/dL (ref 6.5–8.1)

## 2021-10-27 LAB — MAGNESIUM: Magnesium: 1.8 mg/dL (ref 1.7–2.4)

## 2021-10-27 MED ORDER — DEXAMETHASONE 4 MG PO TABS: 4.0000 mg | ORAL_TABLET | Freq: Every day | ORAL | 0 refills | Status: AC

## 2021-10-27 MED ORDER — ONDANSETRON HCL 4 MG/2ML IJ SOLN
8.0000 mg | Freq: Once | INTRAMUSCULAR | Status: AC
  Administered 2021-10-27: 8 mg via INTRAVENOUS
  Filled 2021-10-27: qty 4

## 2021-10-27 MED ORDER — SODIUM CHLORIDE 0.9 % IV SOLN: 8.0000 mg | Freq: Once | INTRAVENOUS | Status: DC

## 2021-10-27 MED ORDER — HEPARIN SOD (PORK) LOCK FLUSH 100 UNIT/ML IV SOLN
500.0000 [IU] | Freq: Once | INTRAVENOUS | Status: AC | PRN
  Administered 2021-10-27: 500 [IU]
  Filled 2021-10-27: qty 5

## 2021-10-27 MED ORDER — SODIUM CHLORIDE 0.9 % IV SOLN
INTRAVENOUS | Status: DC
  Filled 2021-10-27: qty 250

## 2021-10-27 MED ORDER — POTASSIUM CHLORIDE 20 MEQ/100ML IV SOLN
20.0000 meq | Freq: Once | INTRAVENOUS | Status: AC
  Administered 2021-10-27: 20 meq via INTRAVENOUS

## 2021-10-27 MED ORDER — MULTI GINSENG 1000 MG PO CAPS: 1000.0000 mg | ORAL_CAPSULE | Freq: Two times a day (BID) | ORAL | 2 refills | Status: DC

## 2021-10-27 MED ORDER — SODIUM CHLORIDE 0.9% FLUSH
10.0000 mL | Freq: Once | INTRAVENOUS | Status: AC | PRN
  Administered 2021-10-27: 10 mL
  Filled 2021-10-27: qty 10

## 2021-10-27 NOTE — Progress Notes (Signed)
Symptom Management [place] [facility]. [facility] [address], [place], [place] [address] [phone] (phone) [phone] (fax)  Patient Care Team: [name], NP as PCP - General (Nurse Practitioner) Telford Nab, RN as Oncology Nurse Navigator   Name of the patient: Maurice Simpson  [id number]  1955/09/14   Date of visit: 10/27/21  Diagnosis- Lung cancer  Chief complaint/ Reason for visit- nausea and weakness  Heme/Onc history:  Oncology History  Small cell lung cancer ([place])  07/21/2021 Initial Diagnosis   Small cell lung cancer ([place])   07/21/2021 Cancer Staging   Staging form: Lung, AJCC 8th Edition - Clinical stage from 07/21/2021: Stage IVB (cT2, cN0, pM1c) - Signed by Sindy Guadeloupe, MD on 07/21/2021   08/03/2021 - 09/01/2021 Chemotherapy   Patient is on Treatment Plan : LUNG SCLC Carboplatin + Etoposide + Atezolizumab Induction q21d / Atezolizumab Maintenance q21d     09/01/2021 -  Chemotherapy   Patient is on Treatment Plan : LUNG SCLC Carboplatin + Etoposide + Atezolizumab Induction q21d x 4 cycles / Atezolizumab Maintenance q21d       Interval history-patient is 66 year old male with history of small cell lung cancer currently status post 4 cycles of carbo-etoposide-Tecentriq who presents to symptom management clinic at request of his wife for nausea and weakness.  She is concerned about previous falls and ongoing weakness.  He has started Chantix and she has noticed that he is reporting ongoing nausea.  Has cut down on his smoking to 1 cigarette a day.  ECOG FS:3 - Symptomatic, >50% confined to bed  Review of systems- Review of Systems  Constitutional:  Positive for malaise/fatigue and weight loss. Negative for chills and fever.  HENT:  Negative for hearing loss, nosebleeds, sore throat and tinnitus.   Eyes:  Negative for blurred vision and double vision.   Respiratory:  Negative for cough, hemoptysis, shortness of breath and wheezing.   Cardiovascular:  Negative for chest pain, palpitations and leg swelling.  Gastrointestinal:  Positive for nausea. Negative for abdominal pain, blood in stool, constipation, diarrhea, melena and vomiting.  Genitourinary:  Negative for dysuria and urgency.  Musculoskeletal:  Negative for back pain, falls, joint pain and myalgias.  Skin:  Negative for itching and rash.  Neurological:  Negative for dizziness, tingling, sensory change, loss of consciousness, weakness and headaches.  Endo/Heme/Allergies:  Negative for environmental allergies. Does not bruise/bleed easily.  Psychiatric/Behavioral:  Negative for depression. The patient is not nervous/anxious and does not have insomnia.      Allergies  Allergen Reactions   Bee Venom     Past Medical History:  Diagnosis Date   Cancer ([place])    COPD (chronic obstructive pulmonary disease) ([place])    Dyspnea     Past Surgical History:  Procedure Laterality Date   CATARACT EXTRACTION Right 03/10/2021   IR IMAGING GUIDED PORT INSERTION  07/27/2021    Social History   Socioeconomic History   Marital status: Married    Spouse name: Not on file   Number of children: Not on file   Years of education: Not on file   Highest education level: Not on file  Occupational History   Not on file  Tobacco Use   Smoking status: Every Day    Packs/day: 2.00    Years: 51.00    Total pack years: 102.00    Types: Cigarettes   Smokeless tobacco: Never  Vaping Use   Vaping Use: Never used  Substance and Sexual Activity   Alcohol use: Not Currently   Drug use: Never   Sexual activity: Yes  Other Topics Concern   Not on file  Social History Narrative   Not on file   Social Determinants of Health   Financial Resource Strain: Low Risk  (07/13/2021)   Overall Financial Resource Strain (CARDIA)    Difficulty of Paying Living Expenses: Not hard at all  Food Insecurity:  No Food Insecurity (07/13/2021)   Hunger Vital Sign    Worried About Running Out of Food in the Last Year: Never true    Ran Out of Food in the Last Year: Never true  Transportation Needs: No Transportation Needs (07/13/2021)   PRAPARE - Hydrologist (Medical): No    Lack of Transportation (Non-Medical): No  Physical Activity: Inactive (07/20/2019)   Exercise Vital Sign    Days of Exercise per Week: 0 days    Minutes of Exercise per Session: 0 min  Stress: Stress Concern Present (07/13/2021)   [place]    Feeling of Stress : To some extent  Social Connections: Moderately Isolated (07/13/2021)   Social Connection and Isolation Panel [NHANES]    Frequency of Communication with Friends and Family: More than three times a week    Frequency of Social Gatherings with Friends and Family: Twice a week    Attends Religious Services: Never    Marine scientist or Organizations: No    Attends Archivist Meetings: Never    Marital Status: Married  Human resources officer Violence: Not At Risk (07/13/2021)   Humiliation, Afraid, Rape, and Kick questionnaire    Fear of Current or Ex-Partner: No    Emotionally Abused: No    Physically Abused: No    Sexually Abused: No    Family History  Problem Relation Age of Onset   Diabetes Mother    Hypertension Mother    Diabetes Father    Hypertension Father    Diabetes Sister    Cancer - Colon Cousin      Current Outpatient Medications:    clotrimazole (LOTRIMIN) 1 % external solution, SMARTSIG:3-4 Drop(s) In Ear(s) Twice Daily, Disp: , Rfl:    Docusate Sodium (COLACE PO), Take 1 capsule by mouth as needed (constipation)., Disp: , Rfl:    loperamide (IMODIUM) 2 MG capsule, Take 2 mg by mouth as needed for diarrhea or loose stools., Disp: , Rfl:    NEOMYCIN-POLYMYXIN-HYDROCORTISONE (CORTISPORIN) 1 % SOLN OTIC solution, SMARTSIG:3-4 Drop(s) In Ear(s) Twice  Daily, Disp: , Rfl:    nicotine (NICOTROL) 10 MG inhaler, Inhale 1 Cartridge (1 continuous puffing total) into the lungs as needed for smoking cessation., Disp: 42 each, Rfl: 1   ondansetron (ZOFRAN) 4 MG tablet, Take 1 tablet (4 mg total) by mouth every 8 (eight) hours as needed for nausea or vomiting., Disp: 60 tablet, Rfl: 3   potassium chloride SA (KLOR-CON M) 20 MEQ tablet, Take 1 tablet (20 mEq total) by mouth 2 (two) times daily for 7 days., Disp: 14 tablet, Rfl: 0   prochlorperazine (COMPAZINE) 10 MG tablet, Take 1 tablet (10 mg total) by mouth every 6 (six) hours as needed for nausea or vomiting., Disp: 60 tablet, Rfl: 2   albuterol (PROVENTIL) (2.5 MG/3ML) 0.083% nebulizer solution, Take 3 mLs (2.5 mg total) by nebulization every 6 (six) hours as needed for wheezing or shortness  of breath. (Patient not taking: Reported on 10/27/2021), Disp: 75 mL, Rfl: 12   budesonide (PULMICORT) 0.25 MG/2ML nebulizer solution, Take 2 mLs (0.25 mg total) by nebulization 2 (two) times daily as needed. (Patient not taking: Reported on 10/27/2021), Disp: 60 mL, Rfl: 12   dexamethasone (DECADRON) 4 MG tablet, Take 1 tablet (4 mg total) by mouth daily., Disp: 5 tablet, Rfl: 0   dronabinol (MARINOL) 5 MG capsule, Take 1 capsule (5 mg total) by mouth 2 (two) times daily before lunch and supper., Disp: 30 capsule, Rfl: 0   lidocaine-prilocaine (EMLA) cream, Apply 1 Application topically as needed. Apply to [facility] a cath prior to access, Disp: , Rfl:    nystatin (MYCOSTATIN) 100000 UNIT/ML suspension, Take 5 mLs (500,000 Units total) by mouth 3 (three) times daily., Disp: 180 mL, Rfl: 0   OLANZapine (ZYPREXA) 10 MG tablet, TAKE 1 TABLET BY MOUTH EVERYDAY AT BEDTIME, Disp: 90 tablet, Rfl: 1   oxyCODONE (OXY IR/ROXICODONE) 5 MG immediate release tablet, Take 1 tablet (5 mg total) by mouth every 4 (four) hours as needed for severe pain., Disp: 60 tablet, Rfl: 0   pantoprazole (PROTONIX) 20 MG tablet, TAKE 1 TABLET BY MOUTH  EVERY DAY, Disp: 90 tablet, Rfl: 0   rosuvastatin (CRESTOR) 10 MG tablet, Take 10 mg by mouth daily., Disp: , Rfl:    traZODone (DESYREL) 50 MG tablet, TAKE 1 TABLET BY MOUTH AT BEDTIME AS NEEDED FOR SLEEP. (Patient not taking: Reported on 09/01/2021), Disp: 90 tablet, Rfl: 1   valACYclovir (VALTREX) 1000 MG tablet, Take 1 tablet (1,000 mg total) by mouth daily., Disp: 10 tablet, Rfl: 10   Varenicline Tartrate, Starter, (CHANTIX STARTING MONTH PAK) 0.5 MG X 11 & 1 MG X 42 TBPK, Take 0.5mg  tablet by mouth every morning for 3 days, THEN 0.5 mg tablet by mouth 2 (two) times daily for 4 days, THEN 1mg  tablet by mouth 2 (two) times daily, Disp: 53 each, Rfl: 0 No current facility-administered medications for this visit.  Facility-Administered Medications Ordered in Other Visits:    0.9 %  sodium chloride infusion, , Intravenous, Weekly, Sindy Guadeloupe, MD, Last Rate: 999 mL/hr at 10/27/21 0909, New Bag at 10/27/21 0909   heparin lock flush 100 unit/mL, 500 Units, Intracatheter, Once PRN, Sindy Guadeloupe, MD   potassium chloride 20 mEq in 100 mL IVPB, 20 mEq, Intravenous, Once, Verlon Au, NP, Last Rate: 100 mL/hr at 10/27/21 1003, 20 mEq at 10/27/21 1003  Physical exam:  Vitals:   10/27/21 0845  BP: 113/77  Pulse: 86  Resp: 18  Temp: 97.8 F (36.6 C)  TempSrc: Tympanic   Physical Exam Vitals reviewed.  Constitutional:      Appearance: He is ill-appearing.  Pulmonary:     Effort: No respiratory distress.  Skin:    Coloration: Skin is not jaundiced or pale.  Neurological:     Mental Status: He is alert and oriented to person, place, and time. Mental status is at baseline.  Psychiatric:     Comments: Flat affect         Latest Ref Rng & Units 10/27/2021    9:11 AM  CMP  Glucose 70 - 99 mg/dL 116   BUN 8 - 23 mg/dL 15   Creatinine 0.61 - 1.24 mg/dL 0.76   Sodium 135 - 145 mmol/L 137   Potassium 3.5 - 5.1 mmol/L 3.3   Chloride 98 - 111 mmol/L 104   CO2 22 - 32 mmol/L 27  Calcium 8.9 - 10.3 mg/dL 8.8   Total Protein 6.5 - 8.1 g/dL 6.9   Total Bilirubin 0.3 - 1.2 mg/dL 0.9   Alkaline Phos 38 - 126 U/L 113   AST 15 - 41 U/L 46   ALT 0 - 44 U/L 74       Latest Ref Rng & Units 10/27/2021    9:11 AM  CBC  WBC 4.0 - 10.5 K/uL 2.3   Hemoglobin 13.0 - 17.0 g/dL 8.1   Hematocrit 39.0 - 52.0 % 24.1   Platelets 150 - 400 K/uL 87    No images are attached to the encounter.  No results found.  Assessment and plan- Patient is a 66 y.o. male diagnosed with extensive stage small cell lung cancer with   Nausea- unclear if related to chemotherapy vs chantix. Recommend he take olanzapine at night as prescribed. He hasn't started this medication. Reviewed that he can take zofran concurrently if needed. Hold chantix for now Weakness- s/p fall. Encouraged nutrition. Wife requesting steroids. Reviewed possible interaction with tecentriq. Patient and wife requesting steroids. Reviewed that he had large disease burden, and don't recommend steroids. Previous scan showed improvement in disease burden. Will give 3 days of dexamethasone 4 mg then recommend starting american ginseng 1000 mg twice a day. In clinical trials, ginseng has shown improvement in chemotherapy induced fatigue.  Smoking cessation- improved with chantix. Down to 1 cigarette a day. However, appears to be suffering side effect of medication. Hold chantix fo rnow. May consider lower dose in the future.   Follow up as needed.    Visit Diagnosis 1. Chemotherapy-induced fatigue     Patient expressed understanding and was in agreement with this plan. He also understands that He can call clinic at any time with any questions, concerns, or complaints.   Thank you for allowing me to participate in the care of this very pleasant patient.   Beckey Rutter, DNP, AGNP-C [place] [facility] [place]  CC:

## 2021-10-27 NOTE — Progress Notes (Signed)
Feeling nauseated, weak. Started feeling bad on Sunday with nausea, no vomitting. Just feels weak, bad. Recvd IV hydration and potassium replacement. Gave pt nausea medicine as well. Added on to [facility] provider schedule as well. Instructed to stop chantix. Pick up steroid prescription from pharmacy. Return Friday for IVF's as already scheduled.

## 2021-10-30 ENCOUNTER — Inpatient Hospital Stay: Payer: Medicare HMO

## 2021-11-02 ENCOUNTER — Ambulatory Visit
Admission: RE | Admit: 2021-11-02 | Discharge: 2021-11-02 | Disposition: A | Discharge status: Home / Self Care | Payer: Medicare HMO | Source: Ambulatory Visit | Attending: Oncology | Admitting: Oncology

## 2021-11-02 ENCOUNTER — Ambulatory Visit: Payer: Medicare HMO

## 2021-11-02 DIAGNOSIS — C3490 Malignant neoplasm of unspecified part of unspecified bronchus or lung: Secondary | ICD-10-CM | POA: Diagnosis not present

## 2021-11-02 DIAGNOSIS — G9389 Other specified disorders of brain: Secondary | ICD-10-CM | POA: Diagnosis not present

## 2021-11-02 MED ORDER — GADOBUTROL 1 MMOL/ML IV SOLN
8.0000 mL | Freq: Once | INTRAVENOUS | Status: AC | PRN
  Administered 2021-11-02: 8 mL via INTRAVENOUS

## 2021-11-03 ENCOUNTER — Inpatient Hospital Stay: Payer: Medicare HMO

## 2021-11-03 ENCOUNTER — Inpatient Hospital Stay (HOSPITAL_BASED_OUTPATIENT_CLINIC_OR_DEPARTMENT_OTHER): Payer: Medicare HMO | Admitting: Nurse Practitioner

## 2021-11-03 VITALS — BP 114/60 | HR 100 | Temp 97.1°F | Resp 18

## 2021-11-03 VITALS — BP 114/60 | HR 100 | Temp 97.1°F | Resp 17

## 2021-11-03 DIAGNOSIS — C7931 Secondary malignant neoplasm of brain: Secondary | ICD-10-CM | POA: Diagnosis not present

## 2021-11-03 DIAGNOSIS — C786 Secondary malignant neoplasm of retroperitoneum and peritoneum: Secondary | ICD-10-CM | POA: Diagnosis not present

## 2021-11-03 DIAGNOSIS — Z79899 Other long term (current) drug therapy: Secondary | ICD-10-CM | POA: Diagnosis not present

## 2021-11-03 DIAGNOSIS — E860 Dehydration: Secondary | ICD-10-CM

## 2021-11-03 DIAGNOSIS — R64 Cachexia: Secondary | ICD-10-CM | POA: Diagnosis not present

## 2021-11-03 DIAGNOSIS — C3431 Malignant neoplasm of lower lobe, right bronchus or lung: Secondary | ICD-10-CM | POA: Diagnosis not present

## 2021-11-03 DIAGNOSIS — C3490 Malignant neoplasm of unspecified part of unspecified bronchus or lung: Secondary | ICD-10-CM

## 2021-11-03 DIAGNOSIS — Z5112 Encounter for antineoplastic immunotherapy: Secondary | ICD-10-CM | POA: Diagnosis not present

## 2021-11-03 DIAGNOSIS — R630 Anorexia: Secondary | ICD-10-CM | POA: Diagnosis not present

## 2021-11-03 DIAGNOSIS — E876 Hypokalemia: Secondary | ICD-10-CM | POA: Insufficient documentation

## 2021-11-03 DIAGNOSIS — D6481 Anemia due to antineoplastic chemotherapy: Secondary | ICD-10-CM | POA: Diagnosis not present

## 2021-11-03 DIAGNOSIS — Z5111 Encounter for antineoplastic chemotherapy: Secondary | ICD-10-CM | POA: Diagnosis not present

## 2021-11-03 LAB — CBC WITH DIFFERENTIAL/PLATELET
Abs Immature Granulocytes: 0.92 10*3/uL — ABNORMAL HIGH (ref 0.00–0.07)
Band Neutrophils: 0 %
Basophils Absolute: 0 10*3/uL (ref 0.0–0.1)
Basophils Relative: 0 %
Blasts: 0 %
Eosinophils Absolute: 0.2 10*3/uL (ref 0.0–0.5)
Eosinophils Relative: 1 %
HCT: 25.9 % — ABNORMAL LOW (ref 39.0–52.0)
Hemoglobin: 8.8 g/dL — ABNORMAL LOW (ref 13.0–17.0)
Lymphocytes Relative: 22 %
Lymphs Abs: 3.4 10*3/uL (ref 0.7–4.0)
MCH: 32.5 pg (ref 26.0–34.0)
MCHC: 34 g/dL (ref 30.0–36.0)
MCV: 95.6 fL (ref 80.0–100.0)
Metamyelocytes Relative: 2 %
Monocytes Absolute: 1.1 10*3/uL — ABNORMAL HIGH (ref 0.1–1.0)
Monocytes Relative: 7 %
Myelocytes: 4 %
Neutro Abs: 9.8 10*3/uL — ABNORMAL HIGH (ref 1.7–7.7)
Neutrophils Relative %: 64 %
Other: 0 %
Platelets: 73 10*3/uL — ABNORMAL LOW (ref 150–400)
Promyelocytes Relative: 0 %
RBC: 2.71 MIL/uL — ABNORMAL LOW (ref 4.22–5.81)
RDW: 16.1 % — ABNORMAL HIGH (ref 11.5–15.5)
Smear Review: DECREASED
WBC: 15.4 10*3/uL — ABNORMAL HIGH (ref 4.0–10.5)
nRBC: 0 /100 WBC
nRBC: 0.9 % — ABNORMAL HIGH (ref 0.0–0.2)

## 2021-11-03 LAB — BASIC METABOLIC PANEL
Anion gap: 11 (ref 5–15)
BUN: 10 mg/dL (ref 8–23)
CO2: 25 mmol/L (ref 22–32)
Calcium: 8.9 mg/dL (ref 8.9–10.3)
Chloride: 101 mmol/L (ref 98–111)
Creatinine, Ser: 0.97 mg/dL (ref 0.61–1.24)
GFR, Estimated: >60 mL/min (ref >60)
Glucose, Bld: 137 mg/dL — ABNORMAL HIGH (ref 70–99)
Potassium: 2.6 mmol/L — CL (ref 3.5–5.1)
Sodium: 137 mmol/L (ref 135–145)

## 2021-11-03 LAB — MAGNESIUM: Magnesium: 1.8 mg/dL (ref 1.7–2.4)

## 2021-11-03 MED ORDER — HEPARIN SOD (PORK) LOCK FLUSH 100 UNIT/ML IV SOLN
500.0000 [IU] | Freq: Once | INTRAVENOUS | Status: AC | PRN
  Administered 2021-11-03: 500 [IU]
  Filled 2021-11-03: qty 5

## 2021-11-03 MED ORDER — POTASSIUM CHLORIDE 20 MEQ/100ML IV SOLN
20.0000 meq | Freq: Once | INTRAVENOUS | Status: AC
  Administered 2021-11-03: 20 meq via INTRAVENOUS

## 2021-11-03 MED ORDER — SODIUM CHLORIDE 0.9% FLUSH
10.0000 mL | Freq: Once | INTRAVENOUS | Status: AC | PRN
  Administered 2021-11-03: 10 mL
  Filled 2021-11-03: qty 10

## 2021-11-03 MED ORDER — MIRTAZAPINE 7.5 MG PO TABS: 7.5000 mg | ORAL_TABLET | Freq: Every day | ORAL | 2 refills | Status: DC

## 2021-11-03 MED ORDER — SODIUM CHLORIDE 0.9 % IV SOLN
INTRAVENOUS | Status: DC
  Filled 2021-11-03: qty 250

## 2021-11-03 MED ORDER — POTASSIUM CHLORIDE CRYS ER 20 MEQ PO TBCR: 20.0000 meq | EXTENDED_RELEASE_TABLET | Freq: Two times a day (BID) | ORAL | 0 refills | Status: DC

## 2021-11-03 NOTE — Progress Notes (Signed)
[facility] [facility] [place]. [facility] [address], [place], [place] [address] [phone] (phone) [phone] (fax)  Patient Care Team: [name], NP as PCP - General (Nurse Practitioner) Telford Nab, RN as Oncology Nurse Navigator   Name of the patient: Maurice Simpson  [id number]  1955-10-12   Date of visit: 11/03/21  Diagnosis- Extensive stage small cell lung cancer  Chief complaint/ Reason for visit- Dizziness.   Heme/Onc history:  Oncology History  Small cell lung cancer ([place])  07/21/2021 Initial Diagnosis   Small cell lung cancer ([place])   07/21/2021 Cancer Staging   Staging form: Lung, AJCC 8th Edition - Clinical stage from 07/21/2021: Stage IVB (cT2, cN0, pM1c) - Signed by Sindy Guadeloupe, MD on 07/21/2021   08/03/2021 - 09/01/2021 Chemotherapy   Patient is on Treatment Plan : LUNG SCLC Carboplatin + Etoposide + Atezolizumab Induction q21d / Atezolizumab Maintenance q21d     09/01/2021 -  Chemotherapy   Patient is on Treatment Plan : LUNG SCLC Carboplatin + Etoposide + Atezolizumab Induction q21d x 4 cycles / Atezolizumab Maintenance q21d       Interval history-patient is 66 year old male with extensive stage small cell lung cancer currently undergoing carbo-etoposide-atezolizumab who returns to clinic for complaints of ongoing weight loss, loss of appetite, dizziness and weakness.  Patient's wife concerned for intake of food and fluids.  He received IV fluids intermittently as needed.  Nausea has improved with use of olanzapine.  Denies pain or shortness of breath.  No falls. Often feels 'swimmy headed'.   ECOG FS:3 - Symptomatic, >50% confined to bed  Review of systems- Review of Systems  Constitutional:  Positive for malaise/fatigue and weight loss. Negative for chills and fever.  HENT:  Negative for hearing loss, nosebleeds, sore throat and tinnitus.    Eyes:  Negative for blurred vision and double vision.  Respiratory:  Negative for cough, hemoptysis, shortness of breath and wheezing.   Cardiovascular:  Negative for chest pain, palpitations and leg swelling.  Gastrointestinal:  Positive for nausea. Negative for abdominal pain, blood in stool, constipation, diarrhea, melena and vomiting.  Genitourinary:  Negative for dysuria and urgency.  Musculoskeletal:  Negative for back pain, falls, joint pain and myalgias.  Skin:  Negative for itching and rash.  Neurological:  Positive for dizziness, weakness and headaches. Negative for tingling, sensory change and loss of consciousness.  Endo/Heme/Allergies:  Negative for environmental allergies. Does not bruise/bleed easily.  Psychiatric/Behavioral:  Negative for depression. The patient is not nervous/anxious and does not have insomnia.      Current treatment- carbo-etoposide-tecentriq, cycle 4 started on 10/19/21  Allergies  Allergen Reactions   Bee Venom     Past Medical History:  Diagnosis Date   Cancer ([place])    COPD (chronic obstructive pulmonary disease) ([place])    Dyspnea     Past Surgical History:  Procedure Laterality Date   CATARACT EXTRACTION Right 03/10/2021   IR IMAGING GUIDED PORT INSERTION  07/27/2021    Social History   Socioeconomic History   Marital status: Married    Spouse name: Not on file   Number of children: Not on file   Years of education: Not on file   Highest education level: Not on file  Occupational History   Not on file  Tobacco Use   Smoking status: Every Day    Packs/day: 2.00    Years: 51.00  Total pack years: 102.00    Types: Cigarettes   Smokeless tobacco: Never  Vaping Use   Vaping Use: Never used  Substance and Sexual Activity   Alcohol use: Not Currently   Drug use: Never   Sexual activity: Yes  Other Topics Concern   Not on file  Social History Narrative   Not on file   Social Determinants of Health   Financial Resource  Strain: Low Risk  (07/13/2021)   Overall Financial Resource Strain (CARDIA)    Difficulty of Paying Living Expenses: Not hard at all  Food Insecurity: No Food Insecurity (07/13/2021)   Hunger Vital Sign    Worried About Running Out of Food in the Last Year: Never true    Ran Out of Food in the Last Year: Never true  Transportation Needs: No Transportation Needs (07/13/2021)   PRAPARE - Hydrologist (Medical): No    Lack of Transportation (Non-Medical): No  Physical Activity: Inactive (07/20/2019)   Exercise Vital Sign    Days of Exercise per Week: 0 days    Minutes of Exercise per Session: 0 min  Stress: Stress Concern Present (07/13/2021)   [place]    Feeling of Stress : To some extent  Social Connections: Moderately Isolated (07/13/2021)   Social Connection and Isolation Panel [NHANES]    Frequency of Communication with Friends and Family: More than three times a week    Frequency of Social Gatherings with Friends and Family: Twice a week    Attends Religious Services: Never    Marine scientist or Organizations: No    Attends Archivist Meetings: Never    Marital Status: Married  Human resources officer Violence: Not At Risk (07/13/2021)   Humiliation, Afraid, Rape, and Kick questionnaire    Fear of Current or Ex-Partner: No    Emotionally Abused: No    Physically Abused: No    Sexually Abused: No    Family History  Problem Relation Age of Onset   Diabetes Mother    Hypertension Mother    Diabetes Father    Hypertension Father    Diabetes Sister    Cancer - Colon Cousin      Current Outpatient Medications:    albuterol (PROVENTIL) (2.5 MG/3ML) 0.083% nebulizer solution, Take 3 mLs (2.5 mg total) by nebulization every 6 (six) hours as needed for wheezing or shortness of breath. (Patient not taking: Reported on 10/27/2021), Disp: 75 mL, Rfl: 12   budesonide (PULMICORT) 0.25  MG/2ML nebulizer solution, Take 2 mLs (0.25 mg total) by nebulization 2 (two) times daily as needed. (Patient not taking: Reported on 10/27/2021), Disp: 60 mL, Rfl: 12   clotrimazole (LOTRIMIN) 1 % external solution, SMARTSIG:3-4 Drop(s) In Ear(s) Twice Daily, Disp: , Rfl:    dexamethasone (DECADRON) 4 MG tablet, Take 1 tablet (4 mg total) by mouth daily., Disp: 5 tablet, Rfl: 0   Docusate Sodium (COLACE PO), Take 1 capsule by mouth as needed (constipation)., Disp: , Rfl:    dronabinol (MARINOL) 5 MG capsule, Take 1 capsule (5 mg total) by mouth 2 (two) times daily before lunch and supper., Disp: 30 capsule, Rfl: 0   lidocaine-prilocaine (EMLA) cream, Apply 1 Application topically as needed. Apply to [facility] a cath prior to access, Disp: , Rfl:    loperamide (IMODIUM) 2 MG capsule, Take 2 mg by mouth as needed for diarrhea or loose stools., Disp: , Rfl:    Multi  Ginseng 1000 MG CAPS, Take 1 capsule (1,000 mg total) by mouth in the morning and at bedtime., Disp: 60 capsule, Rfl: 2   NEOMYCIN-POLYMYXIN-HYDROCORTISONE (CORTISPORIN) 1 % SOLN OTIC solution, SMARTSIG:3-4 Drop(s) In Ear(s) Twice Daily, Disp: , Rfl:    nicotine (NICOTROL) 10 MG inhaler, Inhale 1 Cartridge (1 continuous puffing total) into the lungs as needed for smoking cessation., Disp: 42 each, Rfl: 1   nystatin (MYCOSTATIN) 100000 UNIT/ML suspension, Take 5 mLs (500,000 Units total) by mouth 3 (three) times daily., Disp: 180 mL, Rfl: 0   OLANZapine (ZYPREXA) 10 MG tablet, TAKE 1 TABLET BY MOUTH EVERYDAY AT BEDTIME, Disp: 90 tablet, Rfl: 1   ondansetron (ZOFRAN) 4 MG tablet, Take 1 tablet (4 mg total) by mouth every 8 (eight) hours as needed for nausea or vomiting., Disp: 60 tablet, Rfl: 3   oxyCODONE (OXY IR/ROXICODONE) 5 MG immediate release tablet, Take 1 tablet (5 mg total) by mouth every 4 (four) hours as needed for severe pain., Disp: 60 tablet, Rfl: 0   pantoprazole (PROTONIX) 20 MG tablet, TAKE 1 TABLET BY MOUTH EVERY DAY, Disp: 90  tablet, Rfl: 0   potassium chloride SA (KLOR-CON M) 20 MEQ tablet, Take 1 tablet (20 mEq total) by mouth 2 (two) times daily for 7 days., Disp: 14 tablet, Rfl: 0   prochlorperazine (COMPAZINE) 10 MG tablet, Take 1 tablet (10 mg total) by mouth every 6 (six) hours as needed for nausea or vomiting., Disp: 60 tablet, Rfl: 2   rosuvastatin (CRESTOR) 10 MG tablet, Take 10 mg by mouth daily., Disp: , Rfl:    traZODone (DESYREL) 50 MG tablet, TAKE 1 TABLET BY MOUTH AT BEDTIME AS NEEDED FOR SLEEP. (Patient not taking: Reported on 09/01/2021), Disp: 90 tablet, Rfl: 1   valACYclovir (VALTREX) 1000 MG tablet, Take 1 tablet (1,000 mg total) by mouth daily., Disp: 10 tablet, Rfl: 10   Varenicline Tartrate, Starter, (CHANTIX STARTING MONTH PAK) 0.5 MG X 11 & 1 MG X 42 TBPK, Take 0.5mg  tablet by mouth every morning for 3 days, THEN 0.5 mg tablet by mouth 2 (two) times daily for 4 days, THEN 1mg  tablet by mouth 2 (two) times daily, Disp: 53 each, Rfl: 0 No current facility-administered medications for this visit.  Facility-Administered Medications Ordered in Other Visits:    0.9 %  sodium chloride infusion, , Intravenous, Weekly, Sindy Guadeloupe, MD, Last Rate: 999 mL/hr at 11/03/21 1020, New Bag at 11/03/21 1020   heparin lock flush 100 unit/mL, 500 Units, Intracatheter, Once PRN, Sindy Guadeloupe, MD   potassium chloride 20 mEq in 100 mL IVPB, 20 mEq, Intravenous, Once, Verlon Au, NP, Last Rate: 100 mL/hr at 11/03/21 1120, 20 mEq at 11/03/21 1120  Physical exam:  Vitals:   11/03/21 1056  BP: 114/60  Pulse: 100  Resp: 18  Temp: (!) 97.1 F (36.2 C)  TempSrc: Tympanic  SpO2: 94%   Physical Exam Constitutional:      General: He is not in acute distress.    Appearance: He is well-developed. He is ill-appearing.  HENT:     Head:     Comments: Chronic skin darkening post radiation    Ears:     Comments: Hard of hearing    Mouth/Throat:     Pharynx: No oropharyngeal exudate.  Eyes:     General: No  scleral icterus.    Conjunctiva/sclera: Conjunctivae normal.  Cardiovascular:     Rate and Rhythm: Normal rate and regular rhythm.  Pulmonary:  Effort: Pulmonary effort is normal.     Breath sounds: Normal breath sounds. No wheezing.  Abdominal:     General: There is no distension.     Palpations: Abdomen is soft.     Tenderness: There is no abdominal tenderness.  Musculoskeletal:     Right lower leg: No edema.     Left lower leg: No edema.  Skin:    General: Skin is warm and dry.     Coloration: Skin is pale. Skin is not jaundiced.  Neurological:     Mental Status: He is alert and oriented to person, place, and time.  Psychiatric:        Behavior: Behavior normal.     Comments: Flat affect         Latest Ref Rng & Units 11/03/2021   10:19 AM  CMP  Glucose 70 - 99 mg/dL 137   BUN 8 - 23 mg/dL 10   Creatinine 0.61 - 1.24 mg/dL 0.97   Sodium 135 - 145 mmol/L 137   Potassium 3.5 - 5.1 mmol/L 2.6   Chloride 98 - 111 mmol/L 101   CO2 22 - 32 mmol/L 25   Calcium 8.9 - 10.3 mg/dL 8.9       Latest Ref Rng & Units 11/03/2021   10:19 AM  CBC  WBC 4.0 - 10.5 K/uL 15.4   Hemoglobin 13.0 - 17.0 g/dL 8.8   Hematocrit 39.0 - 52.0 % 25.9   Platelets 150 - 400 K/uL 73    No images are attached to the encounter.  MR Brain W Wo Contrast  Result Date: 11/03/2021 CLINICAL DATA:  Metastatic lung cancer. Assess response to treatment EXAM: MRI HEAD WITHOUT AND WITH CONTRAST TECHNIQUE: Multiplanar, multiecho pulse sequences of the brain and surrounding structures were obtained without and with intravenous contrast. CONTRAST:  70mL GADAVIST GADOBUTROL 1 MMOL/ML IV SOLN COMPARISON:  MRI head with contrast 07/02/2021 FINDINGS: Brain: Marked improvement in widespread metastatic disease since the prior study 17 mm lesion left frontal lobe is significantly smaller. There are blood products and methemoglobin in the lesion without significant enhancement. 4 x 12 mm lesion in the right occipital  parietal lobe significantly smaller. This is now hyperintense on T1 compatible with methemoglobin. No significant enhancement. Numerous additional enhancing lesions throughout the brain have resolved. No new lesion. Ventricle size normal. No midline shift. Negative for acute infarct Vascular: Normal arterial flow voids Skull and upper cervical spine: No focal skeletal lesion. Sinuses/Orbits: Mild mucosal edema paranasal sinuses. Bilateral mastoid effusion right greater than left. Cataract extraction on the right Other: None IMPRESSION: Marked improvement in widespread metastatic disease since the prior MRI of 07/02/2021. No new lesion. Electronically Signed   By: Franchot Gallo M.D.   On: 11/03/2021 12:01    Assessment and plan- Patient is a 66 y.o. male with extensive stage small cell lung cancer who [facility] for complains of loss of appetite, poor intake, ongoing dizziness, and fatigue.   Cachexia-as evidenced by anorexia, weight loss, reduced muscle mass and adiposity. No improvement with dietary counseling. Previously prescribed olanzapine. Encouraged use of olanzapine for nausea as well as anorexia and cachexia.  We reviewed that in a randomized trial, 124 patient starting chemotherapy for lung, pancreaticobiliary, or stomach cancer were assigned to olanzapine 2.5 daily versus placebo.  Patient's and the olanzapine group experienced at least 5% weight gain (60 vs 9%) and appetite improvement (43% versus 30%),.  May also improve his chronic nausea.  Recommended that  he take daily. Would not recommend glucocorticoids in setting of immunotherapy. Start mirtazapine 7.5 qhs. Discontinue trazodone d/t interaction.  In a phase 2 trial at 74 nondepressed patients with cancer associated anorexia and cachexia received an 8-week course of mirtazapine 15 to 30 mg daily.  4 of 17 patient's gained 1 kg or more and 24% reported improved appetite however more recent trial did not support any  benefit to this drug ([address], [place], [place], [place], [place], [place], 2021. J Pain Symptom Management. 2021; 62(6):1207.).   If fails, 2 week trial of megace 480 mg/day up to 800 mg/day, though significant risk of thromboembolic event particularly in setting of potentially progressive malignancy and smoking. If no improvement in 2 weeks, unlikely to improve appetite and would discontinue.   Hypokalemia- K 2.6. He will receive 20 meq of IV potassium in clinic. Restart oral potassium 20 meq twice daily.  Goals of care- I've asked for follow up with palliative care for ongoing symptom management next week. Spoke to [employer], NP and updated him on patient condition.  Brain metastases- clinically concerning for progressive disease. Called radiology and asked for read of yesterday's MRI. Reviewed which shows improvement in widespread metastatic disease. Reviewed with patient and wife.   Disposition:  Start mirtazapine Stop trazodone.  Start oral potassium twice a day.  Follow up with [facility] for palliative care visit next week.    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 Hypokalemia due to inadequate potassium intake    Patient expressed understanding and was in agreement with this plan. He also understands that He can call clinic at any time with any questions, concerns, or complaints.   Thank you for allowing me to participate in the care of this very pleasant patient.   Beckey Rutter, DNP, AGNP-C [place] at [place]

## 2021-11-03 NOTE — Progress Notes (Signed)
Pt reports a lot of weakness. Feels dizzy at times. Zero appetite. Nausea with solid foods. Can tolerate jello, soup, ensure. Added on to [facility] per wife request to be seen.

## 2021-11-04 ENCOUNTER — Ambulatory Visit: Payer: Medicare HMO | Admitting: Dietician

## 2021-11-04 NOTE — Progress Notes (Signed)
Attempted to reach patient for a remote nutrition follow up.  Called home and cell telephone #s and wasn't able to leave a voice mail at either number.  Sent text messages with contact information to reschedule.  April Manson, RDN, LDN Registered Dietitian, [place] Part Time Remote (Usual office hours: Tuesday-Thursday) Mobile: [phone] Remote Office: [phone]

## 2021-11-06 ENCOUNTER — Inpatient Hospital Stay: Payer: Medicare HMO | Attending: Internal Medicine

## 2021-11-09 ENCOUNTER — Inpatient Hospital Stay: Payer: Medicare HMO | Admitting: Oncology

## 2021-11-09 ENCOUNTER — Inpatient Hospital Stay: Payer: Medicare HMO

## 2021-11-09 ENCOUNTER — Inpatient Hospital Stay: Payer: Medicare HMO | Admitting: Hospice and Palliative Medicine

## 2021-11-09 ENCOUNTER — Telehealth: Payer: Self-pay | Admitting: *Deleted

## 2021-11-09 ENCOUNTER — Other Ambulatory Visit: Payer: Self-pay | Admitting: Oncology

## 2021-11-09 DIAGNOSIS — C3490 Malignant neoplasm of unspecified part of unspecified bronchus or lung: Secondary | ICD-10-CM

## 2021-11-09 NOTE — Telephone Encounter (Signed)
[facility] called and spoke to wife and she said that he was no feeling good and needed fluids. Pt was given [facility] for poss. Fluids tom. Pt coming in tom.

## 2021-11-10 ENCOUNTER — Other Ambulatory Visit: Payer: Self-pay | Admitting: *Deleted

## 2021-11-10 ENCOUNTER — Inpatient Hospital Stay: Payer: Medicare HMO

## 2021-11-10 ENCOUNTER — Inpatient Hospital Stay (HOSPITAL_BASED_OUTPATIENT_CLINIC_OR_DEPARTMENT_OTHER): Payer: Medicare HMO | Admitting: Hospice and Palliative Medicine

## 2021-11-10 ENCOUNTER — Encounter: Payer: Medicare HMO | Admitting: Hospice and Palliative Medicine

## 2021-11-10 VITALS — BP 111/73 | HR 68 | Temp 97.0°F | Resp 18

## 2021-11-10 DIAGNOSIS — D6481 Anemia due to antineoplastic chemotherapy: Secondary | ICD-10-CM | POA: Diagnosis not present

## 2021-11-10 DIAGNOSIS — E860 Dehydration: Secondary | ICD-10-CM

## 2021-11-10 DIAGNOSIS — E876 Hypokalemia: Secondary | ICD-10-CM

## 2021-11-10 DIAGNOSIS — C3490 Malignant neoplasm of unspecified part of unspecified bronchus or lung: Secondary | ICD-10-CM

## 2021-11-10 DIAGNOSIS — R630 Anorexia: Secondary | ICD-10-CM | POA: Diagnosis not present

## 2021-11-10 DIAGNOSIS — C786 Secondary malignant neoplasm of retroperitoneum and peritoneum: Secondary | ICD-10-CM | POA: Diagnosis not present

## 2021-11-10 DIAGNOSIS — Z5112 Encounter for antineoplastic immunotherapy: Secondary | ICD-10-CM | POA: Diagnosis not present

## 2021-11-10 DIAGNOSIS — Z79899 Other long term (current) drug therapy: Secondary | ICD-10-CM | POA: Diagnosis not present

## 2021-11-10 DIAGNOSIS — Z5111 Encounter for antineoplastic chemotherapy: Secondary | ICD-10-CM | POA: Diagnosis not present

## 2021-11-10 DIAGNOSIS — C7931 Secondary malignant neoplasm of brain: Secondary | ICD-10-CM | POA: Diagnosis not present

## 2021-11-10 DIAGNOSIS — C3431 Malignant neoplasm of lower lobe, right bronchus or lung: Secondary | ICD-10-CM | POA: Diagnosis not present

## 2021-11-10 LAB — MAGNESIUM: Magnesium: 2 mg/dL (ref 1.7–2.4)

## 2021-11-10 LAB — CBC WITH DIFFERENTIAL/PLATELET
Abs Immature Granulocytes: 0.16 10*3/uL — ABNORMAL HIGH (ref 0.00–0.07)
Basophils Absolute: 0.1 10*3/uL (ref 0.0–0.1)
Basophils Relative: 1 %
Eosinophils Absolute: 0.1 10*3/uL (ref 0.0–0.5)
Eosinophils Relative: 1 %
HCT: 30.9 % — ABNORMAL LOW (ref 39.0–52.0)
Hemoglobin: 9.9 g/dL — ABNORMAL LOW (ref 13.0–17.0)
Immature Granulocytes: 1 %
Lymphocytes Relative: 21 %
Lymphs Abs: 2.7 10*3/uL (ref 0.7–4.0)
MCH: 31.3 pg (ref 26.0–34.0)
MCHC: 32 g/dL (ref 30.0–36.0)
MCV: 97.8 fL (ref 80.0–100.0)
Monocytes Absolute: 0.8 10*3/uL (ref 0.1–1.0)
Monocytes Relative: 6 %
Neutro Abs: 9.2 10*3/uL — ABNORMAL HIGH (ref 1.7–7.7)
Neutrophils Relative %: 70 %
Platelets: 386 10*3/uL (ref 150–400)
RBC: 3.16 MIL/uL — ABNORMAL LOW (ref 4.22–5.81)
RDW: 17.2 % — ABNORMAL HIGH (ref 11.5–15.5)
WBC: 13 10*3/uL — ABNORMAL HIGH (ref 4.0–10.5)
nRBC: 0 % (ref 0.0–0.2)

## 2021-11-10 LAB — COMPREHENSIVE METABOLIC PANEL
ALT: 15 U/L (ref 0–44)
AST: 25 U/L (ref 15–41)
Albumin: 3 g/dL — ABNORMAL LOW (ref 3.5–5.0)
Alkaline Phosphatase: 84 U/L (ref 38–126)
Anion gap: 10 (ref 5–15)
BUN: 10 mg/dL (ref 8–23)
CO2: 23 mmol/L (ref 22–32)
Calcium: 8.7 mg/dL — ABNORMAL LOW (ref 8.9–10.3)
Chloride: 106 mmol/L (ref 98–111)
Creatinine, Ser: 1.11 mg/dL (ref 0.61–1.24)
GFR, Estimated: >60 mL/min (ref >60)
Glucose, Bld: 132 mg/dL — ABNORMAL HIGH (ref 70–99)
Potassium: 3 mmol/L — ABNORMAL LOW (ref 3.5–5.1)
Sodium: 139 mmol/L (ref 135–145)
Total Bilirubin: 0.5 mg/dL (ref 0.3–1.2)
Total Protein: 6.8 g/dL (ref 6.5–8.1)

## 2021-11-10 MED ORDER — DRONABINOL 5 MG PO CAPS: 5.0000 mg | ORAL_CAPSULE | Freq: Two times a day (BID) | ORAL | 0 refills | Status: DC

## 2021-11-10 MED ORDER — SODIUM CHLORIDE 0.9 % IV SOLN
INTRAVENOUS | Status: DC
  Filled 2021-11-10: qty 250

## 2021-11-10 MED ORDER — POTASSIUM CHLORIDE 20 MEQ/15ML (10%) PO SOLN: 40.0000 meq | Freq: Every day | ORAL | 0 refills | Status: DC

## 2021-11-10 MED ORDER — MIRTAZAPINE 15 MG PO TABS: 15.0000 mg | ORAL_TABLET | Freq: Every day | ORAL | 2 refills | Status: DC

## 2021-11-10 MED ORDER — NYSTATIN 100000 UNIT/ML MT SUSP: 5.0000 mL | Freq: Three times a day (TID) | OROMUCOSAL | 0 refills | Status: DC

## 2021-11-10 MED ORDER — HEPARIN SOD (PORK) LOCK FLUSH 100 UNIT/ML IV SOLN
500.0000 [IU] | Freq: Once | INTRAVENOUS | Status: AC | PRN
  Administered 2021-11-10: 500 [IU]
  Filled 2021-11-10: qty 5

## 2021-11-10 MED ORDER — SODIUM CHLORIDE 0.9% FLUSH
10.0000 mL | Freq: Once | INTRAVENOUS | Status: AC | PRN
  Administered 2021-11-10: 10 mL
  Filled 2021-11-10: qty 10

## 2021-11-10 MED ORDER — POTASSIUM CHLORIDE 20 MEQ/100ML IV SOLN
20.0000 meq | Freq: Once | INTRAVENOUS | Status: AC
  Administered 2021-11-10: 20 meq via INTRAVENOUS

## 2021-11-10 NOTE — Progress Notes (Signed)
Complains of progressing weakness. He can't seem to swallow food once he puts it in his mouth. He has zero appetite. He is trying to drink 3 a day of boost, ensure and body armour. Occ nausea, no vomiting. Bowels irregular. Dyspnea with exertion. Spending majority of time in his bed with electric blanket.

## 2021-11-10 NOTE — Progress Notes (Signed)
Symptom Management [place] at [facility] [phone] [phone]  Patient Care Team: [name], NP as PCP - General (Nurse Practitioner) Telford Nab, RN as Oncology Nurse Navigator   NAME OF PATIENT: Maurice Simpson  [id number]  1955/03/21   DATE OF VISIT: 11/10/21  REASON FOR CONSULT: Maurice Simpson is a 66 y.o. male with multiple medical problems including extensive stage small cell lung cancer currently on treatment with carbo-etoposide-atezolizumab.   INTERVAL HISTORY: Patient has had recent weight loss, oral intake, progressive weakness.  Patient was recently seen by Beckey Rutter, NP and started on mirtazapine 7.5 mg nightly as an appetite stimulant.  Today, patient denies distressing symptoms although he feels somewhat weak.  He does not feel like the mirtazapine is significantly helped.  He continues to endorse poor appetite and says it is primarily driven by smells.  Denies any neurologic complaints. Denies recent fevers or illnesses. Denies any easy bleeding or bruising. Reports poor appetite . Denies chest pain. Denies any nausea, vomiting, constipation, or diarrhea. Denies urinary complaints. Patient offers no further specific complaints today.   PAST MEDICAL HISTORY: Past Medical History:  Diagnosis Date   Cancer ([place])    COPD (chronic obstructive pulmonary disease) ([place])    Dyspnea     PAST SURGICAL HISTORY:  Past Surgical History:  Procedure Laterality Date   CATARACT EXTRACTION Right 03/10/2021   IR IMAGING GUIDED PORT INSERTION  07/27/2021    HEMATOLOGY/ONCOLOGY HISTORY:  Oncology History  Small cell lung cancer ([place])  07/21/2021 Initial Diagnosis   Small cell lung cancer ([place])   07/21/2021 Cancer Staging   Staging form: Lung, AJCC 8th Edition - Clinical stage from 07/21/2021: Stage IVB (cT2, cN0, pM1c) - Signed by Sindy Guadeloupe, MD on 07/21/2021   08/03/2021 - 09/01/2021 Chemotherapy   Patient is  on Treatment Plan : LUNG SCLC Carboplatin + Etoposide + Atezolizumab Induction q21d / Atezolizumab Maintenance q21d     09/01/2021 -  Chemotherapy   Patient is on Treatment Plan : LUNG SCLC Carboplatin + Etoposide + Atezolizumab Induction q21d x 4 cycles / Atezolizumab Maintenance q21d       ALLERGIES:  is allergic to bee venom.  MEDICATIONS:  Current Outpatient Medications  Medication Sig Dispense Refill   albuterol (PROVENTIL) (2.5 MG/3ML) 0.083% nebulizer solution Take 3 mLs (2.5 mg total) by nebulization every 6 (six) hours as needed for wheezing or shortness of breath. (Patient not taking: Reported on 10/27/2021) 75 mL 12   budesonide (PULMICORT) 0.25 MG/2ML nebulizer solution Take 2 mLs (0.25 mg total) by nebulization 2 (two) times daily as needed. (Patient not taking: Reported on 10/27/2021) 60 mL 12   clotrimazole (LOTRIMIN) 1 % external solution SMARTSIG:3-4 Drop(s) In Ear(s) Twice Daily     dexamethasone (DECADRON) 4 MG tablet Take 1 tablet (4 mg total) by mouth daily. 5 tablet 0   Docusate Sodium (COLACE PO) Take 1 capsule by mouth as needed (constipation).     dronabinol (MARINOL) 5 MG capsule Take 1 capsule (5 mg total) by mouth 2 (two) times daily before lunch and supper. 30 capsule 0   lidocaine-prilocaine (EMLA) cream Apply 1 Application topically as needed. Apply to [facility] a cath prior to access     loperamide (IMODIUM) 2 MG capsule Take 2 mg by mouth as needed for diarrhea or loose stools.     mirtazapine (REMERON) 7.5 MG tablet Take 1 tablet (7.5 mg total) by mouth at bedtime. For appetite stimulation 30  tablet 2   Multi Ginseng 1000 MG CAPS Take 1 capsule (1,000 mg total) by mouth in the morning and at bedtime. 60 capsule 2   NEOMYCIN-POLYMYXIN-HYDROCORTISONE (CORTISPORIN) 1 % SOLN OTIC solution SMARTSIG:3-4 Drop(s) In Ear(s) Twice Daily     nicotine (NICOTROL) 10 MG inhaler Inhale 1 Cartridge (1 continuous puffing total) into the lungs as needed for smoking cessation. 42 each  1   nystatin (MYCOSTATIN) 100000 UNIT/ML suspension Take 5 mLs (500,000 Units total) by mouth 3 (three) times daily. 180 mL 0   OLANZapine (ZYPREXA) 10 MG tablet TAKE 1 TABLET BY MOUTH EVERYDAY AT BEDTIME 90 tablet 1   ondansetron (ZOFRAN) 4 MG tablet Take 1 tablet (4 mg total) by mouth every 8 (eight) hours as needed for nausea or vomiting. 60 tablet 3   oxyCODONE (OXY IR/ROXICODONE) 5 MG immediate release tablet Take 1 tablet (5 mg total) by mouth every 4 (four) hours as needed for severe pain. 60 tablet 0   pantoprazole (PROTONIX) 20 MG tablet TAKE 1 TABLET BY MOUTH EVERY DAY 90 tablet 0   potassium chloride SA (KLOR-CON M) 20 MEQ tablet Take 1 tablet (20 mEq total) by mouth 2 (two) times daily. 60 tablet 0   prochlorperazine (COMPAZINE) 10 MG tablet Take 1 tablet (10 mg total) by mouth every 6 (six) hours as needed for nausea or vomiting. 60 tablet 2   rosuvastatin (CRESTOR) 10 MG tablet Take 10 mg by mouth daily.     valACYclovir (VALTREX) 1000 MG tablet Take 1 tablet (1,000 mg total) by mouth daily. 10 tablet 10   Varenicline Tartrate, Starter, (CHANTIX STARTING MONTH PAK) 0.5 MG X 11 & 1 MG X 42 TBPK Take 0.5mg  tablet by mouth every morning for 3 days, THEN 0.5 mg tablet by mouth 2 (two) times daily for 4 days, THEN 1mg  tablet by mouth 2 (two) times daily 53 each 0   No current facility-administered medications for this visit.   Facility-Administered Medications Ordered in Other Visits  Medication Dose Route Frequency Provider Last Rate Last Admin   0.9 %  sodium chloride infusion   Intravenous Weekly Sindy Guadeloupe, MD 999 mL/hr at 11/10/21 1338 New Bag at 11/10/21 1338   heparin lock flush 100 unit/mL  500 Units Intracatheter Once PRN Sindy Guadeloupe, MD       potassium chloride 20 mEq in 100 mL IVPB  20 mEq Intravenous Once Lamari Beckles, Kirt Boys, NP 100 mL/hr at 11/10/21 1436 20 mEq at 11/10/21 1436    VITAL SIGNS: BP 111/73 (BP Location: Right Arm, Patient Position: Sitting)   Pulse 68    Temp (!) 97 F (36.1 C) (Tympanic)   Resp 18   SpO2 91%  There were no vitals filed for this visit.  Estimated body mass index is 27.67 kg/m as calculated from the following:   Height as of 08/25/21: 5\' 8"  (1.727 m).   Weight as of 10/19/21: 182 lb (82.6 kg).  LABS: CBC:    Component Value Date/Time   WBC 13.0 (H) 11/10/2021 1339   HGB 9.9 (L) 11/10/2021 1339   HGB 18.0 (H) 04/30/2021 1136   HCT 30.9 (L) 11/10/2021 1339   HCT 53.3 (H) 04/30/2021 1136   PLT 386 11/10/2021 1339   PLT 156 04/30/2021 1136   MCV 97.8 11/10/2021 1339   MCV 91 04/30/2021 1136   MCV 94 01/21/2011 0134   NEUTROABS 9.2 (H) 11/10/2021 1339   NEUTROABS 4.8 04/30/2021 1136   LYMPHSABS 2.7 11/10/2021 1339  LYMPHSABS 2.9 04/30/2021 1136   MONOABS 0.8 11/10/2021 1339   EOSABS 0.1 11/10/2021 1339   EOSABS 0.3 04/30/2021 1136   BASOSABS 0.1 11/10/2021 1339   BASOSABS 0.1 04/30/2021 1136   Comprehensive Metabolic Panel:    Component Value Date/Time   NA 139 11/10/2021 1339   NA 136 04/30/2021 1136   NA 139 01/21/2011 0134   K 3.0 (L) 11/10/2021 1339   K 3.8 01/21/2011 0134   CL 106 11/10/2021 1339   CL 105 01/21/2011 0134   CO2 23 11/10/2021 1339   CO2 23 01/21/2011 0134   BUN 10 11/10/2021 1339   BUN 12 04/30/2021 1136   BUN 15 01/21/2011 0134   CREATININE 1.11 11/10/2021 1339   CREATININE 0.92 01/21/2011 0134   GLUCOSE 132 (H) 11/10/2021 1339   GLUCOSE 118 (H) 01/21/2011 0134   CALCIUM 8.7 (L) 11/10/2021 1339   CALCIUM 9.1 01/21/2011 0134   AST 25 11/10/2021 1339   AST 20 01/21/2011 0134   ALT 15 11/10/2021 1339   ALT 29 01/21/2011 0134   ALKPHOS 84 11/10/2021 1339   ALKPHOS 46 (L) 01/21/2011 0134   BILITOT 0.5 11/10/2021 1339   BILITOT 0.8 04/30/2021 1136   BILITOT 0.8 01/21/2011 0134   PROT 6.8 11/10/2021 1339   PROT 7.2 04/30/2021 1136   PROT 7.4 01/21/2011 0134   ALBUMIN 3.0 (L) 11/10/2021 1339   ALBUMIN 4.3 04/30/2021 1136   ALBUMIN 3.8 01/21/2011 0134    RADIOGRAPHIC  STUDIES: MR Brain W Wo Contrast  Result Date: 11/03/2021 CLINICAL DATA:  Metastatic lung cancer. Assess response to treatment EXAM: MRI HEAD WITHOUT AND WITH CONTRAST TECHNIQUE: Multiplanar, multiecho pulse sequences of the brain and surrounding structures were obtained without and with intravenous contrast. CONTRAST:  24mL GADAVIST GADOBUTROL 1 MMOL/ML IV SOLN COMPARISON:  MRI head with contrast 07/02/2021 FINDINGS: Brain: Marked improvement in widespread metastatic disease since the prior study 17 mm lesion left frontal lobe is significantly smaller. There are blood products and methemoglobin in the lesion without significant enhancement. 4 x 12 mm lesion in the right occipital parietal lobe significantly smaller. This is now hyperintense on T1 compatible with methemoglobin. No significant enhancement. Numerous additional enhancing lesions throughout the brain have resolved. No new lesion. Ventricle size normal. No midline shift. Negative for acute infarct Vascular: Normal arterial flow voids Skull and upper cervical spine: No focal skeletal lesion. Sinuses/Orbits: Mild mucosal edema paranasal sinuses. Bilateral mastoid effusion right greater than left. Cataract extraction on the right Other: None IMPRESSION: Marked improvement in widespread metastatic disease since the prior MRI of 07/02/2021. No new lesion. Electronically Signed   By: Franchot Gallo M.D.   On: 11/03/2021 12:01    PERFORMANCE STATUS (ECOG) : 2 - Symptomatic, <50% confined to bed  Review of Systems Unless otherwise noted, a complete review of systems is negative.  Physical Exam General: NAD HEENT: thrush on tongue Cardiovascular: regular rate and rhythm Pulmonary: clear ant fields Abdomen: soft, nontender, + bowel sounds GU: no suprapubic tenderness Extremities: no edema, no joint deformities Skin: no rashes Neurological: Weakness but otherwise nonfocal  IMPRESSION/PLAN: Anorexia -no significant improvement after starting  mirtazapine.  We will dose increase mirtazapine to 15 mg nightly.  Continue Marinol.  Avoid steroids given immunotherapy.  Could trial Megace, although this would potentially increase risk for thrombotic events.  Patient says poor appetite is mostly stimulated by smell of food.  Discussed increasing calories consumed by drain getting oral nutritional supplements.  Wife plans to get him Ensure.  Hypokalemia -patient not taking oral supplements as he feels like this causes nausea on empty stomach.  Wife is interested in trying liquid potassium, although unclear if insurance will cover this.  Proceed with IV fluids and IV K today.  RTC tomorrow to see Dr. Janese Banks  Patient expressed understanding and was in agreement with this plan. He also understands that He can call clinic at any time with any questions, concerns, or complaints.   Thank you for allowing me to participate in the care of this very pleasant patient.   Time Total: 15 minutes  Visit consisted of counseling and education dealing with the complex and emotionally intense issues of symptom management in the setting of serious illness.Greater than 50%  of this time was spent counseling and coordinating care related to the above assessment and plan.  Signed by: Altha Harm, PhD, NP-C

## 2021-11-11 ENCOUNTER — Inpatient Hospital Stay: Payer: Medicare HMO | Attending: Oncology

## 2021-11-11 ENCOUNTER — Inpatient Hospital Stay: Payer: Medicare HMO

## 2021-11-11 ENCOUNTER — Encounter: Payer: Self-pay | Admitting: Oncology

## 2021-11-11 ENCOUNTER — Inpatient Hospital Stay (HOSPITAL_BASED_OUTPATIENT_CLINIC_OR_DEPARTMENT_OTHER): Payer: Medicare HMO | Admitting: Oncology

## 2021-11-11 VITALS — BP 116/71 | HR 83 | Temp 97.7°F | Resp 16 | Ht 68.0 in | Wt 165.5 lb

## 2021-11-11 DIAGNOSIS — C3490 Malignant neoplasm of unspecified part of unspecified bronchus or lung: Secondary | ICD-10-CM

## 2021-11-11 DIAGNOSIS — C7971 Secondary malignant neoplasm of right adrenal gland: Secondary | ICD-10-CM | POA: Insufficient documentation

## 2021-11-11 DIAGNOSIS — C7972 Secondary malignant neoplasm of left adrenal gland: Secondary | ICD-10-CM | POA: Diagnosis not present

## 2021-11-11 DIAGNOSIS — C7931 Secondary malignant neoplasm of brain: Secondary | ICD-10-CM | POA: Insufficient documentation

## 2021-11-11 DIAGNOSIS — C3431 Malignant neoplasm of lower lobe, right bronchus or lung: Secondary | ICD-10-CM | POA: Insufficient documentation

## 2021-11-11 DIAGNOSIS — T451X5A Adverse effect of antineoplastic and immunosuppressive drugs, initial encounter: Secondary | ICD-10-CM

## 2021-11-11 DIAGNOSIS — Z5112 Encounter for antineoplastic immunotherapy: Secondary | ICD-10-CM

## 2021-11-11 DIAGNOSIS — D6481 Anemia due to antineoplastic chemotherapy: Secondary | ICD-10-CM

## 2021-11-11 DIAGNOSIS — C7889 Secondary malignant neoplasm of other digestive organs: Secondary | ICD-10-CM | POA: Diagnosis not present

## 2021-11-11 DIAGNOSIS — F17210 Nicotine dependence, cigarettes, uncomplicated: Secondary | ICD-10-CM | POA: Diagnosis not present

## 2021-11-11 DIAGNOSIS — Z79899 Other long term (current) drug therapy: Secondary | ICD-10-CM | POA: Insufficient documentation

## 2021-11-11 DIAGNOSIS — C786 Secondary malignant neoplasm of retroperitoneum and peritoneum: Secondary | ICD-10-CM | POA: Insufficient documentation

## 2021-11-11 DIAGNOSIS — R110 Nausea: Secondary | ICD-10-CM

## 2021-11-11 DIAGNOSIS — E876 Hypokalemia: Secondary | ICD-10-CM

## 2021-11-11 MED ORDER — SODIUM CHLORIDE 0.9 % IV SOLN
1200.0000 mg | Freq: Once | INTRAVENOUS | Status: AC
  Administered 2021-11-11: 1200 mg via INTRAVENOUS
  Filled 2021-11-11: qty 20

## 2021-11-11 MED ORDER — PROCHLORPERAZINE EDISYLATE 10 MG/2ML IJ SOLN
10.0000 mg | Freq: Once | INTRAMUSCULAR | Status: AC
  Administered 2021-11-11: 10 mg via INTRAVENOUS
  Filled 2021-11-11: qty 2

## 2021-11-11 MED ORDER — SODIUM CHLORIDE 0.9 % IV SOLN
Freq: Once | INTRAVENOUS | Status: AC
  Filled 2021-11-11: qty 250

## 2021-11-11 MED ORDER — FLUCONAZOLE 100 MG PO TABS: 100.0000 mg | ORAL_TABLET | Freq: Every day | ORAL | 0 refills | Status: DC

## 2021-11-11 MED ORDER — HEPARIN SOD (PORK) LOCK FLUSH 100 UNIT/ML IV SOLN
500.0000 [IU] | Freq: Once | INTRAVENOUS | Status: AC | PRN
  Administered 2021-11-11: 500 [IU]
  Filled 2021-11-11: qty 5

## 2021-11-11 MED ORDER — POTASSIUM CHLORIDE IN NACL 20-0.9 MEQ/L-% IV SOLN
Freq: Once | INTRAVENOUS | Status: AC
  Filled 2021-11-11: qty 1000

## 2021-11-11 NOTE — Progress Notes (Signed)
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  He had a lung cancer screening protocol on 05/29/2021 which showed a right lower lobe lung mass with concern for bilateral adrenal masses.  This was followed by a PET CT scan on 06/15/2021 which showed a right lower lobe soft tissue mass with an SUV uptake of 16.6.  No hypermetabolic hilar or mediastinal adenopathy.  Bilateral hypermetabolic adrenal masses 6.7 and 5.1 cm respectively.  2.8 cm soft tissue lesion in the body of the pancreas and 1.9 cm soft tissue lesion in the tail of the pancreas.  2.3 cm retroperitoneal soft tissue nodule.  10 cm x 6.4 cm soft tissue mass inferior to the transverse colon in the midline.  4.1 x 2.4 cm right mesenteric soft tissue mass with an SUV of 11.7.   MRI brain was significant for multiple areas of intracranial metastatic disease.  Largest lesion in the left frontal lobe with significant edema internal hemorrhage and is causing a mass effect with 6 mm midline shift.  30 metastatic foci seen in the brain.  Patient is undergoing whole brain radiation for the same.     Abdominal mass biopsy showed high-grade carcinoma compatible with metastatic small cell carcinoma of lung origin.   Patient completed 4 cycles of carbo etoposide Tecentriq chemotherapy.  Scan showed good response to treatment and he is presently on maintenance Tecentriq  Interval  history-he has been feeling fatigued.  Food does not taste good and he does not have a good appetite.  He was found to have thrush at symptom management yesterday and was given nystatin mouthwash.  He is unable to swallow his potassium tablets  ECOG PS- 2 Pain scale- 0 Opioid associated constipation- no  Review of systems- Review of Systems  Constitutional:  Positive for malaise/fatigue. Negative for chills, fever and weight loss.       Lack of appetite  HENT:  Negative for congestion, ear discharge and nosebleeds.   Eyes:  Negative for blurred vision.  Respiratory:  Negative for cough, hemoptysis, sputum production, shortness of breath and wheezing.   Cardiovascular:  Negative for chest pain, palpitations, orthopnea and claudication.  Gastrointestinal:  Negative for abdominal pain, blood in stool, constipation, diarrhea, heartburn, melena, nausea and vomiting.  Genitourinary:  Negative for dysuria, flank pain, frequency, hematuria and urgency.  Musculoskeletal:  Negative for back pain, joint pain and myalgias.  Skin:  Negative for rash.  Neurological:  Negative for dizziness, tingling, focal weakness, seizures, weakness and headaches.  Endo/Heme/Allergies:  Does not bruise/bleed easily.  Psychiatric/Behavioral:  Negative for depression and suicidal ideas. The patient does not have insomnia.       Allergies  Allergen Reactions   Bee Venom      Past Medical History:  Diagnosis Date   Cancer ([place])    COPD (chronic obstructive pulmonary disease) ([place])    Dyspnea      Past Surgical History:  Procedure Laterality Date   CATARACT EXTRACTION Right 03/10/2021  IR IMAGING GUIDED PORT INSERTION  07/27/2021    Social History   Socioeconomic History   Marital status: Married    Spouse name: Not on file   Number of children: Not on file   Years of education: Not on file   Highest education level: Not on file  Occupational History   Not on file  Tobacco Use   Smoking status:  Former    Packs/day: 2.00    Years: 51.00    Total pack years: 102.00    Types: Cigarettes    Quit date: 11/07/2021    Years since quitting: 0.0   Smokeless tobacco: Never  Vaping Use   Vaping Use: Never used  Substance and Sexual Activity   Alcohol use: Not Currently   Drug use: Never   Sexual activity: Yes  Other Topics Concern   Not on file  Social History Narrative   Not on file   Social Determinants of Health   Financial Resource Strain: Low Risk  (07/13/2021)   Overall Financial Resource Strain (CARDIA)    Difficulty of Paying Living Expenses: Not hard at all  Food Insecurity: No Food Insecurity (07/13/2021)   Hunger Vital Sign    Worried About Running Out of Food in the Last Year: Never true    [place] in the Last Year: Never true  Transportation Needs: No Transportation Needs (07/13/2021)   PRAPARE - Hydrologist (Medical): No    Lack of Transportation (Non-Medical): No  Physical Activity: Inactive (07/20/2019)   Exercise Vital Sign    Days of Exercise per Week: 0 days    Minutes of Exercise per Session: 0 min  Stress: Stress Concern Present (07/13/2021)   [place]    Feeling of Stress : To some extent  Social Connections: Moderately Isolated (07/13/2021)   Social Connection and Isolation Panel [NHANES]    Frequency of Communication with Friends and Family: More than three times a week    Frequency of Social Gatherings with Friends and Family: Twice a week    Attends Religious Services: Never    Marine scientist or Organizations: No    Attends Archivist Meetings: Never    Marital Status: Married  Human resources officer Violence: Not At Risk (07/13/2021)   Humiliation, Afraid, Rape, and Kick questionnaire    Fear of Current or Ex-Partner: No    Emotionally Abused: No    Physically Abused: No    Sexually Abused: No    Family History  Problem Relation Age  of Onset   Diabetes Mother    Hypertension Mother    Diabetes Father    Hypertension Father    Diabetes Sister    Cancer - Colon Cousin      Current Outpatient Medications:    albuterol (PROVENTIL) (2.5 MG/3ML) 0.083% nebulizer solution, Take 3 mLs (2.5 mg total) by nebulization every 6 (six) hours as needed for wheezing or shortness of breath., Disp: 75 mL, Rfl: 12   budesonide (PULMICORT) 0.25 MG/2ML nebulizer solution, Take 2 mLs (0.25 mg total) by nebulization 2 (two) times daily as needed., Disp: 60 mL, Rfl: 12   Docusate Sodium (COLACE PO), Take 1 capsule by mouth as needed (constipation)., Disp: , Rfl:    fluconazole (DIFLUCAN) 100 MG tablet, Take 1 tablet (100 mg total) by mouth daily. Day 1- take 2 diflucan tablet and rest of the time take 1 daily for thrush, Disp:  8 tablet, Rfl: 0   lidocaine-prilocaine (EMLA) cream, Apply 1 Application topically as needed. Apply to [facility] a cath prior to access, Disp: , Rfl:    loperamide (IMODIUM) 2 MG capsule, Take 2 mg by mouth as needed for diarrhea or loose stools., Disp: , Rfl:    OLANZapine (ZYPREXA) 10 MG tablet, TAKE 1 TABLET BY MOUTH EVERYDAY AT BEDTIME, Disp: 90 tablet, Rfl: 1   ondansetron (ZOFRAN) 4 MG tablet, Take 1 tablet (4 mg total) by mouth every 8 (eight) hours as needed for nausea or vomiting., Disp: 60 tablet, Rfl: 3   oxyCODONE (OXY IR/ROXICODONE) 5 MG immediate release tablet, Take 1 tablet (5 mg total) by mouth every 4 (four) hours as needed for severe pain., Disp: 60 tablet, Rfl: 0   pantoprazole (PROTONIX) 20 MG tablet, TAKE 1 TABLET BY MOUTH EVERY DAY, Disp: 90 tablet, Rfl: 0   prochlorperazine (COMPAZINE) 10 MG tablet, Take 1 tablet (10 mg total) by mouth every 6 (six) hours as needed for nausea or vomiting., Disp: 60 tablet, Rfl: 2   dexamethasone (DECADRON) 4 MG tablet, Take 1 tablet (4 mg total) by mouth daily. (Patient not taking: Reported on 11/11/2021), Disp: 5 tablet, Rfl: 0   dronabinol (MARINOL) 5 MG capsule, Take 1  capsule (5 mg total) by mouth 2 (two) times daily before lunch and supper. (Patient not taking: Reported on 11/11/2021), Disp: 30 capsule, Rfl: 0   mirtazapine (REMERON) 15 MG tablet, Take 1 tablet (15 mg total) by mouth at bedtime. (Patient not taking: Reported on 11/11/2021), Disp: 30 tablet, Rfl: 2   Multi Ginseng 1000 MG CAPS, Take 1 capsule (1,000 mg total) by mouth in the morning and at bedtime. (Patient not taking: Reported on 11/11/2021), Disp: 60 capsule, Rfl: 2   nystatin (MYCOSTATIN) 100000 UNIT/ML suspension, Take 5 mLs (500,000 Units total) by mouth 3 (three) times daily., Disp: 180 mL, Rfl: 0   potassium chloride 20 MEQ/15ML (10%) SOLN, Take 30 mLs (40 mEq total) by mouth daily. (Patient not taking: Reported on 11/11/2021), Disp: 473 mL, Rfl: 0   rosuvastatin (CRESTOR) 10 MG tablet, Take 10 mg by mouth daily. (Patient not taking: Reported on 11/11/2021), Disp: , Rfl:    valACYclovir (VALTREX) 1000 MG tablet, Take 1 tablet (1,000 mg total) by mouth daily. (Patient not taking: Reported on 11/11/2021), Disp: 10 tablet, Rfl: 10  Physical exam:  Vitals:   11/11/21 1329  BP: 116/71  Pulse: 83  Resp: 16  Temp: 97.7 F (36.5 C)  TempSrc: Tympanic  Weight: 165 lb 8 oz (75.1 kg)  Height: 5\' 8"  (1.727 m)   Physical Exam Constitutional:      General: He is not in acute distress. HENT:     Mouth/Throat:     Comments: There is evidence of thrush superimposed with coated tongue. Cardiovascular:     Rate and Rhythm: Normal rate and regular rhythm.     Heart sounds: Normal heart sounds.  Pulmonary:     Effort: Pulmonary effort is normal.     Breath sounds: Normal breath sounds.  Abdominal:     General: Bowel sounds are normal.     Palpations: Abdomen is soft.  Skin:    General: Skin is warm and dry.  Neurological:     Mental Status: He is alert and oriented to person, place, and time.         Latest Ref Rng & Units 11/10/2021    1:39 PM  CMP  Glucose 70 - 99 mg/dL  132   BUN 8 -  23 mg/dL 10   Creatinine 0.61 - 1.24 mg/dL 1.11   Sodium 135 - 145 mmol/L 139   Potassium 3.5 - 5.1 mmol/L 3.0   Chloride 98 - 111 mmol/L 106   CO2 22 - 32 mmol/L 23   Calcium 8.9 - 10.3 mg/dL 8.7   Total Protein 6.5 - 8.1 g/dL 6.8   Total Bilirubin 0.3 - 1.2 mg/dL 0.5   Alkaline Phos 38 - 126 U/L 84   AST 15 - 41 U/L 25   ALT 0 - 44 U/L 15       Latest Ref Rng & Units 11/10/2021    1:39 PM  CBC  WBC 4.0 - 10.5 K/uL 13.0   Hemoglobin 13.0 - 17.0 g/dL 9.9   Hematocrit 39.0 - 52.0 % 30.9   Platelets 150 - 400 K/uL 386     No images are attached to the encounter.  MR Brain W Wo Contrast  Result Date: 11/03/2021 CLINICAL DATA:  Metastatic lung cancer. Assess response to treatment EXAM: MRI HEAD WITHOUT AND WITH CONTRAST TECHNIQUE: Multiplanar, multiecho pulse sequences of the brain and surrounding structures were obtained without and with intravenous contrast. CONTRAST:  29mL GADAVIST GADOBUTROL 1 MMOL/ML IV SOLN COMPARISON:  MRI head with contrast 07/02/2021 FINDINGS: Brain: Marked improvement in widespread metastatic disease since the prior study 17 mm lesion left frontal lobe is significantly smaller. There are blood products and methemoglobin in the lesion without significant enhancement. 4 x 12 mm lesion in the right occipital parietal lobe significantly smaller. This is now hyperintense on T1 compatible with methemoglobin. No significant enhancement. Numerous additional enhancing lesions throughout the brain have resolved. No new lesion. Ventricle size normal. No midline shift. Negative for acute infarct Vascular: Normal arterial flow voids Skull and upper cervical spine: No focal skeletal lesion. Sinuses/Orbits: Mild mucosal edema paranasal sinuses. Bilateral mastoid effusion right greater than left. Cataract extraction on the right Other: None IMPRESSION: Marked improvement in widespread metastatic disease since the prior MRI of 07/02/2021. No new lesion. Electronically Signed   By:  Franchot Gallo M.D.   On: 11/03/2021 12:01     Assessment and plan- Patient is a 66 y.o. male with extensive stage small cell lung cancer s/p 4 cycles of carbo etoposide Tecentriq chemotherapy.  He is here for on treatment assessment prior to cyclle 1 of palliative Tecentriq  Counts okay to proceed with cycle 1 of palliative Tecentriq today.  I will see him back in 4 weeks for cycle 2.  We are skipping the week of Thanksgiving.  He already has a scan scheduled for next week.  Plan is to continue Tecentriq until progression or toxicity.  Oral thrush: He has nystatin mouthwash but given his significant symptoms as well as lack of appetite secondary to it I will also add oral fluconazole which she will take for 7 days.  Hypokalemia: I will give him 20 mEq of IV potassium today.  1 L of IV fluids later this week and he will continue to receive weekly IV fluids for the next 2 weeks.  He will see NP Altha Harm in 2 weeks.  Chemo-induced anemia: Improving   Visit Diagnosis 1. Small cell lung cancer ([place])   2. Hypokalemia   3. Encounter for antineoplastic immunotherapy   4. Antineoplastic chemotherapy induced anemia      Dr. Randa Evens, MD, MPH [facility] at [facility] [phone] 11/11/2021 5:29 PM

## 2021-11-11 NOTE — Patient Instructions (Signed)
[facility]CER CTR AT [place]  Discharge Instructions: Thank you for choosing [facility] to provide your oncology and hematology care.  If you have a lab appointment with the [facility], please go directly to the [facility] and check in at the registration area.  Wear comfortable clothing and clothing appropriate for easy access to any Portacath or PICC line.   We strive to give you quality time with your provider. You may need to reschedule your appointment if you arrive late (15 or more minutes).  Arriving late affects you and other patients whose appointments are after yours.  Also, if you miss three or more appointments without notifying the office, you may be dismissed from the clinic at the provider's discretion.      For prescription refill requests, have your pharmacy contact our office and allow 72 hours for refills to be completed.    Today you received the following chemotherapy and/or immunotherapy agents TECENTRIQ      To help prevent nausea and vomiting after your treatment, we encourage you to take your nausea medication as directed.  BELOW ARE SYMPTOMS THAT SHOULD BE REPORTED IMMEDIATELY: *FEVER GREATER THAN 100.4 F (38 C) OR HIGHER *CHILLS OR SWEATING *NAUSEA AND VOMITING THAT IS NOT CONTROLLED WITH YOUR NAUSEA MEDICATION *UNUSUAL SHORTNESS OF BREATH *UNUSUAL BRUISING OR BLEEDING *URINARY PROBLEMS (pain or burning when urinating, or frequent urination) *BOWEL PROBLEMS (unusual diarrhea, constipation, pain near the anus) TENDERNESS IN MOUTH AND THROAT WITH OR WITHOUT PRESENCE OF ULCERS (sore throat, sores in mouth, or a toothache) UNUSUAL RASH, SWELLING OR PAIN  UNUSUAL VAGINAL DISCHARGE OR ITCHING   Items with * indicate a potential emergency and should be followed up as soon as possible or go to the Emergency Department if any problems should occur.  Please show the CHEMOTHERAPY ALERT CARD or IMMUNOTHERAPY ALERT CARD at check-in to  the Emergency Department and triage nurse.  Should you have questions after your visit or need to cancel or reschedule your appointment, please contact [facility] CANCER [place] AT [place]  [phone] and follow the prompts.  Office hours are 8:00 a.m. to 4:30 p.m. Monday - Friday. Please note that voicemails left after 4:00 p.m. may not be returned until the following business day.  We are closed weekends and major holidays. You have access to a nurse at all times for urgent questions. Please call the main number to the clinic [phone] and follow the prompts.  For any non-urgent questions, you may also contact your provider using MyChart. We now offer e-Visits for anyone 32 and older to request care online for non-urgent symptoms. For details visit [email or web address].   Also download the MyChart app! Go to the app store, search "MyChart", open the app, select [facility], and log in with your MyChart username and password.  Masks are optional in the cancer centers. If you would like for your care team to wear a mask while they are taking care of you, please let them know. For doctor visits, patients may have with them one support person who is at least 66 years old. At this time, visitors are not allowed in the infusion area.  Atezolizumab Injection What is this medication? ATEZOLIZUMAB (a te zoe LIZ ue mab) treats some types of cancer. It works by helping your immune system slow or stop the spread of cancer cells. It is a monoclonal antibody. This medicine may be used for other purposes; ask your health care provider or pharmacist if you have  questions. COMMON BRAND NAME(S): Tecentriq What should I tell my care team before I take this medication? They need to know if you have any of these conditions: Allogeneic stem cell transplant (uses someone else's stem cells) Autoimmune diseases, such as Crohn disease, ulcerative colitis, lupus History of chest radiation Nervous system  problems, such as Guillain-Barre syndrome, myasthenia gravis Organ transplant An unusual or allergic reaction to atezolizumab, other medications, foods, dyes, or preservatives Pregnant or trying to get pregnant Breast-feeding How should I use this medication? This medication is injected into a vein. It is given by your care team in a hospital or clinic setting. A special MedGuide will be given to you before each treatment. Be sure to read this information carefully each time. Talk to your care team about the use of this medication in children. While it may be prescribed for children as young as 2 years for selected conditions, precautions do apply. Overdosage: If you think you have taken too much of this medicine contact a poison control center or emergency room at once. NOTE: This medicine is only for you. Do not share this medicine with others. What if I miss a dose? Keep appointments for follow-up doses. It is important not to miss your dose. Call your care team if you are unable to keep an appointment. What may interact with this medication? Interactions have not been studied. This list may not describe all possible interactions. Give your health care provider a list of all the medicines, herbs, non-prescription drugs, or dietary supplements you use. Also tell them if you smoke, drink alcohol, or use illegal drugs. Some items may interact with your medicine. What should I watch for while using this medication? Your condition will be monitored carefully while you are receiving this medication. You may need blood work while taking this medication. This medication may cause serious skin reactions. They can happen weeks to months after starting the medication. Contact your care team right away if you notice fevers or flu-like symptoms with a rash. The rash may be red or purple and then turn into blisters or peeling of the skin. You may also notice a red rash with swelling of the face, lips, or  lymph nodes in your neck or under your arms. Tell your care team right away if you have any change in your eyesight. Talk to your care team if you may be pregnant. Serious birth defects can occur if you take this medication during pregnancy and for 5 months after the last dose. You will need a negative pregnancy test before starting this medication. Contraception is recommended while taking this medication and for 5 months after the last dose. Your care team can help you find the option that works for you. Do not breastfeed while taking this medication and for at least 5 months after the last dose. What side effects may I notice from receiving this medication? Side effects that you should report to your doctor or health care professional as soon as possible: Allergic reactions--skin rash, itching, hives, swelling of the face, lips, tongue, or throat Dry cough, shortness of breath or trouble breathing Eye pain, redness, irritation, or discharge with blurry or decreased vision Heart muscle inflammation--unusual weakness or fatigue, shortness of breath, chest pain, fast or irregular heartbeat, dizziness, swelling of the ankles, feet, or hands Hormone gland problems--headache, sensitivity to light, unusual weakness or fatigue, dizziness, fast or irregular heartbeat, increased sensitivity to cold or heat, excessive sweating, constipation, hair loss, increased thirst or amount of  urine, tremors or shaking, irritability Infusion reactions--chest pain, shortness of breath or trouble breathing, feeling faint or lightheaded Kidney injury (glomerulonephritis)--decrease in the amount of urine, red or dark brown urine, foamy or bubbly urine, swelling of the ankles, hands, or feet Liver injury--right upper belly pain, loss of appetite, nausea, light-colored stool, dark yellow or brown urine, yellowing skin or eyes, unusual weakness or fatigue Pain, tingling, or numbness in the hands or feet, muscle weakness, change  in vision, confusion or trouble speaking, loss of balance or coordination, trouble walking, seizures Rash, fever, and swollen lymph nodes Redness, blistering, peeling, or loosening of the skin, including inside the mouth Sudden or severe stomach pain, bloody diarrhea, fever, nausea, vomiting Side effects that usually do not require medical attention (report to your doctor or health care professional if they continue or are bothersome): Bone, joint, or muscle pain Diarrhea Fatigue Loss of appetite Nausea Skin rash This list may not describe all possible side effects. Call your doctor for medical advice about side effects. You may report side effects to FDA at 1-800-FDA-1088. Where should I keep my medication? This medication is given in a hospital or clinic. It will not be stored at home. NOTE: This sheet is a summary. It may not cover all possible information. If you have questions about this medicine, talk to your doctor, pharmacist, or health care provider.  2023 Elsevier/Gold Standard (2021-05-12 00:00:00)

## 2021-11-13 ENCOUNTER — Inpatient Hospital Stay: Payer: Medicare HMO

## 2021-11-17 ENCOUNTER — Ambulatory Visit: Payer: Medicare HMO | Attending: Oncology

## 2021-11-17 ENCOUNTER — Telehealth: Payer: Self-pay | Admitting: Oncology

## 2021-11-18 ENCOUNTER — Encounter: Payer: Self-pay | Admitting: Oncology

## 2021-11-18 ENCOUNTER — Telehealth: Payer: Self-pay | Admitting: *Deleted

## 2021-11-18 NOTE — Telephone Encounter (Signed)
Per Pamala Hurry in scheduling- wife had left a vm yesterday requesting that her husband was needing iv fluids. I attempted to reach pt's wife [name] to discuss concerns. Left vm to have her call us back.

## 2021-11-18 NOTE — Telephone Encounter (Signed)
Per brenda- patient wife called stating that she does want appointment for pt for IV fluids tomorrow and to please call her back [phone]

## 2021-11-18 NOTE — Telephone Encounter (Signed)
Error

## 2021-11-19 ENCOUNTER — Ambulatory Visit: Payer: Medicare HMO

## 2021-11-19 ENCOUNTER — Inpatient Hospital Stay: Payer: Medicare HMO

## 2021-11-19 ENCOUNTER — Other Ambulatory Visit: Payer: Medicare HMO

## 2021-11-19 ENCOUNTER — Encounter: Payer: Medicare HMO | Admitting: Hospice and Palliative Medicine

## 2021-11-19 ENCOUNTER — Other Ambulatory Visit: Payer: Self-pay | Admitting: *Deleted

## 2021-11-19 ENCOUNTER — Inpatient Hospital Stay: Payer: Medicare HMO | Admitting: Hospice and Palliative Medicine

## 2021-11-19 DIAGNOSIS — R110 Nausea: Secondary | ICD-10-CM

## 2021-11-19 DIAGNOSIS — E876 Hypokalemia: Secondary | ICD-10-CM

## 2021-11-19 DIAGNOSIS — E860 Dehydration: Secondary | ICD-10-CM

## 2021-11-24 ENCOUNTER — Inpatient Hospital Stay: Payer: Medicare HMO | Admitting: Hospice and Palliative Medicine

## 2021-11-24 ENCOUNTER — Other Ambulatory Visit: Payer: Self-pay | Admitting: Hospice and Palliative Medicine

## 2021-11-24 ENCOUNTER — Telehealth: Payer: Self-pay | Admitting: *Deleted

## 2021-11-24 ENCOUNTER — Inpatient Hospital Stay: Payer: Medicare HMO

## 2021-11-24 DIAGNOSIS — C3490 Malignant neoplasm of unspecified part of unspecified bronchus or lung: Secondary | ICD-10-CM

## 2021-11-24 NOTE — Progress Notes (Signed)
Home palliative care

## 2021-11-24 NOTE — Telephone Encounter (Signed)
I attempted to reach patient/pt's wife. No answer. Call went directly to vm. I left a vm - requesting a call back to cancer center to discuss apts/concerns.

## 2021-11-24 NOTE — Telephone Encounter (Signed)
Call returned. Patient only drinking high protein 30 g ensure and sips of sprite. Patient vomits and spits foods out and throws up. Patient has completed his course of diflucan. Pt very weak. Patient continues to take potassium tablets as directed but has difficulty swallowing his potassium tablets, but wife believes pt is malnourished. Lqd potassium is $95 with insurance and pt is unable to afford the medication. Pt has a desire to eat but is unable to get any food down.  Patient has poor oral care per wife. Wife believe pt may still have thrush-white patches still in mouth.  Pt declines apts in the cancer center frequently and has no showed. Discussed the importance of keeping smc/supportive care apts.  Wife is interested in a home palliative care referral.  Wife would like to r/s smc apts today. New Apt given to pt's wife for 1030 tomorrow.

## 2021-11-24 NOTE — Telephone Encounter (Signed)
Patient is also contemplating on cnl - the bone scan on Thursday due to his fatigue/weakness

## 2021-11-24 NOTE — Telephone Encounter (Signed)
Per Hassan Rowan in Triage - wife [name] she said she is having trouble getting him to come in and that he needs to have his labs checked [phone]

## 2021-11-25 ENCOUNTER — Emergency Department: Payer: Medicare HMO

## 2021-11-25 ENCOUNTER — Inpatient Hospital Stay: Payer: Medicare HMO

## 2021-11-25 ENCOUNTER — Ambulatory Visit: Payer: Medicare HMO

## 2021-11-25 ENCOUNTER — Inpatient Hospital Stay: Payer: Medicare HMO | Admitting: Hospice and Palliative Medicine

## 2021-11-25 ENCOUNTER — Other Ambulatory Visit: Payer: Self-pay

## 2021-11-25 ENCOUNTER — Ambulatory Visit: Payer: Medicare HMO | Admitting: Hospice and Palliative Medicine

## 2021-11-25 ENCOUNTER — Other Ambulatory Visit: Payer: Medicare HMO

## 2021-11-25 ENCOUNTER — Inpatient Hospital Stay
Admission: EM | Admit: 2021-11-25 | Discharge: 2021-11-27 | DRG: 194 | Disposition: A | Discharge status: Home Health | Payer: Medicare HMO | Attending: Internal Medicine | Admitting: Internal Medicine

## 2021-11-25 DIAGNOSIS — J189 Pneumonia, unspecified organism: Secondary | ICD-10-CM

## 2021-11-25 DIAGNOSIS — J440 Chronic obstructive pulmonary disease with acute lower respiratory infection: Secondary | ICD-10-CM | POA: Diagnosis present

## 2021-11-25 DIAGNOSIS — R531 Weakness: Principal | ICD-10-CM

## 2021-11-25 DIAGNOSIS — Z79899 Other long term (current) drug therapy: Secondary | ICD-10-CM | POA: Diagnosis not present

## 2021-11-25 DIAGNOSIS — Z6825 Body mass index (BMI) 25.0-25.9, adult: Secondary | ICD-10-CM | POA: Diagnosis not present

## 2021-11-25 DIAGNOSIS — C3401 Malignant neoplasm of right main bronchus: Secondary | ICD-10-CM | POA: Diagnosis not present

## 2021-11-25 DIAGNOSIS — Z1152 Encounter for screening for COVID-19: Secondary | ICD-10-CM

## 2021-11-25 DIAGNOSIS — E440 Moderate protein-calorie malnutrition: Secondary | ICD-10-CM | POA: Diagnosis present

## 2021-11-25 DIAGNOSIS — J181 Lobar pneumonia, unspecified organism: Principal | ICD-10-CM | POA: Diagnosis present

## 2021-11-25 DIAGNOSIS — J439 Emphysema, unspecified: Secondary | ICD-10-CM | POA: Diagnosis not present

## 2021-11-25 DIAGNOSIS — J449 Chronic obstructive pulmonary disease, unspecified: Secondary | ICD-10-CM | POA: Diagnosis present

## 2021-11-25 DIAGNOSIS — Z87891 Personal history of nicotine dependence: Secondary | ICD-10-CM | POA: Diagnosis not present

## 2021-11-25 DIAGNOSIS — C3431 Malignant neoplasm of lower lobe, right bronchus or lung: Secondary | ICD-10-CM | POA: Diagnosis not present

## 2021-11-25 DIAGNOSIS — C7971 Secondary malignant neoplasm of right adrenal gland: Secondary | ICD-10-CM | POA: Diagnosis not present

## 2021-11-25 DIAGNOSIS — C786 Secondary malignant neoplasm of retroperitoneum and peritoneum: Secondary | ICD-10-CM | POA: Diagnosis not present

## 2021-11-25 DIAGNOSIS — Z7951 Long term (current) use of inhaled steroids: Secondary | ICD-10-CM

## 2021-11-25 DIAGNOSIS — I700 Atherosclerosis of aorta: Secondary | ICD-10-CM | POA: Diagnosis not present

## 2021-11-25 DIAGNOSIS — C7931 Secondary malignant neoplasm of brain: Secondary | ICD-10-CM | POA: Diagnosis not present

## 2021-11-25 DIAGNOSIS — C7972 Secondary malignant neoplasm of left adrenal gland: Secondary | ICD-10-CM | POA: Diagnosis present

## 2021-11-25 DIAGNOSIS — C7889 Secondary malignant neoplasm of other digestive organs: Secondary | ICD-10-CM | POA: Diagnosis present

## 2021-11-25 DIAGNOSIS — Z515 Encounter for palliative care: Secondary | ICD-10-CM

## 2021-11-25 DIAGNOSIS — R110 Nausea: Secondary | ICD-10-CM | POA: Diagnosis not present

## 2021-11-25 DIAGNOSIS — J168 Pneumonia due to other specified infectious organisms: Secondary | ICD-10-CM | POA: Diagnosis not present

## 2021-11-25 DIAGNOSIS — C3490 Malignant neoplasm of unspecified part of unspecified bronchus or lung: Secondary | ICD-10-CM | POA: Diagnosis not present

## 2021-11-25 DIAGNOSIS — Z91030 Bee allergy status: Secondary | ICD-10-CM | POA: Diagnosis not present

## 2021-11-25 DIAGNOSIS — E876 Hypokalemia: Secondary | ICD-10-CM | POA: Diagnosis present

## 2021-11-25 DIAGNOSIS — E46 Unspecified protein-calorie malnutrition: Secondary | ICD-10-CM | POA: Diagnosis not present

## 2021-11-25 LAB — BASIC METABOLIC PANEL
Anion gap: 11 (ref 5–15)
BUN: 12 mg/dL (ref 8–23)
CO2: 23 mmol/L (ref 22–32)
Calcium: 9.2 mg/dL (ref 8.9–10.3)
Chloride: 106 mmol/L (ref 98–111)
Creatinine, Ser: 0.94 mg/dL (ref 0.61–1.24)
GFR, Estimated: >60 mL/min (ref >60)
Glucose, Bld: 130 mg/dL — ABNORMAL HIGH (ref 70–99)
Potassium: 3.6 mmol/L (ref 3.5–5.1)
Sodium: 140 mmol/L (ref 135–145)

## 2021-11-25 LAB — TROPONIN I (HIGH SENSITIVITY): Troponin I (High Sensitivity): 12 ng/L (ref <18)

## 2021-11-25 LAB — URINALYSIS, ROUTINE W REFLEX MICROSCOPIC
Bacteria, UA: NONE SEEN
Bilirubin Urine: NEGATIVE
Glucose, UA: NEGATIVE mg/dL
Hgb urine dipstick: NEGATIVE
Ketones, ur: NEGATIVE mg/dL
Leukocytes,Ua: NEGATIVE
Nitrite: NEGATIVE
Protein, ur: 30 mg/dL — AB
Specific Gravity, Urine: 1.019 (ref 1.005–1.030)
pH: 6 (ref 5.0–8.0)

## 2021-11-25 LAB — CBC
HCT: 34 % — ABNORMAL LOW (ref 39.0–52.0)
Hemoglobin: 10.9 g/dL — ABNORMAL LOW (ref 13.0–17.0)
MCH: 29.4 pg (ref 26.0–34.0)
MCHC: 32.1 g/dL (ref 30.0–36.0)
MCV: 91.6 fL (ref 80.0–100.0)
Platelets: 272 10*3/uL (ref 150–400)
RBC: 3.71 MIL/uL — ABNORMAL LOW (ref 4.22–5.81)
RDW: 16 % — ABNORMAL HIGH (ref 11.5–15.5)
WBC: 23.7 10*3/uL — ABNORMAL HIGH (ref 4.0–10.5)
nRBC: 0 % (ref 0.0–0.2)

## 2021-11-25 LAB — RESP PANEL BY RT-PCR (FLU A&B, COVID) ARPGX2
Influenza A by PCR: NEGATIVE
Influenza B by PCR: NEGATIVE
SARS Coronavirus 2 by RT PCR: NEGATIVE

## 2021-11-25 LAB — PROCALCITONIN: Procalcitonin: 0.42 ng/mL

## 2021-11-25 LAB — MAGNESIUM: Magnesium: 1.8 mg/dL (ref 1.7–2.4)

## 2021-11-25 MED ORDER — SODIUM CHLORIDE 0.9 % IV SOLN: INTRAVENOUS | Status: DC

## 2021-11-25 MED ORDER — PANTOPRAZOLE SODIUM 20 MG PO TBEC
20.0000 mg | DELAYED_RELEASE_TABLET | Freq: Every day | ORAL | Status: DC
  Administered 2021-11-26 – 2021-11-27 (×2): 20 mg via ORAL
  Filled 2021-11-25 (×2): qty 1

## 2021-11-25 MED ORDER — TOBRAMYCIN 0.3 % OP SOLN
2.0000 [drp] | OPHTHALMIC | Status: DC
  Filled 2021-11-25: qty 5

## 2021-11-25 MED ORDER — PIPERACILLIN-TAZOBACTAM 3.375 G IVPB
3.3750 g | Freq: Three times a day (TID) | INTRAVENOUS | Status: DC
  Administered 2021-11-25 – 2021-11-27 (×5): 3.375 g via INTRAVENOUS
  Filled 2021-11-25 (×4): qty 50

## 2021-11-25 MED ORDER — SODIUM CHLORIDE 0.9 % IV SOLN
1.0000 g | Freq: Once | INTRAVENOUS | Status: AC
  Administered 2021-11-25: 1 g via INTRAVENOUS
  Filled 2021-11-25: qty 10

## 2021-11-25 MED ORDER — MIRTAZAPINE 15 MG PO TABS
15.0000 mg | ORAL_TABLET | Freq: Every day | ORAL | Status: DC
  Administered 2021-11-25 – 2021-11-26 (×2): 15 mg via ORAL
  Filled 2021-11-25 (×2): qty 1

## 2021-11-25 MED ORDER — ONDANSETRON HCL 4 MG/2ML IJ SOLN: 4.0000 mg | Freq: Four times a day (QID) | INTRAMUSCULAR | Status: DC | PRN

## 2021-11-25 MED ORDER — SODIUM CHLORIDE 0.9 % IV SOLN
12.5000 mg | Freq: Once | INTRAVENOUS | Status: AC
  Administered 2021-11-25: 12.5 mg via INTRAVENOUS
  Filled 2021-11-25: qty 12.5

## 2021-11-25 MED ORDER — ALBUTEROL SULFATE (2.5 MG/3ML) 0.083% IN NEBU: 2.5000 mg | INHALATION_SOLUTION | Freq: Four times a day (QID) | RESPIRATORY_TRACT | Status: DC | PRN

## 2021-11-25 MED ORDER — ONDANSETRON HCL 4 MG PO TABS: 4.0000 mg | ORAL_TABLET | Freq: Four times a day (QID) | ORAL | Status: DC | PRN

## 2021-11-25 MED ORDER — OFLOXACIN 0.3 % OP SOLN
5.0000 [drp] | Freq: Two times a day (BID) | OPHTHALMIC | Status: DC
  Administered 2021-11-25 – 2021-11-27 (×4): 5 [drp] via OTIC
  Filled 2021-11-25: qty 5

## 2021-11-25 MED ORDER — ACETAMINOPHEN 325 MG PO TABS
650.0000 mg | ORAL_TABLET | Freq: Four times a day (QID) | ORAL | Status: DC | PRN
  Administered 2021-11-25 – 2021-11-26 (×3): 650 mg via ORAL
  Filled 2021-11-25 (×3): qty 2

## 2021-11-25 MED ORDER — BUDESONIDE 0.25 MG/2ML IN SUSP
0.2500 mg | Freq: Two times a day (BID) | RESPIRATORY_TRACT | Status: DC
  Administered 2021-11-26 – 2021-11-27 (×3): 0.25 mg via RESPIRATORY_TRACT
  Filled 2021-11-25 (×3): qty 2

## 2021-11-25 MED ORDER — SODIUM CHLORIDE 0.9 % IV SOLN
500.0000 mg | Freq: Once | INTRAVENOUS | Status: AC
  Administered 2021-11-25: 500 mg via INTRAVENOUS
  Filled 2021-11-25: qty 5

## 2021-11-25 MED ORDER — ENOXAPARIN SODIUM 40 MG/0.4ML IJ SOSY
40.0000 mg | PREFILLED_SYRINGE | INTRAMUSCULAR | Status: DC
  Administered 2021-11-25 – 2021-11-26 (×2): 40 mg via SUBCUTANEOUS
  Filled 2021-11-25 (×2): qty 0.4

## 2021-11-25 MED ORDER — OXYCODONE HCL 5 MG PO TABS: 5.0000 mg | ORAL_TABLET | ORAL | Status: DC | PRN

## 2021-11-25 MED ORDER — LACTATED RINGERS IV BOLUS
1000.0000 mL | Freq: Once | INTRAVENOUS | Status: AC
  Administered 2021-11-25: 1000 mL via INTRAVENOUS

## 2021-11-25 MED ORDER — OLANZAPINE 10 MG PO TABS
10.0000 mg | ORAL_TABLET | Freq: Every day | ORAL | Status: DC
  Administered 2021-11-25 – 2021-11-26 (×2): 10 mg via ORAL
  Filled 2021-11-25 (×2): qty 1

## 2021-11-25 NOTE — ED Triage Notes (Signed)
Pt presents to ED via [facility]  due to increasing weakness that started today. Pt was unable to get out of bed. Pt has hx stage 4 lung cancer. Gets weekly fluid and potassium infusions missed x2. Pt is A&Ox4. Pt denies N/V/D at this moment. Prior to arrival gave self 4mg  zofran.

## 2021-11-25 NOTE — Assessment & Plan Note (Signed)
Not acutely exacerbated  Continue as needed bronchodilator therapy and inhaled steroids 

## 2021-11-25 NOTE — Progress Notes (Signed)
Nutrition  RD planning to see patient when in [facility] today but patient did not show up.    RD called wife's mobile number and no answer. Unable to leave voicemail as mailbox was full.   Jeffrie Lofstrom B. [name], [place], [place] Registered Dietitian [phone]

## 2021-11-25 NOTE — H&P (Signed)
History and Physical    Patient: Maurice Simpson CVE:[id number] DOB: 01-13-1955 DOA: 11/25/2021 DOS: the patient was seen and examined on 11/25/2021 PCP: [name], NP  Patient coming from: Home  Chief Complaint:  Chief Complaint  Patient presents with   Weakness   HPI: Maurice Simpson is a 66 y.o. 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 Patient has had progressive weakness and according to the wife he usually gets around with a cane but over the last 2 days has been very weak and has required a two-person assist to get around.  He has not had any falls.  Oral intake has been very poor due to food not tasting good.  Patient drinks high-protein shakes and so does.  His wife is concerned that his worsening weakness may be related to hypokalemia which she has had but was unable to get him to the cancer center to have his labs checked 1 day prior to his presentation.  He has been taking oral potassium at home. He complains of nausea and a headache but denies having any emesis.  He has a chronic cough that is unchanged and is nonproductive but wife has noted a rattling sound when he breathes concerning for possible congestion.  He denies having any fever or chills.  He denies having any chest pain, no shortness of breath, no abdominal pain, no changes in his bowel habits, no urinary symptoms, no dizziness, no lightheadedness, no blurred vision, no focal deficit. Per his wife he is responding appropriately to chemoradiation therapy Called by ER physician to admit patient for right lower lobe pneumonia.   Review of Systems: As mentioned in the history of present illness. All other systems reviewed and are negative. Past Medical History:  Diagnosis Date   Cancer [facility])    COPD  (chronic obstructive pulmonary disease) ([place])    Dyspnea    Past Surgical History:  Procedure Laterality Date   CATARACT EXTRACTION Right 03/10/2021   IR IMAGING GUIDED PORT INSERTION  07/27/2021   Social History:  reports that he quit smoking about 2 weeks ago. His smoking use included cigarettes. He has a 102.00 pack-year smoking history. He has never used smokeless tobacco. He reports that he does not currently use alcohol. He reports that he does not use drugs.  Allergies  Allergen Reactions   Bee Venom Anaphylaxis    Family History  Problem Relation Age of Onset   Diabetes Mother    Hypertension Mother    Diabetes Father    Hypertension Father    Diabetes Sister    Cancer - Colon Cousin     Prior to Admission medications   Medication Sig Start Date End Date Taking? Authorizing Provider  gentamicin (GARAMYCIN) 0.3 % ophthalmic solution Place 2 drops into both eyes every 4 (four) hours. 10/12/21  Yes [provider]  lidocaine-prilocaine (EMLA) cream Apply 1 Application topically as needed. Apply to [facility] a cath prior to access 10/12/21  Yes [provider]  loratadine (CLARITIN) 10 MG tablet Take 10 mg by mouth daily. 10/28/21  Yes [provider]  mirtazapine (REMERON) 15 MG tablet Take 1 tablet (15 mg total) by mouth at bedtime. 11/10/21  Yes Borders, Kirt Boys, NP  ofloxacin (OCUFLOX) 0.3 % ophthalmic solution Place 5 drops into  the right ear 2 (two) times daily. 10/06/21  Yes [provider]  OLANZapine (ZYPREXA) 10 MG tablet TAKE 1 TABLET BY MOUTH EVERYDAY AT BEDTIME 10/26/21  Yes Verlon Au, NP  pantoprazole (PROTONIX) 20 MG tablet TAKE 1 TABLET BY MOUTH EVERY DAY 08/05/21  Yes Sindy Guadeloupe, MD  albuterol (PROVENTIL) (2.5 MG/3ML) 0.083% nebulizer solution Take 3 mLs (2.5 mg total) by nebulization every 6 (six) hours as needed for wheezing or shortness of breath. 08/03/21   Sindy Guadeloupe, MD  budesonide (PULMICORT) 0.25 MG/2ML nebulizer  solution Take 2 mLs (0.25 mg total) by nebulization 2 (two) times daily as needed. 08/03/21   Sindy Guadeloupe, MD  dexamethasone (DECADRON) 4 MG tablet Take 1 tablet (4 mg total) by mouth daily. Patient not taking: Reported on 11/11/2021 09/16/21   Verlon Au, NP  Docusate Sodium (COLACE PO) Take 1 capsule by mouth as needed (constipation).    [provider]  dronabinol (MARINOL) 5 MG capsule Take 1 capsule (5 mg total) by mouth 2 (two) times daily before lunch and supper. Patient not taking: Reported on 11/11/2021 11/10/21   Borders, Kirt Boys, NP  fluconazole (DIFLUCAN) 100 MG tablet Take 1 tablet (100 mg total) by mouth daily. Day 1- take 2 diflucan tablet and rest of the time take 1 daily for thrush 11/11/21   Sindy Guadeloupe, MD  loperamide (IMODIUM) 2 MG capsule Take 2 mg by mouth as needed for diarrhea or loose stools.    [provider]  Multi Ginseng 1000 MG CAPS Take 1 capsule (1,000 mg total) by mouth in the morning and at bedtime. Patient not taking: Reported on 11/11/2021 10/27/21   Verlon Au, NP  nystatin (MYCOSTATIN) 100000 UNIT/ML suspension Take 5 mLs (500,000 Units total) by mouth 3 (three) times daily. 11/10/21   Borders, Kirt Boys, NP  ondansetron (ZOFRAN) 4 MG tablet Take 1 tablet (4 mg total) by mouth every 8 (eight) hours as needed for nausea or vomiting. 10/12/21   Sindy Guadeloupe, MD  oxyCODONE (OXY IR/ROXICODONE) 5 MG immediate release tablet Take 1 tablet (5 mg total) by mouth every 4 (four) hours as needed for severe pain. 07/01/21   Sindy Guadeloupe, MD  potassium chloride 20 MEQ/15ML (10%) SOLN Take 30 mLs (40 mEq total) by mouth daily. Patient not taking: Reported on 11/11/2021 11/10/21   Borders, Kirt Boys, NP  prochlorperazine (COMPAZINE) 10 MG tablet Take 1 tablet (10 mg total) by mouth every 6 (six) hours as needed for nausea or vomiting. 07/01/21   Sindy Guadeloupe, MD  rosuvastatin (CRESTOR) 10 MG tablet Take 10 mg by mouth daily. Patient not taking:  Reported on 11/11/2021 08/07/21   [provider]  valACYclovir (VALTREX) 1000 MG tablet Take 1 tablet (1,000 mg total) by mouth daily. Patient not taking: Reported on 11/11/2021 08/16/21   Shawna Clamp, MD    Physical Exam: Vitals:   11/25/21 1348 11/25/21 1354 11/25/21 1400  BP:   123/81  Pulse:   90  Resp:   18  Temp:   98.3 F (36.8 C)  TempSrc:   Oral  SpO2: 96%  91%  Weight:  75 kg   Height:  5\' 8"  (1.727 m)    Physical Exam Vitals and nursing note reviewed.  Constitutional:      Comments: Chronically ill-appearing  HENT:     Head: Normocephalic and atraumatic.     Nose: Nose normal.     Mouth/Throat:  Mouth: Mucous membranes are dry.  Eyes:     Comments: Pale conjunctiva  Cardiovascular:     Rate and Rhythm: Normal rate and regular rhythm.  Pulmonary:     Effort: Pulmonary effort is normal.     Breath sounds: Wheezing present.     Comments: Breath sounds in both lung fields.  Scattered wheezes Abdominal:     General: Abdomen is flat. Bowel sounds are normal.     Palpations: Abdomen is soft.  Musculoskeletal:        General: Normal range of motion.     Cervical back: Normal range of motion and neck supple.  Skin:    General: Skin is warm and dry.  Neurological:     Mental Status: He is alert.     Motor: Weakness present.  Psychiatric:        Mood and Affect: Mood normal.        Behavior: Behavior normal.     Data Reviewed: Relevant notes from primary care and specialist visits, past discharge summaries as available in EHR, including Care Everywhere. Prior diagnostic testing as pertinent to current admission diagnoses Updated medications and problem lists for reconciliation ED course, including vitals, labs, imaging, treatment and response to treatment Triage notes, nursing and pharmacy notes and ED provider's notes Notable results as noted in HPI Labs reviewed.  Procalcitonin 0.14, magnesium 1.8, troponin 12, sodium 140, potassium 3.6,  chloride 106, bicarb 23, glucose 130, BUN 12, creatinine 0.94, calcium 9.2, white count 23.7, hemoglobin 10.9, hematocrit 34, platelet count 272 Chest x-ray showed Increased interstitial markings are seen in right lower lung fields suggesting scarring or interstitial pneumonia. CT scan of the head without contrast shows stable left frontal lobe lesion with adjacent vasogenic edema, as well as linear cortical calcification in the right occipital lobe, consistent with known intracranial metastases. No change since recent MRI. No acute infarct or hemorrhage. Chronic right mastoid effusion. Twelve-lead EKG reviewed by me shows sinus rhythm with T wave abnormality in the anterior inferior leads CT scan of the chest without contrast shows enlarging right hilar mass, consistent with progression of malignancy. Exact measurements are difficult without IV contrast, but there is clear enlargement since prior study. Obstruction of the right middle and right lower lobe bronchi by the enlarging right hilar mass, with extensive postobstructive changes in the right middle and right lower lobes. Dense peripheral right lower lobe consolidation favors postobstructive pneumonia over metastatic disease. Incomplete visualization of the known adrenal metastases. Aortic Atherosclerosis (ICD10-I70.0) and Emphysema (ICD10-J43.9). There are no new results to review at this time.  Assessment and Plan: * Lobar pneumonia ([place]) Patient noted to have postobstructive pneumonia and enlarging right hilar mass obstructing the right middle and right lower lobe bronchus. He has worsening leukocytosis Place patient empirically on Zosyn  Small cell lung cancer [facility]) Patient has stage IV small cell lung cancer with metastatic disease to the brain, to the adrenal, to the pancreas and omentum. Currently receiving chemotherapy and immunotherapy We will consult oncology  COPD (chronic obstructive pulmonary disease) ([place]) Not acutely  exacerbated Continue as needed bronchodilator therapy and inhaled steroids  Weakness generalized Most likely related to underlying metastatic cancer  Palliative care consult      Advance Care Planning:   Code Status: Full Code   Consults: Oncology in a.m., palliative care consult  Family Communication: Greater than 50% of time was spent discussing patient's condition and plan of care with him and his wife at the bedside.  All questions  and concerns have been addressed.  They verbalized understanding and agree with the plan.  Severity of Illness: The appropriate patient status for this patient is INPATIENT. Inpatient status is judged to be reasonable and necessary in order to provide the required intensity of service to ensure the patient's safety. The patient's presenting symptoms, physical exam findings, and initial radiographic and laboratory data in the context of their chronic comorbidities is felt to place them at high risk for further clinical deterioration. Furthermore, it is not anticipated that the patient will be medically stable for discharge from the hospital within 2 midnights of admission.   * I certify that at the point of admission it is my clinical judgment that the patient will require inpatient hospital care spanning beyond 2 midnights from the point of admission due to high intensity of service, high risk for further deterioration and high frequency of surveillance required.*  Author: Collier Bullock, MD 11/25/2021 6:13 PM  For on call review [email or web address].

## 2021-11-25 NOTE — ED Notes (Signed)
Informed RN bed assigned 

## 2021-11-25 NOTE — Assessment & Plan Note (Signed)
Most likely related to underlying metastatic cancer  Palliative care consult

## 2021-11-25 NOTE — Assessment & Plan Note (Signed)
Patient has stage IV small cell lung cancer with metastatic disease to the brain, to the adrenal, to the pancreas and omentum. Currently receiving chemotherapy and immunotherapy We will consult oncology

## 2021-11-25 NOTE — Consult Note (Signed)
Pharmacy Antibiotic Note  Maurice Simpson is a 66 y.o. male admitted on 11/25/2021 with pneumonia.  Pharmacy has been consulted for Zosyn dosing.  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  Chest CT:  right lower lobe consolidation favors postobstructive pneumonia.  Plan: Zosyn 3.375g IV q8h (4 hour infusion).  Height: 5\' 8"  (172.7 cm) Weight: 75 kg (165 lb 5.5 oz) IBW/kg (Calculated) : 68.4  Temp (24hrs), Avg:98.3 F (36.8 C), Min:98.3 F (36.8 C), Max:98.3 F (36.8 C)  Recent Labs  Lab 11/25/21 1407  WBC 23.7*  CREATININE 0.94    Estimated Creatinine Clearance: 74.8 mL/min (by C-G formula based on SCr of 0.94 mg/dL).    Allergies  Allergen Reactions   Bee Venom Anaphylaxis    Antimicrobials this admission: Azithromycin/ceftriaxone 11/15 Zosyn 11/15 >>   Dose adjustments this admission: N/A  Microbiology results: 11/15 COVID: negative  Thank you for allowing pharmacy to be a part of this patient's care.  Lorin Picket 11/25/2021 6:16 PM

## 2021-11-25 NOTE — ED Provider Notes (Signed)
[facility] Provider Note    Event Date/Time   First MD Initiated Contact with Patient 11/25/21 1516     (approximate)   History   Chief Complaint Weakness   HPI  Maurice Simpson is a 66 y.o. male with past medical history of COPD and metastatic lung cancer who presents to the ED complaining of weakness.  Wife reports that patient has been feeling weak for a long time, however this has seemed to rapidly progressed over the past couple of days.  She states that he has sounded congested with increasing dry cough, patient reports chronic shortness of breath that is unchanged and denies any chest pain.  He has not had any fevers or chills, denies any abdominal pain, dysuria, or flank pain.  Patient does have longstanding issues with nausea and poor appetite, has only been able to drink Ensure and eat small amounts of pudding for about the past month.  He has recently been treated for oral thrush with both nystatin swallow and oral fluconazole, wife reports that he continues to have white plaques on his tongue but patient denies any pain when swallowing.  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     Physical Exam   Triage Vital Signs: ED Triage Vitals  Enc Vitals Group     BP 11/25/21 1400 123/81     Pulse Rate 11/25/21 1400 90     Resp 11/25/21 1400 18     Temp 11/25/21 1400 98.3 F (36.8 C)     Temp Source 11/25/21 1400 Oral     SpO2 11/25/21 1348 96 %     Weight 11/25/21 1354 165 lb 5.5 oz (75 kg)     Height 11/25/21 1354 5\' 8"  (1.727 m)     Head Circumference --      Peak Flow --      Pain Score 11/25/21 1353 0     Pain Loc --      Pain Edu? --      Excl. in [place]? --     Most recent vital signs: Vitals:   11/25/21 1348 11/25/21 1400  BP:  123/81  Pulse:  90  Resp:  18  Temp:  98.3 F (36.8 C)  SpO2: 96% 91%    Constitutional: Alert and oriented. Eyes: Conjunctivae are normal. Head:  Atraumatic. Nose: No congestion/rhinnorhea. Mouth/Throat: Mucous membranes are moist.  Whitish plaque on tongue that scrapes off, no plaques noted in the posterior oropharynx. Cardiovascular: Normal rate, regular rhythm. Grossly normal heart sounds.  2+ radial pulses bilaterally. Respiratory: Normal respiratory effort.  No retractions. Lungs CTAB. Gastrointestinal: Soft and nontender. No distention. Musculoskeletal: No lower extremity tenderness nor edema.  Neurologic:  Normal speech and language.  Global weakness noted with no gross focal neurologic deficits appreciated.    ED Results / Procedures / Treatments   Labs (all labs ordered are listed, but only abnormal results are displayed) Labs Reviewed  BASIC METABOLIC PANEL - Abnormal; Notable for the following components:      Result Value   Glucose, Bld 130 (*)    All other components within normal limits  CBC - Abnormal; Notable for the following components:   WBC 23.7 (*)    RBC 3.71 (*)    Hemoglobin 10.9 (*)    HCT 34.0 (*)    RDW 16.0 (*)    All other components within normal limits  RESP PANEL BY RT-PCR (FLU A&B, COVID) ARPGX2  URINALYSIS, ROUTINE W REFLEX MICROSCOPIC  PROCALCITONIN  MAGNESIUM  CBG MONITORING, ED  TROPONIN I (HIGH SENSITIVITY)     EKG  ED ECG REPORT I, Blake Divine, the attending physician, personally viewed and interpreted this ECG.   Date: 11/25/2021  EKG Time: 14:07  Rate: 92  Rhythm: normal sinus rhythm  Axis: Normal  Intervals:none  ST&T Change: Inferior and anterior T wave inversions, new compared to previous  RADIOLOGY Chest x-ray reviewed and interpreted by me with right lower lobe infiltrate concerning for pneumonia, no edema or effusion noted.  PROCEDURES:  Critical Care performed: No  Procedures   MEDICATIONS ORDERED IN ED: Medications  promethazine (PHENERGAN) 12.5 mg in sodium chloride 0.9 % 50 mL IVPB (has no administration in time range)  cefTRIAXone (ROCEPHIN) 1  g in sodium chloride 0.9 % 100 mL IVPB (1 g Intravenous New Bag/Given 11/25/21 1603)  azithromycin (ZITHROMAX) 500 mg in sodium chloride 0.9 % 250 mL IVPB (has no administration in time range)  lactated ringers bolus 1,000 mL (1,000 mLs Intravenous New Bag/Given 11/25/21 1601)     IMPRESSION / MDM / ASSESSMENT AND PLAN / ED COURSE  I reviewed the triage vital signs and the nursing notes.                              66 y.o. male with past medical history of COPD and metastatic lung cancer who presents to the ED complaining of progressive weakness with increased cough and congestion over the past couple of days.  Patient's presentation is most consistent with acute presentation with potential threat to life or bodily function.  Differential diagnosis includes, but is not limited to, sepsis, pneumonia, UTI, dehydration, electrolyte abnormality, AKI, intracranial process, progressive metastatic disease.  Patient chronically ill-appearing but in no acute distress, vital signs are unremarkable.  He reports cough and congestion but is not in any respiratory distress and maintaining oxygen saturations on room air at 91%.  Lungs are clear to auscultation bilaterally, no evidence of COPD exacerbation however x-ray is concerning for right lower lobe infiltrate.  He has significant leukocytosis consistent with this but no vital sign abnormality concerning for sepsis.  Plan to start on IV antibiotics and add on procalcitonin.  Anemia is stable compared to previous, no significant electrolyte abnormality or AKI noted.  EKG does show new T wave inversions but troponin within normal limits and I doubt ACS or PE.  We will check CT head to ensure no worsening metastatic disease to explain his weakness and increased nausea.  Anticipate admission given his severe weakness that limits his ability to walk.  CT head shows stable known lesion from previous.  Patient started on IV Rocephin and azithromycin for pneumonia  and case discussed with hospitalist for admission.      FINAL CLINICAL IMPRESSION(S) / ED DIAGNOSES   Final diagnoses:  Generalized weakness  Pneumonia of right lower lobe due to infectious organism     Rx / DC Orders   ED Discharge Orders     None        Note:  This document was prepared using Dragon voice recognition software and may include unintentional dictation errors.   Blake Divine, MD 11/25/21 1620

## 2021-11-25 NOTE — Assessment & Plan Note (Signed)
Patient noted to have postobstructive pneumonia and enlarging right hilar mass obstructing the right middle and right lower lobe bronchus. He has worsening leukocytosis Place patient empirically on Zosyn

## 2021-11-26 ENCOUNTER — Encounter
Admission: RE | Admit: 2021-11-26 | Discharge: 2021-11-26 | Disposition: A | Discharge status: Home / Self Care | Payer: Medicare HMO | Source: Ambulatory Visit | Attending: Oncology | Admitting: Oncology

## 2021-11-26 DIAGNOSIS — Z87891 Personal history of nicotine dependence: Secondary | ICD-10-CM

## 2021-11-26 DIAGNOSIS — E46 Unspecified protein-calorie malnutrition: Secondary | ICD-10-CM

## 2021-11-26 DIAGNOSIS — C7931 Secondary malignant neoplasm of brain: Secondary | ICD-10-CM

## 2021-11-26 DIAGNOSIS — J181 Lobar pneumonia, unspecified organism: Secondary | ICD-10-CM | POA: Diagnosis not present

## 2021-11-26 DIAGNOSIS — C3431 Malignant neoplasm of lower lobe, right bronchus or lung: Secondary | ICD-10-CM | POA: Diagnosis not present

## 2021-11-26 DIAGNOSIS — R531 Weakness: Secondary | ICD-10-CM

## 2021-11-26 DIAGNOSIS — C7971 Secondary malignant neoplasm of right adrenal gland: Secondary | ICD-10-CM

## 2021-11-26 DIAGNOSIS — Z515 Encounter for palliative care: Secondary | ICD-10-CM

## 2021-11-26 DIAGNOSIS — C786 Secondary malignant neoplasm of retroperitoneum and peritoneum: Secondary | ICD-10-CM

## 2021-11-26 DIAGNOSIS — C7972 Secondary malignant neoplasm of left adrenal gland: Secondary | ICD-10-CM

## 2021-11-26 LAB — BASIC METABOLIC PANEL
Anion gap: 8 (ref 5–15)
BUN: 12 mg/dL (ref 8–23)
CO2: 24 mmol/L (ref 22–32)
Calcium: 8.5 mg/dL — ABNORMAL LOW (ref 8.9–10.3)
Chloride: 108 mmol/L (ref 98–111)
Creatinine, Ser: 1.03 mg/dL (ref 0.61–1.24)
GFR, Estimated: >60 mL/min (ref >60)
Glucose, Bld: 99 mg/dL (ref 70–99)
Potassium: 3.5 mmol/L (ref 3.5–5.1)
Sodium: 140 mmol/L (ref 135–145)

## 2021-11-26 LAB — CBC
HCT: 28.2 % — ABNORMAL LOW (ref 39.0–52.0)
Hemoglobin: 9 g/dL — ABNORMAL LOW (ref 13.0–17.0)
MCH: 29 pg (ref 26.0–34.0)
MCHC: 31.9 g/dL (ref 30.0–36.0)
MCV: 91 fL (ref 80.0–100.0)
Platelets: 232 10*3/uL (ref 150–400)
RBC: 3.1 MIL/uL — ABNORMAL LOW (ref 4.22–5.81)
RDW: 16 % — ABNORMAL HIGH (ref 11.5–15.5)
WBC: 14.4 10*3/uL — ABNORMAL HIGH (ref 4.0–10.5)
nRBC: 0 % (ref 0.0–0.2)

## 2021-11-26 MED ORDER — ENSURE ENLIVE PO LIQD
237.0000 mL | Freq: Four times a day (QID) | ORAL | Status: DC
  Administered 2021-11-26 – 2021-11-27 (×2): 237 mL via ORAL

## 2021-11-26 MED ORDER — ADULT MULTIVITAMIN W/MINERALS CH
1.0000 | ORAL_TABLET | Freq: Every day | ORAL | Status: DC
  Administered 2021-11-26 – 2021-11-27 (×2): 1 via ORAL
  Filled 2021-11-26 (×2): qty 1

## 2021-11-26 NOTE — Assessment & Plan Note (Addendum)
Due to metastatic disease, poor PO intake, now with acute illness / infection. --PT/OT recommend [facility] --Wheelchair ordered per wife's request --TOC consulted --Fall precautions

## 2021-11-26 NOTE — Progress Notes (Signed)
Ambulated patient to bathroom and back to bed. Patient weak , has very poor trunk control.

## 2021-11-26 NOTE — Evaluation (Signed)
Physical Therapy Evaluation Patient Details Name: Maurice Simpson MRN: [id number] DOB: [birth year] Today's Date: 11/26/2021  History of Present Illness  66 y.o. 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  Clinical Impression  Patient received in bed, family at bedside. He is limited by pain and fatigue. Patient agreeable to PT/OT assessment. He required min A for supine>< sit. Min A for sit to stand with cues for safety/hand placement. Patient ambulated 20 feet with RW and MIN +2 assist for balance and safety. Patient with right lean in standing and ambulating. He is very weak. Patient will continue to benefit from skilled PT while here to improve strength, activity tolerance and safety.        Recommendations for follow up therapy are one component of a multi-disciplinary discharge planning process, led by the attending physician.  Recommendations may be updated based on patient status, additional functional criteria and insurance authorization.  Follow Up Recommendations Home health PT      Assistance Recommended at Discharge Frequent or constant Supervision/Assistance  Patient can return home with the following  A little help with walking and/or transfers;A little help with bathing/dressing/bathroom;Help with stairs or ramp for entrance;Assist for transportation    Equipment Recommendations Rolling walker (2 wheels);BSC/3in1  Recommendations for Other Services       Functional Status Assessment Patient has had a recent decline in their functional status and demonstrates the ability to make significant improvements in function in a reasonable and predictable amount of time.     Precautions / Restrictions Precautions Precautions: Fall Restrictions Weight Bearing  Restrictions: No      Mobility  Bed Mobility Overal bed mobility: Needs Assistance Bed Mobility: Supine to Sit, Sit to Supine     Supine to sit: Min assist Sit to supine: Min assist        Transfers Overall transfer level: Needs assistance Equipment used: Rolling walker (2 wheels) Transfers: Sit to/from Stand Sit to Stand: Min guard, +2 physical assistance, +2 safety/equipment           General transfer comment: VC for sequencing, hand placement    Ambulation/Gait Ambulation/Gait assistance: Min assist Gait Distance (Feet): 20 Feet Assistive device: Rolling walker (2 wheels) Gait Pattern/deviations: Step-through pattern, Decreased step length - right, Decreased step length - left, Staggering right, Trunk flexed Gait velocity: decr     General Gait Details: patient with unsteadiness due to weakness. Right leaning with standing and sitting. Poor activity tolerance. Increased risk of falls.  Stairs            Wheelchair Mobility    Modified Rankin (Stroke Patients Only)       Balance Overall balance assessment: Needs assistance Sitting-balance support: Feet supported, Single extremity supported Sitting balance-Leahy Scale: Fair   Postural control: Right lateral lean Standing balance support: Reliant on assistive device for balance, Bilateral upper extremity supported, During functional activity Standing balance-Leahy Scale: Poor Standing balance comment: R lateral lean in standing and sitting                             Pertinent Vitals/Pain Pain Assessment Pain Assessment: 0-10 Pain Score: 9  Pain Location: back, L shoulder Pain Descriptors / Indicators: Aching, Discomfort Pain Intervention(s): Monitored during session, Repositioned, RN gave  pain meds during session    Home Living Family/patient expects to be discharged to:: Unsure Living Arrangements: Spouse/significant other Available Help at Discharge: Family;Available 24  hours/day Type of Home: House Home Access: Stairs to enter Entrance Stairs-Rails: Psychiatric nurse of Steps: 5   Home Layout: One level Home Equipment: Shower seat - built Medical sales representative (2 wheels);Rollator (4 wheels);Cane - single point      Prior Function Prior Level of Function : Needs assist             Mobility Comments: SPC until about 2 days ago when pt required significant assist for all mobility/transfers ADLs Comments: generally indep (assist for socks at baseline), requiring increased assist recently     Hand Dominance        Extremity/Trunk Assessment   Upper Extremity Assessment Upper Extremity Assessment: Defer to OT evaluation    Lower Extremity Assessment Lower Extremity Assessment: Generalized weakness    Cervical / Trunk Assessment Cervical / Trunk Assessment: Normal  Communication   Communication: No difficulties  Cognition Arousal/Alertness: Awake/alert Behavior During Therapy: WFL for tasks assessed/performed, Impulsive Overall Cognitive Status: Within Functional Limits for tasks assessed                                 General Comments: mildly impulsive requiring cues for safety        General Comments      Exercises     Assessment/Plan    PT Assessment Patient needs continued PT services  PT Problem List Decreased strength;Decreased activity tolerance;Decreased balance;Decreased mobility;Decreased safety awareness;Decreased knowledge of use of DME       PT Treatment Interventions DME instruction;Gait training;Functional mobility training;Therapeutic activities;Patient/family education;Stair training;Balance training;Therapeutic exercise    PT Goals (Current goals can be found in the Care Plan section)  Acute Rehab PT Goals Patient Stated Goal: to go home PT Goal Formulation: With patient/family Time For Goal Achievement: 12/10/21 Potential to Achieve Goals: Fair    Frequency Min 2X/week      Co-evaluation PT/OT/SLP Co-Evaluation/Treatment: Yes Reason for Co-Treatment: For patient/therapist safety;To address functional/ADL transfers PT goals addressed during session: Mobility/safety with mobility;Balance;Proper use of DME OT goals addressed during session: ADL's and self-care       AM-PAC PT "6 Clicks" Mobility  Outcome Measure Help needed turning from your back to your side while in a flat bed without using bedrails?: A Little Help needed moving from lying on your back to sitting on the side of a flat bed without using bedrails?: A Little Help needed moving to and from a bed to a chair (including a wheelchair)?: A Little Help needed standing up from a chair using your arms (e.g., wheelchair or bedside chair)?: A Little Help needed to walk in hospital room?: A Little Help needed climbing 3-5 steps with a railing? : A Lot 6 Click Score: 17    End of Session Equipment Utilized During Treatment: Gait belt Activity Tolerance: Patient limited by fatigue Patient left: in bed;with call bell/phone within reach;with bed alarm set;with family/visitor present Nurse Communication: Mobility status PT Visit Diagnosis: Unsteadiness on feet (R26.81);Other abnormalities of gait and mobility (R26.89);Muscle weakness (generalized) (M62.81);Difficulty in walking, not elsewhere classified (R26.2);Pain Pain - Right/Left: Left (back/ left shoulder) Pain - part of body: Shoulder    Time: 1151-1209 PT Time Calculation (min) (ACUTE ONLY): 18 min   Charges:   PT Evaluation $PT Eval Moderate Complexity: 1 Mod  Maurice Simpson, PT, GCS 11/26/21,1:28 PM

## 2021-11-26 NOTE — Consult Note (Signed)
[place] at [facility] Telephone:([phone]) [phone] [phone]) [phone]   Name: Maurice Simpson Date: 11/26/2021 MRN: [id number]  DOB: 23-Aug-1955  Patient Care Team: [name], NP as PCP - General (Nurse Practitioner) Telford Nab, RN as Oncology Nurse Navigator    REASON FOR CONSULTATION: Maurice Simpson is a 66 y.o. 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  He is status post 4 cycles of carbo etoposide and Tecentriq chemotherapy, currently on maintenance Tecentriq.  Patient has been feeling increasingly fatigued/weak with poor oral intake requiring frequent supportive care and IV fluids in clinic.  He was admitted to the hospital on 11/25/2021 as he had become so weak that he was nonambulatory.  CT of the chest showed postobstructive pneumonia with enlarging right hilar mass.  Palliative care was consulted to help address goals.  Palliative care was consulted to address goals.  SOCIAL HISTORY:     reports that he quit smoking about 2 weeks ago. His smoking use included cigarettes. He has a 102.00 pack-year smoking history. He has never used smokeless tobacco. He reports that he does not currently use alcohol. He reports that he does not use drugs.  Patient is married and lives at home with his wife.  He owns a tree service.  ADVANCE DIRECTIVES:  Not on file  CODE STATUS: Full code  PAST MEDICAL HISTORY: Past Medical History:  Diagnosis Date   Cancer ([place])    COPD (chronic obstructive pulmonary disease) ([place])    Dyspnea     PAST SURGICAL HISTORY:  Past Surgical History:  Procedure Laterality Date   CATARACT EXTRACTION Right 03/10/2021   IR IMAGING GUIDED PORT INSERTION  07/27/2021    HEMATOLOGY/ONCOLOGY HISTORY:  Oncology History  Small cell lung cancer ([place])  07/21/2021 Initial Diagnosis   Small cell lung cancer ([place])    07/21/2021 Cancer Staging   Staging form: Lung, AJCC 8th Edition - Clinical stage from 07/21/2021: Stage IVB (cT2, cN0, pM1c) - Signed by Sindy Guadeloupe, MD on 07/21/2021   08/03/2021 - 09/01/2021 Chemotherapy   Patient is on Treatment Plan : LUNG SCLC Carboplatin + Etoposide + Atezolizumab Induction q21d / Atezolizumab Maintenance q21d     09/01/2021 -  Chemotherapy   Patient is on Treatment Plan : LUNG SCLC Carboplatin + Etoposide + Atezolizumab Induction q21d x 4 cycles / Atezolizumab Maintenance q21d       ALLERGIES:  is allergic to bee venom.  MEDICATIONS:  Current Facility-Administered Medications  Medication Dose Route Frequency Provider Last Rate Last Admin   acetaminophen (TYLENOL) tablet 650 mg  650 mg Oral Q6H PRN Agbata, Tochukwu, MD   650 mg at 11/26/21 1207   albuterol (PROVENTIL) (2.5 MG/3ML) 0.083% nebulizer solution 2.5 mg  2.5 mg Nebulization Q6H PRN Agbata, Tochukwu, MD       budesonide (PULMICORT) nebulizer solution 0.25 mg  0.25 mg Nebulization BID Agbata, Tochukwu, MD   0.25 mg at 11/26/21 0734   enoxaparin (LOVENOX) injection 40 mg  40 mg Subcutaneous Q24H Agbata, Tochukwu, MD   40 mg at 11/25/21 2126   mirtazapine (REMERON) tablet 15 mg  15 mg Oral QHS Agbata, Tochukwu, MD   15 mg at 11/25/21 2121   ofloxacin (OCUFLOX) 0.3 % ophthalmic solution 5 drop  5 drop Right EAR BID Agbata, Tochukwu, MD   5 drop at 11/26/21 0904   OLANZapine (ZYPREXA) tablet 10 mg  10 mg Oral QHS Agbata, Tochukwu, MD   10 mg at 11/25/21 2121   ondansetron (ZOFRAN) tablet 4 mg  4 mg Oral Q6H PRN Agbata, Tochukwu, MD       Or   ondansetron (ZOFRAN) injection 4 mg  4 mg Intravenous Q6H PRN Agbata, Tochukwu, MD       oxyCODONE (Oxy IR/ROXICODONE) immediate release tablet 5 mg  5 mg Oral Q4H PRN Agbata, Tochukwu, MD       pantoprazole (PROTONIX) EC tablet 20 mg  20 mg Oral Daily Agbata, Tochukwu, MD   20 mg at 11/26/21 0904   piperacillin-tazobactam (ZOSYN) IVPB 3.375 g  3.375 g Intravenous Q8H  Lorin Picket, RPH 12.5 mL/hr at 11/26/21 0553 Infusion Verify at 11/26/21 0553   tobramycin (TOBREX) 0.3 % ophthalmic solution 2 drop  2 drop Both Eyes Q4H Agbata, Tochukwu, MD        VITAL SIGNS: BP 107/64 (BP Location: Right Arm)   Pulse 79   Temp 97.7 F (36.5 C)   Resp 18   Ht _0  (1.727 m)   Wt 166 lb 14.2 oz (75.7 kg)   SpO2 95%   BMI 25.38 kg/m  Filed Weights   11/25/21 1354 11/25/21 2048  Weight: 165 lb 5.5 oz (75 kg) 166 lb 14.2 oz (75.7 kg)    Estimated body mass index is 25.38 kg/m as calculated from the following:   Height as of this encounter: _1  (1.727 m).   Weight as of this encounter: 166 lb 14.2 oz (75.7 kg).  LABS: CBC:    Component Value Date/Time   WBC 14.4 (H) 11/26/2021 0506   HGB 9.0 (L) 11/26/2021 0506   HGB 18.0 (H) 04/30/2021 1136   HCT 28.2 (L) 11/26/2021 0506   HCT 53.3 (H) 04/30/2021 1136   PLT 232 11/26/2021 0506   PLT 156 04/30/2021 1136   MCV 91.0 11/26/2021 0506   MCV 91 04/30/2021 1136   MCV 94 01/21/2011 0134   NEUTROABS 9.2 (H) 11/10/2021 1339   NEUTROABS 4.8 04/30/2021 1136   LYMPHSABS 2.7 11/10/2021 1339   LYMPHSABS 2.9 04/30/2021 1136   MONOABS 0.8 11/10/2021 1339   EOSABS 0.1 11/10/2021 1339   EOSABS 0.3 04/30/2021 1136   BASOSABS 0.1 11/10/2021 1339   BASOSABS 0.1 04/30/2021 1136   Comprehensive Metabolic Panel:    Component Value Date/Time   NA 140 11/26/2021 0506   NA 136 04/30/2021 1136   NA 139 01/21/2011 0134   K 3.5 11/26/2021 0506   K 3.8 01/21/2011 0134   CL 108 11/26/2021 0506   CL 105 01/21/2011 0134   CO2 24 11/26/2021 0506   CO2 23 01/21/2011 0134   BUN 12 11/26/2021 0506   BUN 12 04/30/2021 1136   BUN 15 01/21/2011 0134   CREATININE 1.03 11/26/2021 0506   CREATININE 0.92 01/21/2011 0134   GLUCOSE 99 11/26/2021 0506   GLUCOSE 118 (H) 01/21/2011 0134   CALCIUM 8.5 (L) 11/26/2021 0506   CALCIUM 9.1 01/21/2011 0134   AST 25 11/10/2021 1339   AST 20 01/21/2011 0134   ALT 15 11/10/2021  1339   ALT 29 01/21/2011 0134   ALKPHOS 84 11/10/2021 1339   ALKPHOS 46 (L) 01/21/2011 0134   BILITOT 0.5 11/10/2021 1339   BILITOT 0.8 04/30/2021 1136   BILITOT 0.8 01/21/2011 0134   PROT 6.8 11/10/2021 1339   PROT 7.2 04/30/2021 1136   PROT 7.4 01/21/2011 0134   ALBUMIN 3.0 (L) 11/10/2021 1339   ALBUMIN  4.3 04/30/2021 1136   ALBUMIN 3.8 01/21/2011 0134    RADIOGRAPHIC STUDIES: CT CHEST WO CONTRAST  Result Date: 11/25/2021 CLINICAL DATA:  Metastatic lung cancer, weakness, cough EXAM: CT CHEST WITHOUT CONTRAST TECHNIQUE: Multidetector CT imaging of the chest was performed following the standard protocol without IV contrast. RADIATION DOSE REDUCTION: This exam was performed according to the departmental dose-optimization program which includes automated exposure control, adjustment of the mA and/or kV according to patient size and/or use of iterative reconstruction technique. COMPARISON:  09/18/2021 FINDINGS: Cardiovascular: Unenhanced imaging of the heart is unremarkable without pericardial effusion. Atherosclerosis of the coronary vasculature. Normal caliber of the thoracic aorta. Atherosclerosis of the aortic arch and at the origin of the great vessels. Right chest wall port via internal jugular approach, tip at the atriocaval junction. Mediastinum/Nodes: Thyroid, trachea, and esophagus are unremarkable. The right hilar mass seen on previous exam is more difficult to assess without IV contrast. A soft tissue mass obstructing the right middle and right lower lobe bronchi measures 3.1 x 3.3 cm reference image 95/4, with similar soft tissue mass on previous exam measuring 2.1 x 2.1 cm. Lungs/Pleura: Obstruction of the right middle and right lower lobe by the right hilar mass as described above, with extensive postobstructive changes and volume loss. Rounded peripheral area of consolidation within the right lower lobe measures up to 6.4 x 4.7 cm. Differential would include postobstructive pneumonia  or metastatic disease. Upper lobe predominant emphysema again noted. The right apical consolidation seen on prior CT has resolved in the interim. No effusion or pneumothorax. 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 No other acute upper abdominal process. Musculoskeletal: There are no acute or destructive bony lesions. Reconstructed images demonstrate no additional findings. IMPRESSION: 1. Enlarging right hilar mass, consistent with progression of malignancy. Exact measurements are difficult without IV contrast, but there is clear enlargement since prior study. 2. Obstruction of the right middle and right lower lobe bronchi by the enlarging right hilar mass, with extensive postobstructive changes in the right middle and right lower lobes. Dense peripheral right lower lobe consolidation favors postobstructive pneumonia over metastatic disease. 3. Incomplete visualization of the known adrenal metastases. 4. Aortic Atherosclerosis (ICD10-I70.0) and Emphysema (ICD10-J43.9). Electronically Signed   By: Randa Ngo M.D.   On: 11/25/2021 17:58   CT Head Wo Contrast  Result Date: 11/25/2021 CLINICAL DATA:  Increasing weakness for 1 day, stage IV lung cancer EXAM: CT HEAD WITHOUT CONTRAST TECHNIQUE: Contiguous axial images were obtained from the base of the skull through the vertex without intravenous contrast. RADIATION DOSE REDUCTION: This exam was performed according to the departmental dose-optimization program which includes automated exposure control, adjustment of the mA and/or kV according to patient size and/or use of iterative reconstruction technique. COMPARISON:  11/02/2021 FINDINGS: Brain: Stable 18 mm left frontal lobe mass with associated vasogenic edema, consistent with known intracranial metastatic disease. Linear calcification within the right occipital lobe consistent with residual of prior metastatic lesion at that location, also unchanged since recent  MRI. No acute infarct or hemorrhage. The lateral ventricles and midline structures are stable. No acute extra-axial fluid collections. No midline shift. Vascular: No hyperdense vessel or unexpected calcification. Skull: Normal. Negative for fracture or focal lesion. Sinuses/Orbits: Chronic right mastoid effusion. Paranasal sinuses are otherwise clear. Other: None. IMPRESSION: 1. Stable left frontal lobe lesion with adjacent vasogenic edema, as well as linear cortical calcification in the right occipital lobe, consistent with known intracranial metastases. No change since recent MRI. 2.  No acute infarct or hemorrhage. 3. Chronic right mastoid effusion. Electronically Signed   By: Randa Ngo M.D.   On: 11/25/2021 16:01   DG Chest 2 View  Result Date: 11/25/2021 CLINICAL DATA:  Generalized weakness EXAM: CHEST - 2 VIEW COMPARISON:  Chest radiograph done on 08/12/2021, CT chest done on 09/18/2021 FINDINGS: Cardiac size is within normal limits. There are no signs of pulmonary edema. Increased interstitial markings are seen in right lower lung field with interval progression. Rest of the lung fields are clear. Tip of right IJ chest port is seen at the junction of superior vena cava and right atrium. There is no pleural effusion or pneumothorax. Degenerative changes are noted in right AC joint. There is decreased distance between acromion and proximal humerus suggesting chronic tear of rotator cuff. IMPRESSION: Increased interstitial markings are seen in right lower lung fields suggesting scarring or interstitial pneumonia. Electronically Signed   By: Elmer Picker M.D.   On: 11/25/2021 15:31   MR Brain W Wo Contrast  Result Date: 11/03/2021 CLINICAL DATA:  Metastatic lung cancer. Assess response to treatment EXAM: MRI HEAD WITHOUT AND WITH CONTRAST TECHNIQUE: Multiplanar, multiecho pulse sequences of the brain and surrounding structures were obtained without and with intravenous contrast. CONTRAST:   40m GADAVIST GADOBUTROL 1 MMOL/ML IV SOLN COMPARISON:  MRI head with contrast 07/02/2021 FINDINGS: Brain: Marked improvement in widespread metastatic disease since the prior study 17 mm lesion left frontal lobe is significantly smaller. There are blood products and methemoglobin in the lesion without significant enhancement. 4 x 12 mm lesion in the right occipital parietal lobe significantly smaller. This is now hyperintense on T1 compatible with methemoglobin. No significant enhancement. Numerous additional enhancing lesions throughout the brain have resolved. No new lesion. Ventricle size normal. No midline shift. Negative for acute infarct Vascular: Normal arterial flow voids Skull and upper cervical spine: No focal skeletal lesion. Sinuses/Orbits: Mild mucosal edema paranasal sinuses. Bilateral mastoid effusion right greater than left. Cataract extraction on the right Other: None IMPRESSION: Marked improvement in widespread metastatic disease since the prior MRI of 07/02/2021. No new lesion. Electronically Signed   By: CFranchot GalloM.D.   On: 11/03/2021 12:01    PERFORMANCE STATUS (ECOG) : 3 - Symptomatic, >50% confined to bed  Review of Systems Unless otherwise noted, a complete review of systems is negative.  Physical Exam General: NAD Pulmonary: Unlabored Extremities: no edema, no joint deformities Skin: no rashes Neurological: Weakness but otherwise nonfocal  IMPRESSION: Patient known to me from the clinic.  He has had recent progressive weakness with poor oral intake requiring frequent supportive care visits for IV fluids.  Patient is only on maintenance Tecentriq at this point.  CT of the chest concerning for interval enlargement of his right hilar mass since previous scan in September.  Patient's wife has requested that patient not hear any negative news today.    I met with wife privately to discuss goals.  She understands that CT is suggestive of interval progression.  More  concerning, patient has had recent decline in performance status, essentially becoming nonambulatory prior to this hospitalization.  I explained that treatment decisions are greatly dependent upon performance status and patient has to be strong enough to seek further care.  She voiced a strong belief that patient will fully recover and eventually be cured of his cancer.  I explained that his prognosis is poor and that extensive stage small cell lung cancer is inherently incurable and will likely be the cause  of his eventual passing.  We discussed CODE STATUS and I explained the probable futility associated with resuscitative efforts.  Wife states the patient wants to remain a full code.  Wife described herself as a "believing believer" and says that she has strong faith that God will fully heal patient.   PLAN: -Full code/full scope of treatment -Will add labs for paraneoplastic syndrome in addition to a.m. cortisol/ACTH given progressive weakness -Will plan follow up in the clinic after patient is discharged  Case and plan discussed with Dr. Janese Banks  Time Total: 60 minutes  Visit consisted of counseling and education dealing with the complex and emotionally intense issues of symptom management and palliative care in the setting of serious and potentially life-threatening illness.Greater than 50%  of this time was spent counseling and coordinating care related to the above assessment and plan.  Signed by: Altha Harm, PhD, NP-C

## 2021-11-26 NOTE — Progress Notes (Addendum)
Initial Nutrition Assessment  DOCUMENTATION CODES:   Non-severe (moderate) malnutrition in context of chronic illness  INTERVENTION:   -Ensure Enlive po QID, each supplement provides 350 kcal and 20 grams of protein.  -Magic cup TID with meals, each supplement provides 290 kcal and 9 grams of protein  -MVI with minerals daily -Continue with regular diet of regular -Reached out to cancer center RD, palliative care, and hospitalist via secure chart to discuss recommendation of PEG placement; no plans for PEG per oncology as this would not change propgnosis  NUTRITION DIAGNOSIS:   Moderate Malnutrition related to chronic illness (metastatic stage IV lung cancer) as evidenced by percent weight loss, mild fat depletion, mild muscle depletion, moderate muscle depletion.  GOAL:   Patient will meet greater than or equal to 90% of their needs  MONITOR:   PO intake, Supplement acceptance  REASON FOR ASSESSMENT:   Malnutrition Screening Tool, Consult Assessment of nutrition requirement/status  ASSESSMENT:   Pt with medical history significant for metastatic small cell lung cancer with mets to the brain, omentum, pancreas and adrenal glands currently on chemotherapy and immunotherapy, history of COPD who was brought in for evaluation of weakness.  Pt admitted with lobar pneumonia and small cell lung cancer.   Reviewed I/O's: +2.3 L x 24 hours   UOP: 100 ml x 24 hours   Spoke with pt and wife at bedside. Wife provided most of the history. She shares that pt has experienced decline in oral intake over the past 5 weeks, after last chemotherapy treatment. She reports that pt developed oral thrush, which has been improving. Pt has taste and smell aversions to foods; she shares that pt is hungry and wants to eat, but unable to tolerate most foods (other than Ensure and vanilla pudding) secondary to smell. Pt tried to eat a [place] sandwich and chips last night, but could not eat it due to odors  ("even smelling a cracker sets him off"). Pt able to eat some vanilla pudding and 3/4 of an Ensure supplement.   Pt has had increased weakness. Pt shares "my legs are like jello" and has been needing to use a walker due to being unsteady on his feet. Pt wife chares that the Magic Mouthwash and appetite stimulant have been helpful to pt. Ordered lunch tray per her request.   Reviewed wt hx; pt has experienced a 16.7% wt loss over the past 3 months, which is significant for time frame.   Case discussed with RN and RD at cancer center. Reached out to hospitalist and palliative care team regarding possible PEG placement, due to concern of further nutritional decline if further treatments are pursued. Per oncology, pt is poor prognosis and PEG will not change this. Plan to transition to hospice if pt were to further decline.   Medications reviewed and include remeron.   Per palliative care notes, plan for full scope treatment at this time.   Labs reviewed.   NUTRITION - FOCUSED PHYSICAL EXAM:  Flowsheet Row Most Recent Value  Orbital Region Mild depletion  Upper Arm Region Mild depletion  Thoracic and Lumbar Region No depletion  Buccal Region Mild depletion  Temple Region Mild depletion  Clavicle Bone Region Mild depletion  Clavicle and Acromion Bone Region No depletion  Scapular Bone Region No depletion  Dorsal Hand Moderate depletion  Patellar Region Moderate depletion  Anterior Thigh Region Moderate depletion  Posterior Calf Region Moderate depletion  Edema (RD Assessment) None  Hair Reviewed  Eyes Reviewed  Mouth  Reviewed  Skin Reviewed  Nails Reviewed       Diet Order:   Diet Order             Diet regular Room service appropriate? Yes; Fluid consistency: Thin  Diet effective now                   EDUCATION NEEDS:   Education needs have been addressed  Skin:  Skin Assessment: Reviewed RN Assessment  Last BM:  Unknown  Height:   Ht Readings from Last 1  Encounters:  11/25/21 5\' 8"  (1.727 m)    Weight:   Wt Readings from Last 1 Encounters:  11/25/21 75.7 kg    Ideal Body Weight:  70 kg  BMI:  Body mass index is 25.38 kg/m.  Estimated Nutritional Needs:   Kcal:  2100-2300  Protein:  105-120 grams  Fluid:  > 2 L    Loistine Chance, RD, LDN, [place] Registered Dietitian II Certified Diabetes Care and Education Specialist Please refer to [facility] for RD and/or RD on-call/weekend/after hours pager

## 2021-11-26 NOTE — Evaluation (Signed)
Occupational Therapy Evaluation Patient Details Name: Maurice Simpson MRN: [id number] DOB: 10-20-1955 Today's Date: 11/26/2021   History of Present Illness 66 y.o. 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   Clinical Impression   Pt was seen for OT evaluation this date. Prior to hospital admission, pt was generally able to complete basic ADL without direct assist and ambulating with SPC, until the past couple days when he became significantly weaker per family, requiring significant assist for all mobility and ADL. Pt lives with his spouse in 1 story home with 5 steps to enter. Pt presents to acute OT demonstrating impaired ADL performance and functional mobility 2/2 impaired safety awareness, strength, activity tolerance, and balance (notable slight R lateral lean) (See OT problem list). Pt currently requires CGA+2 for ADL transfers with VC for hand placement and RW mgt, MOD A for LB dressing (MAX A for donning socks), MAX A for pericare after toileting, and MIN A +2 for mobility EOB<>bathroom. Pt/family educated in AE/DME to maximize safety. Pt would benefit from skilled OT services to address noted impairments and functional limitations (see below for any additional details) in order to maximize safety and independence while minimizing falls risk and caregiver burden. Upon hospital discharge, recommend HHOT to maximize pt safety and return to functional independence during meaningful occupations of daily life.   Patient suffers from metastatic cancer and lobar pneumonia which impairs his ability to perform daily activities like toileting, dressing, grooming, bathing in the home. A cane, walker, crutch will not resolve the patient's issue with performing activities of daily living. A  lightweight wheelchair and cushion is required/recommended and will allow patient to safely perform daily activities.    Patient can safely propel the wheelchair in the home or has a caregiver who can provide assistance.      Recommendations for follow up therapy are one component of a multi-disciplinary discharge planning process, led by the attending physician.  Recommendations may be updated based on patient status, additional functional criteria and insurance authorization.   Follow Up Recommendations  Home health OT     Assistance Recommended at Discharge Frequent or constant Supervision/Assistance  Patient can return home with the following A little help with walking and/or transfers;A lot of help with bathing/dressing/bathroom;Assistance with cooking/housework;Assist for transportation;Help with stairs or ramp for entrance    Functional Status Assessment  Patient has had a recent decline in their functional status and demonstrates the ability to make significant improvements in function in a reasonable and predictable amount of time.  Equipment Recommendations  BSC/3in1;Wheelchair (measurements OT);Wheelchair cushion (measurements OT);Other (comment) (ramp)    Recommendations for Other Services       Precautions / Restrictions Precautions Precautions: Fall Restrictions Weight Bearing Restrictions: No      Mobility Bed Mobility Overal bed mobility: Needs Assistance Bed Mobility: Supine to Sit, Sit to Supine     Supine to sit: Min assist Sit to supine: Min assist        Transfers Overall transfer level: Needs assistance Equipment used: Rolling walker (2 wheels) Transfers: Sit to/from Stand Sit to Stand: Min guard, +2 physical assistance, +2 safety/equipment           General transfer comment: VC for sequencing, hand placement      Balance Overall balance assessment: Needs assistance Sitting-balance  support: Single extremity supported, No upper extremity  supported, Feet supported Sitting balance-Leahy Scale: Fair   Postural control: Right lateral lean Standing balance support: Reliant on assistive device for balance, Bilateral upper extremity supported, During functional activity Standing balance-Leahy Scale: Poor Standing balance comment: slight R lateral lean                           ADL either performed or assessed with clinical judgement   ADL Overall ADL's : Needs assistance/impaired                                       General ADL Comments: Pt requires MAX A for donning socks, MOD A for LB dressing otherwise, MAX A for pericare, CGA-MIN A for ADL transfers, MIN A +2 for ADL mobility with RW     Vision         Perception     Praxis      Pertinent Vitals/Pain Pain Assessment Pain Assessment: 0-10 Pain Score: 10-Worst pain ever Pain Location: back, L shoulder Pain Descriptors / Indicators: Aching Pain Intervention(s): Limited activity within patient's tolerance, Monitored during session, Repositioned, Patient requesting pain meds-RN notified     Hand Dominance     Extremity/Trunk Assessment Upper Extremity Assessment Upper Extremity Assessment: Defer to OT evaluation   Lower Extremity Assessment Lower Extremity Assessment: Generalized weakness   Cervical / Trunk Assessment Cervical / Trunk Assessment: Normal   Communication Communication Communication: No difficulties   Cognition Arousal/Alertness: Awake/alert Behavior During Therapy: WFL for tasks assessed/performed, Impulsive                                   General Comments: mildly impulsive requiring cues for safety     General Comments       Exercises Other Exercises Other Exercises: Pt/family educated in AE/DME for improved pt safety with ADL/mobility   Shoulder Instructions      Home Living Family/patient expects to be discharged to:: Unsure Living Arrangements: Spouse/significant  other Available Help at Discharge: Family;Available 24 hours/day Type of Home: House Home Access: Stairs to enter [employer] of Steps: 5 Entrance Stairs-Rails: Right;Left Home Layout: One level     Bathroom Shower/Tub: Occupational psychologist: Standard     Home Equipment: Shower seat - built Medical sales representative (2 wheels);Rollator (4 wheels);Cane - single point          Prior Functioning/Environment Prior Level of Function : Needs assist             Mobility Comments: SPC until about 2 days ago when pt required significant assist for all mobility/transfers ADLs Comments: generally indep (assist for socks at baseline), requiring increased assist recently        OT Problem List: Decreased strength;Pain;Decreased safety awareness;Decreased activity tolerance;Impaired balance (sitting and/or standing);Decreased knowledge of use of DME or AE      OT Treatment/Interventions: Self-care/ADL training;Therapeutic exercise;Therapeutic activities;DME and/or AE instruction;Patient/family education;Energy conservation;Balance training    OT Goals(Current goals can be found in the care plan section) Acute Rehab OT Goals Patient Stated Goal: go home tomorrow OT Goal Formulation: With patient/family Time For Goal Achievement: 12/10/21 Potential to Achieve Goals: Good ADL Goals Pt Will Perform Upper Body Dressing: sitting;with set-up;with supervision Pt Will Perform Lower Body Dressing: sit to/from  stand;with caregiver independent in assisting Pt Will Transfer to Toilet: with min guard assist;ambulating (BSC over toilet, LRAD) Pt Will Perform Toileting - Clothing Manipulation and hygiene: with supervision;sitting/lateral leans Additional ADL Goal #1: Pt will complete ADL transfers with LRAD and CGA from caregiver.  OT Frequency: Min 2X/week    Co-evaluation PT/OT/SLP Co-Evaluation/Treatment: Yes Reason for Co-Treatment: For patient/therapist safety;To address  functional/ADL transfers PT goals addressed during session: Mobility/safety with mobility;Balance;Proper use of DME OT goals addressed during session: ADL's and self-care      AM-PAC OT "6 Clicks" Daily Activity     Outcome Measure Help from another person eating meals?: None Help from another person taking care of personal grooming?: A Little Help from another person toileting, which includes using toliet, bedpan, or urinal?: A Lot Help from another person bathing (including washing, rinsing, drying)?: A Lot Help from another person to put on and taking off regular upper body clothing?: A Little Help from another person to put on and taking off regular lower body clothing?: A Lot 6 Click Score: 16   End of Session Equipment Utilized During Treatment: Gait belt;Rolling walker (2 wheels) Nurse Communication: Patient requests pain meds  Activity Tolerance: Patient tolerated treatment well Patient left: in bed;with call bell/phone within reach;with bed alarm set;with family/visitor present;with nursing/sitter in room  OT Visit Diagnosis: Other abnormalities of gait and mobility (R26.89);Muscle weakness (generalized) (M62.81);Pain Pain - Right/Left: Left Pain - part of body: Shoulder                Time: 4944-9675 OT Time Calculation (min): 29 min Charges:  OT General Charges $OT Visit: 1 Visit OT Evaluation $OT Eval Moderate Complexity: 1 Mod OT Treatments $Self Care/Home Management : 8-22 mins  Ardeth Perfect., MPH, MS, OTR/L ascom [phone] 11/26/21, 1:24 PM

## 2021-11-26 NOTE — Progress Notes (Signed)
[facility]) Hospital Liaison note:  This is a pending outpatient-based Palliative Care patient. Will continue to follow for disposition.  Please call with any outpatient palliative questions or concerns.  Thank you, Lorelee Market, LPN [facility] Liaison [phone]

## 2021-11-26 NOTE — Hospital Course (Signed)
Maurice Simpson is a 66 y.o. 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  They also reported poor tolerance for PO intake recently due to abnormal taste of foods related to chemotherapy and radiation treatments.    He was found to have post-obstructive pneumonia involving the right middle and lower lobes due to enlarging hilar mass.  Started on IV Zosyn and admitted to medicine service. Oncology and palliative care consulted.

## 2021-11-26 NOTE — Consult Note (Addendum)
Hematology/Oncology Consult Note [facility] [phone] [phone]  Patient Care Team: [name], NP as PCP - General (Nurse Practitioner) Telford Nab, RN as Oncology Nurse Navigator   Name of the patient: Maurice Simpson  [id number]  02/01/1955   Reason for referral- Post obstructive pneumonia   Referring physician- Dr. 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   He presented to ED on 11/25/21 for worsening generalized weakness, becoming nearly bed bound, worsening oral intake, and audible breathing, worsening cough.   11/25/21- CT Chest WO Contrast Mediastinum- mass obstructing right middle and right lower lobe measuring 3.1 x 3.3, previously 2.1 x 2.1 Lungs: Right lower lobe showed 6.4 x 4.7 area of consolidation concerning for metastatic disease vs metastatic disease.  Adrenal masses not characterized.   Patient says he feels poorly. Weakness had worsened over past few days. He feels somewhat improved since starting antibiotics. He feels that cancer must be worsening to account for how poorly he feels. Wife, Maurice Simpson, disagrees and feels that it is related to pneumonia, ongoing debility, loss of appetite, deconditioning. He has now quit smoking. He says he's resting well in the hospital. Continues to eat very little. He has met with physical therapy and OT earlier today and says it went well. He requires constant assistance  ECOG PS- 3-4  Pain scale- 0  Review of systems- Review of Systems  Constitutional:  Positive for malaise/fatigue. Negative for chills, fever and weight loss.  HENT:  Positive for hearing loss. Negative for  nosebleeds, sore throat and tinnitus.   Eyes:  Negative for blurred vision and double vision.  Respiratory:  Positive for cough and sputum production. Negative for hemoptysis, shortness of breath and wheezing.   Cardiovascular:  Negative for chest pain, palpitations and leg swelling.  Gastrointestinal:  Negative for abdominal pain, blood in stool, constipation, diarrhea, melena, nausea and vomiting.  Genitourinary:  Negative for dysuria and urgency.  Musculoskeletal:  Negative for back pain, falls, joint pain and myalgias.  Skin:  Negative for itching and rash.  Neurological:  Positive for weakness. Negative for dizziness, tingling, sensory change, loss of consciousness and headaches.  Endo/Heme/Allergies:  Negative for environmental allergies. Does not bruise/bleed easily.  Psychiatric/Behavioral:  Negative for depression. The patient is not nervous/anxious and does not have insomnia.     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   Hypokalemia due to inadequate potassium intake 11/03/2021   Encounter for antineoplastic chemotherapy 08/25/2021   Lung cancer metastatic to brain ([place]) 08/25/2021   Thrush 08/25/2021   Palliative care encounter    Febrile neutropenia ([place]) 08/12/2021   Thrombocytopenia ([place]) 08/12/2021   Small cell lung cancer ([place]) 07/21/2021   Weight loss, abnormal 06/24/2021   Cardiovascular risk factor 05/12/2021   Bee sting allergy 05/12/2021   Obesity 07/20/2019   Nicotine dependence, cigarettes, uncomplicated 62/13/0865   Family history of colon cancer 07/20/2019   COPD (chronic obstructive pulmonary disease) ([place]) 07/20/2019   Preventative health care 07/20/2019     Past Medical  History:  Diagnosis Date   Cancer ([place])    COPD (chronic obstructive  pulmonary disease) ([facility])    Dyspnea      Past Surgical History:  Procedure Laterality Date   CATARACT EXTRACTION Right 03/10/2021   IR IMAGING GUIDED PORT INSERTION  07/27/2021    Social History   Socioeconomic History   Marital status: Married    Spouse name: Not on file   Number of children: Not on file   Years of education: Not on file   Highest education level: Not on file  Occupational History   Not on file  Tobacco Use   Smoking status: Former    Packs/day: 2.00    Years: 51.00    Total pack years: 102.00    Types: Cigarettes    Quit date: 11/07/2021    Years since quitting: 0.0   Smokeless tobacco: Never  Vaping Use   Vaping Use: Never used  Substance and Sexual Activity   Alcohol use: Not Currently   Drug use: Never   Sexual activity: Yes  Other Topics Concern   Not on file  Social History Narrative   Not on file   Social Determinants of Health   Financial Resource Strain: Low Risk  (07/13/2021)   Overall Financial Resource Strain (CARDIA)    Difficulty of Paying Living Expenses: Not hard at all  Food Insecurity: No Food Insecurity (11/25/2021)   Hunger Vital Sign    Worried About Running Out of Food in the Last Year: Never true    [place] in the Last Year: Never true  Transportation Needs: No Transportation Needs (11/25/2021)   PRAPARE - Hydrologist (Medical): No    Lack of Transportation (Non-Medical): No  Physical Activity: Inactive (07/20/2019)   Exercise Vital Sign    Days of Exercise per Week: 0 days    Minutes of Exercise per Session: 0 min  Stress: Stress Concern Present (07/13/2021)   [place]    Feeling of Stress : To some extent  Social Connections: Moderately Isolated (07/13/2021)   Social Connection and Isolation Panel [NHANES]    Frequency of Communication with Friends and Family: More than three times a week    Frequency of Social  Gatherings with Friends and Family: Twice a week    Attends Religious Services: Never    Marine scientist or Organizations: No    Attends Archivist Meetings: Never    Marital Status: Married  Human resources officer Violence: Not At Risk (11/25/2021)   Humiliation, Afraid, Rape, and Kick questionnaire    Fear of Current or Ex-Partner: No    Emotionally Abused: No    Physically Abused: No    Sexually Abused: No     Family History  Problem Relation Age of Onset   Diabetes Mother    Hypertension Mother    Diabetes Father    Hypertension Father    Diabetes Sister    Cancer - Colon Cousin      Current Facility-Administered Medications:    acetaminophen (TYLENOL) tablet 650 mg, 650 mg, Oral, Q6H PRN, Agbata, Tochukwu, MD, 650 mg at 11/26/21 1207   albuterol (PROVENTIL) (2.5 MG/3ML) 0.083% nebulizer solution 2.5 mg, 2.5 mg, Nebulization, Q6H PRN, Agbata, Tochukwu, MD   budesonide (PULMICORT) nebulizer solution 0.25 mg, 0.25 mg, Nebulization, BID, Agbata, Tochukwu, MD, 0.25 mg at 11/26/21 0734   enoxaparin (LOVENOX) injection 40 mg, 40 mg, Subcutaneous, Q24H,  Collier Bullock, MD, 40 mg at 11/25/21 2126   feeding supplement (ENSURE ENLIVE / ENSURE PLUS) liquid 237 mL, 237 mL, Oral, QID, Arbutus Ped, Kelly A, DO   mirtazapine (REMERON) tablet 15 mg, 15 mg, Oral, QHS, Agbata, Tochukwu, MD, 15 mg at 11/25/21 2121   multivitamin with minerals tablet 1 tablet, 1 tablet, Oral, Daily, Nicole Kindred A, DO   ofloxacin (OCUFLOX) 0.3 % ophthalmic solution 5 drop, 5 drop, Right EAR, BID, Agbata, Tochukwu, MD, 5 drop at 11/26/21 0904   OLANZapine (ZYPREXA) tablet 10 mg, 10 mg, Oral, QHS, Agbata, Tochukwu, MD, 10 mg at 11/25/21 2121   ondansetron (ZOFRAN) tablet 4 mg, 4 mg, Oral, Q6H PRN **OR** ondansetron (ZOFRAN) injection 4 mg, 4 mg, Intravenous, Q6H PRN, Agbata, Tochukwu, MD   oxyCODONE (Oxy IR/ROXICODONE) immediate release tablet 5 mg, 5 mg, Oral, Q4H PRN, Agbata, Tochukwu, MD    pantoprazole (PROTONIX) EC tablet 20 mg, 20 mg, Oral, Daily, Agbata, Tochukwu, MD, 20 mg at 11/26/21 0904   piperacillin-tazobactam (ZOSYN) IVPB 3.375 g, 3.375 g, Intravenous, Q8H, Lorin Picket, RPH, Last Rate: 12.5 mL/hr at 11/26/21 1434, 3.375 g at 11/26/21 1434   tobramycin (TOBREX) 0.3 % ophthalmic solution 2 drop, 2 drop, Both Eyes, Q4H, Agbata, Tochukwu, MD   Physical exam:  Vitals:   11/26/21 0939 11/26/21 1156 11/26/21 1610 11/26/21 1610  BP: 109/64 107/64 119/67 119/67  Pulse: 87 79 81 81  Resp: _0 Temp: 97.7 F (36.5 C) 97.7 F (36.5 C) (!) 97.5 F (36.4 C) (!) 97.5 F (36.4 C)  TempSrc:      SpO2: 94% 95% 97% 98%  Weight:      Height:       Physical Exam Vitals reviewed.  Constitutional:      General: He is not in acute distress.    Appearance: He is well-developed. He is ill-appearing.  HENT:     Head: Normocephalic and atraumatic.     Ears:     Comments: Hard of hearing. Hearing essentially absent in right ear    Mouth/Throat:     Pharynx: No oropharyngeal exudate.  Eyes:     General: No scleral icterus.    Conjunctiva/sclera: Conjunctivae normal.  Cardiovascular:     Rate and Rhythm: Normal rate and regular rhythm.  Pulmonary:     Effort: Pulmonary effort is normal.     Breath sounds: No wheezing.     Comments: Diminished bilaterally but R > L Abdominal:     General: Bowel sounds are normal. There is no distension.     Palpations: Abdomen is soft.     Tenderness: There is no abdominal tenderness.  Musculoskeletal:        General: Normal range of motion.  Lymphadenopathy:     Cervical: No cervical adenopathy.  Skin:    General: Skin is warm and dry.     Coloration: Skin is pale.  Neurological:     Mental Status: He is alert and oriented to person, place, and time.  Psychiatric:        Behavior: Behavior normal.        Latest Ref Rng & Units 11/27/2021    6:30 AM 11/26/2021    5:06 AM 11/25/2021    2:07 PM  CBC  WBC 4.0 -  10.5 K/uL 8.9  14.4  23.7   Hemoglobin 13.0 - 17.0 g/dL 9.2  9.0  10.9   Hematocrit 39.0 - 52.0 % 28.6  28.2  34.0   Platelets  150 - 400 K/uL 228  232  272       Latest Ref Rng & Units 11/26/2021    5:06 AM 11/25/2021    2:07 PM 11/10/2021    1:39 PM  CMP  Glucose 70 - 99 mg/dL 99  130  132   BUN 8 - 23 mg/dL _0 Creatinine 0.61 - 1.24 mg/dL 1.03  0.94  1.11   Sodium 135 - 145 mmol/L 140  140  139   Potassium 3.5 - 5.1 mmol/L 3.5  3.6  3.0   Chloride 98 - 111 mmol/L 108  106  106   CO2 22 - 32 mmol/L _1 Calcium 8.9 - 10.3 mg/dL 8.5  9.2  8.7   Total Protein 6.5 - 8.1 g/dL   6.8   Total Bilirubin 0.3 - 1.2 mg/dL   0.5   Alkaline Phos 38 - 126 U/L   84   AST 15 - 41 U/L   25   ALT 0 - 44 U/L   15    CT CHEST WO CONTRAST  Result Date: 11/25/2021 CLINICAL DATA:  Metastatic lung cancer, weakness, cough EXAM: CT CHEST WITHOUT CONTRAST TECHNIQUE: Multidetector CT imaging of the chest was performed following the standard protocol without IV contrast. RADIATION DOSE REDUCTION: This exam was performed according to the departmental dose-optimization program which includes automated exposure control, adjustment of the mA and/or kV according to patient size and/or use of iterative reconstruction technique. COMPARISON:  09/18/2021 FINDINGS: Cardiovascular: Unenhanced imaging of the heart is unremarkable without pericardial effusion. Atherosclerosis of the coronary vasculature. Normal caliber of the thoracic aorta. Atherosclerosis of the aortic arch and at the origin of the great vessels. Right chest wall port via internal jugular approach, tip at the atriocaval junction. Mediastinum/Nodes: Thyroid, trachea, and esophagus are unremarkable. The right hilar mass seen on previous exam is more difficult to assess without IV contrast. A soft tissue mass obstructing the right middle and right lower lobe bronchi measures 3.1 x 3.3 cm reference image 95/4, with similar soft tissue mass on  previous exam measuring 2.1 x 2.1 cm. Lungs/Pleura: Obstruction of the right middle and right lower lobe by the right hilar mass as described above, with extensive postobstructive changes and volume loss. Rounded peripheral area of consolidation within the right lower lobe measures up to 6.4 x 4.7 cm. Differential would include postobstructive pneumonia or metastatic disease. Upper lobe predominant emphysema again noted. The right apical consolidation seen on prior CT has resolved in the interim. No effusion or pneumothorax. 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 No other acute upper abdominal process. Musculoskeletal: There are no acute or destructive bony lesions. Reconstructed images demonstrate no additional findings. IMPRESSION: 1. Enlarging right hilar mass, consistent with progression of malignancy. Exact measurements are difficult without IV contrast, but there is clear enlargement since prior study. 2. Obstruction of the right middle and right lower lobe bronchi by the enlarging right hilar mass, with extensive postobstructive changes in the right middle and right lower lobes. Dense peripheral right lower lobe consolidation favors postobstructive pneumonia over metastatic disease. 3. Incomplete visualization of the known adrenal metastases. 4. Aortic Atherosclerosis (ICD10-I70.0) and Emphysema (ICD10-J43.9). Electronically Signed   By: Randa Ngo M.D.   On: 11/25/2021 17:58   CT Head Wo Contrast  Result Date: 11/25/2021 CLINICAL DATA:  Increasing weakness for 1 day, stage IV lung cancer EXAM: CT HEAD WITHOUT CONTRAST TECHNIQUE:  Contiguous axial images were obtained from the base of the skull through the vertex without intravenous contrast. RADIATION DOSE REDUCTION: This exam was performed according to the departmental dose-optimization program which includes automated exposure control, adjustment of the mA and/or kV according to patient size and/or use of  iterative reconstruction technique. COMPARISON:  11/02/2021 FINDINGS: Brain: Stable 18 mm left frontal lobe mass with associated vasogenic edema, consistent with known intracranial metastatic disease. Linear calcification within the right occipital lobe consistent with residual of prior metastatic lesion at that location, also unchanged since recent MRI. No acute infarct or hemorrhage. The lateral ventricles and midline structures are stable. No acute extra-axial fluid collections. No midline shift. Vascular: No hyperdense vessel or unexpected calcification. Skull: Normal. Negative for fracture or focal lesion. Sinuses/Orbits: Chronic right mastoid effusion. Paranasal sinuses are otherwise clear. Other: None. IMPRESSION: 1. Stable left frontal lobe lesion with adjacent vasogenic edema, as well as linear cortical calcification in the right occipital lobe, consistent with known intracranial metastases. No change since recent MRI. 2. No acute infarct or hemorrhage. 3. Chronic right mastoid effusion. Electronically Signed   By: Randa Ngo M.D.   On: 11/25/2021 16:01   DG Chest 2 View  Result Date: 11/25/2021 CLINICAL DATA:  Generalized weakness EXAM: CHEST - 2 VIEW COMPARISON:  Chest radiograph done on 08/12/2021, CT chest done on 09/18/2021 FINDINGS: Cardiac size is within normal limits. There are no signs of pulmonary edema. Increased interstitial markings are seen in right lower lung field with interval progression. Rest of the lung fields are clear. Tip of right IJ chest port is seen at the junction of superior vena cava and right atrium. There is no pleural effusion or pneumothorax. Degenerative changes are noted in right AC joint. There is decreased distance between acromion and proximal humerus suggesting chronic tear of rotator cuff. IMPRESSION: Increased interstitial markings are seen in right lower lung fields suggesting scarring or interstitial pneumonia. Electronically Signed   By: Elmer Picker M.D.   On: 11/25/2021 15:31   MR Brain W Wo Contrast  Result Date: 11/03/2021 CLINICAL DATA:  Metastatic lung cancer. Assess response to treatment EXAM: MRI HEAD WITHOUT AND WITH CONTRAST TECHNIQUE: Multiplanar, multiecho pulse sequences of the brain and surrounding structures were obtained without and with intravenous contrast. CONTRAST:  38m GADAVIST GADOBUTROL 1 MMOL/ML IV SOLN COMPARISON:  MRI head with contrast 07/02/2021 FINDINGS: Brain: Marked improvement in widespread metastatic disease since the prior study 17 mm lesion left frontal lobe is significantly smaller. There are blood products and methemoglobin in the lesion without significant enhancement. 4 x 12 mm lesion in the right occipital parietal lobe significantly smaller. This is now hyperintense on T1 compatible with methemoglobin. No significant enhancement. Numerous additional enhancing lesions throughout the brain have resolved. No new lesion. Ventricle size normal. No midline shift. Negative for acute infarct Vascular: Normal arterial flow voids Skull and upper cervical spine: No focal skeletal lesion. Sinuses/Orbits: Mild mucosal edema paranasal sinuses. Bilateral mastoid effusion right greater than left. Cataract extraction on the right Other: None IMPRESSION: Marked improvement in widespread metastatic disease since the prior MRI of 07/02/2021. No new lesion. Electronically Signed   By: CFranchot GalloM.D.   On: 11/03/2021 12:01    Assessment and plan- Patient is a 66y.o. male currently admitted for postobstructive pneumonia.   Extensive Stage Small Cell Lung cancer- cwith metastatic disease to brain, adrenals, pancreas, and omentum. Currently s/p 4 cycles of carbo-etoposide-tecentriq, now s/p cycle 2 of maintenance tecentriq. Treatment  given with palliative intent. September imaging after 4 cycles of chemo consistent with improvement in disease burden. Hospital imaging concerning for progressive disease vs acute infection  on chronic changes which is more likely. I reviewed with Dr Janese Banks who agrees. Would plan to continue treatments once patient recovers from acute infection with plan for short interval imaging for re-evaluation in 2 months- around January 2024.  Lobar pneumonia- imaging showed worsening obstruction of right hilar mass obstructing RLL and RLL with associated leukocytosis. Currently receiving iv antibiotics and supportive care.  Weakness- acute on chronic. Likely worse due to acute infection. Appreciate PT and OT as well as home DME recommendations. Avoid palliative steroids due to immunotherapy.  Malnutrition- recommend dietary evaluation Goals of care- patient's wife does not want patient to hear 'bad news' and wants to encourage him to remain optimistic. They understand treatment is given with palliative intent but wife hopeful for 'miracle'. Patient concerned of cancer progression. Appreciate input with palliative care both inpatient and outpatient.   Will follow.   Thank you for this kind referral and the opportunity to participate in the care of this pleasant patient.    Visit Diagnosis 1. Generalized weakness   2. Pneumonia of right lower lobe due to infectious organism     Beckey Rutter, [place], AGNP-C [place] at [facility] [phone] (clinic) 11/26/2021

## 2021-11-26 NOTE — Progress Notes (Signed)
  Progress Note   Patient: Maurice Simpson DOB: 1955-11-14 DOA: 11/25/2021     1 DOS: the patient was seen and examined on 11/26/2021   Brief hospital course: Maurice Simpson is a 66 y.o. 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  They also reported poor tolerance for PO intake recently due to abnormal taste of foods related to chemotherapy and radiation treatments.    He was found to have post-obstructive pneumonia involving the right middle and lower lobes due to enlarging hilar mass.  Started on IV Zosyn and admitted to medicine service. Oncology and palliative care consulted.  Assessment and Plan: * Lobar pneumonia ([place]) Patient noted to have postobstructive pneumonia and enlarging right hilar mass obstructing the right middle and right lower lobe bronchus.  Also with worsening leukocytosis on admission. --Continue IV Zosyn --Supportive care per orders with mucolytics, pulmonary hygiene etc --Oncology and palliative consulted  Small cell lung cancer ([place]) Patient has stage IV small cell lung cancer with metastatic disease to the brain, to the adrenal, to the pancreas and omentum. Currently receiving chemotherapy and immunotherapy Consulted oncology and palliative care  COPD (chronic obstructive pulmonary disease) ([place]) Not acutely exacerbated Continue as needed bronchodilator therapy and inhaled steroids  Generalized weakness Due to metastatic disease, poor PO intake, now with acute illness / infection. --PT evaluation --Wheelchair ordered per wife's request --TOC consulted --Fall precautions  Weakness generalized Most likely related to underlying metastatic cancer  Palliative care  consult        Subjective: Pt seen with wife at bedside today.  Pt himself is minimally interactive and denies complaints.  Wife explains his recent progressive weakness and their needs for going home including needing help with a ramp to enter the home, and a wheelchair as they've been using a walker that can be used like a wheelchair.     Physical Exam: Vitals:   11/26/21 0447 11/26/21 0736 11/26/21 0939 11/26/21 1156  BP: 105/60  109/64 107/64  Pulse: 83  87 79  Resp: 18  16 18   Temp: 97.8 F (36.6 C)  97.7 F (36.5 C) 97.7 F (36.5 C)  TempSrc:      SpO2: 94% 92% 94% 95%  Weight:      Height:      General exam: awake, alert, no acute distress HEENT: moist mucus membranes, hearing grossly normal  Respiratory system: Lungs diminished right more than left, no wheezes, normal respiratory effort. Cardiovascular system: normal S1/S2, RRR, no pedal edema.   Gastrointestinal system: soft, NT, ND, no HSM felt, +bowel sounds. Central nervous system: A&O x3. no gross focal neurologic deficits, normal speech Extremities: moves all, no edema, normal tone Skin: dry, intact, normal temperature Psychiatry: normal mood, flat affect, unable to assess judgement and insight as pt minimally interactive   Data Reviewed:  Notable labs ---  WBC improved 23.7 >> 14.4, Hbg 9.0 from 10.9,    Family Communication: wife at bedside  Disposition: Status is: Inpatient Remains inpatient appropriate because: remains on IV antibiotics pending further clinical improvement   Planned Discharge Destination: Home with Home Health    Time spent: 40 minutes  Author: Ezekiel Slocumb, DO 11/26/2021 2:44 PM  For on call review [email or web address].

## 2021-11-27 DIAGNOSIS — J181 Lobar pneumonia, unspecified organism: Secondary | ICD-10-CM | POA: Diagnosis not present

## 2021-11-27 DIAGNOSIS — E440 Moderate protein-calorie malnutrition: Secondary | ICD-10-CM | POA: Diagnosis present

## 2021-11-27 LAB — COMPREHENSIVE METABOLIC PANEL
ALT: 20 U/L (ref 0–44)
AST: 24 U/L (ref 15–41)
Albumin: 2.4 g/dL — ABNORMAL LOW (ref 3.5–5.0)
Alkaline Phosphatase: 72 U/L (ref 38–126)
Anion gap: 9 (ref 5–15)
BUN: 10 mg/dL (ref 8–23)
CO2: 24 mmol/L (ref 22–32)
Calcium: 8.8 mg/dL — ABNORMAL LOW (ref 8.9–10.3)
Chloride: 108 mmol/L (ref 98–111)
Creatinine, Ser: 0.81 mg/dL (ref 0.61–1.24)
GFR, Estimated: >60 mL/min (ref >60)
Glucose, Bld: 89 mg/dL (ref 70–99)
Potassium: 3 mmol/L — ABNORMAL LOW (ref 3.5–5.1)
Sodium: 141 mmol/L (ref 135–145)
Total Bilirubin: 0.7 mg/dL (ref 0.3–1.2)
Total Protein: 6.3 g/dL — ABNORMAL LOW (ref 6.5–8.1)

## 2021-11-27 LAB — CBC WITH DIFFERENTIAL/PLATELET
Abs Immature Granulocytes: 0.05 10*3/uL (ref 0.00–0.07)
Basophils Absolute: 0.1 10*3/uL (ref 0.0–0.1)
Basophils Relative: 1 %
Eosinophils Absolute: 0.7 10*3/uL — ABNORMAL HIGH (ref 0.0–0.5)
Eosinophils Relative: 8 %
HCT: 28.6 % — ABNORMAL LOW (ref 39.0–52.0)
Hemoglobin: 9.2 g/dL — ABNORMAL LOW (ref 13.0–17.0)
Immature Granulocytes: 1 %
Lymphocytes Relative: 24 %
Lymphs Abs: 2.1 10*3/uL (ref 0.7–4.0)
MCH: 29.1 pg (ref 26.0–34.0)
MCHC: 32.2 g/dL (ref 30.0–36.0)
MCV: 90.5 fL (ref 80.0–100.0)
Monocytes Absolute: 0.6 10*3/uL (ref 0.1–1.0)
Monocytes Relative: 7 %
Neutro Abs: 5.4 10*3/uL (ref 1.7–7.7)
Neutrophils Relative %: 59 %
Platelets: 228 10*3/uL (ref 150–400)
RBC: 3.16 MIL/uL — ABNORMAL LOW (ref 4.22–5.81)
RDW: 16 % — ABNORMAL HIGH (ref 11.5–15.5)
WBC: 8.9 10*3/uL (ref 4.0–10.5)
nRBC: 0 % (ref 0.0–0.2)

## 2021-11-27 LAB — CORTISOL: Cortisol, Plasma: 10.3 ug/dL

## 2021-11-27 MED ORDER — ADULT MULTIVITAMIN W/MINERALS CH: 1.0000 | ORAL_TABLET | Freq: Every day | ORAL | Status: DC

## 2021-11-27 MED ORDER — ACETAMINOPHEN 325 MG PO TABS: 650.0000 mg | ORAL_TABLET | Freq: Four times a day (QID) | ORAL | Status: DC | PRN

## 2021-11-27 MED ORDER — POTASSIUM CHLORIDE CRYS ER 20 MEQ PO TBCR
40.0000 meq | EXTENDED_RELEASE_TABLET | ORAL | Status: DC
  Administered 2021-11-27: 40 meq via ORAL
  Filled 2021-11-27: qty 2

## 2021-11-27 MED ORDER — AMOXICILLIN-POT CLAVULANATE 875-125 MG PO TABS: 1.0000 | ORAL_TABLET | Freq: Two times a day (BID) | ORAL | 0 refills | Status: AC

## 2021-11-27 MED ORDER — ENSURE ENLIVE PO LIQD: 237.0000 mL | Freq: Four times a day (QID) | ORAL | 12 refills | Status: DC

## 2021-11-27 MED ORDER — POTASSIUM CHLORIDE 20 MEQ PO PACK: 40.0000 meq | PACK | ORAL | Status: DC

## 2021-11-27 NOTE — Assessment & Plan Note (Signed)
Moderate Malnutrition related to chronic illness (metastatic stage IV lung cancer) as evidenced by percent weight loss, mild fat depletion, mild muscle depletion, moderate muscle depletion.  --Appreciate dietitian recommendations --Started on Ensures, Magic cups, MVI --Regular diet as tolerated

## 2021-11-27 NOTE — Progress Notes (Signed)
Physical Therapy Treatment Patient Details Name: Maurice Simpson MRN: [id number] DOB: [birth year] Today's Date: 11/27/2021   History of Present Illness 66 y.o. 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    PT Comments    Pt seen for PT tx with co-tx with OT 2/2 unsure of pt's willingness/ability to participate in 2 separate sessions. On this date, pt is able to complete bed mobility with supervision, STS with supervision, & ambulate into hallway x 2 times with RW & CGA<>Supervision. Pt endorses BLE give out with prolonged gait but no evidence noted during session. PT educated pt & wife on importance of OOB mobility, discussed f/u HHPT, as well as recommendation to use RW vs QC upon return home. Pt is making steady progress in regards to functional mobility & would benefit from ongoing acute PT services to address endurance, balance, gait & stairs.    Recommendations for follow up therapy are one component of a multi-disciplinary discharge planning process, led by the attending physician.  Recommendations may be updated based on patient status, additional functional criteria and insurance authorization.  Follow Up Recommendations  Home health PT     Assistance Recommended at Discharge Frequent or constant Supervision/Assistance  Patient can return home with the following A little help with walking and/or transfers;A little help with bathing/dressing/bathroom;Help with stairs or ramp for entrance;Assist for transportation   Equipment Recommendations  Rolling walker (2 wheels);BSC/3in1    Recommendations for Other Services       Precautions / Restrictions Precautions Precautions: Fall Restrictions Weight Bearing Restrictions: No     Mobility  Bed Mobility   Bed  Mobility: Supine to Sit, Sit to Supine     Supine to sit: Modified independent (Device/Increase time), HOB elevated Sit to supine: Modified independent (Device/Increase time), HOB elevated   General bed mobility comments: HOB slightly elevated    Transfers Overall transfer level: Needs assistance Equipment used: Rolling walker (2 wheels) Transfers: Sit to/from Stand Sit to Stand: Supervision           General transfer comment: min cuing for safe hand placement during STS from EOB    Ambulation/Gait Ambulation/Gait assistance: Min guard, Supervision Gait Distance (Feet): 55 Feet (+ 55 ft) Assistive device: Rolling walker (2 wheels) Gait Pattern/deviations: Decreased step length - right, Decreased step length - left, Decreased stride length Gait velocity: slightly decreased     General Gait Details: Pt reports his legs feel weak/give out with prolonged ambulation but no evidence of overt weakness noted.   Stairs             Wheelchair Mobility    Modified Rankin (Stroke Patients Only)       Balance Overall balance assessment: Needs assistance Sitting-balance support: Feet supported, Bilateral upper extremity supported Sitting balance-Leahy Scale: Good     Standing balance support: Bilateral upper extremity supported, During functional activity Standing balance-Leahy Scale: Fair                              Cognition Arousal/Alertness: Awake/alert Behavior During Therapy: WFL for tasks assessed/performed, Impulsive Overall Cognitive Status:  (unsure)  General Comments: Pt reports current year is 2024, is oriented to location.        Exercises      General Comments General comments (skin integrity, edema, etc.): PT provided pt's wife with gait belt & educated her on use of it. Educated pt & wife of pt's need to sit in recliner at least for all meals. Encouraged pt to stay mobile upon d/c  home.      Pertinent Vitals/Pain Pain Assessment Pain Assessment: No/denies pain    Home Living                          Prior Function            PT Goals (current goals can now be found in the care plan section) Acute Rehab PT Goals Patient Stated Goal: to go home PT Goal Formulation: With patient/family Time For Goal Achievement: 12/10/21 Potential to Achieve Goals: Good Progress towards PT goals: Progressing toward goals    Frequency    Min 2X/week      PT Plan Current plan remains appropriate    Co-evaluation PT/OT/SLP Co-Evaluation/Treatment: Yes Reason for Co-Treatment: Other (comment) (unsure of pt's willingness/ability to participate in 2 session) PT goals addressed during session: Mobility/safety with mobility;Balance;Proper use of DME        AM-PAC PT "6 Clicks" Mobility   Outcome Measure  Help needed turning from your back to your side while in a flat bed without using bedrails?: None Help needed moving from lying on your back to sitting on the side of a flat bed without using bedrails?: None Help needed moving to and from a bed to a chair (including a wheelchair)?: A Little Help needed standing up from a chair using your arms (e.g., wheelchair or bedside chair)?: A Little Help needed to walk in hospital room?: A Little Help needed climbing 3-5 steps with a railing? : A Little 6 Click Score: 20    End of Session Equipment Utilized During Treatment: Gait belt Activity Tolerance: Patient tolerated treatment well Patient left: in bed;with call bell/phone within reach;with bed alarm set;with family/visitor present   PT Visit Diagnosis: Unsteadiness on feet (R26.81);Muscle weakness (generalized) (M62.81)     Time: 8786-7672 PT Time Calculation (min) (ACUTE ONLY): 25 min  Charges:  $Therapeutic Activity: 8-22 mins                     Lavone Nian, PT, DPT 11/27/21, 9:32 AM   Waunita Schooner 11/27/2021, 9:31 AM

## 2021-11-27 NOTE — Progress Notes (Signed)
Occupational Therapy Treatment Patient Details Name: Maurice Simpson MRN: [id number] DOB: 05/24/1955 Today's Date: 11/27/2021   History of present illness 66 y.o. 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   OT comments  Pt received semi-reclined in bed; wife at bedside. Appearing alert; willing to work with OT on functional mobility/RW safety/balance; declines need for any ADLs at this time. T/f with supervision and cues to standing at RW; able to walk in hallway with RW and CGA-SBA with gait belt for safety, no LOB, approx 90 seconds at slow-moderate speed; took seated rest break, then did same walk again. Pt limited by fatigue, but much improved function from yesterday. See flowsheet below for further details of session and education provided. Left semi-reclined with all needs in reach.  Patient will benefit from continued OT while in acute care.    Recommendations for follow up therapy are one component of a multi-disciplinary discharge planning process, led by the attending physician.  Recommendations may be updated based on patient status, additional functional criteria and insurance authorization.    Follow Up Recommendations  Home health OT     Assistance Recommended at Discharge Frequent or constant Supervision/Assistance  Patient can return home with the following  A little help with walking and/or transfers;A lot of help with bathing/dressing/bathroom;Assistance with cooking/housework;Assist for transportation;Help with stairs or ramp for entrance   Equipment Recommendations  BSC/3in1;Wheelchair (measurements OT);Wheelchair cushion (measurements OT);Other (comment)    Recommendations for Other Services      Precautions / Restrictions Precautions Precautions:  Fall Restrictions Weight Bearing Restrictions: No       Mobility Bed Mobility   Bed Mobility: Supine to Sit, Sit to Supine     Supine to sit: Modified independent (Device/Increase time), HOB elevated Sit to supine: Modified independent (Device/Increase time), HOB elevated   General bed mobility comments: HOB slightly elevated    Transfers Overall transfer level: Needs assistance Equipment used: Rolling walker (2 wheels) Transfers: Sit to/from Stand Sit to Stand: Supervision           General transfer comment: Cues for hand placement during transfer (pt reaching for RW)     Balance Overall balance assessment: Needs assistance Sitting-balance support: Feet supported, Bilateral upper extremity supported Sitting balance-Leahy Scale: Good                                     ADL either performed or assessed with clinical judgement   ADL                                         General ADL Comments: Pt declined ADLs today; states he was getting up to bathroom with wife assist earlier today; Requires assist of wife for LB dressing at home and pt/wife are satisfied with that arrangement.    Extremity/Trunk Assessment Upper Extremity Assessment Upper Extremity Assessment: Overall WFL for tasks assessed            Vision       Perception     Praxis      Cognition Arousal/Alertness: Awake/alert Behavior During Therapy: WFL for tasks assessed/performed, Impulsive Overall  Cognitive Status: Within Functional Limits for tasks assessed                                 General Comments: Pt reports current year is 2024, is oriented to location. Thinks that the [facility] RW is his RW (which is gray at home); had to be corrected several times by wife before he would believe that the RW is not his.        Exercises      Shoulder Instructions       General Comments Education provided to pt and wife on the role of  [place]; pt/wife declines HHOT services at this time. Education provided on home safety and encouraging pt to use shower bench that he has at home.    Pertinent Vitals/ Pain       Pain Assessment Pain Assessment: No/denies pain  Home Living                                          Prior Functioning/Environment              Frequency  Min 2X/week        Progress Toward Goals  OT Goals(current goals can now be found in the care plan section)  Progress towards OT goals: Progressing toward goals  Acute Rehab OT Goals Patient Stated Goal: go home today OT Goal Formulation: With patient/family Time For Goal Achievement: 12/10/21 Potential to Achieve Goals: Good ADL Goals Pt Will Perform Upper Body Dressing: sitting;with set-up;with supervision Pt Will Perform Lower Body Dressing: sit to/from stand;with caregiver independent in assisting Pt Will Transfer to Toilet: with min guard assist;ambulating Pt Will Perform Toileting - Clothing Manipulation and hygiene: with supervision;sitting/lateral leans Additional ADL Goal #1: Pt will complete ADL transfers with LRAD and CGA from caregiver.  Plan Discharge plan remains appropriate    Co-evaluation    PT/OT/SLP Co-Evaluation/Treatment: Yes Reason for Co-Treatment: For patient/therapist safety PT goals addressed during session: Mobility/safety with mobility;Balance;Proper use of DME OT goals addressed during session: Strengthening/ROM;ADL's and self-care;Other (comment) (Activity tolerance; education on LB dressing)      AM-PAC OT "6 Clicks" Daily Activity     Outcome Measure   Help from another person eating meals?: None Help from another person taking care of personal grooming?: A Little Help from another person toileting, which includes using toliet, bedpan, or urinal?: A Little Help from another person bathing (including washing, rinsing, drying)?: A Lot Help from another person to put on and taking off  regular upper body clothing?: None Help from another person to put on and taking off regular lower body clothing?: A Lot 6 Click Score: 18    End of Session Equipment Utilized During Treatment: Gait belt;Rolling walker (2 wheels)  OT Visit Diagnosis: Other abnormalities of gait and mobility (R26.89);Muscle weakness (generalized) (M62.81);Pain   Activity Tolerance Patient tolerated treatment well   Patient Left in bed;with call bell/phone within reach;with bed alarm set;with family/visitor present;with nursing/sitter in room   Nurse Communication Mobility status        Time: 9622-2979 OT Time Calculation (min): 24 min  Charges: OT General Charges $OT Visit: 1 Visit OT Treatments $Therapeutic Activity: 8-22 mins  Maurice Amato, MS, OTR/L   Maurice Simpson 11/27/2021, 9:39 AM

## 2021-11-27 NOTE — Care Management Important Message (Signed)
Important Message  Patient Details  Name: TARVARES LANT MRN: [id number] Date of Birth: 1955-01-17   Medicare Important Message Given:  N/A - LOS <3 / Initial given by admissions     Juliann Pulse A Rosamae Rocque 11/27/2021, 10:13 AM

## 2021-11-27 NOTE — Assessment & Plan Note (Addendum)
Pt has required K supplementation as outpatient, labs followed closely at cancer center. K 3.0 this AM (11/17), down from 3.5. --Replace with oral K-Cl 40 mEq x 2  --Follow BMP --Pt has bottle of potassium at home, will continue as instructed by outpatient prescriber. --Repeat labs within 1 week recommended

## 2021-11-27 NOTE — Plan of Care (Signed)

## 2021-11-27 NOTE — Discharge Summary (Signed)
Physician Discharge Summary   Patient: Maurice Simpson MRN: [id number] DOB: 06/04/1955  Admit date:     11/25/2021  Discharge date: 11/27/21  Discharge Physician: Ezekiel Slocumb   PCP: [name], NP   Recommendations at discharge:   Follow up at [facility] as scheduled Repeat BMP, CBC, Mg, Phos in 1-2 weeks  Follow up with Primary Care in 1 week Follow up with ENT  Discharge Diagnoses: Principal Problem:   Lobar pneumonia ([place]) Active Problems:   Small cell lung cancer ([place])   COPD (chronic obstructive pulmonary disease) ([place])   Hypokalemia due to inadequate potassium intake   Weakness generalized   Generalized weakness   Malnutrition of moderate degree  Resolved Problems:   * No resolved hospital problems. *  Hospital Course: TAMIKA NOU is a 66 y.o. 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  They also reported poor tolerance for PO intake recently due to abnormal taste of foods related to chemotherapy and radiation treatments.    He was found to have post-obstructive pneumonia involving the right middle and lower lobes due to enlarging hilar mass.  Started on IV Zosyn and admitted to medicine service. Oncology and palliative care consulted.   11/17 -- pt notes significant clinical improvement since starting antibiotics.  He's been able to ambulate to restroom twice this AM. Wife agrees he's closer to his recent baseline strength.  Medically stable for discharge home on oral antibiotics. He is to closely follow up in cancer center.   Assessment and Plan: * Lobar pneumonia ([place]) Patient noted to have postobstructive pneumonia and enlarging right hilar mass obstructing the right middle and  right lower lobe bronchus.  Also with worsening leukocytosis on admission. --Continue IV Zosyn --Supportive care per orders with mucolytics, pulmonary hygiene etc --Oncology and palliative consulted  Small cell lung cancer ([place]) Patient has stage IV small cell lung cancer with metastatic disease to the brain, to the adrenal, to the pancreas and omentum. Currently receiving chemotherapy and immunotherapy Consulted oncology and palliative care  COPD (chronic obstructive pulmonary disease) ([place]) Not acutely exacerbated Continue as needed bronchodilator therapy and inhaled steroids  Malnutrition of moderate degree Moderate Malnutrition related to chronic illness (metastatic stage IV lung cancer) as evidenced by percent weight loss, mild fat depletion, mild muscle depletion, moderate muscle depletion.  --Appreciate dietitian recommendations --Started on Ensures, Magic cups, MVI --Regular diet as tolerated  Generalized weakness Due to metastatic disease, poor PO intake, now with acute illness / infection. --PT/OT recommend HH --Wheelchair ordered per wife's request --TOC consulted --Fall precautions  Weakness generalized Most likely related to underlying metastatic cancer  Palliative care consult  Hypokalemia due to inadequate potassium intake Pt has required K supplementation as outpatient, labs followed closely at cancer center. K 3.0 this AM (11/17), down from 3.5. --Replace with oral K-Cl 40 mEq x 2  --Follow BMP --Pt has bottle of potassium at home, will continue as instructed by outpatient prescriber. --Repeat labs within 1 week recommended         Consultants: Oncology, palliative care Procedures performed: None  Disposition: Home health Diet recommendation:  Regular diet DISCHARGE MEDICATION: Allergies as of 11/27/2021       Reactions   Bee Venom Anaphylaxis  Medication List     STOP taking these medications    dexamethasone 4 MG  tablet Commonly known as: DECADRON   Multi Ginseng 1000 MG Caps   potassium chloride 20 MEQ/15ML (10%) Soln   rosuvastatin 10 MG tablet Commonly known as: CRESTOR   valACYclovir 1000 MG tablet Commonly known as: VALTREX       TAKE these medications    acetaminophen 325 MG tablet Commonly known as: TYLENOL Take 2 tablets (650 mg total) by mouth every 6 (six) hours as needed for mild pain, moderate pain, fever or headache.   albuterol (2.5 MG/3ML) 0.083% nebulizer solution Commonly known as: PROVENTIL Take 3 mLs (2.5 mg total) by nebulization every 6 (six) hours as needed for wheezing or shortness of breath.   amoxicillin-clavulanate 875-125 MG tablet Commonly known as: AUGMENTIN Take 1 tablet by mouth 2 (two) times daily for 5 days.   budesonide 0.25 MG/2ML nebulizer solution Commonly known as: Pulmicort Take 2 mLs (0.25 mg total) by nebulization 2 (two) times daily as needed.   COLACE PO Take 1 capsule by mouth as needed (constipation).   dronabinol 5 MG capsule Commonly known as: MARINOL Take 1 capsule (5 mg total) by mouth 2 (two) times daily before lunch and supper.   feeding supplement Liqd Take 237 mLs by mouth 4 (four) times daily.   fluconazole 100 MG tablet Commonly known as: DIFLUCAN Take 1 tablet (100 mg total) by mouth daily. Day 1- take 2 diflucan tablet and rest of the time take 1 daily for thrush   gentamicin 0.3 % ophthalmic solution Commonly known as: GARAMYCIN Place 2 drops into both eyes every 4 (four) hours.   lidocaine-prilocaine cream Commonly known as: EMLA Apply 1 Application topically as needed. Apply to [facility] a cath prior to access   loperamide 2 MG capsule Commonly known as: IMODIUM Take 2 mg by mouth as needed for diarrhea or loose stools.   loratadine 10 MG tablet Commonly known as: CLARITIN Take 10 mg by mouth daily.   mirtazapine 15 MG tablet Commonly known as: Remeron Take 1 tablet (15 mg total) by mouth at bedtime.    multivitamin with minerals Tabs tablet Take 1 tablet by mouth daily.   nystatin 100000 UNIT/ML suspension Commonly known as: MYCOSTATIN Take 5 mLs (500,000 Units total) by mouth 3 (three) times daily.   ofloxacin 0.3 % ophthalmic solution Commonly known as: OCUFLOX Place 5 drops into the right ear 2 (two) times daily.   OLANZapine 10 MG tablet Commonly known as: ZYPREXA TAKE 1 TABLET BY MOUTH EVERYDAY AT BEDTIME   ondansetron 4 MG tablet Commonly known as: Zofran Take 1 tablet (4 mg total) by mouth every 8 (eight) hours as needed for nausea or vomiting.   oxyCODONE 5 MG immediate release tablet Commonly known as: Oxy IR/ROXICODONE Take 1 tablet (5 mg total) by mouth every 4 (four) hours as needed for severe pain.   pantoprazole 20 MG tablet Commonly known as: PROTONIX TAKE 1 TABLET BY MOUTH EVERY DAY   prochlorperazine 10 MG tablet Commonly known as: COMPAZINE Take 1 tablet (10 mg total) by mouth every 6 (six) hours as needed for nausea or vomiting.               Durable Medical Equipment  (From admission, onward)           Start     Ordered   11/27/21 1313  For home use only DME 3 n 1  Once  11/27/21 1312   11/26/21 1059  For home use only DME standard manual wheelchair with seat cushion  Once       Comments: Patient suffers from metastatic small cell lung cancer and generalized weakness which impairs their ability to perform daily activities like bathing, dressing, feeding, grooming, and toileting in the home.  A cane, crutch, or walker will not resolve issue with performing activities of daily living. A wheelchair will allow patient to safely perform daily activities. Patient can safely propel the wheelchair in the home or has a caregiver who can provide assistance. Length of need Lifetime. Accessories: elevating leg rests (ELRs), wheel locks, extensions and anti-tippers.   11/26/21 1059            Discharge Exam: Filed Weights   11/25/21 1354  11/25/21 2048  Weight: 75 kg 75.7 kg   General exam: awake, alert, no acute distress, chronically ill appearing HEENT: atraumatic, clear conjunctiva, anicteric sclera, moist mucus membranes, hearing grossly normal  Respiratory system: diminished breath sounds throughout, no wheezes, rales or rhonchi, normal respiratory effort. Cardiovascular system: normal S1/S2, RRR, no JVD, murmurs, rubs, gallops, no pedal edema.   Gastrointestinal system: soft, NT, ND, no HSM felt, +bowel sounds. Central nervous system: A&O x4. no gross focal neurologic deficits, normal speech Extremities: moves al, no edema, normal tone Skin: dry, intact, normal temperature, normal color, No rashes, lesions or ulcers Psychiatry: normal mood, congruent affect, judgement and insight appear normal   Condition at discharge: stable  The results of significant diagnostics from this hospitalization (including imaging, microbiology, ancillary and laboratory) are listed below for reference.   Imaging Studies: CT CHEST WO CONTRAST  Result Date: 11/25/2021 CLINICAL DATA:  Metastatic lung cancer, weakness, cough EXAM: CT CHEST WITHOUT CONTRAST TECHNIQUE: Multidetector CT imaging of the chest was performed following the standard protocol without IV contrast. RADIATION DOSE REDUCTION: This exam was performed according to the departmental dose-optimization program which includes automated exposure control, adjustment of the mA and/or kV according to patient size and/or use of iterative reconstruction technique. COMPARISON:  09/18/2021 FINDINGS: Cardiovascular: Unenhanced imaging of the heart is unremarkable without pericardial effusion. Atherosclerosis of the coronary vasculature. Normal caliber of the thoracic aorta. Atherosclerosis of the aortic arch and at the origin of the great vessels. Right chest wall port via internal jugular approach, tip at the atriocaval junction. Mediastinum/Nodes: Thyroid, trachea, and esophagus are  unremarkable. The right hilar mass seen on previous exam is more difficult to assess without IV contrast. A soft tissue mass obstructing the right middle and right lower lobe bronchi measures 3.1 x 3.3 cm reference image 95/4, with similar soft tissue mass on previous exam measuring 2.1 x 2.1 cm. Lungs/Pleura: Obstruction of the right middle and right lower lobe by the right hilar mass as described above, with extensive postobstructive changes and volume loss. Rounded peripheral area of consolidation within the right lower lobe measures up to 6.4 x 4.7 cm. Differential would include postobstructive pneumonia or metastatic disease. Upper lobe predominant emphysema again noted. The right apical consolidation seen on prior CT has resolved in the interim. No effusion or pneumothorax. 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 No other acute upper abdominal process. Musculoskeletal: There are no acute or destructive bony lesions. Reconstructed images demonstrate no additional findings. IMPRESSION: 1. Enlarging right hilar mass, consistent with progression of malignancy. Exact measurements are difficult without IV contrast, but there is clear enlargement since prior study. 2. Obstruction of the right middle and right lower  lobe bronchi by the enlarging right hilar mass, with extensive postobstructive changes in the right middle and right lower lobes. Dense peripheral right lower lobe consolidation favors postobstructive pneumonia over metastatic disease. 3. Incomplete visualization of the known adrenal metastases. 4. Aortic Atherosclerosis (ICD10-I70.0) and Emphysema (ICD10-J43.9). Electronically Signed   By: Randa Ngo M.D.   On: 11/25/2021 17:58   CT Head Wo Contrast  Result Date: 11/25/2021 CLINICAL DATA:  Increasing weakness for 1 day, stage IV lung cancer EXAM: CT HEAD WITHOUT CONTRAST TECHNIQUE: Contiguous axial images were obtained from the base of the skull through  the vertex without intravenous contrast. RADIATION DOSE REDUCTION: This exam was performed according to the departmental dose-optimization program which includes automated exposure control, adjustment of the mA and/or kV according to patient size and/or use of iterative reconstruction technique. COMPARISON:  11/02/2021 FINDINGS: Brain: Stable 18 mm left frontal lobe mass with associated vasogenic edema, consistent with known intracranial metastatic disease. Linear calcification within the right occipital lobe consistent with residual of prior metastatic lesion at that location, also unchanged since recent MRI. No acute infarct or hemorrhage. The lateral ventricles and midline structures are stable. No acute extra-axial fluid collections. No midline shift. Vascular: No hyperdense vessel or unexpected calcification. Skull: Normal. Negative for fracture or focal lesion. Sinuses/Orbits: Chronic right mastoid effusion. Paranasal sinuses are otherwise clear. Other: None. IMPRESSION: 1. Stable left frontal lobe lesion with adjacent vasogenic edema, as well as linear cortical calcification in the right occipital lobe, consistent with known intracranial metastases. No change since recent MRI. 2. No acute infarct or hemorrhage. 3. Chronic right mastoid effusion. Electronically Signed   By: Randa Ngo M.D.   On: 11/25/2021 16:01   DG Chest 2 View  Result Date: 11/25/2021 CLINICAL DATA:  Generalized weakness EXAM: CHEST - 2 VIEW COMPARISON:  Chest radiograph done on 08/12/2021, CT chest done on 09/18/2021 FINDINGS: Cardiac size is within normal limits. There are no signs of pulmonary edema. Increased interstitial markings are seen in right lower lung field with interval progression. Rest of the lung fields are clear. Tip of right IJ chest port is seen at the junction of superior vena cava and right atrium. There is no pleural effusion or pneumothorax. Degenerative changes are noted in right AC joint. There is decreased  distance between acromion and proximal humerus suggesting chronic tear of rotator cuff. IMPRESSION: Increased interstitial markings are seen in right lower lung fields suggesting scarring or interstitial pneumonia. Electronically Signed   By: Elmer Picker M.D.   On: 11/25/2021 15:31   MR Brain W Wo Contrast  Result Date: 11/03/2021 CLINICAL DATA:  Metastatic lung cancer. Assess response to treatment EXAM: MRI HEAD WITHOUT AND WITH CONTRAST TECHNIQUE: Multiplanar, multiecho pulse sequences of the brain and surrounding structures were obtained without and with intravenous contrast. CONTRAST:  79mL GADAVIST GADOBUTROL 1 MMOL/ML IV SOLN COMPARISON:  MRI head with contrast 07/02/2021 FINDINGS: Brain: Marked improvement in widespread metastatic disease since the prior study 17 mm lesion left frontal lobe is significantly smaller. There are blood products and methemoglobin in the lesion without significant enhancement. 4 x 12 mm lesion in the right occipital parietal lobe significantly smaller. This is now hyperintense on T1 compatible with methemoglobin. No significant enhancement. Numerous additional enhancing lesions throughout the brain have resolved. No new lesion. Ventricle size normal. No midline shift. Negative for acute infarct Vascular: Normal arterial flow voids Skull and upper cervical spine: No focal skeletal lesion. Sinuses/Orbits: Mild mucosal edema paranasal sinuses. Bilateral mastoid effusion  right greater than left. Cataract extraction on the right Other: None IMPRESSION: Marked improvement in widespread metastatic disease since the prior MRI of 07/02/2021. No new lesion. Electronically Signed   By: Franchot Gallo M.D.   On: 11/03/2021 12:01    Microbiology: Results for orders placed or performed during the hospital encounter of 11/25/21  Resp Panel by RT-PCR (Flu A&B, Covid) Anterior Nasal Swab     Status: None   Collection Time: 11/25/21  4:06 PM   Specimen: Anterior Nasal Swab  Result  Value Ref Range Status   SARS Coronavirus 2 by RT PCR NEGATIVE NEGATIVE Final    Comment: (NOTE) SARS-CoV-2 target nucleic acids are NOT DETECTED.  The SARS-CoV-2 RNA is generally detectable in upper respiratory specimens during the acute phase of infection. The lowest concentration of SARS-CoV-2 viral copies this assay can detect is 138 copies/mL. A negative result does not preclude SARS-Cov-2 infection and should not be used as the sole basis for treatment or other patient management decisions. A negative result may occur with  improper specimen collection/handling, submission of specimen other than nasopharyngeal swab, presence of viral mutation(s) within the areas targeted by this assay, and inadequate number of viral copies(<138 copies/mL). A negative result must be combined with clinical observations, patient history, and epidemiological information. The expected result is Negative.  Fact Sheet for Patients:  EntrepreneurPulse.com.au  Fact Sheet for Healthcare Providers:  IncredibleEmployment.be  This test is no t yet approved or cleared by the [place] FDA and  has been authorized for detection and/or diagnosis of SARS-CoV-2 by FDA under an Emergency Use Authorization (EUA). This EUA will remain  in effect (meaning this test can be used) for the duration of the COVID-19 declaration under Section 564(b)(1) of the Act, 21 U.S.C.section 360bbb-3(b)(1), unless the authorization is terminated  or revoked sooner.       Influenza A by PCR NEGATIVE NEGATIVE Final   Influenza B by PCR NEGATIVE NEGATIVE Final    Comment: (NOTE) The Xpert Xpress SARS-CoV-2/FLU/RSV plus assay is intended as an aid in the diagnosis of influenza from Nasopharyngeal swab specimens and should not be used as a sole basis for treatment. Nasal washings and aspirates are unacceptable for Xpert Xpress SARS-CoV-2/FLU/RSV testing.  Fact Sheet for  Patients: EntrepreneurPulse.com.au  Fact Sheet for Healthcare Providers: IncredibleEmployment.be  This test is not yet approved or cleared by the [place] FDA and has been authorized for detection and/or diagnosis of SARS-CoV-2 by FDA under an Emergency Use Authorization (EUA). This EUA will remain in effect (meaning this test can be used) for the duration of the COVID-19 declaration under Section 564(b)(1) of the Act, 21 U.S.C. section 360bbb-3(b)(1), unless the authorization is terminated or revoked.  Performed at [facility], [address] [place], [place] [address]     Labs: CBC: Recent Labs  Lab 11/25/21 1407 11/26/21 0506 11/27/21 0630  WBC 23.7* 14.4* 8.9  NEUTROABS  --   --  5.4  HGB 10.9* 9.0* 9.2*  HCT 34.0* 28.2* 28.6*  MCV 91.6 91.0 90.5  PLT 272 232 397   Basic Metabolic Panel: Recent Labs  Lab 11/25/21 1407 11/26/21 0506 11/27/21 0630  NA 140 140 141  K 3.6 3.5 3.0*  CL 106 108 108  CO2 23 24 24   GLUCOSE 130* 99 89  BUN 12 12 10   CREATININE 0.94 1.03 0.81  CALCIUM 9.2 8.5* 8.8*  MG 1.8  --   --    Liver Function Tests: Recent Labs  Lab 11/27/21 0630  AST 24  ALT 20  ALKPHOS 72  BILITOT 0.7  PROT 6.3*  ALBUMIN 2.4*   CBG: No results for input(s): "GLUCAP" in the last 168 hours.  Discharge time spent: greater than 30 minutes.  Signed: Ezekiel Slocumb, DO [facility] 11/27/2021

## 2021-11-27 NOTE — TOC Initial Note (Signed)
Transition of Care [facility]) - Initial/Assessment Note    Patient Details  Name: Maurice Simpson MRN: [id number] Date of Birth: 12/07/1955  [facility] of [facility]) CM/SW Contact:    Magnus Ivan, LCSW Phone Number: 11/27/2021, 11:32 AM  Clinical Narrative:                 Patient to DC home today. Spoke with spouse via phone.  Patient lives with his wife who will provide transportation. PCP is [employer] [facility]  Patient has a walker, rollator, cane, and built in shower seat at home. Patient and spouse are agreeable to [facility], wheelchair, and 3in1.  HH referral accepted by Tommi Rumps with [facility] who is aware of DC home today. [employer] referral made for wheelchair and 3in1 to be delivered to bedside prior to DC today.   Expected Discharge Plan: [place] Barriers to Discharge: Barriers Resolved   Patient Goals and CMS Choice Patient states their goals for this hospitalization and ongoing recovery are:: home with home health CMS Medicare.gov Compare Post Acute Care list provided to:: Patient Represenative (must comment) Choice offered to / list presented to : Spouse  Expected Discharge Plan and Services Expected Discharge Plan: [place]       Living arrangements for the past 2 months: Single Family Home Expected Discharge Date: 11/27/21               DME Arranged: Wheelchair manual, 3-N-1 DME Agency: [employer] Date DME Agency Contacted: 11/27/21   Representative spoke with at DME Agency: [place] Arranged: PT, OT [place] Agency: [place] Date [place]: 11/27/21   Representative spoke with at [place]: Tommi Rumps  Prior Living Arrangements/Services Living arrangements for the past 2 months: [place] Lives with:: Spouse Patient language and need for interpreter reviewed:: Yes Do you feel safe going back to the place where you live?: Yes      Need for Family Participation in Patient Care: Yes  (Comment) Care giver support system in place?: Yes (comment) Current home services: DME Criminal Activity/Legal Involvement Pertinent to Current Situation/Hospitalization: No - Comment as needed  Activities of Daily Living Home Assistive Devices/Equipment: Environmental consultant (specify type) (front wheel  walkaer) ADL Screening (condition at time of admission) Patient's cognitive ability adequate to safely complete daily activities?: Yes Is the patient deaf or have difficulty hearing?: No Does the patient have difficulty seeing, even when wearing glasses/contacts?: No Does the patient have difficulty concentrating, remembering, or making decisions?: No Patient able to express need for assistance with ADLs?: No Does the patient have difficulty dressing or bathing?: No Independently performs ADLs?: Yes (appropriate for developmental age) Does the patient have difficulty walking or climbing stairs?: Yes Weakness of Legs: Both Weakness of Arms/Hands: Both  Permission Sought/Granted Permission sought to share information with : Chartered certified accountant granted to share information with : Yes, Verbal Permission Granted     Permission granted to share info w AGENCY: [place] and DME agencies        Emotional Assessment         Alcohol / Substance Use: Not Applicable Psych Involvement: No (comment)  Admission diagnosis:  Lobar pneumonia ([place]) [J18.1] Generalized weakness [R53.1] Pneumonia of right lower lobe due to infectious organism [J18.9] Patient Active Problem List   Diagnosis Date Noted   Malnutrition of moderate degree 11/27/2021   Generalized weakness 11/26/2021   Lobar pneumonia ([place]) 11/25/2021   Weakness generalized  11/25/2021   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   Hypokalemia due to inadequate potassium intake 11/03/2021   Encounter for antineoplastic chemotherapy 08/25/2021   Lung cancer metastatic to brain ([place]) 08/25/2021   Thrush 08/25/2021   Palliative care  encounter    Febrile neutropenia ([place]) 08/12/2021   Thrombocytopenia ([place]) 08/12/2021   Small cell lung cancer ([place]) 07/21/2021   Weight loss, abnormal 06/24/2021   Cardiovascular risk factor 05/12/2021   Bee sting allergy 05/12/2021   Obesity 07/20/2019   Nicotine dependence, cigarettes, uncomplicated 12/87/8676   Family history of colon cancer 07/20/2019   COPD (chronic obstructive pulmonary disease) ([place]) 07/20/2019   Preventative health care 07/20/2019   PCP:  [name], NP Pharmacy[facility]   [id number] [place], [place] [address] [place] [address] Phone: [phone] Fax: [phone]  [id number] [place] [address] [place] [address] Phone: [phone] Fax: [phone]     Social Determinants of Health (SDOH) Interventions    Readmission Risk Interventions    11/27/2021   11:30 AM  Readmission Risk Prevention Plan  Transportation Screening Complete  PCP or Specialist Appt within 3-5 Days Complete  HRI or [place] Complete  Social Work Consult for [place] Planning/Counseling Complete  Palliative Care Screening Not Applicable  Medication Review Press photographer) Complete

## 2021-11-27 NOTE — Progress Notes (Signed)
    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  A cane, crutch, or walker will not resolve issue with performing activities of daily living. A wheelchair will allow patient to safely perform daily activities. Patient can safely propel the wheelchair in the home or has a caregiver who can provide assistance. Length of need Lifetime. Accessories: elevating leg rests (ELRs), wheel locks, extensions and anti-tippers.   11/26/21 1059           Patient is not able to walk the distance required to go the bathroom, or he/she is unable to safely negotiate stairs required to access the bathroom.  A 3in1 BSC will alleviate this problem

## 2021-11-28 LAB — ACTH: C206 ACTH: 32.1 pg/mL (ref 7.2–63.3)

## 2021-11-30 ENCOUNTER — Telehealth: Payer: Self-pay

## 2021-11-30 NOTE — Telephone Encounter (Signed)
Attempted to contact patient's spouse [name] to schedule a Palliative Care consult appointment. No answer and unable to leave a message. Voicemail is full.

## 2021-12-01 ENCOUNTER — Inpatient Hospital Stay (HOSPITAL_BASED_OUTPATIENT_CLINIC_OR_DEPARTMENT_OTHER): Payer: Medicare HMO | Admitting: Hospice and Palliative Medicine

## 2021-12-01 ENCOUNTER — Inpatient Hospital Stay: Payer: Medicare HMO

## 2021-12-01 ENCOUNTER — Encounter: Payer: Self-pay | Admitting: Hospice and Palliative Medicine

## 2021-12-01 VITALS — BP 133/82 | HR 83 | Temp 94.4°F | Resp 20 | Ht 68.0 in | Wt 166.0 lb

## 2021-12-01 VITALS — BP 133/82 | HR 83 | Temp 96.4°F | Resp 20

## 2021-12-01 DIAGNOSIS — R531 Weakness: Secondary | ICD-10-CM | POA: Diagnosis not present

## 2021-12-01 DIAGNOSIS — E860 Dehydration: Secondary | ICD-10-CM

## 2021-12-01 DIAGNOSIS — F17210 Nicotine dependence, cigarettes, uncomplicated: Secondary | ICD-10-CM | POA: Diagnosis not present

## 2021-12-01 DIAGNOSIS — C7971 Secondary malignant neoplasm of right adrenal gland: Secondary | ICD-10-CM | POA: Diagnosis not present

## 2021-12-01 DIAGNOSIS — C7972 Secondary malignant neoplasm of left adrenal gland: Secondary | ICD-10-CM | POA: Diagnosis not present

## 2021-12-01 DIAGNOSIS — Z79899 Other long term (current) drug therapy: Secondary | ICD-10-CM | POA: Diagnosis not present

## 2021-12-01 DIAGNOSIS — C3490 Malignant neoplasm of unspecified part of unspecified bronchus or lung: Secondary | ICD-10-CM

## 2021-12-01 DIAGNOSIS — C7889 Secondary malignant neoplasm of other digestive organs: Secondary | ICD-10-CM | POA: Diagnosis not present

## 2021-12-01 DIAGNOSIS — C7931 Secondary malignant neoplasm of brain: Secondary | ICD-10-CM | POA: Diagnosis not present

## 2021-12-01 DIAGNOSIS — C786 Secondary malignant neoplasm of retroperitoneum and peritoneum: Secondary | ICD-10-CM | POA: Diagnosis not present

## 2021-12-01 DIAGNOSIS — Z5112 Encounter for antineoplastic immunotherapy: Secondary | ICD-10-CM | POA: Diagnosis not present

## 2021-12-01 DIAGNOSIS — C3431 Malignant neoplasm of lower lobe, right bronchus or lung: Secondary | ICD-10-CM | POA: Diagnosis not present

## 2021-12-01 LAB — CBC WITH DIFFERENTIAL/PLATELET
Abs Immature Granulocytes: 0.09 10*3/uL — ABNORMAL HIGH (ref 0.00–0.07)
Basophils Absolute: 0.1 10*3/uL (ref 0.0–0.1)
Basophils Relative: 1 %
Eosinophils Absolute: 0.9 10*3/uL — ABNORMAL HIGH (ref 0.0–0.5)
Eosinophils Relative: 8 %
HCT: 36.3 % — ABNORMAL LOW (ref 39.0–52.0)
Hemoglobin: 11.6 g/dL — ABNORMAL LOW (ref 13.0–17.0)
Immature Granulocytes: 1 %
Lymphocytes Relative: 21 %
Lymphs Abs: 2.3 10*3/uL (ref 0.7–4.0)
MCH: 29.4 pg (ref 26.0–34.0)
MCHC: 32 g/dL (ref 30.0–36.0)
MCV: 92.1 fL (ref 80.0–100.0)
Monocytes Absolute: 0.7 10*3/uL (ref 0.1–1.0)
Monocytes Relative: 6 %
Neutro Abs: 6.8 10*3/uL (ref 1.7–7.7)
Neutrophils Relative %: 63 %
Platelets: 347 10*3/uL (ref 150–400)
RBC: 3.94 MIL/uL — ABNORMAL LOW (ref 4.22–5.81)
RDW: 16.5 % — ABNORMAL HIGH (ref 11.5–15.5)
WBC: 10.9 10*3/uL — ABNORMAL HIGH (ref 4.0–10.5)
nRBC: 0 % (ref 0.0–0.2)

## 2021-12-01 LAB — COMPREHENSIVE METABOLIC PANEL
ALT: 47 U/L — ABNORMAL HIGH (ref 0–44)
AST: 73 U/L — ABNORMAL HIGH (ref 15–41)
Albumin: 3.1 g/dL — ABNORMAL LOW (ref 3.5–5.0)
Alkaline Phosphatase: 86 U/L (ref 38–126)
Anion gap: 9 (ref 5–15)
BUN: 10 mg/dL (ref 8–23)
CO2: 23 mmol/L (ref 22–32)
Calcium: 9.3 mg/dL (ref 8.9–10.3)
Chloride: 106 mmol/L (ref 98–111)
Creatinine, Ser: 0.9 mg/dL (ref 0.61–1.24)
GFR, Estimated: >60 mL/min (ref >60)
Glucose, Bld: 125 mg/dL — ABNORMAL HIGH (ref 70–99)
Potassium: 3.5 mmol/L (ref 3.5–5.1)
Sodium: 138 mmol/L (ref 135–145)
Total Bilirubin: 0.4 mg/dL (ref 0.3–1.2)
Total Protein: 7.6 g/dL (ref 6.5–8.1)

## 2021-12-01 MED ORDER — SODIUM CHLORIDE 0.9 % IV SOLN
INTRAVENOUS | Status: DC
  Filled 2021-12-01: qty 250

## 2021-12-01 MED ORDER — SODIUM CHLORIDE 0.9% FLUSH
10.0000 mL | Freq: Once | INTRAVENOUS | Status: AC | PRN
  Administered 2021-12-01: 10 mL
  Filled 2021-12-01: qty 10

## 2021-12-01 MED ORDER — HEPARIN SOD (PORK) LOCK FLUSH 100 UNIT/ML IV SOLN
500.0000 [IU] | Freq: Once | INTRAVENOUS | Status: AC | PRN
  Administered 2021-12-01: 500 [IU]
  Filled 2021-12-01: qty 5

## 2021-12-01 NOTE — Progress Notes (Signed)
Symptom Management [place] at [facility] Telephone:(336) [phone] [phone]) [phone]  Patient Care Team: [name], NP as PCP - General (Nurse Practitioner) Telford Nab, RN as Oncology Nurse Navigator   NAME OF PATIENT: Maurice Simpson  [id number]  Mar 30, 1955   DATE OF VISIT: 12/01/21  REASON FOR CONSULT: Maurice Simpson is a 66 y.o. male with multiple medical problems including extensive stage small cell lung cancer currently on treatment with carbo-etoposide-atezolizumab.   INTERVAL HISTORY: Patient hospitalized 11/25/2021 to 11/27/2021 with postobstructive pneumonia and CT concerning for possible enlargement of his right hilar mass.  Patient was scheduled today for labs/fluids and was an add-on to [facility] at request of wife due to a reported fall at home.  Patient says that his wife tripped and fell and that he subsequently fell while trying to help her off the floor.  He struck the left side of his face on the linoleum but says that he did not sustain injury.  He currently denies pain in any location.  No changes in cognition.  No loss of consciousness.  No headaches or visual changes.  He denies any focal neurodeficits or weakness.  Denies any neurologic complaints. Denies recent fevers or illnesses. Denies any easy bleeding or bruising. Reports poor appetite . Denies chest pain. Denies any nausea, vomiting, constipation, or diarrhea. Denies urinary complaints. Patient offers no further specific complaints today.   PAST MEDICAL HISTORY: Past Medical History:  Diagnosis Date   Cancer ([place])    COPD (chronic obstructive pulmonary disease) ([place])    Dyspnea     PAST SURGICAL HISTORY:  Past Surgical History:  Procedure Laterality Date   CATARACT EXTRACTION Right 03/10/2021   IR IMAGING GUIDED PORT INSERTION  07/27/2021    HEMATOLOGY/ONCOLOGY HISTORY:  Oncology History  Small cell lung cancer ([place])  07/21/2021 Initial Diagnosis   Small cell lung  cancer ([place])   07/21/2021 Cancer Staging   Staging form: Lung, AJCC 8th Edition - Clinical stage from 07/21/2021: Stage IVB (cT2, cN0, pM1c) - Signed by Sindy Guadeloupe, MD on 07/21/2021   08/03/2021 - 09/01/2021 Chemotherapy   Patient is on Treatment Plan : LUNG SCLC Carboplatin + Etoposide + Atezolizumab Induction q21d / Atezolizumab Maintenance q21d     09/01/2021 -  Chemotherapy   Patient is on Treatment Plan : LUNG SCLC Carboplatin + Etoposide + Atezolizumab Induction q21d x 4 cycles / Atezolizumab Maintenance q21d       ALLERGIES:  is allergic to bee venom.  MEDICATIONS:  Current Outpatient Medications  Medication Sig Dispense Refill   acetaminophen (TYLENOL) 325 MG tablet Take 2 tablets (650 mg total) by mouth every 6 (six) hours as needed for mild pain, moderate pain, fever or headache.     albuterol (PROVENTIL) (2.5 MG/3ML) 0.083% nebulizer solution Take 3 mLs (2.5 mg total) by nebulization every 6 (six) hours as needed for wheezing or shortness of breath. 75 mL 12   amoxicillin-clavulanate (AUGMENTIN) 875-125 MG tablet Take 1 tablet by mouth 2 (two) times daily for 5 days. 10 tablet 0   budesonide (PULMICORT) 0.25 MG/2ML nebulizer solution Take 2 mLs (0.25 mg total) by nebulization 2 (two) times daily as needed. 60 mL 12   Docusate Sodium (COLACE PO) Take 1 capsule by mouth as needed (constipation).     feeding supplement (ENSURE ENLIVE / ENSURE PLUS) LIQD Take 237 mLs by mouth 4 (four) times daily. 237 mL 12   gentamicin (GARAMYCIN) 0.3 % ophthalmic solution Place 2 drops  into both eyes every 4 (four) hours.     lidocaine-prilocaine (EMLA) cream Apply 1 Application topically as needed. Apply to [facility] a cath prior to access     loperamide (IMODIUM) 2 MG capsule Take 2 mg by mouth as needed for diarrhea or loose stools.     loratadine (CLARITIN) 10 MG tablet Take 10 mg by mouth daily.     mirtazapine (REMERON) 15 MG tablet Take 1 tablet (15 mg total) by mouth at bedtime. 30 tablet 2    Multiple Vitamin (MULTIVITAMIN WITH MINERALS) TABS tablet Take 1 tablet by mouth daily.     nystatin (MYCOSTATIN) 100000 UNIT/ML suspension Take 5 mLs (500,000 Units total) by mouth 3 (three) times daily. 180 mL 0   ofloxacin (OCUFLOX) 0.3 % ophthalmic solution Place 5 drops into the right ear 2 (two) times daily.     OLANZapine (ZYPREXA) 10 MG tablet TAKE 1 TABLET BY MOUTH EVERYDAY AT BEDTIME 90 tablet 1   ondansetron (ZOFRAN) 4 MG tablet Take 1 tablet (4 mg total) by mouth every 8 (eight) hours as needed for nausea or vomiting. 60 tablet 3   oxyCODONE (OXY IR/ROXICODONE) 5 MG immediate release tablet Take 1 tablet (5 mg total) by mouth every 4 (four) hours as needed for severe pain. 60 tablet 0   pantoprazole (PROTONIX) 20 MG tablet TAKE 1 TABLET BY MOUTH EVERY DAY 90 tablet 0   potassium chloride SA (KLOR-CON M) 20 MEQ tablet Take 20 mEq by mouth 2 (two) times daily.     prochlorperazine (COMPAZINE) 10 MG tablet Take 1 tablet (10 mg total) by mouth every 6 (six) hours as needed for nausea or vomiting. 60 tablet 2   dronabinol (MARINOL) 5 MG capsule Take 1 capsule (5 mg total) by mouth 2 (two) times daily before lunch and supper. (Patient not taking: Reported on 11/11/2021) 30 capsule 0   No current facility-administered medications for this visit.   Facility-Administered Medications Ordered in Other Visits  Medication Dose Route Frequency Provider Last Rate Last Admin   0.9 %  sodium chloride infusion   Intravenous Weekly Sindy Guadeloupe, MD 999 mL/hr at 12/01/21 1356 New Bag at 12/01/21 1356   heparin lock flush 100 unit/mL  500 Units Intracatheter Once PRN Sindy Guadeloupe, MD        VITAL SIGNS: BP 133/82   Pulse 83   Temp (!) 94.4 F (34.7 C) (Tympanic)   Resp 20   Ht 5\' 8"  (1.727 m)   Wt 166 lb (75.3 kg)   BMI 25.24 kg/m  Filed Weights   12/01/21 1414  Weight: 166 lb (75.3 kg)    Estimated body mass index is 25.24 kg/m as calculated from the following:   Height as of this  encounter: 5\' 8"  (1.727 m).   Weight as of this encounter: 166 lb (75.3 kg).  LABS: CBC:    Component Value Date/Time   WBC 10.9 (H) 12/01/2021 1357   HGB 11.6 (L) 12/01/2021 1357   HGB 18.0 (H) 04/30/2021 1136   HCT 36.3 (L) 12/01/2021 1357   HCT 53.3 (H) 04/30/2021 1136   PLT 347 12/01/2021 1357   PLT 156 04/30/2021 1136   MCV 92.1 12/01/2021 1357   MCV 91 04/30/2021 1136   MCV 94 01/21/2011 0134   NEUTROABS 6.8 12/01/2021 1357   NEUTROABS 4.8 04/30/2021 1136   LYMPHSABS 2.3 12/01/2021 1357   LYMPHSABS 2.9 04/30/2021 1136   MONOABS 0.7 12/01/2021 1357   EOSABS 0.9 (H) 12/01/2021 1357  EOSABS 0.3 04/30/2021 1136   BASOSABS 0.1 12/01/2021 1357   BASOSABS 0.1 04/30/2021 1136   Comprehensive Metabolic Panel:    Component Value Date/Time   NA 141 11/27/2021 0630   NA 136 04/30/2021 1136   NA 139 01/21/2011 0134   K 3.0 (L) 11/27/2021 0630   K 3.8 01/21/2011 0134   CL 108 11/27/2021 0630   CL 105 01/21/2011 0134   CO2 24 11/27/2021 0630   CO2 23 01/21/2011 0134   BUN 10 11/27/2021 0630   BUN 12 04/30/2021 1136   BUN 15 01/21/2011 0134   CREATININE 0.81 11/27/2021 0630   CREATININE 0.92 01/21/2011 0134   GLUCOSE 89 11/27/2021 0630   GLUCOSE 118 (H) 01/21/2011 0134   CALCIUM 8.8 (L) 11/27/2021 0630   CALCIUM 9.1 01/21/2011 0134   AST 24 11/27/2021 0630   AST 20 01/21/2011 0134   ALT 20 11/27/2021 0630   ALT 29 01/21/2011 0134   ALKPHOS 72 11/27/2021 0630   ALKPHOS 46 (L) 01/21/2011 0134   BILITOT 0.7 11/27/2021 0630   BILITOT 0.8 04/30/2021 1136   BILITOT 0.8 01/21/2011 0134   PROT 6.3 (L) 11/27/2021 0630   PROT 7.2 04/30/2021 1136   PROT 7.4 01/21/2011 0134   ALBUMIN 2.4 (L) 11/27/2021 0630   ALBUMIN 4.3 04/30/2021 1136   ALBUMIN 3.8 01/21/2011 0134    RADIOGRAPHIC STUDIES: CT CHEST WO CONTRAST  Result Date: 11/25/2021 CLINICAL DATA:  Metastatic lung cancer, weakness, cough EXAM: CT CHEST WITHOUT CONTRAST TECHNIQUE: Multidetector CT imaging of the  chest was performed following the standard protocol without IV contrast. RADIATION DOSE REDUCTION: This exam was performed according to the departmental dose-optimization program which includes automated exposure control, adjustment of the mA and/or kV according to patient size and/or use of iterative reconstruction technique. COMPARISON:  09/18/2021 FINDINGS: Cardiovascular: Unenhanced imaging of the heart is unremarkable without pericardial effusion. Atherosclerosis of the coronary vasculature. Normal caliber of the thoracic aorta. Atherosclerosis of the aortic arch and at the origin of the great vessels. Right chest wall port via internal jugular approach, tip at the atriocaval junction. Mediastinum/Nodes: Thyroid, trachea, and esophagus are unremarkable. The right hilar mass seen on previous exam is more difficult to assess without IV contrast. A soft tissue mass obstructing the right middle and right lower lobe bronchi measures 3.1 x 3.3 cm reference image 95/4, with similar soft tissue mass on previous exam measuring 2.1 x 2.1 cm. Lungs/Pleura: Obstruction of the right middle and right lower lobe by the right hilar mass as described above, with extensive postobstructive changes and volume loss. Rounded peripheral area of consolidation within the right lower lobe measures up to 6.4 x 4.7 cm. Differential would include postobstructive pneumonia or metastatic disease. Upper lobe predominant emphysema again noted. The right apical consolidation seen on prior CT has resolved in the interim. No effusion or pneumothorax. 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 No other acute upper abdominal process. Musculoskeletal: There are no acute or destructive bony lesions. Reconstructed images demonstrate no additional findings. IMPRESSION: 1. Enlarging right hilar mass, consistent with progression of malignancy. Exact measurements are difficult without IV contrast, but there is clear  enlargement since prior study. 2. Obstruction of the right middle and right lower lobe bronchi by the enlarging right hilar mass, with extensive postobstructive changes in the right middle and right lower lobes. Dense peripheral right lower lobe consolidation favors postobstructive pneumonia over metastatic disease. 3. Incomplete visualization of the known adrenal metastases. 4. Aortic Atherosclerosis (  ICD10-I70.0) and Emphysema (ICD10-J43.9). Electronically Signed   By: Randa Ngo M.D.   On: 11/25/2021 17:58   CT Head Wo Contrast  Result Date: 11/25/2021 CLINICAL DATA:  Increasing weakness for 1 day, stage IV lung cancer EXAM: CT HEAD WITHOUT CONTRAST TECHNIQUE: Contiguous axial images were obtained from the base of the skull through the vertex without intravenous contrast. RADIATION DOSE REDUCTION: This exam was performed according to the departmental dose-optimization program which includes automated exposure control, adjustment of the mA and/or kV according to patient size and/or use of iterative reconstruction technique. COMPARISON:  11/02/2021 FINDINGS: Brain: Stable 18 mm left frontal lobe mass with associated vasogenic edema, consistent with known intracranial metastatic disease. Linear calcification within the right occipital lobe consistent with residual of prior metastatic lesion at that location, also unchanged since recent MRI. No acute infarct or hemorrhage. The lateral ventricles and midline structures are stable. No acute extra-axial fluid collections. No midline shift. Vascular: No hyperdense vessel or unexpected calcification. Skull: Normal. Negative for fracture or focal lesion. Sinuses/Orbits: Chronic right mastoid effusion. Paranasal sinuses are otherwise clear. Other: None. IMPRESSION: 1. Stable left frontal lobe lesion with adjacent vasogenic edema, as well as linear cortical calcification in the right occipital lobe, consistent with known intracranial metastases. No change since  recent MRI. 2. No acute infarct or hemorrhage. 3. Chronic right mastoid effusion. Electronically Signed   By: Randa Ngo M.D.   On: 11/25/2021 16:01   DG Chest 2 View  Result Date: 11/25/2021 CLINICAL DATA:  Generalized weakness EXAM: CHEST - 2 VIEW COMPARISON:  Chest radiograph done on 08/12/2021, CT chest done on 09/18/2021 FINDINGS: Cardiac size is within normal limits. There are no signs of pulmonary edema. Increased interstitial markings are seen in right lower lung field with interval progression. Rest of the lung fields are clear. Tip of right IJ chest port is seen at the junction of superior vena cava and right atrium. There is no pleural effusion or pneumothorax. Degenerative changes are noted in right AC joint. There is decreased distance between acromion and proximal humerus suggesting chronic tear of rotator cuff. IMPRESSION: Increased interstitial markings are seen in right lower lung fields suggesting scarring or interstitial pneumonia. Electronically Signed   By: Elmer Picker M.D.   On: 11/25/2021 15:31   MR Brain W Wo Contrast  Result Date: 11/03/2021 CLINICAL DATA:  Metastatic lung cancer. Assess response to treatment EXAM: MRI HEAD WITHOUT AND WITH CONTRAST TECHNIQUE: Multiplanar, multiecho pulse sequences of the brain and surrounding structures were obtained without and with intravenous contrast. CONTRAST:  25mL GADAVIST GADOBUTROL 1 MMOL/ML IV SOLN COMPARISON:  MRI head with contrast 07/02/2021 FINDINGS: Brain: Marked improvement in widespread metastatic disease since the prior study 17 mm lesion left frontal lobe is significantly smaller. There are blood products and methemoglobin in the lesion without significant enhancement. 4 x 12 mm lesion in the right occipital parietal lobe significantly smaller. This is now hyperintense on T1 compatible with methemoglobin. No significant enhancement. Numerous additional enhancing lesions throughout the brain have resolved. No new  lesion. Ventricle size normal. No midline shift. Negative for acute infarct Vascular: Normal arterial flow voids Skull and upper cervical spine: No focal skeletal lesion. Sinuses/Orbits: Mild mucosal edema paranasal sinuses. Bilateral mastoid effusion right greater than left. Cataract extraction on the right Other: None IMPRESSION: Marked improvement in widespread metastatic disease since the prior MRI of 07/02/2021. No new lesion. Electronically Signed   By: Franchot Gallo M.D.   On: 11/03/2021 12:01  PERFORMANCE STATUS (ECOG) : 2 - Symptomatic, <50% confined to bed  Review of Systems Unless otherwise noted, a complete review of systems is negative.  Physical Exam General: NAD HEENT: thrush on tongue Cardiovascular: regular rate and rhythm Pulmonary: clear ant fields Abdomen: soft, nontender, + bowel sounds GU: no suprapubic tenderness Extremities: no edema, no joint deformities Skin: no rashes Neurological: Generalized weakness but otherwise grossly nonfocal  IMPRESSION/PLAN: Weakness/fall -neuro exam is nonfocal.  No red flags noted on assessment or exam.  Reviewed ED triggers with patient and wife.  Patient has home health currently working with him and wife feels like he is slowly improving in regards to performance status.  Continue supportive care as needed.  RTC next week to see Dr. Janese Banks  Patient expressed understanding and was in agreement with this plan. He also understands that He can call clinic at any time with any questions, concerns, or complaints.   Thank you for allowing me to participate in the care of this very pleasant patient.   Time Total: 15 minutes  Visit consisted of counseling and education dealing with the complex and emotionally intense issues of symptom management in the setting of serious illness.Greater than 50%  of this time was spent counseling and coordinating care related to the above assessment and plan.  Signed by: Altha Harm, PhD,  NP-C

## 2021-12-02 ENCOUNTER — Telehealth: Payer: Self-pay | Admitting: *Deleted

## 2021-12-02 LAB — PARANEOPLASTIC AB
AGNA-1: NEGATIVE
Amphiphysin Antibody: NEGATIVE
Anti-Hu Ab: NEGATIVE
Anti-Ri Ab: NEGATIVE
Anti-Yo Ab: NEGATIVE
Antineruonal nuclear Ab Type 3: NEGATIVE
CASPR2 Antibody,Cell-based IFA: NEGATIVE
CRMP-5 IgG: NEGATIVE
Interpretation: NEGATIVE
LGI1 Antibody, Cell-based IFA: NEGATIVE
Purkinje Cell Cyto Ab Type 2: NEGATIVE
Purkinje Cell Cyto Ab Type Tr: NEGATIVE
VGCC Antibody: <1 pmol/L (ref 0.0–30.0)

## 2021-12-02 NOTE — Patient Outreach (Signed)
  Care Coordination [facility] Note Transition Care Management Unsuccessful Follow-up Telephone Call  Date of discharge and from where:  [facility] [phone]  Attempts:  1st Attempt  Reason for unsuccessful TCM follow-up call:  Unable to leave message Voice mailbox not set up  [place] Management [phone]

## 2021-12-07 ENCOUNTER — Telehealth: Payer: Self-pay

## 2021-12-07 ENCOUNTER — Telehealth: Payer: Self-pay | Admitting: *Deleted

## 2021-12-07 ENCOUNTER — Other Ambulatory Visit: Payer: Self-pay | Admitting: *Deleted

## 2021-12-07 ENCOUNTER — Encounter: Payer: Medicare HMO | Attending: Oncology

## 2021-12-07 ENCOUNTER — Encounter: Admission: RE | Admit: 2021-12-07 | Payer: Medicare HMO | Source: Ambulatory Visit

## 2021-12-07 DIAGNOSIS — C3490 Malignant neoplasm of unspecified part of unspecified bronchus or lung: Secondary | ICD-10-CM

## 2021-12-07 NOTE — Telephone Encounter (Signed)
Spoke with pt's wife who stated that they are having difficulty paying for utility bills and interested in community resources. Referral to social worker has been placed. Informed of $1000 grant to help as well. Pt's wife interested and will provide application for financial assistance.

## 2021-12-07 NOTE — Telephone Encounter (Signed)
Pharmacy requested 90 days supply for remeron. Please consider RF request.

## 2021-12-07 NOTE — Telephone Encounter (Signed)
PC SW outreached patient/spouse to schedule initial Palliative care consult.  Call unsuccessful. SW unable to LVM.

## 2021-12-08 ENCOUNTER — Telehealth: Payer: Self-pay | Admitting: *Deleted

## 2021-12-08 MED ORDER — MIRTAZAPINE 15 MG PO TABS: 15.0000 mg | ORAL_TABLET | Freq: Every day | ORAL | 1 refills | Status: DC

## 2021-12-08 NOTE — Patient Outreach (Signed)
  Care Coordination [facility] Note Transition Care Management Unsuccessful Follow-up Telephone Call  Date of discharge and from where:  [facility] [phone]  Attempts:  2nd Attempt  Reason for unsuccessful TCM follow-up call:  Unable to leave message Voice mailbox not set up  [place] Management [phone]

## 2021-12-08 NOTE — Progress Notes (Signed)
Enrolled patient into the [employer] and [employer].

## 2021-12-09 ENCOUNTER — Inpatient Hospital Stay: Payer: Medicare HMO | Admitting: Oncology

## 2021-12-09 ENCOUNTER — Telehealth: Payer: Self-pay | Admitting: *Deleted

## 2021-12-09 ENCOUNTER — Telehealth: Payer: Self-pay

## 2021-12-09 ENCOUNTER — Inpatient Hospital Stay: Payer: Medicare HMO

## 2021-12-09 ENCOUNTER — Other Ambulatory Visit: Payer: Self-pay | Admitting: Nurse Practitioner

## 2021-12-09 ENCOUNTER — Encounter: Payer: Self-pay | Admitting: Licensed Clinical Social Worker

## 2021-12-09 DIAGNOSIS — C3490 Malignant neoplasm of unspecified part of unspecified bronchus or lung: Secondary | ICD-10-CM

## 2021-12-09 NOTE — Patient Outreach (Signed)
  Care Coordination [facility] Note Transition Care Management Unsuccessful Follow-up Telephone Call  Date of discharge and from where:  [facility] [phone]  Attempts:  3rd Attempt  Reason for unsuccessful TCM follow-up call:  Missing or invalid number  [place] Management [phone]

## 2021-12-09 NOTE — Progress Notes (Addendum)
[place] Work  Clinical Social Work was referred by medical provider for assessment of psychosocial needs.  Clinical Social Worker attempted to contact patient by phone and caregiver to offer support and assess for needs.  Csw unable to leave a voicemail in either available contact number.  The preferred number does not have voicemail set-up, and the secondary number is incorrect in the system.    FA  Adelene Amas, LCSW  Clinical Social Worker [facility]

## 2021-12-09 NOTE — Telephone Encounter (Signed)
PC SW outreached patient/spouse x2 to schedule initial Palliative care consult.   Call unsuccessful. SW unable to LVM.  Referral will be closed.

## 2021-12-15 ENCOUNTER — Inpatient Hospital Stay: Payer: Medicare HMO | Attending: Oncology

## 2021-12-15 ENCOUNTER — Inpatient Hospital Stay: Payer: Medicare HMO

## 2021-12-15 ENCOUNTER — Other Ambulatory Visit: Payer: Self-pay | Admitting: *Deleted

## 2021-12-15 VITALS — BP 109/74 | HR 84 | Temp 96.9°F | Resp 20

## 2021-12-15 DIAGNOSIS — C7931 Secondary malignant neoplasm of brain: Secondary | ICD-10-CM | POA: Diagnosis not present

## 2021-12-15 DIAGNOSIS — C3490 Malignant neoplasm of unspecified part of unspecified bronchus or lung: Secondary | ICD-10-CM | POA: Diagnosis not present

## 2021-12-15 DIAGNOSIS — C7989 Secondary malignant neoplasm of other specified sites: Secondary | ICD-10-CM | POA: Insufficient documentation

## 2021-12-15 DIAGNOSIS — E876 Hypokalemia: Secondary | ICD-10-CM | POA: Insufficient documentation

## 2021-12-15 LAB — CBC WITH DIFFERENTIAL/PLATELET
Abs Immature Granulocytes: 0.06 10*3/uL (ref 0.00–0.07)
Basophils Absolute: 0.1 10*3/uL (ref 0.0–0.1)
Basophils Relative: 1 %
Eosinophils Absolute: 0.4 10*3/uL (ref 0.0–0.5)
Eosinophils Relative: 4 %
HCT: 39.6 % (ref 39.0–52.0)
Hemoglobin: 13 g/dL (ref 13.0–17.0)
Immature Granulocytes: 1 %
Lymphocytes Relative: 25 %
Lymphs Abs: 2.8 10*3/uL (ref 0.7–4.0)
MCH: 29.7 pg (ref 26.0–34.0)
MCHC: 32.8 g/dL (ref 30.0–36.0)
MCV: 90.4 fL (ref 80.0–100.0)
Monocytes Absolute: 0.8 10*3/uL (ref 0.1–1.0)
Monocytes Relative: 7 %
Neutro Abs: 7.1 10*3/uL (ref 1.7–7.7)
Neutrophils Relative %: 62 %
Platelets: 254 10*3/uL (ref 150–400)
RBC: 4.38 MIL/uL (ref 4.22–5.81)
RDW: 16.8 % — ABNORMAL HIGH (ref 11.5–15.5)
WBC: 11.3 10*3/uL — ABNORMAL HIGH (ref 4.0–10.5)
nRBC: 0 % (ref 0.0–0.2)

## 2021-12-15 LAB — COMPREHENSIVE METABOLIC PANEL
ALT: 31 U/L (ref 0–44)
AST: 35 U/L (ref 15–41)
Albumin: 4 g/dL (ref 3.5–5.0)
Alkaline Phosphatase: 67 U/L (ref 38–126)
Anion gap: 12 (ref 5–15)
BUN: 21 mg/dL (ref 8–23)
CO2: 24 mmol/L (ref 22–32)
Calcium: 9.9 mg/dL (ref 8.9–10.3)
Chloride: 100 mmol/L (ref 98–111)
Creatinine, Ser: 0.84 mg/dL (ref 0.61–1.24)
GFR, Estimated: >60 mL/min (ref >60)
Glucose, Bld: 127 mg/dL — ABNORMAL HIGH (ref 70–99)
Potassium: 3.7 mmol/L (ref 3.5–5.1)
Sodium: 136 mmol/L (ref 135–145)
Total Bilirubin: 1.2 mg/dL (ref 0.3–1.2)
Total Protein: 8.1 g/dL (ref 6.5–8.1)

## 2021-12-15 MED ORDER — HEPARIN SOD (PORK) LOCK FLUSH 100 UNIT/ML IV SOLN
500.0000 [IU] | Freq: Once | INTRAVENOUS | Status: AC | PRN
  Administered 2021-12-15: 500 [IU]
  Filled 2021-12-15: qty 5

## 2021-12-15 MED ORDER — SODIUM CHLORIDE 0.9 % IV SOLN
INTRAVENOUS | Status: DC
  Filled 2021-12-15: qty 250

## 2021-12-15 MED ORDER — PROCHLORPERAZINE EDISYLATE 10 MG/2ML IJ SOLN
10.0000 mg | Freq: Once | INTRAMUSCULAR | Status: AC
  Administered 2021-12-15: 10 mg via INTRAVENOUS
  Filled 2021-12-15: qty 2

## 2021-12-15 MED ORDER — OLANZAPINE 10 MG PO TABS: ORAL_TABLET | ORAL | 0 refills | Status: DC

## 2021-12-15 MED ORDER — SODIUM CHLORIDE 0.9% FLUSH
10.0000 mL | Freq: Once | INTRAVENOUS | Status: AC | PRN
  Administered 2021-12-15: 10 mL
  Filled 2021-12-15: qty 10

## 2021-12-22 ENCOUNTER — Encounter: Payer: Self-pay | Admitting: Licensed Clinical Social Worker

## 2021-12-22 DIAGNOSIS — C3490 Malignant neoplasm of unspecified part of unspecified bronchus or lung: Secondary | ICD-10-CM

## 2021-12-22 NOTE — Progress Notes (Signed)
[place] Work    Clinical Social Work was referred by medical provider for assessment of psychosocial needs.  Clinical Social Worker attempted to contact patient by phone and caregiver to offer support and assess for needs.  CSW unable to leave a voicemail in either available contact number.  The preferred number does not have voicemail set-up, and the secondary number is incorrect in the system.   SA  Adelene Amas, LCSW  Clinical Social Worker [id number]

## 2021-12-23 ENCOUNTER — Other Ambulatory Visit: Payer: Self-pay | Admitting: Nurse Practitioner

## 2021-12-24 ENCOUNTER — Other Ambulatory Visit: Payer: Self-pay | Admitting: Oncology

## 2021-12-24 DIAGNOSIS — C3490 Malignant neoplasm of unspecified part of unspecified bronchus or lung: Secondary | ICD-10-CM

## 2021-12-25 ENCOUNTER — Inpatient Hospital Stay: Payer: Medicare HMO

## 2021-12-29 ENCOUNTER — Other Ambulatory Visit: Payer: Self-pay | Admitting: Oncology

## 2021-12-30 ENCOUNTER — Inpatient Hospital Stay: Payer: Medicare HMO | Admitting: Oncology

## 2021-12-30 ENCOUNTER — Inpatient Hospital Stay: Payer: Medicare HMO

## 2021-12-30 VITALS — BP 123/63 | HR 82 | Temp 97.5°F | Resp 20

## 2021-12-30 DIAGNOSIS — E876 Hypokalemia: Secondary | ICD-10-CM | POA: Diagnosis not present

## 2021-12-30 DIAGNOSIS — C3490 Malignant neoplasm of unspecified part of unspecified bronchus or lung: Secondary | ICD-10-CM

## 2021-12-30 DIAGNOSIS — C7931 Secondary malignant neoplasm of brain: Secondary | ICD-10-CM | POA: Diagnosis not present

## 2021-12-30 DIAGNOSIS — C7989 Secondary malignant neoplasm of other specified sites: Secondary | ICD-10-CM | POA: Diagnosis not present

## 2021-12-30 MED ORDER — HEPARIN SOD (PORK) LOCK FLUSH 100 UNIT/ML IV SOLN
500.0000 [IU] | Freq: Once | INTRAVENOUS | Status: AC | PRN
  Administered 2021-12-30: 500 [IU]
  Filled 2021-12-30: qty 5

## 2021-12-30 MED ORDER — SODIUM CHLORIDE 0.9% FLUSH
10.0000 mL | Freq: Once | INTRAVENOUS | Status: AC | PRN
  Administered 2021-12-30: 10 mL
  Filled 2021-12-30: qty 10

## 2021-12-30 MED ORDER — SODIUM CHLORIDE 0.9 % IV SOLN
INTRAVENOUS | Status: DC
  Filled 2021-12-30: qty 250

## 2021-12-31 ENCOUNTER — Encounter
Admission: RE | Admit: 2021-12-31 | Discharge: 2021-12-31 | Disposition: A | Discharge status: Home / Self Care | Payer: Medicare HMO | Source: Ambulatory Visit | Attending: Oncology | Admitting: Oncology

## 2021-12-31 ENCOUNTER — Ambulatory Visit: Payer: Medicare HMO

## 2021-12-31 DIAGNOSIS — C3490 Malignant neoplasm of unspecified part of unspecified bronchus or lung: Secondary | ICD-10-CM | POA: Insufficient documentation

## 2021-12-31 MED ORDER — TECHNETIUM TC 99M MEDRONATE IV KIT
20.0000 | PACK | Freq: Once | INTRAVENOUS | Status: AC | PRN
  Administered 2021-12-31: 21.6 via INTRAVENOUS

## 2022-01-07 ENCOUNTER — Encounter: Payer: Self-pay | Admitting: Licensed Clinical Social Worker

## 2022-01-07 DIAGNOSIS — C3490 Malignant neoplasm of unspecified part of unspecified bronchus or lung: Secondary | ICD-10-CM

## 2022-01-07 NOTE — Progress Notes (Signed)
[place] Work   Clinical Social Work was referred by medical provider for assessment of psychosocial needs.  Clinical Social Worker attempted to contact patient by phone and caregiver to offer support and assess for needs.  CSW unable to leave a voicemail in either available contact number.  The preferred number does not have voicemail set-up, and the secondary number is incorrect in the system.    TA   Adelene Amas, LCSW  Clinical Social Worker [facility]

## 2022-01-08 ENCOUNTER — Telehealth: Payer: Self-pay | Admitting: *Deleted

## 2022-01-08 NOTE — Telephone Encounter (Signed)
Maurice Simpson from [place] called regarding paperwork that was sent.  RN returned call and spoke with Maurice Simpson who stated she has re faxed orders to our main fax number that need signatures and faxed back to her office as soon as possible.  This nurse verbalized understanding and stated I would notify provider and his nurse.

## 2022-01-15 ENCOUNTER — Telehealth: Payer: Self-pay | Admitting: Oncology

## 2022-01-15 ENCOUNTER — Inpatient Hospital Stay: Payer: Medicare HMO

## 2022-01-15 ENCOUNTER — Inpatient Hospital Stay: Payer: Medicare HMO | Admitting: Oncology

## 2022-01-15 NOTE — Telephone Encounter (Signed)
pt spouse called in to have treatment for today moved to 1/10.Marland KitchenAware that this will also adjust future treatments

## 2022-01-15 NOTE — Telephone Encounter (Signed)
Please let patients wife know we cannot keep moving treatments at their beck and call. His last rx was in November. We are holding his spot each time which we could have given it to someone else. This cannot happen again next time. We are accommodating for 1 last time

## 2022-01-18 ENCOUNTER — Telehealth: Payer: Self-pay | Admitting: Oncology

## 2022-01-18 NOTE — Telephone Encounter (Signed)
Attempt made to reach patient's spouse (patient's phone listed no longer in service). Patient needs to be rescheduled for missed treatment. Dr. Janese Banks requested we speak with patient and/or spouse about repeated cancellations.

## 2022-01-19 ENCOUNTER — Telehealth: Payer: Self-pay | Admitting: Oncology

## 2022-01-19 NOTE — Telephone Encounter (Signed)
Attempt made to reach patient's spouse to reschedule missed chemo infusion. Unable to leave a VM (mailbox not set up) and patient's phone is not in service at this time.

## 2022-01-25 ENCOUNTER — Other Ambulatory Visit: Payer: Self-pay | Admitting: Internal Medicine

## 2022-01-25 DIAGNOSIS — C3490 Malignant neoplasm of unspecified part of unspecified bronchus or lung: Secondary | ICD-10-CM

## 2022-01-27 ENCOUNTER — Telehealth: Payer: Self-pay | Admitting: Oncology

## 2022-01-27 NOTE — Telephone Encounter (Signed)
Spoke with patient's spouse on 1/15 and expressed importance of keeping all appointments scheduled. She stated that patient is able to come in on 1/18 for treatment--plan has been updated by covering MD and patient scheduled after verifying supple with pharmacy. VM left with Lucendia Herrlich (on her # as requested) with appointment details.

## 2022-01-28 ENCOUNTER — Inpatient Hospital Stay: Payer: Medicare HMO | Attending: Oncology

## 2022-01-28 ENCOUNTER — Encounter: Payer: Self-pay | Admitting: Nurse Practitioner

## 2022-01-28 ENCOUNTER — Inpatient Hospital Stay (HOSPITAL_BASED_OUTPATIENT_CLINIC_OR_DEPARTMENT_OTHER): Payer: Medicare HMO | Admitting: Nurse Practitioner

## 2022-01-28 ENCOUNTER — Inpatient Hospital Stay: Payer: Medicare HMO

## 2022-01-28 VITALS — BP 120/79 | HR 92 | Temp 98.9°F | Ht 68.0 in | Wt 158.0 lb

## 2022-01-28 DIAGNOSIS — Z79899 Other long term (current) drug therapy: Secondary | ICD-10-CM | POA: Insufficient documentation

## 2022-01-28 DIAGNOSIS — C3490 Malignant neoplasm of unspecified part of unspecified bronchus or lung: Secondary | ICD-10-CM

## 2022-01-28 DIAGNOSIS — C7889 Secondary malignant neoplasm of other digestive organs: Secondary | ICD-10-CM | POA: Insufficient documentation

## 2022-01-28 DIAGNOSIS — R531 Weakness: Secondary | ICD-10-CM | POA: Diagnosis not present

## 2022-01-28 DIAGNOSIS — R64 Cachexia: Secondary | ICD-10-CM

## 2022-01-28 DIAGNOSIS — C3431 Malignant neoplasm of lower lobe, right bronchus or lung: Secondary | ICD-10-CM | POA: Diagnosis not present

## 2022-01-28 DIAGNOSIS — C7931 Secondary malignant neoplasm of brain: Secondary | ICD-10-CM | POA: Diagnosis not present

## 2022-01-28 DIAGNOSIS — Z87891 Personal history of nicotine dependence: Secondary | ICD-10-CM | POA: Insufficient documentation

## 2022-01-28 LAB — COMPREHENSIVE METABOLIC PANEL
ALT: 28 U/L (ref 0–44)
AST: 33 U/L (ref 15–41)
Albumin: 3.2 g/dL — ABNORMAL LOW (ref 3.5–5.0)
Alkaline Phosphatase: 98 U/L (ref 38–126)
Anion gap: 9 (ref 5–15)
BUN: 15 mg/dL (ref 8–23)
CO2: 25 mmol/L (ref 22–32)
Calcium: 8.9 mg/dL (ref 8.9–10.3)
Chloride: 100 mmol/L (ref 98–111)
Creatinine, Ser: 0.75 mg/dL (ref 0.61–1.24)
GFR, Estimated: >60 mL/min (ref >60)
Glucose, Bld: 141 mg/dL — ABNORMAL HIGH (ref 70–99)
Potassium: 3.8 mmol/L (ref 3.5–5.1)
Sodium: 134 mmol/L — ABNORMAL LOW (ref 135–145)
Total Bilirubin: 0.3 mg/dL (ref 0.3–1.2)
Total Protein: 7.9 g/dL (ref 6.5–8.1)

## 2022-01-28 LAB — CBC WITH DIFFERENTIAL/PLATELET
Abs Immature Granulocytes: 0.02 10*3/uL (ref 0.00–0.07)
Basophils Absolute: 0.1 10*3/uL (ref 0.0–0.1)
Basophils Relative: 1 %
Eosinophils Absolute: 0.2 10*3/uL (ref 0.0–0.5)
Eosinophils Relative: 2 %
HCT: 35.1 % — ABNORMAL LOW (ref 39.0–52.0)
Hemoglobin: 11.8 g/dL — ABNORMAL LOW (ref 13.0–17.0)
Immature Granulocytes: 0 %
Lymphocytes Relative: 21 %
Lymphs Abs: 2 10*3/uL (ref 0.7–4.0)
MCH: 30.3 pg (ref 26.0–34.0)
MCHC: 33.6 g/dL (ref 30.0–36.0)
MCV: 90.2 fL (ref 80.0–100.0)
Monocytes Absolute: 0.7 10*3/uL (ref 0.1–1.0)
Monocytes Relative: 7 %
Neutro Abs: 6.9 10*3/uL (ref 1.7–7.7)
Neutrophils Relative %: 69 %
Platelets: 234 10*3/uL (ref 150–400)
RBC: 3.89 MIL/uL — ABNORMAL LOW (ref 4.22–5.81)
RDW: 15.4 % (ref 11.5–15.5)
WBC: 9.9 10*3/uL (ref 4.0–10.5)
nRBC: 0 % (ref 0.0–0.2)

## 2022-01-28 LAB — TSH: TSH: 3.82 u[IU]/mL (ref 0.350–4.500)

## 2022-01-28 MED ORDER — HEPARIN SOD (PORK) LOCK FLUSH 100 UNIT/ML IV SOLN
500.0000 [IU] | Freq: Once | INTRAVENOUS | Status: AC | PRN
  Administered 2022-01-28: 500 [IU]
  Filled 2022-01-28: qty 5

## 2022-01-28 NOTE — Progress Notes (Signed)
No treatment today, port deaccessed, Discharged, stable

## 2022-01-28 NOTE — Progress Notes (Signed)
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  He had a lung cancer screening protocol on 05/29/2021 which showed a right lower lobe lung mass with concern for bilateral adrenal masses.  This was followed by a PET CT scan on 06/15/2021 which showed a right lower lobe soft tissue mass with an SUV uptake of 16.6.  No hypermetabolic hilar or mediastinal adenopathy.  Bilateral hypermetabolic adrenal masses 6.7 and 5.1 cm respectively.  2.8 cm soft tissue lesion in the body of the pancreas and 1.9 cm soft tissue lesion in the tail of the pancreas.  2.3 cm retroperitoneal soft tissue nodule.  10 cm x 6.4 cm soft tissue mass inferior to the transverse colon in the midline.  4.1 x 2.4 cm right mesenteric soft tissue mass with an SUV of 11.7.   MRI brain was significant for multiple areas of intracranial metastatic disease.  Largest lesion in the left frontal lobe with significant edema internal hemorrhage and is causing a mass effect with 6 mm midline shift.  30 metastatic foci seen in the brain.  Patient is undergoing whole brain radiation for the same.     Abdominal mass biopsy showed high-grade carcinoma compatible with metastatic small cell carcinoma of lung origin.   Patient completed 4 cycles of carbo etoposide Tecentriq chemotherapy.  Scan showed good response to treatment and he is presently on maintenance Tecentriq  Interval history-  Patient returns to clinic for follow up and consideration of resuming maintenance tecentriq. In interim he was admitted to hospital for progressively worsening weakness, diagnosed with lobar pneumonia. Imaging was concerning for progressive disease vs infection. He last received cycle 2 of maintenance tecentriq on 11/11/21. He has declined interval f/u d/t preference. He says that his strength has gradually been improving with treatment holiday. He's drinking 5 ensure a day. Had gained some weight but possibly lost some more recently. Legs continue to be weak. Cough has nearly resolved. He has neck pain which resolves with repositioning. No headaches. Eats some small amounts of food intermittently but continues to have taste changes. Wife concerned about ongoing cost of ensure and requests   ECOG PS- 2 Pain scale- 0 Opioid associated constipation- no  Review of systems- Review of Systems  Constitutional:  Positive for malaise/fatigue and weight loss. Negative for chills and fever.       Lack of appetite  HENT:  Negative for congestion, ear discharge and nosebleeds.   Eyes:  Negative for blurred vision.  Respiratory:  Negative for cough, hemoptysis, sputum production, shortness of breath and wheezing.   Cardiovascular:  Negative for chest pain, palpitations, orthopnea and claudication.  Gastrointestinal:  Negative for abdominal pain, blood in stool, constipation, diarrhea, heartburn, melena, nausea and vomiting.  Genitourinary:  Negative for dysuria, flank pain, frequency, hematuria and urgency.  Musculoskeletal:  Positive for neck pain. Negative for back pain, joint pain and myalgias.  Skin:  Negative for rash.  Neurological:  Positive for weakness. Negative for dizziness, tingling, focal weakness, seizures  and headaches.  Endo/Heme/Allergies:  Does not bruise/bleed easily.  Psychiatric/Behavioral:  Negative for depression and suicidal ideas. The patient does not have insomnia.      Allergies   Allergen Reactions   Bee Venom Anaphylaxis    Past Medical History:  Diagnosis Date   Cancer (HCC)    COPD (chronic obstructive pulmonary disease) (HCC)    Dyspnea     Past Surgical History:  Procedure Laterality Date   CATARACT EXTRACTION Right 03/10/2021   IR IMAGING GUIDED PORT INSERTION  07/27/2021    Social History   Socioeconomic History   Marital status: Married    Spouse name: Not on file   Number of children: Not on file   Years of education: Not on file   Highest education level: Not on file  Occupational History   Not on file  Tobacco Use   Smoking status: Former    Packs/day: 2.00    Years: 51.00    Total pack years: 102.00    Types: Cigarettes    Quit date: 11/07/2021    Years since quitting: 0.2   Smokeless tobacco: Never  Vaping Use   Vaping Use: Never used  Substance and Sexual Activity   Alcohol use: Not Currently   Drug use: Never   Sexual activity: Yes  Other Topics Concern   Not on file  Social History Narrative   Not on file   Social Determinants of Health   Financial Resource Strain: Low Risk  (07/13/2021)   Overall Financial Resource Strain (CARDIA)    Difficulty of Paying Living Expenses: Not hard at all  Food Insecurity: No Food Insecurity (11/25/2021)   Hunger Vital Sign    Worried About Running Out of Food in the Last Year: Never true    Ran Out of Food in the Last Year: Never true  Transportation Needs: No Transportation Needs (11/25/2021)   PRAPARE - Administrator, Civil Service (Medical): No    Lack of Transportation (Non-Medical): No  Physical Activity: Inactive (07/20/2019)   Exercise Vital Sign    Days of Exercise per Week: 0 days    Minutes of Exercise per Session: 0 min  Stress: Stress Concern Present (07/13/2021)   [employer] of Occupational Health - Occupational Stress Questionnaire    Feeling of Stress : To some extent  Social Connections: Moderately Isolated (07/13/2021)   Social Connection and  Isolation Panel [NHANES]    Frequency of Communication with Friends and Family: More than three times a week    Frequency of Social Gatherings with Friends and Family: Twice a week    Attends Religious Services: Never    Database administrator or Organizations: No    Attends Banker Meetings: Never    Marital Status: Married  Catering manager Violence: Not At Risk (11/25/2021)   Humiliation, Afraid, Rape, and Kick questionnaire    Fear of Current or Ex-Partner: No    Emotionally Abused: No    Physically Abused: No    Sexually Abused: No    Family History  Problem Relation Age of Onset   Diabetes Mother    Hypertension Mother    Diabetes Father    Hypertension Father    Diabetes Sister    Cancer - Colon Cousin      Current Outpatient Medications:    acetaminophen (TYLENOL) 325 MG tablet, Take 2 tablets (650 mg total) by mouth every 6 (six) hours as needed for mild pain, moderate pain, fever or headache.,  Disp: , Rfl:    albuterol (PROVENTIL) (2.5 MG/3ML) 0.083% nebulizer solution, Take 3 mLs (2.5 mg total) by nebulization every 6 (six) hours as needed for wheezing or shortness of breath., Disp: 75 mL, Rfl: 12   budesonide (PULMICORT) 0.25 MG/2ML nebulizer solution, Take 2 mLs (0.25 mg total) by nebulization 2 (two) times daily as needed., Disp: 60 mL, Rfl: 12   Docusate Sodium (COLACE PO), Take 1 capsule by mouth as needed (constipation)., Disp: , Rfl:    dronabinol (MARINOL) 5 MG capsule, Take 1 capsule (5 mg total) by mouth 2 (two) times daily before lunch and supper., Disp: 30 capsule, Rfl: 0   feeding supplement (ENSURE ENLIVE / ENSURE PLUS) LIQD, Take 237 mLs by mouth 4 (four) times daily., Disp: 237 mL, Rfl: 12   gentamicin (GARAMYCIN) 0.3 % ophthalmic solution, Place 2 drops into both eyes every 4 (four) hours., Disp: , Rfl:    lidocaine-prilocaine (EMLA) cream, Apply 1 Application topically as needed. Apply to [facility] a cath prior to access, Disp: , Rfl:     loperamide (IMODIUM) 2 MG capsule, Take 2 mg by mouth as needed for diarrhea or loose stools., Disp: , Rfl:    loratadine (CLARITIN) 10 MG tablet, Take 10 mg by mouth daily., Disp: , Rfl:    mirtazapine (REMERON) 15 MG tablet, Take 1 tablet (15 mg total) by mouth at bedtime., Disp: 90 tablet, Rfl: 1   Multiple Vitamin (MULTIVITAMIN WITH MINERALS) TABS tablet, Take 1 tablet by mouth daily., Disp: , Rfl:    nystatin (MYCOSTATIN) 100000 UNIT/ML suspension, Take 5 mLs (500,000 Units total) by mouth 3 (three) times daily., Disp: 180 mL, Rfl: 0   ofloxacin (OCUFLOX) 0.3 % ophthalmic solution, Place 5 drops into the right ear 2 (two) times daily., Disp: , Rfl:    OLANZapine (ZYPREXA) 10 MG tablet, TAKE 1 TABLET BY MOUTH EVERYDAY AT BEDTIME, Disp: 30 tablet, Rfl: 0   ondansetron (ZOFRAN) 4 MG tablet, Take 1 tablet (4 mg total) by mouth every 8 (eight) hours as needed for nausea or vomiting., Disp: 60 tablet, Rfl: 3   oxyCODONE (OXY IR/ROXICODONE) 5 MG immediate release tablet, Take 1 tablet (5 mg total) by mouth every 4 (four) hours as needed for severe pain., Disp: 60 tablet, Rfl: 0   pantoprazole (PROTONIX) 20 MG tablet, TAKE 1 TABLET BY MOUTH EVERY DAY, Disp: 90 tablet, Rfl: 0   potassium chloride SA (KLOR-CON M) 20 MEQ tablet, Take 20 mEq by mouth 2 (two) times daily., Disp: , Rfl:    prochlorperazine (COMPAZINE) 10 MG tablet, Take 1 tablet (10 mg total) by mouth every 6 (six) hours as needed for nausea or vomiting., Disp: 60 tablet, Rfl: 2  Physical exam:  Vitals:   01/28/22 1439  BP: 120/79  Pulse: 92  Temp: 98.9 F (37.2 C)  TempSrc: Tympanic  SpO2: 92%  Weight: 158 lb (71.7 kg)  Height: 5\' 8"  (1.727 m)   Physical Exam Vitals reviewed.  Constitutional:      General: He is not in acute distress. Pulmonary:     Effort: Pulmonary effort is normal.  Abdominal:     General: Bowel sounds are normal. There is no distension.     Palpations: Abdomen is soft.  Skin:    General: Skin is warm  and dry.     Comments: Less pale appearing than previously  Neurological:     Mental Status: He is alert and oriented to person, place, and time.  Psychiatric:  Mood and Affect: Mood normal.        Behavior: Behavior normal.         Latest Ref Rng & Units 01/28/2022    2:05 PM  CMP  Glucose 70 - 99 mg/dL 196   BUN 8 - 23 mg/dL 15   Creatinine 2.22 - 1.24 mg/dL 9.79   Sodium 892 - 119 mmol/L 134   Potassium 3.5 - 5.1 mmol/L 3.8   Chloride 98 - 111 mmol/L 100   CO2 22 - 32 mmol/L 25   Calcium 8.9 - 10.3 mg/dL 8.9   Total Protein 6.5 - 8.1 g/dL 7.9   Total Bilirubin 0.3 - 1.2 mg/dL 0.3   Alkaline Phos 38 - 126 U/L 98   AST 15 - 41 U/L 33   ALT 0 - 44 U/L 28       Latest Ref Rng & Units 01/28/2022    2:05 PM  CBC  WBC 4.0 - 10.5 K/uL 9.9   Hemoglobin 13.0 - 17.0 g/dL 41.7   Hematocrit 40.8 - 52.0 % 35.1   Platelets 150 - 400 K/uL 234     No images are attached to the encounter.  NM Bone Scan Whole Body  Result Date: 12/31/2021 CLINICAL DATA:  Staging small cell lung cancer EXAM: NUCLEAR MEDICINE WHOLE BODY BONE SCAN TECHNIQUE: Whole body anterior and posterior images were obtained approximately 3 hours after intravenous injection of radiopharmaceutical. RADIOPHARMACEUTICALS:  21.60 mCi Technetium-2m MDP IV COMPARISON:  None Radiographic correlation: CT chest 11/25/2021, CT chest abdomen pelvis 09/18/2021 FINDINGS: Mild uptake at the shoulders and sternoclavicular joints, typically degenerative. No worrisome sites of abnormal tracer accumulation are seen to suggest osseous metastases. Urinary contamination at perineum. Expected urinary tract and soft tissue distribution of tracer. IMPRESSION: No scintigraphic evidence of osseous metastatic disease. Electronically Signed   By: Ulyses Southward M.D.   On: 12/31/2021 17:58     Assessment and plan- Patient is a 67 y.o. male   Extensive stage small cell lung cancer s/p 4 cycles of carbo etoposide Tecentriq chemotherapy and 2  cycles of maintenance tecentriq. On tx holiday since early November 2023 d/t preference. Tx complicated by ongoing weakness and hospitalization for pneumonia. Patient preference is to hold treatment today. Will plan for imaging to assess disease state. High risk of progression. Reviewed with Dr. Smith Robert who recommends for mri brain, ct chest abdomen pelvis. Hold bone scan given that it was negative for disease in December 2023.  Lobar pneumonia- s/p hospitalization. Symptomatically resolved Weight loss - d/t taste changes, malignancy, treatment, deconditioning. Concern for continuing ensure d/t cost. Alphonse Guild, RD was able to assist patient today with samples and ongoing needs.  Goals of care- treatment given with palliative intent. High suspicion that patient has active disease given his initial high disease burden. However, his quality of life has improved with treatment holiday and he prefers to hold treatment for now. He says he will consider taking treatment in the future if recommended.   Disposition:  No tx today; deaccess Mri brain w wo contrast Ct chest abdomen pelvis w contrast See Dr Smith Robert few days later for tx planning- la    Visit Diagnosis 1. Weakness   2. Small cell lung cancer (HCC)   3. Cancer cachexia (HCC)    Consuello Masse, DNP, AGNP-C, AOCNP [facility] [phone] (clinic) 01/28/2022

## 2022-01-28 NOTE — Progress Notes (Signed)
Nutrition  Complimentary case of ensure enlive vanilla given to patient today.  Wife says that patient is drinking about 5 a day.  Does not really want anything else.  Holding treatment at this time.   Coupons also given.   Alysabeth Scalia B. Freida Busman, RD, LDN Registered Dietitian [phone]

## 2022-01-29 LAB — T4: T4, Total: 7.5 ug/dL (ref 4.5–12.0)

## 2022-02-04 ENCOUNTER — Ambulatory Visit: Payer: Medicare HMO

## 2022-02-05 ENCOUNTER — Ambulatory Visit: Payer: Medicare HMO | Admitting: Oncology

## 2022-02-05 ENCOUNTER — Other Ambulatory Visit: Payer: Medicare HMO

## 2022-02-05 ENCOUNTER — Ambulatory Visit: Payer: Medicare HMO

## 2022-02-08 ENCOUNTER — Ambulatory Visit: Admission: RE | Admit: 2022-02-08 | Payer: Medicare HMO | Source: Ambulatory Visit

## 2022-02-12 ENCOUNTER — Telehealth: Payer: Self-pay

## 2022-02-12 NOTE — Progress Notes (Unsigned)
Note marked as in Error

## 2022-02-17 ENCOUNTER — Telehealth: Payer: Self-pay

## 2022-02-17 NOTE — Telephone Encounter (Signed)
PT spouse called to req call to recieve case of Vanilla Ensure drinks and would like to contacted.

## 2022-02-17 NOTE — Telephone Encounter (Signed)
Nutrition  Received message that wife wanted another case of vanilla ensure for patient.    RD called and talked with wife.  RD currently only has chocolate case of ensure available.  RD has reached out to supplier and vanilla is currently out of stock.  Patient only likes vanilla.  Wife asked for case of vanilla boost. RD only has a few bottles, not case of vanilla boost.  Wife declined. Offered to mail coupons for ensure and wife declined.    Wife wanted to cancel appointments for lab, MD and infusion for Friday, Feb 9th.    RD sent message to Dr Elroy Channel team letting them know that wife wants to cancel upcoming appointments.   Maurice Simpson, Maurice Simpson, [place] Registered Dietitian [phone]

## 2022-02-19 ENCOUNTER — Inpatient Hospital Stay: Payer: Medicare HMO

## 2022-02-19 ENCOUNTER — Inpatient Hospital Stay: Payer: Medicare HMO | Admitting: Oncology

## 2022-02-23 ENCOUNTER — Ambulatory Visit: Admission: RE | Admit: 2022-02-23 | Payer: Medicare HMO | Source: Ambulatory Visit

## 2022-02-26 ENCOUNTER — Telehealth: Payer: Self-pay | Admitting: Oncology

## 2022-02-26 ENCOUNTER — Ambulatory Visit: Payer: Medicare HMO

## 2022-02-26 ENCOUNTER — Ambulatory Visit: Payer: Medicare HMO | Admitting: Oncology

## 2022-02-26 ENCOUNTER — Other Ambulatory Visit: Payer: Medicare HMO

## 2022-02-26 ENCOUNTER — Inpatient Hospital Stay: Payer: Medicare HMO | Admitting: Oncology

## 2022-02-26 NOTE — Telephone Encounter (Signed)
pt wife called in to req Ct to be r/s. Message sent to [facility] to scheduled pt and notify me with appt information to scheduled FU appt with MD

## 2022-03-03 ENCOUNTER — Encounter: Payer: Self-pay | Admitting: Oncology

## 2022-03-15 ENCOUNTER — Other Ambulatory Visit: Payer: Self-pay | Admitting: *Deleted

## 2022-03-15 DIAGNOSIS — C3490 Malignant neoplasm of unspecified part of unspecified bronchus or lung: Secondary | ICD-10-CM

## 2022-03-15 NOTE — Progress Notes (Signed)
Wife of pt came in to get visa card and was told that they can't get that anymore because he is not on treatment since December 2023. The rules for getting help if you are on a treatment plan and having needs. Wife said she wanted him to be able to get ct scan ,  I spoke to Janese Banks and she said that it is fine to get scan and then see Dr. Janese Banks after the results are done and go from there

## 2022-03-29 ENCOUNTER — Ambulatory Visit: Admission: RE | Admit: 2022-03-29 | Payer: Medicare HMO | Source: Ambulatory Visit

## 2022-03-30 ENCOUNTER — Inpatient Hospital Stay: Payer: Medicare HMO

## 2022-03-30 ENCOUNTER — Ambulatory Visit
Admission: RE | Admit: 2022-03-30 | Discharge: 2022-03-30 | Disposition: A | Discharge status: Home / Self Care | Payer: Medicare HMO | Attending: Hospice and Palliative Medicine | Admitting: Hospice and Palliative Medicine

## 2022-03-30 ENCOUNTER — Other Ambulatory Visit: Payer: Self-pay

## 2022-03-30 ENCOUNTER — Inpatient Hospital Stay: Payer: Medicare HMO | Attending: Oncology | Admitting: Hospice and Palliative Medicine

## 2022-03-30 ENCOUNTER — Telehealth: Payer: Self-pay | Admitting: *Deleted

## 2022-03-30 ENCOUNTER — Ambulatory Visit
Admission: RE | Admit: 2022-03-30 | Discharge: 2022-03-30 | Disposition: A | Discharge status: Home / Self Care | Payer: Medicare HMO | Source: Ambulatory Visit | Attending: Hospice and Palliative Medicine | Admitting: Hospice and Palliative Medicine

## 2022-03-30 ENCOUNTER — Encounter: Payer: Self-pay | Admitting: Hospice and Palliative Medicine

## 2022-03-30 VITALS — BP 125/69 | HR 93 | Temp 97.6°F | Resp 18

## 2022-03-30 DIAGNOSIS — C7931 Secondary malignant neoplasm of brain: Secondary | ICD-10-CM | POA: Insufficient documentation

## 2022-03-30 DIAGNOSIS — C3490 Malignant neoplasm of unspecified part of unspecified bronchus or lung: Secondary | ICD-10-CM | POA: Diagnosis not present

## 2022-03-30 DIAGNOSIS — M25562 Pain in left knee: Secondary | ICD-10-CM

## 2022-03-30 DIAGNOSIS — C3431 Malignant neoplasm of lower lobe, right bronchus or lung: Secondary | ICD-10-CM | POA: Insufficient documentation

## 2022-03-30 DIAGNOSIS — R29898 Other symptoms and signs involving the musculoskeletal system: Secondary | ICD-10-CM | POA: Diagnosis not present

## 2022-03-30 DIAGNOSIS — R296 Repeated falls: Secondary | ICD-10-CM | POA: Diagnosis not present

## 2022-03-30 DIAGNOSIS — R531 Weakness: Secondary | ICD-10-CM | POA: Insufficient documentation

## 2022-03-30 DIAGNOSIS — Z95828 Presence of other vascular implants and grafts: Secondary | ICD-10-CM

## 2022-03-30 LAB — CBC WITH DIFFERENTIAL (CANCER CENTER ONLY)
Abs Immature Granulocytes: 0.03 10*3/uL (ref 0.00–0.07)
Basophils Absolute: 0.1 10*3/uL (ref 0.0–0.1)
Basophils Relative: 1 %
Eosinophils Absolute: 0.3 10*3/uL (ref 0.0–0.5)
Eosinophils Relative: 3 %
HCT: 32.2 % — ABNORMAL LOW (ref 39.0–52.0)
Hemoglobin: 10.8 g/dL — ABNORMAL LOW (ref 13.0–17.0)
Immature Granulocytes: 0 %
Lymphocytes Relative: 30 %
Lymphs Abs: 2.9 10*3/uL (ref 0.7–4.0)
MCH: 30.4 pg (ref 26.0–34.0)
MCHC: 33.5 g/dL (ref 30.0–36.0)
MCV: 90.7 fL (ref 80.0–100.0)
Monocytes Absolute: 0.6 10*3/uL (ref 0.1–1.0)
Monocytes Relative: 6 %
Neutro Abs: 5.9 10*3/uL (ref 1.7–7.7)
Neutrophils Relative %: 60 %
Platelet Count: 244 10*3/uL (ref 150–400)
RBC: 3.55 MIL/uL — ABNORMAL LOW (ref 4.22–5.81)
RDW: 14.3 % (ref 11.5–15.5)
WBC Count: 9.7 10*3/uL (ref 4.0–10.5)
nRBC: 0 % (ref 0.0–0.2)

## 2022-03-30 LAB — CMP (CANCER CENTER ONLY)
ALT: 27 U/L (ref 0–44)
AST: 39 U/L (ref 15–41)
Albumin: 3.3 g/dL — ABNORMAL LOW (ref 3.5–5.0)
Alkaline Phosphatase: 84 U/L (ref 38–126)
Anion gap: 6 (ref 5–15)
BUN: 25 mg/dL — ABNORMAL HIGH (ref 8–23)
CO2: 25 mmol/L (ref 22–32)
Calcium: 9 mg/dL (ref 8.9–10.3)
Chloride: 100 mmol/L (ref 98–111)
Creatinine: 0.76 mg/dL (ref 0.61–1.24)
GFR, Estimated: >60 mL/min (ref >60)
Glucose, Bld: 107 mg/dL — ABNORMAL HIGH (ref 70–99)
Potassium: 4.1 mmol/L (ref 3.5–5.1)
Sodium: 131 mmol/L — ABNORMAL LOW (ref 135–145)
Total Bilirubin: 0.6 mg/dL (ref 0.3–1.2)
Total Protein: 8.4 g/dL — ABNORMAL HIGH (ref 6.5–8.1)

## 2022-03-30 MED ORDER — SODIUM CHLORIDE 0.9% FLUSH
10.0000 mL | Freq: Once | INTRAVENOUS | Status: AC
  Administered 2022-03-30: 10 mL via INTRAVENOUS
  Filled 2022-03-30: qty 10

## 2022-03-30 MED ORDER — OXYCODONE HCL 5 MG PO TABS: 5.0000 mg | ORAL_TABLET | ORAL | 0 refills | Status: DC | PRN

## 2022-03-30 MED ORDER — HEPARIN SOD (PORK) LOCK FLUSH 100 UNIT/ML IV SOLN
500.0000 [IU] | Freq: Once | INTRAVENOUS | Status: AC
  Administered 2022-03-30: 500 [IU]
  Filled 2022-03-30: qty 5

## 2022-03-30 NOTE — Telephone Encounter (Signed)
Please see him today if possible and d/w me prior

## 2022-03-30 NOTE — Progress Notes (Signed)
Nutrition  Wife requesting assistance with vanilla ensure shakes.  Samples of vanilla ensure complete and vanilla [employer] 1.4 given to patient along with coupons.   Maurice Simpson, [place], [place] Registered Dietitian [phone]

## 2022-03-30 NOTE — Progress Notes (Signed)
Symptom Management [place] at [facility] [phone] [phone]  Patient Care Team: [name], NP as PCP - General (Nurse Practitioner) Telford Nab, RN as Oncology Nurse Navigator   NAME OF PATIENT: Maurice Simpson  [id number]  1955/12/29   DATE OF VISIT: 03/30/22  REASON FOR CONSULT: Maurice Simpson is a 67 y.o. 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   INTERVAL HISTORY: Patient last seen by Beckey Rutter, NP on 01/28/2022 for consideration of resuming maintenance Tecentriq.  Patient last received cycle 2 Tecentriq on 11/11/2021.  He declined restarting treatment but agreed to repeat imaging.  However, patient never obtained requested imaging and has cancelled multiple follow up appointments. .  He now presents to clinic today with complaint of weakness, recurrent falls, and knee pain patient is mostly now in the wheelchair at home.  He has had a couple falls with transfers where he fell onto both of his knees.  He subsequently had knee pain L>R.  Patient has been taking Tylenol as needed and occasional oxycodone.  Denies any neurologic complaints. Denies recent fevers or illnesses. Denies any easy bleeding or bruising. Reports poor appetite . Denies chest pain. Denies any nausea, vomiting, constipation, or diarrhea. Denies urinary complaints. Patient offers no further specific complaints today.  PAST MEDICAL HISTORY: Past Medical History:  Diagnosis Date   Cancer ([place])    COPD (chronic obstructive pulmonary disease) ([place])    Dyspnea     PAST SURGICAL HISTORY:  Past Surgical History:  Procedure Laterality Date   CATARACT EXTRACTION Right 03/10/2021   IR IMAGING GUIDED PORT INSERTION  07/27/2021     HEMATOLOGY/ONCOLOGY HISTORY:  Oncology History  Small cell lung cancer ([place])  07/21/2021 Initial Diagnosis   Small cell lung cancer ([place])   07/21/2021 Cancer Staging   Staging form: Lung, AJCC 8th Edition - Clinical stage from 07/21/2021: Stage IVB (cT2, cN0, pM1c) - Signed by Sindy Guadeloupe, MD on 07/21/2021   08/03/2021 - 09/01/2021 Chemotherapy   Patient is on Treatment Plan : LUNG SCLC Carboplatin + Etoposide + Atezolizumab Induction q21d / Atezolizumab Maintenance q21d     09/01/2021 -  Chemotherapy   Patient is on Treatment Plan : LUNG SCLC Carboplatin + Etoposide + Atezolizumab Induction q21d x 4 cycles / Atezolizumab Maintenance q21d       ALLERGIES:  is allergic to bee venom.  MEDICATIONS:  Current Outpatient Medications  Medication Sig Dispense Refill   acetaminophen (TYLENOL) 325 MG tablet Take 2 tablets (650 mg total) by mouth every 6 (six) hours as needed for mild pain, moderate pain, fever or headache.     albuterol (PROVENTIL) (2.5 MG/3ML) 0.083% nebulizer solution Take 3 mLs (2.5 mg total) by nebulization every 6 (six) hours as needed for wheezing or shortness of breath. 75 mL 12   budesonide (PULMICORT) 0.25 MG/2ML nebulizer solution Take 2 mLs (0.25 mg total) by nebulization 2 (two) times daily as needed. 60 mL 12   Docusate Sodium (COLACE PO) Take 1 capsule by mouth as needed (constipation).     dronabinol (MARINOL) 5 MG capsule Take 1 capsule (5 mg total) by mouth 2 (two) times daily before lunch and supper. 30 capsule 0   feeding supplement (ENSURE ENLIVE / ENSURE PLUS) LIQD Take 237 mLs by mouth 4 (four) times daily.  237 mL 12   gentamicin (GARAMYCIN) 0.3 % ophthalmic solution Place 2 drops into both eyes every 4 (four) hours.     lidocaine-prilocaine (EMLA) cream Apply 1 Application topically as needed. Apply to [facility] a cath prior to access     loperamide (IMODIUM) 2 MG capsule Take 2 mg by mouth as needed for diarrhea or loose stools.     loratadine (CLARITIN) 10  MG tablet Take 10 mg by mouth daily.     mirtazapine (REMERON) 15 MG tablet Take 1 tablet (15 mg total) by mouth at bedtime. 90 tablet 1   Multiple Vitamin (MULTIVITAMIN WITH MINERALS) TABS tablet Take 1 tablet by mouth daily.     nystatin (MYCOSTATIN) 100000 UNIT/ML suspension Take 5 mLs (500,000 Units total) by mouth 3 (three) times daily. 180 mL 0   ofloxacin (OCUFLOX) 0.3 % ophthalmic solution Place 5 drops into the right ear 2 (two) times daily.     OLANZapine (ZYPREXA) 10 MG tablet TAKE 1 TABLET BY MOUTH EVERYDAY AT BEDTIME 30 tablet 0   ondansetron (ZOFRAN) 4 MG tablet Take 1 tablet (4 mg total) by mouth every 8 (eight) hours as needed for nausea or vomiting. 60 tablet 3   oxyCODONE (OXY IR/ROXICODONE) 5 MG immediate release tablet Take 1 tablet (5 mg total) by mouth every 4 (four) hours as needed for severe pain. 60 tablet 0   pantoprazole (PROTONIX) 20 MG tablet TAKE 1 TABLET BY MOUTH EVERY DAY 90 tablet 0   potassium chloride SA (KLOR-CON M) 20 MEQ tablet Take 20 mEq by mouth 2 (two) times daily.     prochlorperazine (COMPAZINE) 10 MG tablet Take 1 tablet (10 mg total) by mouth every 6 (six) hours as needed for nausea or vomiting. 60 tablet 2   No current facility-administered medications for this visit.    VITAL SIGNS: BP 125/69   Pulse 93   Temp 97.6 F (36.4 C) (Tympanic)   Resp 18   SpO2 98%  There were no vitals filed for this visit.   Estimated body mass index is 24.02 kg/m as calculated from the following:   Height as of 01/28/22: 5\' 8"  (1.727 m).   Weight as of 01/28/22: 158 lb (71.7 kg).  LABS: CBC:    Component Value Date/Time   WBC 9.9 01/28/2022 1405   HGB 11.8 (L) 01/28/2022 1405   HGB 18.0 (H) 04/30/2021 1136   HCT 35.1 (L) 01/28/2022 1405   HCT 53.3 (H) 04/30/2021 1136   PLT 234 01/28/2022 1405   PLT 156 04/30/2021 1136   MCV 90.2 01/28/2022 1405   MCV 91 04/30/2021 1136   MCV 94 01/21/2011 0134   NEUTROABS 6.9 01/28/2022 1405   NEUTROABS 4.8  04/30/2021 1136   LYMPHSABS 2.0 01/28/2022 1405   LYMPHSABS 2.9 04/30/2021 1136   MONOABS 0.7 01/28/2022 1405   EOSABS 0.2 01/28/2022 1405   EOSABS 0.3 04/30/2021 1136   BASOSABS 0.1 01/28/2022 1405   BASOSABS 0.1 04/30/2021 1136   Comprehensive Metabolic Panel:    Component Value Date/Time   NA 134 (L) 01/28/2022 1405   NA 136 04/30/2021 1136   NA 139 01/21/2011 0134   K 3.8 01/28/2022 1405   K 3.8 01/21/2011 0134   CL 100 01/28/2022 1405   CL 105 01/21/2011 0134   CO2 25 01/28/2022 1405   CO2 23 01/21/2011 0134   BUN 15 01/28/2022 1405   BUN 12 04/30/2021 1136   BUN 15 01/21/2011 0134   CREATININE 0.75 01/28/2022 1405  CREATININE 0.92 01/21/2011 0134   GLUCOSE 141 (H) 01/28/2022 1405   GLUCOSE 118 (H) 01/21/2011 0134   CALCIUM 8.9 01/28/2022 1405   CALCIUM 9.1 01/21/2011 0134   AST 33 01/28/2022 1405   AST 20 01/21/2011 0134   ALT 28 01/28/2022 1405   ALT 29 01/21/2011 0134   ALKPHOS 98 01/28/2022 1405   ALKPHOS 46 (L) 01/21/2011 0134   BILITOT 0.3 01/28/2022 1405   BILITOT 0.8 04/30/2021 1136   BILITOT 0.8 01/21/2011 0134   PROT 7.9 01/28/2022 1405   PROT 7.2 04/30/2021 1136   PROT 7.4 01/21/2011 0134   ALBUMIN 3.2 (L) 01/28/2022 1405   ALBUMIN 4.3 04/30/2021 1136   ALBUMIN 3.8 01/21/2011 0134    RADIOGRAPHIC STUDIES: No results found.  PERFORMANCE STATUS (ECOG) : 2 - Symptomatic, <50% confined to bed  Review of Systems Unless otherwise noted, a complete review of systems is negative.  Physical Exam General: NAD Cardiovascular: regular rate and rhythm Pulmonary: clear ant fields Abdomen: soft, nontender, + bowel sounds GU: no suprapubic tenderness Extremities: no edema, no joint deformities Skin: no rashes Neurological: Generalized weakness but otherwise grossly nonfocal  IMPRESSION/PLAN: Extensive stage small cell lung cancer -had a lengthy conversation with patient and wife today regarding goals.  He has been on treatment holiday at his request  since November 2023.  He was only seen intermittently in the cancer center due to frequent no-shows/cancellations.  He has canceled imaging several times.  Today, patient says that he would be perfectly happy if he did not have to come back to the cancer center.  I explained that we would very much support him staying home and providing comfort measures with recommendation for hospice.  However, wife appeared to physically recoil at the word hospice and stated that they were not ready for that.  We had a frank conversation regarding patient's poor prognosis and the terminal nature of his cancer.  Wife states that she still has a strong belief that God will cure him and disagreed that patient could be nearing end-of-life.  Ultimately, wife would like to obtain CTs.  However, she said that she would not discuss goals or what they would opt to do with that information.  In light of multiple previous cancellations, I explained that this would likely be the last attempt to reschedule.  They are also aware that his poor performance status may exclude any further options for treatment at which time our recommendation would be hospice/best supportive care.  Weakness/fall -neuro exam is nonfocal.  No swelling to knees or LEs.  Patient has clearly declined over the past several months now with performance status 3-4.  Will send patient for x-ray of the knee.  Wife has requested home health PT, although, I explained that his condition is unlikely to have significant improvement from rehab in light of advanced cancer.  Pain - likely combination of neoplasm and possible musculoskeletal injury from falls.  Will refill oxycodone.  Code status - patient/wife refused to discuss.   Case and plan discussed with Dr. Janese Banks.  Follow-up with Dr. Janese Banks after imaging  Patient expressed understanding and was in agreement with this plan. He also understands that He can call clinic at any time with any questions, concerns, or complaints.    Thank you for allowing me to participate in the care of this very pleasant patient.   Time Total: 60 minutes  Visit consisted of counseling and education dealing with the complex and emotionally intense issues of symptom management  in the setting of serious illness.Greater than 50%  of this time was spent counseling and coordinating care related to the above assessment and plan.  Signed by: Altha Harm, PhD, NP-C

## 2022-03-30 NOTE — Telephone Encounter (Signed)
Patient wife called asking that patient come in today for evaluation for leg pain bilateral more in his knees and left leg and foot worse. She reports that he has had several falls also Please advise

## 2022-03-31 ENCOUNTER — Telehealth: Payer: Self-pay | Admitting: *Deleted

## 2022-03-31 DIAGNOSIS — M769 Unspecified enthesopathy, lower limb, excluding foot: Secondary | ICD-10-CM

## 2022-03-31 NOTE — Telephone Encounter (Signed)
Wife called asking for results of labs and xray done yesterday  CLINICAL DATA:  Left knee pain.   EXAM: LEFT KNEE - 1-2 VIEW   COMPARISON:  None Available.   FINDINGS: Two views. No evidence of fracture, dislocation, or joint effusion. Quadriceps tendon enthesopathy along superior patella. Small exostosis along the medial aspect of the right medial femoral condyle. Soft tissues are unremarkable.   IMPRESSION: No acute fracture, dislocation or joint effusion. Quadriceps tendon enthesopathy.     Electronically Signed   By: Emmit Alexanders M.D.   On: 03/30/2022 16:18           Component Ref Range & Units 1 d ago (03/30/22) 2 mo ago (01/28/22) 3 mo ago (12/15/21) 4 mo ago (12/01/21) 4 mo ago (11/27/21) 4 mo ago (11/26/21) 4 mo ago (11/25/21)  WBC Count 4.0 - 10.5 K/uL 9.7 9.9 11.3 High  10.9 High  8.9 14.4 High  23.7 High   RBC 4.22 - 5.81 MIL/uL 3.55 Low  3.89 Low  4.38 3.94 Low  3.16 Low  3.10 Low  3.71 Low   Hemoglobin 13.0 - 17.0 g/dL 10.8 Low  11.8 Low  13.0 11.6 Low  9.2 Low  9.0 Low  10.9 Low   HCT 39.0 - 52.0 % 32.2 Low  35.1 Low  39.6 36.3 Low  28.6 Low  28.2 Low  34.0 Low   MCV 80.0 - 100.0 fL 90.7 90.2 90.4 92.1 90.5 91.0 91.6  MCH 26.0 - 34.0 pg 30.4 30.3 29.7 29.4 29.1 29.0 29.4  MCHC 30.0 - 36.0 g/dL 33.5 33.6 32.8 32.0 32.2 31.9 32.1  RDW 11.5 - 15.5 % 14.3 15.4 16.8 High  16.5 High  16.0 High  16.0 High  16.0 High   Platelet Count 150 - 400 K/uL 244 234 254 347 228 232 272  nRBC 0.0 - 0.2 % 0.0 0.0 0.0 0.0 0.0 0.0 CM 0.0 CM  Neutrophils Relative % % 60 69 62 63 59    Neutro Abs 1.7 - 7.7 K/uL 5.9 6.9 7.1 6.8 5.4    Lymphocytes Relative % 30 21 25 21 24     Lymphs Abs 0.7 - 4.0 K/uL 2.9 2.0 2.8 2.3 2.1    Monocytes Relative % 6 7 7 6 7     Monocytes Absolute 0.1 - 1.0 K/uL 0.6 0.7 0.8 0.7 0.6    Eosinophils Relative % 3 2 4 8 8     Eosinophils Absolute 0.0 - 0.5 K/uL 0.3 0.2 0.4 0.9 High  0.7 High     Basophils Relative % 1 1 1 1 1      Basophils Absolute 0.0 - 0.1 K/uL 0.1 0.1 0.1 0.1 0.1    Immature Granulocytes % 0 0 1 1 1     Abs Immature Granulocytes 0.00 - 0.07 K/uL 0.03 0.02 CM 0.06 CM 0.09 High  CM 0.05 CM    Comment: Performed at [facility], [address] [place], [place] [address]  Resulting Agency [facility] CLIN LAB [place] [facility] [place] [facility] [place] [facility] [place] [facility] [place] [facility] [place] [facility]         Specimen Collected: 03/30/22 15:24 Last Resulted: 03/30/22 15:32       Component Ref Range & Units 1 d ago (03/30/22) 2 mo ago (01/28/22) 3 mo ago (12/15/21) 4 mo ago (12/01/21) 4 mo ago (11/27/21) 4 mo ago (11/26/21) 4 mo ago (11/25/21)  Sodium 135 - 145 mmol/L 131 Low  134 Low  136 138 141  140 140  Potassium 3.5 - 5.1 mmol/L 4.1 3.8 3.7 3.5 3.0 Low  3.5 3.6  Chloride 98 - 111 mmol/L 100 100 100 106 108 108 106  CO2 22 - 32 mmol/L 25 25 24 23 24 24 23   Glucose, Bld 70 - 99 mg/dL 107 High  141 High  CM 127 High  CM 125 High  CM 89 CM 99 CM 130 High  CM  Comment: Glucose reference range applies only to samples taken after fasting for at least 8 hours.  BUN 8 - 23 mg/dL 25 High  15 21 10 10 12 12   Creatinine 0.61 - 1.24 mg/dL 0.76 0.75 0.84 0.90 0.81 1.03 0.94  Calcium 8.9 - 10.3 mg/dL 9.0 8.9 9.9 9.3 8.8 Low  8.5 Low  9.2  Total Protein 6.5 - 8.1 g/dL 8.4 High  7.9 8.1 7.6 6.3 Low     Albumin 3.5 - 5.0 g/dL 3.3 Low  3.2 Low  4.0 3.1 Low  2.4 Low     AST 15 - 41 U/L 39 33 35 73 High  24    ALT 0 - 44 U/L 27 28 31  47 High  20    Alkaline Phosphatase 38 - 126 U/L 84 98 67 86 72    Total Bilirubin 0.3 - 1.2 mg/dL 0.6 0.3 1.2 0.4 0.7    GFR, Estimated >60 mL/min >60        Comment: (NOTE) Calculated using the CKD-EPI Creatinine Equation (2021)  Anion gap 5 - 15 6 9  CM 12 CM 9 CM 9 CM 8 CM 11 CM  Comment: Performed at [facility], [address] [place], [place] [address]  Resulting Agency [facility] CL[facility] LAB [place] CL[facility] [place] [facility] [place] [facility] [place] [facility] [place] [facility] [place] [facility]          Specimen Collected: 03/30/22 15:24 Last Resulted: 03/30/22 15:43

## 2022-04-01 ENCOUNTER — Telehealth: Payer: Self-pay | Admitting: *Deleted

## 2022-04-01 ENCOUNTER — Encounter: Payer: Self-pay | Admitting: Oncology

## 2022-04-01 NOTE — Telephone Encounter (Signed)
Fax confirmation received to [facility]

## 2022-04-01 NOTE — Telephone Encounter (Signed)
Wife called again regarding lab results and feels that patient needs to have IV fluids and something done for his anemia and subsequent weakness. Please advise

## 2022-04-02 ENCOUNTER — Inpatient Hospital Stay: Payer: Medicare HMO

## 2022-04-05 ENCOUNTER — Telehealth: Payer: Self-pay | Admitting: *Deleted

## 2022-04-05 NOTE — Telephone Encounter (Addendum)
Massie Maroon called reporting that patient does not need Physical Therapist, she needs someone in the home to help her with him getting him up and to appointments etc. She is asking for  a return call

## 2022-04-05 NOTE — Telephone Encounter (Signed)
Had a long conversation with patient's wife by phone.  Patient is declining and is now essentially chair/bedbound.  Appetite remains poor.  Discussed that most probably his overall decline is secondary to his terminal cancer and explained that patient could be nearing end-of-life.  Wife has historically struggled with these conversations, refusing to believe that patient's cancer was incurable.  Today, she seemed more receptive to discussing this possibility.  Patient is scheduled for CTs this week but it sounds like she cannot physically get him to the hospital or clinic.  She discussed the option of calling 911 and transporting him to the emergency department.  However, we also discussed the option of keeping him comfortable and involving hospice to facilitate his care at home.  I again strongly recommended DNR/DNI.  Patient's wife said that she had to get off the phone and consider how to proceed.

## 2022-04-05 NOTE — Telephone Encounter (Signed)
Can you look into this?

## 2022-04-06 ENCOUNTER — Ambulatory Visit: Admission: RE | Admit: 2022-04-06 | Payer: Medicare HMO | Source: Ambulatory Visit

## 2022-04-06 ENCOUNTER — Encounter: Payer: Self-pay | Admitting: Oncology

## 2022-04-07 ENCOUNTER — Telehealth: Payer: Self-pay | Admitting: *Deleted

## 2022-04-07 NOTE — Telephone Encounter (Signed)
Maurice Simpson left a vm for patient's wife to return my phone call. Maurice Simpson would like to set up a telephone office encounter either this afternoon or tomorrow to re discuss goals of care.

## 2022-04-07 NOTE — Telephone Encounter (Signed)
Note: Patient no showed for Brain MRI yesterday.  This afternoon, I received a fax message from [facility]  "Wife faye reprots that patient will not be able to perform PT as he is currently bed bound, unable to transfer himself, unable to stand or walk due to pain and weakness. That she called his pcp to get more people to provide assistance as she can't take care of him at home by herself. He needs transportation to his medical appointments. Pt will need placement to SNF/Rehab or palliative care at home."  Josh- do you want me to arrange for a f/u telephone call- for pal. Care?

## 2022-04-08 ENCOUNTER — Encounter: Payer: Self-pay | Admitting: Family Medicine

## 2022-04-08 ENCOUNTER — Emergency Department: Payer: Medicare HMO

## 2022-04-08 ENCOUNTER — Encounter: Payer: Self-pay | Admitting: Oncology

## 2022-04-08 ENCOUNTER — Other Ambulatory Visit: Payer: Self-pay

## 2022-04-08 ENCOUNTER — Inpatient Hospital Stay
Admission: EM | Admit: 2022-04-08 | Discharge: 2022-04-10 | DRG: 194 | Disposition: A | Discharge status: Home Health | Payer: Medicare HMO | Attending: Internal Medicine | Admitting: Internal Medicine

## 2022-04-08 ENCOUNTER — Ambulatory Visit: Payer: Medicare HMO

## 2022-04-08 DIAGNOSIS — I5A Non-ischemic myocardial injury (non-traumatic): Secondary | ICD-10-CM | POA: Diagnosis not present

## 2022-04-08 DIAGNOSIS — Z5982 Transportation insecurity: Secondary | ICD-10-CM

## 2022-04-08 DIAGNOSIS — E871 Hypo-osmolality and hyponatremia: Secondary | ICD-10-CM | POA: Diagnosis not present

## 2022-04-08 DIAGNOSIS — J181 Lobar pneumonia, unspecified organism: Secondary | ICD-10-CM | POA: Diagnosis not present

## 2022-04-08 DIAGNOSIS — C7931 Secondary malignant neoplasm of brain: Secondary | ICD-10-CM | POA: Diagnosis present

## 2022-04-08 DIAGNOSIS — R0689 Other abnormalities of breathing: Secondary | ICD-10-CM | POA: Diagnosis not present

## 2022-04-08 DIAGNOSIS — R531 Weakness: Secondary | ICD-10-CM | POA: Diagnosis not present

## 2022-04-08 DIAGNOSIS — R778 Other specified abnormalities of plasma proteins: Secondary | ICD-10-CM | POA: Diagnosis not present

## 2022-04-08 DIAGNOSIS — C3490 Malignant neoplasm of unspecified part of unspecified bronchus or lung: Secondary | ICD-10-CM

## 2022-04-08 DIAGNOSIS — R627 Adult failure to thrive: Secondary | ICD-10-CM | POA: Diagnosis present

## 2022-04-08 DIAGNOSIS — R000 Tachycardia, unspecified: Secondary | ICD-10-CM | POA: Diagnosis not present

## 2022-04-08 DIAGNOSIS — R5381 Other malaise: Secondary | ICD-10-CM | POA: Diagnosis not present

## 2022-04-08 DIAGNOSIS — C786 Secondary malignant neoplasm of retroperitoneum and peritoneum: Secondary | ICD-10-CM | POA: Diagnosis present

## 2022-04-08 DIAGNOSIS — Z6822 Body mass index (BMI) 22.0-22.9, adult: Secondary | ICD-10-CM | POA: Diagnosis not present

## 2022-04-08 DIAGNOSIS — Z87891 Personal history of nicotine dependence: Secondary | ICD-10-CM | POA: Diagnosis not present

## 2022-04-08 DIAGNOSIS — J90 Pleural effusion, not elsewhere classified: Secondary | ICD-10-CM | POA: Diagnosis not present

## 2022-04-08 DIAGNOSIS — E440 Moderate protein-calorie malnutrition: Secondary | ICD-10-CM | POA: Diagnosis present

## 2022-04-08 DIAGNOSIS — C772 Secondary and unspecified malignant neoplasm of intra-abdominal lymph nodes: Secondary | ICD-10-CM | POA: Diagnosis present

## 2022-04-08 DIAGNOSIS — R64 Cachexia: Secondary | ICD-10-CM | POA: Diagnosis not present

## 2022-04-08 DIAGNOSIS — Z993 Dependence on wheelchair: Secondary | ICD-10-CM

## 2022-04-08 DIAGNOSIS — C7971 Secondary malignant neoplasm of right adrenal gland: Secondary | ICD-10-CM | POA: Diagnosis not present

## 2022-04-08 DIAGNOSIS — D638 Anemia in other chronic diseases classified elsewhere: Secondary | ICD-10-CM | POA: Diagnosis not present

## 2022-04-08 DIAGNOSIS — R7401 Elevation of levels of liver transaminase levels: Secondary | ICD-10-CM | POA: Diagnosis not present

## 2022-04-08 DIAGNOSIS — J440 Chronic obstructive pulmonary disease with acute lower respiratory infection: Secondary | ICD-10-CM | POA: Diagnosis present

## 2022-04-08 DIAGNOSIS — R7989 Other specified abnormal findings of blood chemistry: Secondary | ICD-10-CM | POA: Diagnosis present

## 2022-04-08 DIAGNOSIS — Z79899 Other long term (current) drug therapy: Secondary | ICD-10-CM

## 2022-04-08 DIAGNOSIS — Z743 Need for continuous supervision: Secondary | ICD-10-CM | POA: Diagnosis not present

## 2022-04-08 DIAGNOSIS — R918 Other nonspecific abnormal finding of lung field: Principal | ICD-10-CM

## 2022-04-08 DIAGNOSIS — Z515 Encounter for palliative care: Secondary | ICD-10-CM

## 2022-04-08 DIAGNOSIS — K760 Fatty (change of) liver, not elsewhere classified: Secondary | ICD-10-CM | POA: Diagnosis not present

## 2022-04-08 DIAGNOSIS — L89151 Pressure ulcer of sacral region, stage 1: Secondary | ICD-10-CM | POA: Diagnosis present

## 2022-04-08 DIAGNOSIS — C771 Secondary and unspecified malignant neoplasm of intrathoracic lymph nodes: Secondary | ICD-10-CM | POA: Diagnosis not present

## 2022-04-08 DIAGNOSIS — C7972 Secondary malignant neoplasm of left adrenal gland: Secondary | ICD-10-CM | POA: Diagnosis not present

## 2022-04-08 DIAGNOSIS — M549 Dorsalgia, unspecified: Secondary | ICD-10-CM | POA: Diagnosis present

## 2022-04-08 DIAGNOSIS — J811 Chronic pulmonary edema: Secondary | ICD-10-CM | POA: Diagnosis not present

## 2022-04-08 DIAGNOSIS — R296 Repeated falls: Secondary | ICD-10-CM | POA: Diagnosis present

## 2022-04-08 DIAGNOSIS — L8990 Pressure ulcer of unspecified site, unspecified stage: Secondary | ICD-10-CM | POA: Insufficient documentation

## 2022-04-08 DIAGNOSIS — Z5941 Food insecurity: Secondary | ICD-10-CM

## 2022-04-08 DIAGNOSIS — C3401 Malignant neoplasm of right main bronchus: Secondary | ICD-10-CM | POA: Diagnosis not present

## 2022-04-08 DIAGNOSIS — Z7401 Bed confinement status: Secondary | ICD-10-CM

## 2022-04-08 DIAGNOSIS — Z91030 Bee allergy status: Secondary | ICD-10-CM

## 2022-04-08 DIAGNOSIS — Z7951 Long term (current) use of inhaled steroids: Secondary | ICD-10-CM

## 2022-04-08 DIAGNOSIS — R0902 Hypoxemia: Secondary | ICD-10-CM | POA: Diagnosis not present

## 2022-04-08 DIAGNOSIS — R079 Chest pain, unspecified: Secondary | ICD-10-CM | POA: Diagnosis not present

## 2022-04-08 DIAGNOSIS — Z9221 Personal history of antineoplastic chemotherapy: Secondary | ICD-10-CM

## 2022-04-08 DIAGNOSIS — J449 Chronic obstructive pulmonary disease, unspecified: Secondary | ICD-10-CM | POA: Diagnosis not present

## 2022-04-08 DIAGNOSIS — R0789 Other chest pain: Secondary | ICD-10-CM | POA: Diagnosis not present

## 2022-04-08 LAB — URINALYSIS, ROUTINE W REFLEX MICROSCOPIC
Bacteria, UA: NONE SEEN
Bilirubin Urine: NEGATIVE
Glucose, UA: NEGATIVE mg/dL
Hgb urine dipstick: NEGATIVE
Ketones, ur: NEGATIVE mg/dL
Leukocytes,Ua: NEGATIVE
Nitrite: NEGATIVE
Protein, ur: 30 mg/dL — AB
Specific Gravity, Urine: 1.025 (ref 1.005–1.030)
Squamous Epithelial / HPF: NONE SEEN /HPF (ref 0–5)
pH: 5 (ref 5.0–8.0)

## 2022-04-08 LAB — TROPONIN I (HIGH SENSITIVITY)
Troponin I (High Sensitivity): 27 ng/L — ABNORMAL HIGH (ref <18)
Troponin I (High Sensitivity): 21 ng/L — ABNORMAL HIGH (ref <18)
Troponin I (High Sensitivity): 17 ng/L (ref <18)

## 2022-04-08 LAB — BASIC METABOLIC PANEL
Anion gap: 12 (ref 5–15)
BUN: 29 mg/dL — ABNORMAL HIGH (ref 8–23)
CO2: 26 mmol/L (ref 22–32)
Calcium: 9.2 mg/dL (ref 8.9–10.3)
Chloride: 96 mmol/L — ABNORMAL LOW (ref 98–111)
Creatinine, Ser: 0.85 mg/dL (ref 0.61–1.24)
GFR, Estimated: >60 mL/min (ref >60)
Glucose, Bld: 119 mg/dL — ABNORMAL HIGH (ref 70–99)
Potassium: 3.8 mmol/L (ref 3.5–5.1)
Sodium: 134 mmol/L — ABNORMAL LOW (ref 135–145)

## 2022-04-08 LAB — CBC
HCT: 35.9 % — ABNORMAL LOW (ref 39.0–52.0)
HCT: 30.2 % — ABNORMAL LOW (ref 39.0–52.0)
Hemoglobin: 11.6 g/dL — ABNORMAL LOW (ref 13.0–17.0)
Hemoglobin: 9.8 g/dL — ABNORMAL LOW (ref 13.0–17.0)
MCH: 30 pg (ref 26.0–34.0)
MCH: 30.4 pg (ref 26.0–34.0)
MCHC: 32.3 g/dL (ref 30.0–36.0)
MCHC: 32.5 g/dL (ref 30.0–36.0)
MCV: 92.8 fL (ref 80.0–100.0)
MCV: 93.8 fL (ref 80.0–100.0)
Platelets: 235 10*3/uL (ref 150–400)
Platelets: 194 10*3/uL (ref 150–400)
RBC: 3.87 MIL/uL — ABNORMAL LOW (ref 4.22–5.81)
RBC: 3.22 MIL/uL — ABNORMAL LOW (ref 4.22–5.81)
RDW: 14.5 % (ref 11.5–15.5)
RDW: 14.2 % (ref 11.5–15.5)
WBC: 13.8 10*3/uL — ABNORMAL HIGH (ref 4.0–10.5)
WBC: 14.6 10*3/uL — ABNORMAL HIGH (ref 4.0–10.5)
nRBC: 0 % (ref 0.0–0.2)
nRBC: 0 % (ref 0.0–0.2)

## 2022-04-08 MED ORDER — SODIUM CHLORIDE 0.9 % IV SOLN
2.0000 g | INTRAVENOUS | Status: DC
  Administered 2022-04-08 – 2022-04-09 (×2): 2 g via INTRAVENOUS
  Filled 2022-04-08 (×3): qty 20

## 2022-04-08 MED ORDER — IOHEXOL 300 MG/ML  SOLN
100.0000 mL | Freq: Once | INTRAMUSCULAR | Status: AC | PRN
  Administered 2022-04-08: 100 mL via INTRAVENOUS

## 2022-04-08 MED ORDER — ENOXAPARIN SODIUM 40 MG/0.4ML IJ SOSY
40.0000 mg | PREFILLED_SYRINGE | INTRAMUSCULAR | Status: DC
  Administered 2022-04-08 – 2022-04-09 (×2): 40 mg via SUBCUTANEOUS
  Filled 2022-04-08 (×2): qty 0.4

## 2022-04-08 MED ORDER — SODIUM CHLORIDE 0.9 % IV SOLN
500.0000 mg | INTRAVENOUS | Status: DC
  Administered 2022-04-08 – 2022-04-09 (×2): 500 mg via INTRAVENOUS
  Filled 2022-04-08: qty 500
  Filled 2022-04-08 (×2): qty 5

## 2022-04-08 MED ORDER — SODIUM CHLORIDE 0.9 % IV BOLUS
500.0000 mL | Freq: Once | INTRAVENOUS | Status: AC
  Administered 2022-04-08: 500 mL via INTRAVENOUS

## 2022-04-08 MED ORDER — ONDANSETRON HCL 4 MG/2ML IJ SOLN: 4.0000 mg | Freq: Four times a day (QID) | INTRAMUSCULAR | Status: DC | PRN

## 2022-04-08 MED ORDER — ENSURE MAX PROTEIN PO LIQD
11.0000 [oz_av] | Freq: Two times a day (BID) | ORAL | Status: DC
  Administered 2022-04-08: 11 [oz_av] via ORAL

## 2022-04-08 MED ORDER — ONDANSETRON HCL 4 MG PO TABS: 4.0000 mg | ORAL_TABLET | Freq: Four times a day (QID) | ORAL | Status: DC | PRN

## 2022-04-08 NOTE — ED Notes (Signed)
Right chest port accessed with 20ga x .75 needle.

## 2022-04-08 NOTE — ED Triage Notes (Signed)
Pt to ED [facility] from home for multiple complaints. Has stage small cell lung cancer, has finished chemo and radiation. Wife reports here possible dehydration, generalized weakness and chest pain when pt coughs or moves.  Pt in NAD, RR even and unlabored.  Wife reports this has been progressing over the last week

## 2022-04-08 NOTE — Assessment & Plan Note (Addendum)
Progressive worsening weakness in the setting of metastatic lung disease CT imaging of the chest with significant further progression of metastatic lung disease Noted admission with similar issues back in November of last year with consultation by palliative care Noted prior discussions about hospice versus palliative interventions in setting of terminal cancer no longer on active treatment  Noted cough with mild white count of 14 and postobstructive changes on CT of the chest with progression of baseline lung cancer- will cover empirically for PNA w/ Rocephin and Azithromycin  De-escalate as appropriate.  Palliative care consulted  Wife still wishes to be full code Pending MRI brain and CT A&P to further assess tumor burden  Wife is asking for home resources  Will place consult for social work

## 2022-04-08 NOTE — H&P (Signed)
History and Physical    Patient: Maurice Simpson [id number] DOB: February 22, 1955 DOA: 04/08/2022 DOS: the patient was seen and examined on 04/08/2022 PCP: Sindy Guadeloupe, MD  Patient coming from: Home  Chief Complaint:  Chief Complaint  Patient presents with   Weakness   HPI: Maurice Simpson is a 67 y.o. 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  Patient with noted baseline metastatic lung cancer.  No longer on treatment as of November last year.  Per the wife, patient overall worsening weakness with significant difficulty with ambulation as well as overall decreased p.o. intake.  Noted admission back in November for similar symptoms.  Was treated for lobar pneumonia at that point as well as formal evaluation with palliative care.  Wife declined palliative intervention at that time.  Patient has had overall decline since this point.  Mild cough.  No reported shortness of breath.  No reported vomiting or diarrhea. Presented to the ER afebrile, hemodynamically stable.  White count 13.8, hemoglobin 11.6.  Troponin 20s.  EKG stable.  Initial chest x-ray with right base consolidation and moderate effusion.  CT chest with increased right hilar lung mass with occlusion of the adjacent bronchus withdrawal and appearance of the right lower lobe as well as more invasion of the right hilum and increasing lymph nodes in the mediastinum upper abdomen and retroperitoneum as well as development of pancreatic metastatic lesions.  Also with increasing adrenal masses in size and number.  Increase in extent and distribution of peritoneal metastatic disease.  Palliative care also consulted at the bedside. Dr. Janese Banks w/ oncology also aware of case.  Review of Systems: As mentioned in the history of present illness. All other systems reviewed and are negative. Past Medical History:  Diagnosis Date    [facility])    COPD (chronic obstructive pulmonary disease) ([place])    Dyspnea    Past Surgical History:  Procedure Laterality Date   CATARACT EXTRACTION Right 03/10/2021   IR IMAGING GUIDED PORT INSERTION  07/27/2021   Social History:  reports that he quit smoking about 5 months ago. His smoking use included cigarettes. He has a 102.00 pack-year smoking history. He has never used smokeless tobacco. He reports that he does not currently use alcohol. He reports that he does not use drugs.  Allergies  Allergen Reactions   Bee Venom Anaphylaxis    Family History  Problem Relation Age of Onset   Diabetes Mother    Hypertension Mother    Diabetes Father    Hypertension Father    Diabetes Sister    Cancer - Colon Cousin     Prior to Admission medications   Medication Sig Start Date End Date Taking? Authorizing Provider  acetaminophen (TYLENOL) 325 MG tablet Take 2 tablets (650 mg total) by mouth every 6 (six) hours as needed for mild pain, moderate pain, fever or headache. 11/27/21  Yes Nicole Kindred A, DO  budesonide (PULMICORT) 0.25 MG/2ML nebulizer solution Take 2 mLs (0.25 mg total) by nebulization 2 (two) times daily as needed. 08/03/21  Yes Sindy Guadeloupe, MD  Docusate Sodium (COLACE PO) Take 1 capsule by mouth as needed (constipation).   Yes [provider]  ibuprofen (ADVIL) 200 MG tablet Take 200 mg by mouth every 6 (six) hours as needed.   Yes [provider]  lidocaine-prilocaine (EMLA) cream Apply 1 Application topically as needed. Apply  to [facility] a cath prior to access 10/12/21  Yes [provider]  loperamide (IMODIUM) 2 MG capsule Take 2 mg by mouth as needed for diarrhea or loose stools.   Yes [provider]  nystatin (MYCOSTATIN) 100000 UNIT/ML suspension Take 5 mLs (500,000 Units total) by mouth 3 (three) times daily. 11/10/21  Yes Borders, Kirt Boys, NP  ondansetron (ZOFRAN) 4 MG tablet Take 1 tablet (4 mg total) by mouth every 8 (eight)  hours as needed for nausea or vomiting. 10/12/21  Yes Sindy Guadeloupe, MD  oxyCODONE (OXY IR/ROXICODONE) 5 MG immediate release tablet Take 1 tablet (5 mg total) by mouth every 4 (four) hours as needed for severe pain. 03/30/22  Yes Borders, Kirt Boys, NP  prochlorperazine (COMPAZINE) 10 MG tablet Take 1 tablet (10 mg total) by mouth every 6 (six) hours as needed for nausea or vomiting. 07/01/21  Yes Sindy Guadeloupe, MD  albuterol (PROVENTIL) (2.5 MG/3ML) 0.083% nebulizer solution Take 3 mLs (2.5 mg total) by nebulization every 6 (six) hours as needed for wheezing or shortness of breath. 08/03/21   Sindy Guadeloupe, MD  feeding supplement (ENSURE ENLIVE / ENSURE PLUS) LIQD Take 237 mLs by mouth 4 (four) times daily. 11/27/21   Nicole Kindred A, DO  pantoprazole (PROTONIX) 20 MG tablet TAKE 1 TABLET BY MOUTH EVERY DAY Patient not taking: Reported on 03/30/2022 08/05/21   Sindy Guadeloupe, MD  potassium chloride SA (KLOR-CON M) 20 MEQ tablet Take 20 mEq by mouth 2 (two) times daily. Patient not taking: Reported on 03/30/2022    [provider]    Physical Exam: Vitals:   04/08/22 1500 04/08/22 1630 04/08/22 1700 04/08/22 1727  BP: 99/85   104/66  Pulse: 87 86 82 87  Resp: 16 17 15 18   Temp: 98 F (36.7 C)   97.7 F (36.5 C)  TempSrc: Oral   Oral  SpO2: 100% 95% 94% 95%  Weight:      Height:       Physical Exam Constitutional:      General: He is not in acute distress.    Appearance: He is normal weight.     Comments: Cachectic   HENT:     Mouth/Throat:     Mouth: Mucous membranes are dry.  Cardiovascular:     Rate and Rhythm: Normal rate and regular rhythm.  Pulmonary:     Effort: Pulmonary effort is normal.  Abdominal:     General: Bowel sounds are normal.  Musculoskeletal:     Comments: + generalized weakness    Skin:    General: Skin is dry.  Neurological:     General: No focal deficit present.  Psychiatric:        Mood and Affect: Mood normal.     Data  Reviewed:  There are no new results to review at this time. CT CHEST ABDOMEN PELVIS W CONTRAST CLINICAL DATA:  Lung cancer. Weakness. Brain neoplasm. * Tracking Code: BO *  EXAM: CT CHEST, ABDOMEN, AND PELVIS WITH CONTRAST  TECHNIQUE: Multidetector CT imaging of the chest, abdomen and pelvis was performed following the standard protocol during bolus administration of intravenous contrast.  RADIATION DOSE REDUCTION: This exam was performed according to the departmental dose-optimization program which includes automated exposure control, adjustment of the mA and/or kV according to patient size and/or use of iterative reconstruction technique.  CONTRAST:  153mL OMNIPAQUE IOHEXOL 300 MG/ML  SOLN  COMPARISON:  Chest CT scan 11/25/2021 and older. Chest abdomen pelvis  CT 09/18/2021.  FINDINGS: CT CHEST FINDINGS  Cardiovascular: Right upper chest port. The port is accessed. No significant pericardial effusion. The heart is nonenlarged. Coronary artery calcifications are seen. The thoracic aorta has a normal course and caliber with mild atherosclerotic calcified plaque. Pulsation artifact along the ascending aorta.  Mediastinum/Nodes: Preserved thyroid gland. Normal caliber thoracic esophagus.  No specific abnormal lymph node enlargement seen in the axillary region or left hilum. Enlarged lymph nodes seen which is new posterior to the carina right of midline on series 2, image 23 measuring 15 by 12 mm. There are some additional small nodes along the lower posterior mediastinum to the left of the esophagus which are not pathologic but slightly larger today as well.  Lungs/Pleura: Breathing motion. The left lung has some reticular changes. No consolidation or pneumothorax. Underlying centrilobular emphysematous changes bilaterally.  There is a new tiny right effusion. The previously seen mass centered in the right lung hilum inferiorly has significantly increased in size. There  is more extension into the hilum and right side of the mediastinum. There is occlusion of the bronchus intermedius and right lower lobe bronchus with drowned lung appearance peripherally in the right lower lobe with volume loss. There is also poor visualization of the associated right-sided pulmonary veins. Previous dimension of the mass in the hilum had measured 3.1 x 3.3 cm previously. Today the mass lesion would have dimensions approaching 7.5 by 6.3 cm. There are increasing reticular changes as well in the inferior aspect of the middle lobe. There has likely a component of abnormal nodes in the hilum which is confluence with a mass.  Musculoskeletal: Scattered degenerative changes of the spine. Gynecomastia.  CT ABDOMEN PELVIS FINDINGS  Hepatobiliary: Fatty liver infiltration. No space-occupying liver lesion. Patent portal vein. Gallstone in the nondilated gallbladder.  Pancreas: There is new mass along the pancreatic head measuring on series 2, image 67 at 3.9 by 2.7 cm. There is also a lesion in the tail of the pancreas at the splenic hilum measuring 2.4 by 2.3 cm. Also likely third smaller lesion on image 62 of series 2.  Spleen: Normal in size without focal abnormality.  Adrenals/Urinary Tract: Increasing bilateral adrenal masses. Right-sided lesion previously measured 5.7 x 4.3 cm and today on series 2, image 62 6.3 by 4.6 cm. Left-sided lesion measured 3.5 x 2.9 cm previously and today on series 2, image 61 5.8 by 4.9 cm. Second lesion just caudal along the adrenal gland is now seen as well.  No enhancing renal mass or collecting system dilatation. The ureters have normal course and caliber down to the bladder. Preserved contours of the urinary bladder with a left-sided bladder diverticula.  Stomach/Bowel: The large bowel is nondilated. Diffuse colonic stool. Significant stool along the right side of the colon. Normal appendix. Stomach is nondilated. The small bowel  itself is nondilated.  Vascular/Lymphatic: Normal caliber aorta and IVC with scattered vascular calcifications. However there are developing abnormal lymph nodes identified in the abdomen. This includes periceliac location with lymph node measuring 5.1 by 3.9 cm. Series 2, image 60. Left para-aortic nodes. Example series 2, image 72 measuring 2.5 2.3 cm. Least 2 adjacent nodes are seen as well in addition.  Reproductive: Prostate is unremarkable.  Other: Mesenteric metastatic disease is also again seen as on previous but significantly progressed. The lesion in the anterior mesentery which measured 4.9 x 2.6 cm on the previous examination, today is a slightly different location and likely related to tethering with the adjacent wall  of the transverse colon and is measured on series 2, image 82 at 5.9 by 6.2 cm. The right-sided mid mesentery lesion which had measured 3.1 x 1.9 cm, today is seen on image 97 measuring 4.9 by 3.9 cm. There is a lesion retrorectal in the low pelvis which is new on series 2, image 116 measuring 3.7 by 2.6 cm. Slight mass effect along the adjacent bowel. Trace ascites identified. No free air.  Musculoskeletal: Slight curvature of the lumbar spine. Scattered degenerative changes of the spine and pelvis.  IMPRESSION: Overall significant interval progression of disease.  Increased size of the right hilar lung mass with occlusion of the adjacent bronchus, drowned lung appearance of the right lower lobe. This also more invasion of the right hilum with poor visualization of the right-sided pulmonary veins.  Increasing lymph nodes in the mediastinum, upper abdomen and retroperitoneum.  Development of pancreatic metastatic lesions.  Increasing bilateral adrenal masses in size and number.  Increasing extent and distribution of peritoneal metastatic disease.  Electronically Signed   By: Jill Side M.D.   On: 04/08/2022 14:51 DG Chest Port 1  View CLINICAL DATA:  Chest pain  EXAM: PORTABLE CHEST 1 VIEW  COMPARISON:  11/25/2021  FINDINGS: Moderate right-sided pleural effusion and right basilar volume loss or consolidation. Pulmonary vascular congestion. No pneumothorax. Right-sided Port-A-Cath tip overlies RA/SVC junction.  IMPRESSION: Right base consolidation and moderate effusion. Pulmonary vascular congestion.  Electronically Signed   By: Sammie Bench M.D.   On: 04/08/2022 12:45  Lab Results  Component Value Date   WBC 14.6 (H) 04/08/2022   HGB 9.8 (L) 04/08/2022   HCT 30.2 (L) 04/08/2022   MCV 93.8 04/08/2022   PLT 194 0000000   Last metabolic panel Lab Results  Component Value Date   GLUCOSE 119 (H) 04/08/2022   NA 134 (L) 04/08/2022   K 3.8 04/08/2022   CL 96 (L) 04/08/2022   CO2 26 04/08/2022   BUN 29 (H) 04/08/2022   CREATININE 0.85 04/08/2022   GFRNONAA >60 04/08/2022   CALCIUM 9.2 04/08/2022   PHOS 3.2 08/14/2021   PROT 8.4 (H) 03/30/2022   ALBUMIN 3.3 (L) 03/30/2022   LABGLOB 2.9 04/30/2021   AGRATIO 1.5 04/30/2021   BILITOT 0.6 03/30/2022   ALKPHOS 84 03/30/2022   AST 39 03/30/2022   ALT 27 03/30/2022   ANIONGAP 12 04/08/2022    Assessment and Plan: * Weakness Progressive worsening weakness in the setting of metastatic lung disease CT imaging of the chest with significant further progression of metastatic lung disease Noted admission with similar issues back in November of last year with consultation by palliative care Noted prior discussions about hospice versus palliative interventions in setting of terminal cancer no longer on active treatment  Noted cough with mild white count of 14 and postobstructive changes on CT of the chest with progression of baseline lung cancer- will cover empirically for PNA w/ Rocephin and Azithromycin  De-escalate as appropriate.  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    Palliative care encounter Palliative care consulted  Appreciate input  Wife wishes pt to be full code        Advance Care Planning:   Code Status: Full Code   Consults: Palliative Care, Hem onc w/ Dr. Janese Banks per EDP   Family Communication: Wife at the bedside   Severity of Illness: The appropriate patient status for this patient is OBSERVATION. Observation status is judged to be reasonable and necessary in order to provide the required intensity of service to ensure the patient's safety. The patient's presenting symptoms, physical exam findings, and initial radiographic and laboratory data in the context of their medical condition is felt to place them at decreased risk for further clinical deterioration. Furthermore, it is anticipated that the patient will be medically stable for discharge from the hospital within 2 midnights of admission.   Author: Deneise Lever, MD 04/08/2022 6:50 PM  For on call review [email or web address].

## 2022-04-08 NOTE — ED Notes (Signed)
Transport requested

## 2022-04-08 NOTE — Assessment & Plan Note (Signed)
Fairly stable from a resp standpoint  Cont home inhalers

## 2022-04-08 NOTE — Assessment & Plan Note (Signed)
Trop in 20s on presentation  No active CP EKG stable  Likely mild heart strain in setting of metastatic lung cancer  Follow

## 2022-04-08 NOTE — ED Notes (Signed)
First nurse note:  Pt here via [facility] due to weakness and states this has been ongoing for 1 week.   96% RA 126/73   2 albuterol TX 125 mg solumedrol 20G L Ac

## 2022-04-08 NOTE — ED Notes (Signed)
Pt took IV out, bleeding controlled, pt states "I didn't know what was going on"

## 2022-04-08 NOTE — Consult Note (Signed)
[place] at [facility] [phone] [phone]   Name: Maurice Simpson Date: 04/08/2022 MRN: [id number]  DOB: [birth year]  Patient Care Team: Sindy Guadeloupe, MD as PCP - General (Oncology) Telford Nab, RN as Oncology Nurse Navigator Leim Fabry, MD as Consulting Physician (Orthopedic Surgery)    REASON FOR CONSULTATION: Maurice Simpson is a 67 y.o. 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  Patient has been off treatment with Tecentriq since November 2023 due to poor tolerance.  Patient has had overall decline in performance status with poor oral intake.  He has no showed or canceled multiple attempts at repeat scans.  Patient presented to the hospital on 04/08/2022 with worsening weakness.  Palliative care was consulted to address goals.  SOCIAL HISTORY:     reports that he quit smoking about 5 months ago. His smoking use included cigarettes. He has a 102.00 pack-year smoking history. He has never used smokeless tobacco. He reports that he does not currently use alcohol. He reports that he does not use drugs.  Patient is married and lives at home with his wife.  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  He has several biological children from a previous marriage.  ADVANCE DIRECTIVES:  Not on file  CODE STATUS: Full code  PAST MEDICAL HISTORY: Past Medical History:  Diagnosis Date   Cancer ([place])    COPD (chronic obstructive pulmonary disease) ([place])    Dyspnea     PAST SURGICAL HISTORY:  Past Surgical History:  Procedure Laterality Date   CATARACT EXTRACTION Right 03/10/2021   IR IMAGING GUIDED PORT INSERTION  07/27/2021    HEMATOLOGY/ONCOLOGY HISTORY:  Oncology History  Small cell lung cancer ([place])  07/21/2021 Initial Diagnosis   Small cell lung cancer ([place])   07/21/2021 Cancer Staging    Staging form: Lung, AJCC 8th Edition - Clinical stage from 07/21/2021: Stage IVB (cT2, cN0, pM1c) - Signed by Sindy Guadeloupe, MD on 07/21/2021   08/03/2021 - 09/01/2021 Chemotherapy   Patient is on Treatment Plan : LUNG SCLC Carboplatin + Etoposide + Atezolizumab Induction q21d / Atezolizumab Maintenance q21d     09/01/2021 -  Chemotherapy   Patient is on Treatment Plan : LUNG SCLC Carboplatin + Etoposide + Atezolizumab Induction q21d x 4 cycles / Atezolizumab Maintenance q21d       ALLERGIES:  is allergic to bee venom.  MEDICATIONS:  Current Facility-Administered Medications  Medication Dose Route Frequency Provider Last Rate Last Admin   enoxaparin (LOVENOX) injection 40 mg  40 mg Subcutaneous Q24H Deneise Lever, MD       ondansetron [facility]) tablet 4 mg  4 mg Oral Q6H PRN Deneise Lever, MD       Or   ondansetron [facility]) injection 4 mg  4 mg Intravenous Q6H PRN Deneise Lever, MD       Current Outpatient Medications  Medication Sig Dispense Refill   acetaminophen (TYLENOL) 325 MG tablet Take 2 tablets (650 mg total) by mouth every 6 (six) hours as needed for mild pain, moderate pain, fever or headache.     budesonide (PULMICORT) 0.25 MG/2ML nebulizer solution Take 2 mLs (0.25 mg total) by nebulization 2 (two) times daily as needed. 60 mL 12   Docusate Sodium (COLACE PO) Take 1 capsule by mouth as needed (constipation).     ibuprofen (ADVIL) 200 MG  tablet Take 200 mg by mouth every 6 (six) hours as needed.     lidocaine-prilocaine (EMLA) cream Apply 1 Application topically as needed. Apply to [facility] a cath prior to access     loperamide (IMODIUM) 2 MG capsule Take 2 mg by mouth as needed for diarrhea or loose stools.     nystatin (MYCOSTATIN) 100000 UNIT/ML suspension Take 5 mLs (500,000 Units total) by mouth 3 (three) times daily. 180 mL 0   ondansetron (ZOFRAN) 4 MG tablet Take 1 tablet (4 mg total) by mouth every 8 (eight) hours as needed for nausea or vomiting. 60 tablet 3    oxyCODONE (OXY IR/ROXICODONE) 5 MG immediate release tablet Take 1 tablet (5 mg total) by mouth every 4 (four) hours as needed for severe pain. 60 tablet 0   prochlorperazine (COMPAZINE) 10 MG tablet Take 1 tablet (10 mg total) by mouth every 6 (six) hours as needed for nausea or vomiting. 60 tablet 2   albuterol (PROVENTIL) (2.5 MG/3ML) 0.083% nebulizer solution Take 3 mLs (2.5 mg total) by nebulization every 6 (six) hours as needed for wheezing or shortness of breath. 75 mL 12   feeding supplement (ENSURE ENLIVE / ENSURE PLUS) LIQD Take 237 mLs by mouth 4 (four) times daily. 237 mL 12   pantoprazole (PROTONIX) 20 MG tablet TAKE 1 TABLET BY MOUTH EVERY DAY (Patient not taking: Reported on 03/30/2022) 90 tablet 0   potassium chloride SA (KLOR-CON M) 20 MEQ tablet Take 20 mEq by mouth 2 (two) times daily. (Patient not taking: Reported on 03/30/2022)      VITAL SIGNS: BP 121/67   Pulse 100   Temp 97.9 F (36.6 C)   Resp 18   Ht 5\' 8"  (1.727 m)   Wt 150 lb (68 kg)   SpO2 97%   BMI 22.81 kg/m  Filed Weights   04/08/22 1140  Weight: 150 lb (68 kg)    Estimated body mass index is 22.81 kg/m as calculated from the following:   Height as of this encounter: 5\' 8"  (1.727 m).   Weight as of this encounter: 150 lb (68 kg).  LABS: CBC:    Component Value Date/Time   WBC 13.8 (H) 04/08/2022 1142   HGB 11.6 (L) 04/08/2022 1142   HGB 10.8 (L) 03/30/2022 1524   HGB 18.0 (H) 04/30/2021 1136   HCT 35.9 (L) 04/08/2022 1142   HCT 53.3 (H) 04/30/2021 1136   PLT 235 04/08/2022 1142   PLT 244 03/30/2022 1524   PLT 156 04/30/2021 1136   MCV 92.8 04/08/2022 1142   MCV 91 04/30/2021 1136   MCV 94 01/21/2011 0134   NEUTROABS 5.9 03/30/2022 1524   NEUTROABS 4.8 04/30/2021 1136   LYMPHSABS 2.9 03/30/2022 1524   LYMPHSABS 2.9 04/30/2021 1136   MONOABS 0.6 03/30/2022 1524   EOSABS 0.3 03/30/2022 1524   EOSABS 0.3 04/30/2021 1136   BASOSABS 0.1 03/30/2022 1524   BASOSABS 0.1 04/30/2021 1136    Comprehensive Metabolic Panel:    Component Value Date/Time   NA 134 (L) 04/08/2022 1142   NA 136 04/30/2021 1136   NA 139 01/21/2011 0134   K 3.8 04/08/2022 1142   K 3.8 01/21/2011 0134   CL 96 (L) 04/08/2022 1142   CL 105 01/21/2011 0134   CO2 26 04/08/2022 1142   CO2 23 01/21/2011 0134   BUN 29 (H) 04/08/2022 1142   BUN 12 04/30/2021 1136   BUN 15 01/21/2011 0134   CREATININE 0.85 04/08/2022 1142  CREATININE 0.76 03/30/2022 1524   CREATININE 0.92 01/21/2011 0134   GLUCOSE 119 (H) 04/08/2022 1142   GLUCOSE 118 (H) 01/21/2011 0134   CALCIUM 9.2 04/08/2022 1142   CALCIUM 9.1 01/21/2011 0134   AST 39 03/30/2022 1524   ALT 27 03/30/2022 1524   ALT 29 01/21/2011 0134   ALKPHOS 84 03/30/2022 1524   ALKPHOS 46 (L) 01/21/2011 0134   BILITOT 0.6 03/30/2022 1524   PROT 8.4 (H) 03/30/2022 1524   PROT 7.2 04/30/2021 1136   PROT 7.4 01/21/2011 0134   ALBUMIN 3.3 (L) 03/30/2022 1524   ALBUMIN 4.3 04/30/2021 1136   ALBUMIN 3.8 01/21/2011 0134    RADIOGRAPHIC STUDIES: CT CHEST ABDOMEN PELVIS W CONTRAST  Result Date: 04/08/2022 CLINICAL DATA:  Lung cancer. Weakness. Brain neoplasm. * Tracking Code: BO * EXAM: CT CHEST, ABDOMEN, AND PELVIS WITH CONTRAST TECHNIQUE: Multidetector CT imaging of the chest, abdomen and pelvis was performed following the standard protocol during bolus administration of intravenous contrast. RADIATION DOSE REDUCTION: This exam was performed according to the departmental dose-optimization program which includes automated exposure control, adjustment of the mA and/or kV according to patient size and/or use of iterative reconstruction technique. CONTRAST:  129mL OMNIPAQUE IOHEXOL 300 MG/ML  SOLN COMPARISON:  Chest CT scan 11/25/2021 and older. Chest abdomen pelvis CT 09/18/2021. FINDINGS: CT CHEST FINDINGS Cardiovascular: Right upper chest port. The port is accessed. No significant pericardial effusion. The heart is nonenlarged. Coronary artery calcifications are  seen. The thoracic aorta has a normal course and caliber with mild atherosclerotic calcified plaque. Pulsation artifact along the ascending aorta. Mediastinum/Nodes: Preserved thyroid gland. Normal caliber thoracic esophagus. No specific abnormal lymph node enlargement seen in the axillary region or left hilum. Enlarged lymph nodes seen which is new posterior to the carina right of midline on series 2, image 23 measuring 15 by 12 mm. There are some additional small nodes along the lower posterior mediastinum to the left of the esophagus which are not pathologic but slightly larger today as well. Lungs/Pleura: Breathing motion. The left lung has some reticular changes. No consolidation or pneumothorax. Underlying centrilobular emphysematous changes bilaterally. There is a new tiny right effusion. The previously seen mass centered in the right lung hilum inferiorly has significantly increased in size. There is more extension into the hilum and right side of the mediastinum. There is occlusion of the bronchus intermedius and right lower lobe bronchus with drowned lung appearance peripherally in the right lower lobe with volume loss. There is also poor visualization of the associated right-sided pulmonary veins. Previous dimension of the mass in the hilum had measured 3.1 x 3.3 cm previously. Today the mass lesion would have dimensions approaching 7.5 by 6.3 cm. There are increasing reticular changes as well in the inferior aspect of the middle lobe. There has likely a component of abnormal nodes in the hilum which is confluence with a mass. Musculoskeletal: Scattered degenerative changes of the spine. Gynecomastia. CT ABDOMEN PELVIS FINDINGS Hepatobiliary: Fatty liver infiltration. No space-occupying liver lesion. Patent portal vein. Gallstone in the nondilated gallbladder. Pancreas: There is new mass along the pancreatic head measuring on series 2, image 67 at 3.9 by 2.7 cm. There is also a lesion in the tail of the  pancreas at the splenic hilum measuring 2.4 by 2.3 cm. Also likely third smaller lesion on image 62 of series 2. Spleen: Normal in size without focal abnormality. Adrenals/Urinary Tract: Increasing bilateral adrenal masses. Right-sided lesion previously measured 5.7 x 4.3 cm and today on series  2, image 62 6.3 by 4.6 cm. Left-sided lesion measured 3.5 x 2.9 cm previously and today on series 2, image 61 5.8 by 4.9 cm. Second lesion just caudal along the adrenal gland is now seen as well. No enhancing renal mass or collecting system dilatation. The ureters have normal course and caliber down to the bladder. Preserved contours of the urinary bladder with a left-sided bladder diverticula. Stomach/Bowel: The large bowel is nondilated. Diffuse colonic stool. Significant stool along the right side of the colon. Normal appendix. Stomach is nondilated. The small bowel itself is nondilated. Vascular/Lymphatic: Normal caliber aorta and IVC with scattered vascular calcifications. However there are developing abnormal lymph nodes identified in the abdomen. This includes periceliac location with lymph node measuring 5.1 by 3.9 cm. Series 2, image 60. Left para-aortic nodes. Example series 2, image 72 measuring 2.5 2.3 cm. Least 2 adjacent nodes are seen as well in addition. Reproductive: Prostate is unremarkable. Other: Mesenteric metastatic disease is also again seen as on previous but significantly progressed. The lesion in the anterior mesentery which measured 4.9 x 2.6 cm on the previous examination, today is a slightly different location and likely related to tethering with the adjacent wall of the transverse colon and is measured on series 2, image 82 at 5.9 by 6.2 cm. The right-sided mid mesentery lesion which had measured 3.1 x 1.9 cm, today is seen on image 97 measuring 4.9 by 3.9 cm. There is a lesion retrorectal in the low pelvis which is new on series 2, image 116 measuring 3.7 by 2.6 cm. Slight mass effect along the  adjacent bowel. Trace ascites identified. No free air. Musculoskeletal: Slight curvature of the lumbar spine. Scattered degenerative changes of the spine and pelvis. IMPRESSION: Overall significant interval progression of disease. Increased size of the right hilar lung mass with occlusion of the adjacent bronchus, drowned lung appearance of the right lower lobe. This also more invasion of the right hilum with poor visualization of the right-sided pulmonary veins. Increasing lymph nodes in the mediastinum, upper abdomen and retroperitoneum. Development of pancreatic metastatic lesions. Increasing bilateral adrenal masses in size and number. Increasing extent and distribution of peritoneal metastatic disease. Electronically Signed   By: Jill Side M.D.   On: 04/08/2022 14:51   DG Chest Port 1 View  Result Date: 04/08/2022 CLINICAL DATA:  Chest pain EXAM: PORTABLE CHEST 1 VIEW COMPARISON:  11/25/2021 FINDINGS: Moderate right-sided pleural effusion and right basilar volume loss or consolidation. Pulmonary vascular congestion. No pneumothorax. Right-sided Port-A-Cath tip overlies RA/SVC junction. IMPRESSION: Right base consolidation and moderate effusion. Pulmonary vascular congestion. Electronically Signed   By: Sammie Bench M.D.   On: 04/08/2022 12:45   DG Knee 1-2 Views Left  Result Date: 03/30/2022 CLINICAL DATA:  Left knee pain. EXAM: LEFT KNEE - 1-2 VIEW COMPARISON:  None Available. FINDINGS: Two views. No evidence of fracture, dislocation, or joint effusion. Quadriceps tendon enthesopathy along superior patella. Small exostosis along the medial aspect of the right medial femoral condyle. Soft tissues are unremarkable. IMPRESSION: No acute fracture, dislocation or joint effusion. Quadriceps tendon enthesopathy. Electronically Signed   By: Emmit Alexanders M.D.   On: 03/30/2022 16:18    PERFORMANCE STATUS (ECOG) : 1 - Symptomatic but completely ambulatory  Review of Systems Unless otherwise noted,  a complete review of systems is negative.  Physical Exam General: NAD Pulmonary: Unlabored Extremities: no edema, no joint deformities Skin: no rashes Neurological: Weakness but otherwise nonfocal  IMPRESSION: Patient seen in the ED.  CTs of the chest, abdomen, and pelvis and MRI of the brain are pending.  Patient's overall decline is highly concerning for disease progression as patient has been off cancer treatment since November.  I have had multiple previous conversations with patient and wife regarding his overall poor prognosis in setting of terminal cancer.  Historically, patient and wife have been reluctant to want to engage in conversations regarding these topics.  They have cited a belief that God will cure his cancer. Patient is deferential to his wife for decision making.  Today, I again met with patient's wife to discuss goals.  I explained that his overall condition is declining and that he is nearing end-of-life.  He is now bedbound with poor oral intake.  Patient's performance status excludes the option of additional cancer treatment.  She stated that she chooses not to be around people that bring up the topic of death.  She says that she has strong conviction that God will cure him.    At this point, she is struggling with his care at home.  He is now bedbound and she has limited support of family.  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  She does not have the financial means to pay out-of-pocket for in-home care.  We have previously discussed and I have encouraged hospice involvement but she has been unwilling to accept hospice care.  Patient has recently received home health but he is not a candidate for physical therapy at this point.  Patient's wife now tells me that she may consider hospice as she does not see any other option for getting help at home.  We again discussed CODE STATUS.  I explained the  futility and trauma associated with resuscitative efforts in the setting of end-stage cancer.  Patient's wife tells me that she wants him to remain a full code and does not wish to have this conversation.  She says that she will pray about it and at some point God may change her mind.  Wife declined my offer to have chaplain involved.   PLAN: -Continue current scope of treatment -Full code -Will await CT/MRI, which may further facilitate conversations regarding goals   Time Total: 70 minutes  Visit consisted of counseling and education dealing with the complex and emotionally intense issues of symptom management and palliative care in the setting of serious and potentially life-threatening illness.Greater than 50%  of this time was spent counseling and coordinating care related to the above assessment and plan.  Signed by: Altha Harm, PhD, NP-C

## 2022-04-08 NOTE — ED Provider Notes (Signed)
[facility] Provider Note    Event Date/Time   First MD Initiated Contact with Patient 04/08/22 1226     (approximate)   History   Weakness   HPI  Maurice Simpson is a 67 y.o. male history of small cell lung cancer, intracranial metastatic disease currently off treatment.  Also reviewed palliative care note from this week that denotes the patient's cancer has been deemed terminal   Has been experiencing recurrent falls weakness knee pain mostly wheelchair-bound  History of COPD and lung cancer  Patient has been experiencing increasing weakness day over day now for some time.  To the point now that he has difficulty walking.  No fevers or chills.  No chest pain denies pain anywhere.  Reports he just feels like he has no energy or strength left.  His wife reports that he was too weak to even get to his follow-up CT scan which was supposed to happen a couple days ago.  He has finished chemotherapy.  According to the wife he has been "cured"   Physical Exam   Triage Vital Signs: ED Triage Vitals  Enc Vitals Group     BP 04/08/22 1139 121/67     Pulse Rate 04/08/22 1139 100     Resp 04/08/22 1139 18     Temp 04/08/22 1139 97.9 F (36.6 C)     Temp src --      SpO2 04/08/22 1139 97 %     Weight 04/08/22 1140 150 lb (68 kg)     Height 04/08/22 1140 5\' 8"  (1.727 m)     Head Circumference --      Peak Flow --      Pain Score 04/08/22 1139 0     Pain Loc --      Pain Edu? --      Excl. in [place]? --     Most recent vital signs: Vitals:   04/08/22 1139  BP: 121/67  Pulse: 100  Resp: 18  Temp: 97.9 F (36.6 C)  SpO2: 97%     General: Awake, no distress.  CV:  Good peripheral perfusion.  Normal rate and tones Resp:  Normal effort.  Diminished lung sounds bilaterally.  No wheezes rales or rhonchi.  No distress noted Abd:  No distention.  Soft nontender nondistended Other:  4-5 strength lower extremities bilaterally.  Generalized weakness.  No focal  deficit.   ED Results / Procedures / Treatments   Labs (all labs ordered are listed, but only abnormal results are displayed) Labs Reviewed  BASIC METABOLIC PANEL - Abnormal; Notable for the following components:      Result Value   Sodium 134 (*)    Chloride 96 (*)    Glucose, Bld 119 (*)    BUN 29 (*)    All other components within normal limits  CBC - Abnormal; Notable for the following components:   WBC 13.8 (*)    RBC 3.87 (*)    Hemoglobin 11.6 (*)    HCT 35.9 (*)    All other components within normal limits  URINALYSIS, ROUTINE W REFLEX MICROSCOPIC - Abnormal; Notable for the following components:   Color, Urine YELLOW (*)    APPearance HAZY (*)    Protein, ur 30 (*)    All other components within normal limits  TROPONIN I (HIGH SENSITIVITY) - Abnormal; Notable for the following components:   Troponin I (High Sensitivity) 27 (*)    All other components within normal  limits  TROPONIN I (HIGH SENSITIVITY)     EKG  Interpreted by me at 1145 heart rate 100 QRS 70 QTc 400 Normal sinus rhythm.  Slight baseline artifact.  No evidence of acute ischemia.  Occasional PAC   RADIOLOGY  DG Chest Port 1 View  Result Date: 04/08/2022 CLINICAL DATA:  Chest pain EXAM: PORTABLE CHEST 1 VIEW COMPARISON:  11/25/2021 FINDINGS: Moderate right-sided pleural effusion and right basilar volume loss or consolidation. Pulmonary vascular congestion. No pneumothorax. Right-sided Port-A-Cath tip overlies RA/SVC junction. IMPRESSION: Right base consolidation and moderate effusion. Pulmonary vascular congestion. Electronically Signed   By: Sammie Bench M.D.   On: 04/08/2022 12:45    Chest x-ray interpreted as a right base infiltrate with effusion.  Interpreted by me.  Also reviewed radiology read.  Unclear if this could be representative of infection or pneumonia.  Suspicious.  Will proceed with CT imaging to further delineate.  Discussed this with the hospitalist, Dr. Ernestina Patches, hospital service  will follow-up on CT imaging and consideration of whether this may or may not be representative of pneumonia requiring antibiotic   PROCEDURES:  Critical Care performed: No  Procedures   MEDICATIONS ORDERED IN ED: Medications - No data to display   IMPRESSION / MDM / [place] / ED COURSE  I reviewed the triage vital signs and the nursing notes.                              Differential diagnosis includes, but is not limited to, generalized weakness dehydration, electrode abnormality, increasing cancer burden, pneumonia, or other causation of illness.  No chest pain.  Troponin is minimally elevated suspect likely demand ischemia.  No clinical symptoms that are highly suggestive of ACS.  Patient's presentation is most consistent with acute complicated illness / injury requiring diagnostic workup.   The patient is on the cardiac monitor to evaluate for evidence of arrhythmia and/or significant heart rate changes.  Consulted with Dr. Janese Banks, she advises obtaining CT chest abdomen pelvis with contrast and will provide oncology consult.  Merrily Pew Borders will also provide palliative care consultation.  Consulted with and patient admitted to the hospital service of Dr. Ernestina Patches     FINAL CLINICAL IMPRESSION(S) / ED DIAGNOSES   Final diagnoses:  Lung infiltrate  Weakness  Generalized weakness  Elevated troponin     Rx / DC Orders   ED Discharge Orders     None        Note:  This document was prepared using Dragon voice recognition software and may include unintentional dictation errors.   Delman Kitten, MD 04/08/22 1346

## 2022-04-08 NOTE — ED Notes (Signed)
Pt requesting ensure- states he's having difficulty swallowing solid food.

## 2022-04-08 NOTE — Assessment & Plan Note (Signed)
Stage IV small cell lung cancer with metastatic disease to the brain, to the adrenal, to the pancreas and omentum Worsening lung cancer on imaging with progressive weakness  Oncology and palliative care consulted.

## 2022-04-08 NOTE — TOC CM/SW Note (Signed)
CSW acknowledges consult for home health/DME needs. Palliative is involved and having conversations with wife. Will follow progress until plan determined.  Maurice Simpson, Maurice Simpson

## 2022-04-08 NOTE — Assessment & Plan Note (Signed)
Palliative care consulted  Appreciate input  Wife wishes pt to be full code

## 2022-04-09 DIAGNOSIS — J181 Lobar pneumonia, unspecified organism: Secondary | ICD-10-CM | POA: Diagnosis not present

## 2022-04-09 DIAGNOSIS — I5A Non-ischemic myocardial injury (non-traumatic): Secondary | ICD-10-CM | POA: Diagnosis not present

## 2022-04-09 DIAGNOSIS — J449 Chronic obstructive pulmonary disease, unspecified: Secondary | ICD-10-CM | POA: Diagnosis not present

## 2022-04-09 DIAGNOSIS — E440 Moderate protein-calorie malnutrition: Secondary | ICD-10-CM

## 2022-04-09 DIAGNOSIS — Z515 Encounter for palliative care: Secondary | ICD-10-CM | POA: Diagnosis not present

## 2022-04-09 DIAGNOSIS — R531 Weakness: Secondary | ICD-10-CM | POA: Diagnosis not present

## 2022-04-09 LAB — COMPREHENSIVE METABOLIC PANEL
ALT: 50 U/L — ABNORMAL HIGH (ref 0–44)
AST: 51 U/L — ABNORMAL HIGH (ref 15–41)
Albumin: 2.7 g/dL — ABNORMAL LOW (ref 3.5–5.0)
Alkaline Phosphatase: 123 U/L (ref 38–126)
Anion gap: 9 (ref 5–15)
BUN: 29 mg/dL — ABNORMAL HIGH (ref 8–23)
CO2: 24 mmol/L (ref 22–32)
Calcium: 9.1 mg/dL (ref 8.9–10.3)
Chloride: 100 mmol/L (ref 98–111)
Creatinine, Ser: 0.71 mg/dL (ref 0.61–1.24)
GFR, Estimated: >60 mL/min (ref >60)
Glucose, Bld: 119 mg/dL — ABNORMAL HIGH (ref 70–99)
Potassium: 3.5 mmol/L (ref 3.5–5.1)
Sodium: 133 mmol/L — ABNORMAL LOW (ref 135–145)
Total Bilirubin: 0.5 mg/dL (ref 0.3–1.2)
Total Protein: 7.4 g/dL (ref 6.5–8.1)

## 2022-04-09 LAB — CBC
HCT: 28.7 % — ABNORMAL LOW (ref 39.0–52.0)
Hemoglobin: 9.6 g/dL — ABNORMAL LOW (ref 13.0–17.0)
MCH: 30.5 pg (ref 26.0–34.0)
MCHC: 33.4 g/dL (ref 30.0–36.0)
MCV: 91.1 fL (ref 80.0–100.0)
Platelets: 217 10*3/uL (ref 150–400)
RBC: 3.15 MIL/uL — ABNORMAL LOW (ref 4.22–5.81)
RDW: 14.2 % (ref 11.5–15.5)
WBC: 14.3 10*3/uL — ABNORMAL HIGH (ref 4.0–10.5)
nRBC: 0 % (ref 0.0–0.2)

## 2022-04-09 MED ORDER — GLUCERNA SHAKE PO LIQD
237.0000 mL | Freq: Three times a day (TID) | ORAL | Status: DC
  Administered 2022-04-09: 237 mL via ORAL

## 2022-04-09 MED ORDER — BUDESONIDE 0.25 MG/2ML IN SUSP: 0.2500 mg | Freq: Two times a day (BID) | RESPIRATORY_TRACT | Status: DC | PRN

## 2022-04-09 MED ORDER — CHLORHEXIDINE GLUCONATE CLOTH 2 % EX PADS
6.0000 | MEDICATED_PAD | Freq: Every day | CUTANEOUS | Status: DC
  Administered 2022-04-09 – 2022-04-10 (×2): 6 via TOPICAL

## 2022-04-09 MED ORDER — ENSURE ENLIVE PO LIQD: 237.0000 mL | Freq: Four times a day (QID) | ORAL | Status: DC

## 2022-04-09 MED ORDER — DOCUSATE SODIUM 100 MG PO CAPS: 100.0000 mg | ORAL_CAPSULE | Freq: Every day | ORAL | Status: DC | PRN

## 2022-04-09 MED ORDER — ALBUTEROL SULFATE (2.5 MG/3ML) 0.083% IN NEBU: 2.5000 mg | INHALATION_SOLUTION | Freq: Four times a day (QID) | RESPIRATORY_TRACT | Status: DC | PRN

## 2022-04-09 MED ORDER — GLUCERNA SHAKE PO LIQD
237.0000 mL | Freq: Four times a day (QID) | ORAL | Status: DC
  Administered 2022-04-09 – 2022-04-10 (×4): 237 mL via ORAL

## 2022-04-09 MED ORDER — OXYCODONE HCL 5 MG PO TABS
5.0000 mg | ORAL_TABLET | ORAL | Status: DC | PRN
  Administered 2022-04-09 – 2022-04-10 (×3): 5 mg via ORAL
  Filled 2022-04-09 (×3): qty 1

## 2022-04-09 MED ORDER — ALUM & MAG HYDROXIDE-SIMETH 200-200-20 MG/5ML PO SUSP: 30.0000 mL | Freq: Four times a day (QID) | ORAL | Status: DC | PRN

## 2022-04-09 MED ORDER — PANTOPRAZOLE SODIUM 40 MG PO TBEC
40.0000 mg | DELAYED_RELEASE_TABLET | Freq: Every day | ORAL | Status: DC
  Administered 2022-04-09 – 2022-04-10 (×2): 40 mg via ORAL
  Filled 2022-04-09 (×2): qty 1

## 2022-04-09 NOTE — Assessment & Plan Note (Signed)
Stage IV small cell lung cancer with metastatic disease to the brain, to the adrenal, to the pancreas and omentum Worsening lung cancer on imaging with progressive weakness.  Apreciated palliative and oncology input.

## 2022-04-09 NOTE — TOC Progression Note (Signed)
Transition of Care [facility]) - Progression Note    Patient Details  Name: Maurice Simpson MRN: [id number] Date of Birth: 1955-10-06  [facility]) CM/SW Contact  Gerilyn Pilgrim, LCSW Phone Number: 04/09/2022, 4:15 PM  Clinical Narrative:   CSW spoke with wife who is agreeable now to [facility] hospice. Wife reports she still does not want hospital bed as she does not feel she can accommodate this in her space currently she is open to ideas but at this time plans to have pt in the recliner on a bed pan. Wife reports limited supports able to assist at home but states she will be with him 24/7. Message relayed to MD and hospice team.           Expected Discharge Plan and Services                                               Social Determinants of Health (SDOH) Interventions SDOH Screenings   Food Insecurity: Food Insecurity Present (04/08/2022)  Housing: Medium Risk (04/08/2022)  Transportation Needs: Unmet Transportation Needs (04/08/2022)  Utilities: At Risk (04/08/2022)  Alcohol Screen: Low Risk  (07/13/2021)  Depression (PHQ2-9): High Risk (07/13/2021)  Financial Resource Strain: Low Risk  (07/13/2021)  Physical Activity: Inactive (07/20/2019)  Social Connections: Moderately Isolated (07/13/2021)  Stress: Stress Concern Present (07/13/2021)  Tobacco Use: Medium Risk (04/08/2022)    Readmission Risk Interventions    11/27/2021   11:30 AM  Readmission Risk Prevention Plan  Transportation Screening Complete  PCP or Specialist Appt within 3-5 Days Complete  HRI or [place] Complete  Social Work Consult for [place] Planning/Counseling Complete  Palliative Care Screening Not Applicable  Medication Review Press photographer) Complete

## 2022-04-09 NOTE — Progress Notes (Addendum)
Initial Nutrition Assessment  DOCUMENTATION CODES:   Non-severe (moderate) malnutrition in context of chronic illness  INTERVENTION:   -Glucerna Shake po QID, each supplement provides 220 kcal and 10 grams of protein  -MVI with minerals daily -Liberalize diet tor regular for wider variety of meal selections -Magic cup TID with meals, each supplement provides 290 kcal and 9 grams of protein   NUTRITION DIAGNOSIS:   Moderate Malnutrition related to chronic illness (stage IV lung cancer) as evidenced by moderate fat depletion, moderate muscle depletion, percent weight loss.  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  Pt admitted with progressive weakness in the setting of metastatic lung cancer.   Reviewed I/O's: +1.1 L x 24 hours   Pt familiar to this RD due to previous admission. Pt with stage IV lung cancer with mets to the brain, omentum, and pancreas. Last cancer treatment in 11/2021. Pt is not a candidate for further cancer treatments.   Spoke with pt and family members at bedside. Pt smiled at this RD when noted that today was his birthday. Per wife, pt continues to have very poor oral intake due to nausea, taste changes, and food aversions. Frequently consumed foods include vanilla Ensure, vanilla pudding, and vanilla ice cream. Pt with multiple missing teeth, which also limites his food options; he has dentures, but admits that they make him gag when he puts them in (suspect dentures could be ill fitting secondary to weight loss). Pt admits that dentures do fit funny when he tries to put them in, but unable to provide further insight into this. Noted pt did  not eat his lunch meal.   Pt shares that he can only drink the Ensure original in vanilla flavor. All other varieties of Ensure (Plus, Complete, Enlive) have a "chalky" flavor that he does not tolerate. He also does not like Boost products. Glucerna has been trialed and pt likes this and is willing to drink. He also liked [employer] which he had during last admission.   Per pt wife, pt UBW is around 150#. She is unsure if he has lost weight as she does not remember when he last weighed. Reviewed wt hx; pt has experienced a 17.7% wt loss over the past 6 months, which is significant for time frame.   Case discussed with RN and reviewed changes to plan of care.   Palliative care following for goals of care. They are recommending hospice, however, pt wife does not want to pursue this at this time. RD discussed PEG with palliative care, MD, and oncology service during last admission; no plans for PEG as pt is not receiving further treatments and would not improve outcomes.   Medications reviewed and include lovenox.   Labs reviewed: Na: 133, CBGS: 94.   NUTRITION - FOCUSED PHYSICAL EXAM:  Flowsheet Row Most Recent Value  Orbital Region Severe depletion  Upper Arm Region Moderate depletion  Thoracic and Lumbar Region Moderate depletion  Buccal Region Moderate depletion  Temple Region Moderate depletion  Clavicle Bone Region Moderate depletion  Clavicle and Acromion Bone Region Moderate depletion  Scapular Bone Region Moderate depletion  Dorsal Hand Moderate depletion  Patellar Region Moderate  depletion  Anterior Thigh Region Moderate depletion  Posterior Calf Region Moderate depletion  Edema (RD Assessment) None  Hair Reviewed  Eyes Reviewed  Mouth Reviewed  Skin Reviewed  Nails Reviewed       Diet Order:   Diet Order             Diet regular Fluid consistency: Thin  Diet effective now                   EDUCATION NEEDS:   Education needs have been addressed  Skin:   Skin Assessment: Skin Integrity Issues: Skin Integrity Issues:: Stage I Stage I: sacrum  Last BM:  04/09/22 (type 4)  Height:   Ht Readings from Last 1 Encounters:  04/08/22 5\' 8"  (1.727 m)    Weight:   Wt Readings from Last 1 Encounters:  04/08/22 68 kg    Ideal Body Weight:  70 kg  BMI:  Body mass index is 22.81 kg/m.  Estimated Nutritional Needs:   Kcal:  2050-2250  Protein:  105-120 grams  Fluid:  > 2 L    Loistine Chance, RD, LDN, [place] Registered Dietitian II Certified Diabetes Care and Education Specialist Please refer to [facility] for RD and/or RD on-call/weekend/after hours pager

## 2022-04-09 NOTE — Assessment & Plan Note (Signed)
Troponin slightly elevated and then normalized.

## 2022-04-09 NOTE — Assessment & Plan Note (Signed)
Postobstructive in nature.  Patient given Rocephin and Zithromax here.  Will switch over to Zithromax and Omnicef upon discharge for 3 more days

## 2022-04-09 NOTE — Progress Notes (Addendum)
[facility]) Hospital Liaison Note  Received request from PMT provider, Lennox Solders., NP, for hospice services at home after discharge.  TOC/Darrian notified via epic chat of referral.    Spoke with spouse [name] to initiate education related to hospice philosophy, services, and team approach to care. Spouse stated that she nor the patient wanted Hospice services at this time; however, they felt that they needed an aide in the home, and medical equipment that the insurance wouldn't pay for unless Hospice services were involved.  MSW provided further education regarding the Hospice philosophy and family in agreement that Hospice goals are not aligned to their [place].  Family requesting additional conversations with hospital staff for alternate discharge options. PMT and TOC notified of information above.     Addendum: Notified by TOC/Darrian that family is now receptive to hospice services. TOC & MSW discussed family and in agreement for further hospice conversations to be held on 3.30 to provide family additional time to finalized [place].  Please call with any questions/concerns.    Thank you for the opportunity to participate in this patient's care.   Phillis Haggis, MSW [facility] Liaison  [phone]

## 2022-04-09 NOTE — Progress Notes (Signed)
Progress Note   Patient: Maurice Simpson [id number] DOB: [birth year] DOA: 04/08/2022     0 DOS: the patient was seen and examined on 04/09/2022   Brief hospital course: 67 y.o. 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  Patient with noted baseline metastatic lung cancer.  No longer on treatment as of November last year.  Per the wife, patient overall worsening weakness with significant difficulty with ambulation as well as overall decreased p.o. intake.  Noted admission back in November for similar symptoms.  Was treated for lobar pneumonia at that point as well as formal evaluation with palliative care.  Wife declined palliative intervention at that time.  Patient has had overall decline since this point.  Mild cough.  No reported shortness of breath.  No reported vomiting or diarrhea. Presented to the ER afebrile, hemodynamically stable.  White count 13.8, hemoglobin 11.6.  Troponin 20s.  EKG stable.  Initial chest x-ray with right base consolidation and moderate effusion.  CT chest with increased right hilar lung mass with occlusion of the adjacent bronchus withdrawal and appearance of the right lower lobe as well as more invasion of the right hilum and increasing lymph nodes in the mediastinum upper abdomen and retroperitoneum as well as development of pancreatic metastatic lesions.  Also with increasing adrenal masses in size and number.  Increase in extent and distribution of peritoneal metastatic disease.  Palliative care also consulted at the bedside. Dr. Janese Banks w/ oncology also aware of case.  3/29.  They declined hospice services and would like more help at home.  Toc to help arrange.  Assessment and Plan: * Weakness Progressive worsening weakness in the setting of metastatic lung disease.  Will get PT and OT consult but likely will not be able to do much.  Will set  up [facility] aide and RN at least.  TOC to talk with family about equipment.   Lobar pneumonia ([place]) Postobstructive in nature.  Continue antibiotics  COPD (chronic obstructive pulmonary disease) (HCC) Respiratory status stable   Myocardial injury Troponin slightly elevated and then normalized.  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  Apreciated palliative and oncology input.  Moderate Malnutrition          Subjective: Patient complains of weakness and back pain.  Has small cell metastatic lung cancer.  Physical Exam: Vitals:   04/08/22 2104 04/08/22 2329 04/09/22 0552 04/09/22 0741  BP: (!) 101/57 97/61 105/69 99/60  Pulse: 88 93 88 83  Resp: 20 20 17 18   Temp: 98.7 F (37.1 C) 98.7 F (37.1 C) 98.1 F (36.7 C) 98.3 F (36.8 C)  TempSrc:      SpO2: 97% 95% 98% 94%  Weight:      Height:       Physical Exam HENT:     Head: Normocephalic.     Mouth/Throat:     Pharynx: No oropharyngeal exudate.  Eyes:     General: Lids are normal.     Conjunctiva/sclera: Conjunctivae normal.  Cardiovascular:     Rate and Rhythm: Normal rate and regular rhythm.     Heart sounds: Normal heart sounds, S1 normal and S2 normal.  Pulmonary:  Breath sounds: Examination of the right-lower field reveals decreased breath sounds. Examination of the left-lower field reveals decreased breath sounds. Decreased breath sounds present. No wheezing, rhonchi or rales.  Abdominal:     Palpations: Abdomen is soft.     Tenderness: There is no abdominal tenderness.  Skin:    General: Skin is warm.     Findings: No rash.  Neurological:     Mental Status: He is alert and oriented to person, place, and time.     Comments: Barely able to straight leg raise     Data  Reviewed: Lab date and radiological data reviewed  Family Communication: spoke with wife at the bedside  Disposition: Status is: Observation TOC working on [facility] and equipment but may now go with hospice  Planned Discharge Destination: Home with [place]    Time spent: 28 minutes Spoke with palliative, hospice and TOC  Author: Loletha Grayer, MD 04/09/2022 4:04 PM  For on call review [email or web address].

## 2022-04-09 NOTE — Assessment & Plan Note (Signed)
Present on admission see full description below. 

## 2022-04-09 NOTE — Hospital Course (Signed)
67 y.o. 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  Patient with noted baseline metastatic lung cancer.  No longer on treatment as of November last year.  Per the wife, patient overall worsening weakness with significant difficulty with ambulation as well as overall decreased p.o. intake.  Noted admission back in November for similar symptoms.  Was treated for lobar pneumonia at that point as well as formal evaluation with palliative care.  Wife declined palliative intervention at that time.  Patient has had overall decline since this point.  Mild cough.  No reported shortness of breath.  No reported vomiting or diarrhea. Presented to the ER afebrile, hemodynamically stable.  White count 13.8, hemoglobin 11.6.  Troponin 20s.  EKG stable.  Initial chest x-ray with right base consolidation and moderate effusion.  CT chest with increased right hilar lung mass with occlusion of the adjacent bronchus withdrawal and appearance of the right lower lobe as well as more invasion of the right hilum and increasing lymph nodes in the mediastinum upper abdomen and retroperitoneum as well as development of pancreatic metastatic lesions.  Also with increasing adrenal masses in size and number.  Increase in extent and distribution of peritoneal metastatic disease.  Palliative care also consulted at the bedside. Dr. Janese Banks w/ oncology also aware of case.  3/29.  They declined hospice services and would like more help at home.  Toc to help arrange. 3/30.  Patient and family agreeable to a hospital bed at home.  This will be delivered this evening.  Patient will be transferred home by EMS.  The family does have hospice services phone number if the patient declines and they wish to pursue hospice.  Family interested in home health where they have more help at home taking care of him.  Full code.

## 2022-04-09 NOTE — TOC Progression Note (Signed)
Transition of Care [facility]) - Progression Note    Patient Details  Name: PRINCEMICHAEL GRAVIER MRN: [id number] Date of Birth: [birth year]  Transition of Care [facility]) CM/SW Contact  Gerilyn Pilgrim, LCSW Phone Number: 04/09/2022, 2:44 PM  Clinical Narrative:   [facility] accepted for [facility] RN/AID.         Expected Discharge Plan and Services                                               Social Determinants of Health (SDOH) Interventions SDOH Screenings   Food Insecurity: Food Insecurity Present (04/08/2022)  Housing: Medium Risk (04/08/2022)  Transportation Needs: Unmet Transportation Needs (04/08/2022)  Utilities: At Risk (04/08/2022)  Alcohol Screen: Low Risk  (07/13/2021)  Depression (PHQ2-9): High Risk (07/13/2021)  Financial Resource Strain: Low Risk  (07/13/2021)  Physical Activity: Inactive (07/20/2019)  Social Connections: Moderately Isolated (07/13/2021)  Stress: Stress Concern Present (07/13/2021)  Tobacco Use: Medium Risk (04/08/2022)    Readmission Risk Interventions    11/27/2021   11:30 AM  Readmission Risk Prevention Plan  Transportation Screening Complete  PCP or Specialist Appt within 3-5 Days Complete  HRI or [place] Complete  Social Work Consult for [place] Planning/Counseling Complete  Palliative Care Screening Not Applicable  Medication Review Press photographer) Complete

## 2022-04-09 NOTE — Assessment & Plan Note (Signed)
Progressive worsening weakness in the setting of metastatic lung disease.  Will get PT and OT consult but likely will not be able to do much.  Will set up [facility] aide and RN at least.  TOC to talk with family about equipment.

## 2022-04-09 NOTE — Assessment & Plan Note (Signed)
Respiratory status stable. 

## 2022-04-09 NOTE — Progress Notes (Signed)
[place] at [facility] [phone] [phone]   Name: Maurice Simpson Date: 04/09/2022 MRN: [id number]  DOB: Jun 25, 1955  Patient Care Team: Sindy Guadeloupe, MD as PCP - General (Oncology) Telford Nab, RN as Oncology Nurse Navigator Leim Fabry, MD as Consulting Physician (Orthopedic Surgery)    REASON FOR CONSULTATION: Maurice Simpson is a 67 y.o. 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  Patient has been off treatment with Tecentriq since November 2023 due to poor tolerance.  Patient has had overall decline in performance status with poor oral intake.  He has no showed or canceled multiple attempts at repeat scans.  Patient presented to the hospital on 04/08/2022 with worsening weakness.  Palliative care was consulted to address goals. .    CODE STATUS: Full code  PAST MEDICAL HISTORY: Past Medical History:  Diagnosis Date   Cancer ([place])    COPD (chronic obstructive pulmonary disease) ([place])    Dyspnea     PAST SURGICAL HISTORY:  Past Surgical History:  Procedure Laterality Date   CATARACT EXTRACTION Right 03/10/2021   IR IMAGING GUIDED PORT INSERTION  07/27/2021    HEMATOLOGY/ONCOLOGY HISTORY:  Oncology History  Small cell lung cancer ([place])  07/21/2021 Initial Diagnosis   Small cell lung cancer ([place])   07/21/2021 Cancer Staging   Staging form: Lung, AJCC 8th Edition - Clinical stage from 07/21/2021: Stage IVB (cT2, cN0, pM1c) - Signed by Sindy Guadeloupe, MD on 07/21/2021   08/03/2021 - 09/01/2021 Chemotherapy   Patient is on Treatment Plan : LUNG SCLC Carboplatin + Etoposide + Atezolizumab Induction q21d / Atezolizumab Maintenance q21d     09/01/2021 -  Chemotherapy   Patient is on Treatment Plan : LUNG SCLC Carboplatin + Etoposide + Atezolizumab Induction q21d x 4 cycles / Atezolizumab Maintenance q21d        ALLERGIES:  is allergic to bee venom.  MEDICATIONS:  Current Facility-Administered Medications  Medication Dose Route Frequency Provider Last Rate Last Admin   albuterol (PROVENTIL) (2.5 MG/3ML) 0.083% nebulizer solution 2.5 mg  2.5 mg Nebulization Q6H PRN Loletha Grayer, MD       alum & mag hydroxide-simeth (MAALOX/MYLANTA) 200-200-20 MG/5ML suspension 30 mL  30 mL Oral Q6H PRN Wieting, Richard, MD       azithromycin (ZITHROMAX) 500 mg in sodium chloride 0.9 % 250 mL IVPB  500 mg Intravenous Q24H Deneise Lever, MD 250 mL/hr at 04/08/22 2120 500 mg at 04/08/22 2120   budesonide (PULMICORT) nebulizer solution 0.25 mg  0.25 mg Nebulization BID PRN Loletha Grayer, MD       cefTRIAXone (ROCEPHIN) 2 g in sodium chloride 0.9 % 100 mL IVPB  2 g Intravenous Q24H Deneise Lever, MD 200 mL/hr at 04/08/22 2038 2 g at 04/08/22 2038   Chlorhexidine Gluconate Cloth 2 % PADS 6 each  6 each Topical Daily Loletha Grayer, MD   6 each at 04/09/22 0859   docusate sodium (COLACE) capsule 100 mg  100 mg Oral Daily PRN Loletha Grayer, MD       enoxaparin (LOVENOX) injection 40 mg  40 mg Subcutaneous Q24H Deneise Lever, MD   40 mg at 04/08/22 2117   feeding supplement (ENSURE ENLIVE / ENSURE PLUS) liquid 237 mL  237 mL Oral QID Loletha Grayer, MD       ondansetron [facility]) tablet 4 mg  4 mg  Oral Q6H PRN Deneise Lever, MD       Or   ondansetron [facility]) injection 4 mg  4 mg Intravenous Q6H PRN Deneise Lever, MD       oxyCODONE (Oxy IR/ROXICODONE) immediate release tablet 5 mg  5 mg Oral Q4H PRN Loletha Grayer, MD   5 mg at 04/09/22 1227   pantoprazole (PROTONIX) EC tablet 40 mg  40 mg Oral Daily Loletha Grayer, MD   40 mg at 04/09/22 0856    VITAL SIGNS: BP 99/60 (BP Location: Right Arm)   Pulse 83   Temp 98.3 F (36.8 C)   Resp 18   Ht 5\' 8"  (1.727 m)   Wt 150 lb (68 kg)   SpO2 94%   BMI 22.81 kg/m  Filed Weights   04/08/22 1140  Weight: 150 lb (68 kg)    Estimated  body mass index is 22.81 kg/m as calculated from the following:   Height as of this encounter: 5\' 8"  (1.727 m).   Weight as of this encounter: 150 lb (68 kg).  LABS: CBC:    Component Value Date/Time   WBC 14.3 (H) 04/09/2022 0727   HGB 9.6 (L) 04/09/2022 0727   HGB 10.8 (L) 03/30/2022 1524   HGB 18.0 (H) 04/30/2021 1136   HCT 28.7 (L) 04/09/2022 0727   HCT 53.3 (H) 04/30/2021 1136   PLT 217 04/09/2022 0727   PLT 244 03/30/2022 1524   PLT 156 04/30/2021 1136   MCV 91.1 04/09/2022 0727   MCV 91 04/30/2021 1136   MCV 94 01/21/2011 0134   NEUTROABS 5.9 03/30/2022 1524   NEUTROABS 4.8 04/30/2021 1136   LYMPHSABS 2.9 03/30/2022 1524   LYMPHSABS 2.9 04/30/2021 1136   MONOABS 0.6 03/30/2022 1524   EOSABS 0.3 03/30/2022 1524   EOSABS 0.3 04/30/2021 1136   BASOSABS 0.1 03/30/2022 1524   BASOSABS 0.1 04/30/2021 1136   Comprehensive Metabolic Panel:    Component Value Date/Time   NA 133 (L) 04/09/2022 0727   NA 136 04/30/2021 1136   NA 139 01/21/2011 0134   K 3.5 04/09/2022 0727   K 3.8 01/21/2011 0134   CL 100 04/09/2022 0727   CL 105 01/21/2011 0134   CO2 24 04/09/2022 0727   CO2 23 01/21/2011 0134   BUN 29 (H) 04/09/2022 0727   BUN 12 04/30/2021 1136   BUN 15 01/21/2011 0134   CREATININE 0.71 04/09/2022 0727   CREATININE 0.76 03/30/2022 1524   CREATININE 0.92 01/21/2011 0134   GLUCOSE 119 (H) 04/09/2022 0727   GLUCOSE 118 (H) 01/21/2011 0134   CALCIUM 9.1 04/09/2022 0727   CALCIUM 9.1 01/21/2011 0134   AST 51 (H) 04/09/2022 0727   AST 39 03/30/2022 1524   ALT 50 (H) 04/09/2022 0727   ALT 27 03/30/2022 1524   ALT 29 01/21/2011 0134   ALKPHOS 123 04/09/2022 0727   ALKPHOS 46 (L) 01/21/2011 0134   BILITOT 0.5 04/09/2022 0727   BILITOT 0.6 03/30/2022 1524   PROT 7.4 04/09/2022 0727   PROT 7.2 04/30/2021 1136   PROT 7.4 01/21/2011 0134   ALBUMIN 2.7 (L) 04/09/2022 0727   ALBUMIN 4.3 04/30/2021 1136   ALBUMIN 3.8 01/21/2011 0134    RADIOGRAPHIC STUDIES: CT  CHEST ABDOMEN PELVIS W CONTRAST  Result Date: 04/08/2022 CLINICAL DATA:  Lung cancer. Weakness. Brain neoplasm. * Tracking Code: BO * EXAM: CT CHEST, ABDOMEN, AND PELVIS WITH CONTRAST TECHNIQUE: Multidetector CT imaging of the chest, abdomen and pelvis was performed following the standard  protocol during bolus administration of intravenous contrast. RADIATION DOSE REDUCTION: This exam was performed according to the departmental dose-optimization program which includes automated exposure control, adjustment of the mA and/or kV according to patient size and/or use of iterative reconstruction technique. CONTRAST:  168mL OMNIPAQUE IOHEXOL 300 MG/ML  SOLN COMPARISON:  Chest CT scan 11/25/2021 and older. Chest abdomen pelvis CT 09/18/2021. FINDINGS: CT CHEST FINDINGS Cardiovascular: Right upper chest port. The port is accessed. No significant pericardial effusion. The heart is nonenlarged. Coronary artery calcifications are seen. The thoracic aorta has a normal course and caliber with mild atherosclerotic calcified plaque. Pulsation artifact along the ascending aorta. Mediastinum/Nodes: Preserved thyroid gland. Normal caliber thoracic esophagus. No specific abnormal lymph node enlargement seen in the axillary region or left hilum. Enlarged lymph nodes seen which is new posterior to the carina right of midline on series 2, image 23 measuring 15 by 12 mm. There are some additional small nodes along the lower posterior mediastinum to the left of the esophagus which are not pathologic but slightly larger today as well. Lungs/Pleura: Breathing motion. The left lung has some reticular changes. No consolidation or pneumothorax. Underlying centrilobular emphysematous changes bilaterally. There is a new tiny right effusion. The previously seen mass centered in the right lung hilum inferiorly has significantly increased in size. There is more extension into the hilum and right side of the mediastinum. There is occlusion of the  bronchus intermedius and right lower lobe bronchus with drowned lung appearance peripherally in the right lower lobe with volume loss. There is also poor visualization of the associated right-sided pulmonary veins. Previous dimension of the mass in the hilum had measured 3.1 x 3.3 cm previously. Today the mass lesion would have dimensions approaching 7.5 by 6.3 cm. There are increasing reticular changes as well in the inferior aspect of the middle lobe. There has likely a component of abnormal nodes in the hilum which is confluence with a mass. Musculoskeletal: Scattered degenerative changes of the spine. Gynecomastia. CT ABDOMEN PELVIS FINDINGS Hepatobiliary: Fatty liver infiltration. No space-occupying liver lesion. Patent portal vein. Gallstone in the nondilated gallbladder. Pancreas: There is new mass along the pancreatic head measuring on series 2, image 67 at 3.9 by 2.7 cm. There is also a lesion in the tail of the pancreas at the splenic hilum measuring 2.4 by 2.3 cm. Also likely third smaller lesion on image 62 of series 2. Spleen: Normal in size without focal abnormality. Adrenals/Urinary Tract: Increasing bilateral adrenal masses. Right-sided lesion previously measured 5.7 x 4.3 cm and today on series 2, image 62 6.3 by 4.6 cm. Left-sided lesion measured 3.5 x 2.9 cm previously and today on series 2, image 61 5.8 by 4.9 cm. Second lesion just caudal along the adrenal gland is now seen as well. No enhancing renal mass or collecting system dilatation. The ureters have normal course and caliber down to the bladder. Preserved contours of the urinary bladder with a left-sided bladder diverticula. Stomach/Bowel: The large bowel is nondilated. Diffuse colonic stool. Significant stool along the right side of the colon. Normal appendix. Stomach is nondilated. The small bowel itself is nondilated. Vascular/Lymphatic: Normal caliber aorta and IVC with scattered vascular calcifications. However there are developing  abnormal lymph nodes identified in the abdomen. This includes periceliac location with lymph node measuring 5.1 by 3.9 cm. Series 2, image 60. Left para-aortic nodes. Example series 2, image 72 measuring 2.5 2.3 cm. Least 2 adjacent nodes are seen as well in addition. Reproductive: Prostate is unremarkable. Other:  Mesenteric metastatic disease is also again seen as on previous but significantly progressed. The lesion in the anterior mesentery which measured 4.9 x 2.6 cm on the previous examination, today is a slightly different location and likely related to tethering with the adjacent wall of the transverse colon and is measured on series 2, image 82 at 5.9 by 6.2 cm. The right-sided mid mesentery lesion which had measured 3.1 x 1.9 cm, today is seen on image 97 measuring 4.9 by 3.9 cm. There is a lesion retrorectal in the low pelvis which is new on series 2, image 116 measuring 3.7 by 2.6 cm. Slight mass effect along the adjacent bowel. Trace ascites identified. No free air. Musculoskeletal: Slight curvature of the lumbar spine. Scattered degenerative changes of the spine and pelvis. IMPRESSION: Overall significant interval progression of disease. Increased size of the right hilar lung mass with occlusion of the adjacent bronchus, drowned lung appearance of the right lower lobe. This also more invasion of the right hilum with poor visualization of the right-sided pulmonary veins. Increasing lymph nodes in the mediastinum, upper abdomen and retroperitoneum. Development of pancreatic metastatic lesions. Increasing bilateral adrenal masses in size and number. Increasing extent and distribution of peritoneal metastatic disease. Electronically Signed   By: Jill Side M.D.   On: 04/08/2022 14:51   DG Chest Port 1 View  Result Date: 04/08/2022 CLINICAL DATA:  Chest pain EXAM: PORTABLE CHEST 1 VIEW COMPARISON:  11/25/2021 FINDINGS: Moderate right-sided pleural effusion and right basilar volume loss or consolidation.  Pulmonary vascular congestion. No pneumothorax. Right-sided Port-A-Cath tip overlies RA/SVC junction. IMPRESSION: Right base consolidation and moderate effusion. Pulmonary vascular congestion. Electronically Signed   By: Sammie Bench M.D.   On: 04/08/2022 12:45   DG Knee 1-2 Views Left  Result Date: 03/30/2022 CLINICAL DATA:  Left knee pain. EXAM: LEFT KNEE - 1-2 VIEW COMPARISON:  None Available. FINDINGS: Two views. No evidence of fracture, dislocation, or joint effusion. Quadriceps tendon enthesopathy along superior patella. Small exostosis along the medial aspect of the right medial femoral condyle. Soft tissues are unremarkable. IMPRESSION: No acute fracture, dislocation or joint effusion. Quadriceps tendon enthesopathy. Electronically Signed   By: Emmit Alexanders M.D.   On: 03/30/2022 16:18    PERFORMANCE STATUS (ECOG) : 4 - Bedbound  Review of Systems Unless otherwise noted, a complete review of systems is negative.  Physical Exam General: NAD Pulmonary: Unlabored  Extremities: no edema, no joint deformities Skin: no rashes Neurological: Weakness but otherwise nonfocal  IMPRESSION: Follow-up visit.  CTs revealed significant interval progression of disease with increased size of right hilar lung mass and occlusion of adjacent bronchus, increased widespread lymphadenopathy, development of pancreatic metastatic lesions, increased size and number of bilateral adrenal metastases.  Attempted to speak with patient and wife regarding results of CT scan but wife stated that she did not want to discuss.  Patient states that he would like to leave the hospital soon as possible.  Wife asked about getting help at home.  She is stated that she is reluctantly agreeable to hospice support at home but her goals are not really aligned with hospice philosophy of care at this point.  Will consult hospice liaison to discuss.  PLAN: -Referral to hospice  Patient plan discussed with Dr. Janese Banks and Dr.  Leslye Peer  Time Total: 15 minutes  Visit consisted of counseling and education dealing with the complex and emotionally intense issues of symptom management and palliative care in the setting of serious and potentially life-threatening illness.Greater  than 50%  of this time was spent counseling and coordinating care related to the above assessment and plan.  Signed by: Altha Harm, PhD, NP-C

## 2022-04-09 NOTE — Progress Notes (Incomplete)
Progress Note   Patient: Maurice Simpson [id number] DOB: Mar 01, 1955 DOA: 04/08/2022     0 DOS: the patient was seen and examined on 04/09/2022   Brief hospital course: 67 y.o. 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  Patient with noted baseline metastatic lung cancer.  No longer on treatment as of November last year.  Per the wife, patient overall worsening weakness with significant difficulty with ambulation as well as overall decreased p.o. intake.  Noted admission back in November for similar symptoms.  Was treated for lobar pneumonia at that point as well as formal evaluation with palliative care.  Wife declined palliative intervention at that time.  Patient has had overall decline since this point.  Mild cough.  No reported shortness of breath.  No reported vomiting or diarrhea. Presented to the ER afebrile, hemodynamically stable.  White count 13.8, hemoglobin 11.6.  Troponin 20s.  EKG stable.  Initial chest x-ray with right base consolidation and moderate effusion.  CT chest with increased right hilar lung mass with occlusion of the adjacent bronchus withdrawal and appearance of the right lower lobe as well as more invasion of the right hilum and increasing lymph nodes in the mediastinum upper abdomen and retroperitoneum as well as development of pancreatic metastatic lesions.  Also with increasing adrenal masses in size and number.  Increase in extent and distribution of peritoneal metastatic disease.  Palliative care also consulted at the bedside. Dr. Janese Banks w/ oncology also aware of case.  3/29.  They declined hospice services and would like more help at home.  Toc to help arrange.  Assessment and Plan: * Weakness Progressive worsening weakness in the setting of metastatic lung disease CT imaging of the chest with significant further progression of metastatic  lung disease Noted admission with similar issues back in November of last year with consultation by palliative care Noted prior discussions about hospice versus palliative interventions in setting of terminal cancer no longer on active treatment  Noted cough with mild white count of 14 and postobstructive changes on CT of the chest with progression of baseline lung cancer- will cover empirically for PNA w/ Rocephin and Azithromycin  De-escalate as appropriate.  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    Palliative care encounter Palliative care consulted  Appreciate input  Wife wishes pt to be full code        {Tip this will not be part of the note when signed Body mass index is 22.81 kg/m. ,  Nutrition Documentation    Flowsheet Row ED to Hosp-Admission (Current) from 04/08/2022 in [place] 1C MEDICAL TELEMETRY  Nutrition Problem Moderate Malnutrition  Etiology chronic illness  [stage IV lung cancer]  Nutrition  Goal Patient will meet greater than or equal to 90% of their needs  Interventions MVI, Glucerna shake, Liberalize Diet, Magic cup     ,  Active Pressure Injury/Wound(s)     Pressure Ulcer  Duration          Pressure Injury 04/08/22 Sacrum Right;Left;Mid Stage 1 -  Intact skin with  non-blanchable redness of a localized area usually over a bony prominence. reddeded and no blanchable <1 day           (Optional):26781}  Subjective: Patient wife interested in more help at home.  Declined hospice.  TOC to set up home health.  Admitted with weakness.  Physical Exam: Vitals:   04/08/22 2104 04/08/22 2329 04/09/22 0552 04/09/22 0741  BP: (!) 101/57 97/61 105/69 99/60  Pulse: 88 93 88 83  Resp: 20 20 17 18   Temp: 98.7 F (37.1 C) 98.7 F (37.1 C) 98.1 F (36.7 C) 98.3 F (36.8 C)  TempSrc:      SpO2: 97% 95% 98% 94%  Weight:      Height:       Physical Exam HENT:     Head: Normocephalic.     Mouth/Throat:     Pharynx: No oropharyngeal exudate.  Eyes:     General: Lids are normal.     Conjunctiva/sclera: Conjunctivae normal.  Cardiovascular:     Rate and Rhythm: Normal rate and regular rhythm.     Heart sounds: Normal heart sounds, S1 normal and S2 normal.  Pulmonary:     Breath sounds: Examination of the right-lower field reveals decreased breath sounds. Examination of the left-lower field reveals decreased breath sounds. Decreased breath sounds present. No wheezing, rhonchi or rales.  Abdominal:     Palpations: Abdomen is soft.     Tenderness: There is no abdominal tenderness.  Musculoskeletal:     Right lower leg: Swelling present.     Left lower leg: Swelling present.  Skin:    General: Skin is warm.     Findings: No rash.  Neurological:     Mental Status: He is alert and oriented to person, place, and time.     Comments: Barely able to straight leg raise     Data Reviewed: Na 133, ast 51, alt 50, wbc 14.3, hb 9.6  Family Communication: spoke with wife at bedside  Disposition: Status is: Observation Trying to set up [facility] and equipment  Planned Discharge Destination: Home with Home Health {Tip this will not be part of the note when signed  DVT Prophylaxis  ., Scds  Enoxaparin (lovenox) injection 40 mg  (Optional):26781}   Time spent: 28  minutes  Author: Loletha Grayer, MD 04/09/2022 3:54 PM  For on call review [email or web address].

## 2022-04-09 NOTE — Consult Note (Signed)
Hematology/Oncology Consult note [facility] [phone] [phone]  Patient Care Team: Sindy Guadeloupe, MD as PCP - General (Oncology) Telford Nab, RN as Oncology Nurse Navigator Leim Fabry, MD as Consulting Physician (Orthopedic Surgery)   Name of the patient: Maurice Simpson  [id number]  [birth year]    Reason for referral- ***   Referring physician- ***  Date of visit: @TODAY @   History of presenting illness- ***  ECOG PS- ***  Pain scale- ***   Review of systems- ROS  Allergies  Allergen Reactions   Bee Venom Anaphylaxis    Patient Active Problem List   Diagnosis Date Noted   Myocardial injury 04/09/2022   Weakness 04/08/2022   Pressure injury of skin 04/08/2022   Malnutrition of moderate degree 11/27/2021   Generalized weakness 11/26/2021   Lobar pneumonia ([place]) 11/25/2021   Weakness generalized 11/25/2021   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   Hypokalemia due to inadequate potassium intake 11/03/2021   Encounter for antineoplastic chemotherapy 08/25/2021   Lung cancer metastatic to brain ([place]) 08/25/2021   Thrush 08/25/2021   Palliative care encounter    Febrile neutropenia ([place]) 08/12/2021   Thrombocytopenia ([place]) 08/12/2021   Small cell lung cancer ([place]) 07/21/2021   Weight loss, abnormal 06/24/2021   Cardiovascular risk factor 05/12/2021   Bee sting allergy 05/12/2021   Obesity 07/20/2019   Nicotine dependence, cigarettes, uncomplicated [id number]   Family history of colon cancer 07/20/2019   COPD (chronic obstructive pulmonary disease) ([place]) 07/20/2019   Preventative health care 07/20/2019     Past Medical History:  Diagnosis Date   Cancer ([place])    COPD (chronic obstructive pulmonary disease) ([place])    Dyspnea      Past Surgical History:  Procedure Laterality Date   CATARACT EXTRACTION Right 03/10/2021   IR IMAGING GUIDED PORT INSERTION  07/27/2021    Social History   Socioeconomic History    Marital status: Married    Spouse name: Not on file   Number of children: Not on file   Years of education: Not on file   Highest education level: Not on file  Occupational History   Not on file  Tobacco Use   Smoking status: Former    Packs/day: 2.00    Years: 51.00    Additional pack years: 0.00    Total pack years: 102.00    Types: Cigarettes    Quit date: 11/07/2021    Years since quitting: 0.4   Smokeless tobacco: Never  Vaping Use   Vaping Use: Never used  Substance and Sexual Activity   Alcohol use: Not Currently   Drug use: Never   Sexual activity: Yes  Other Topics Concern   Not on file  Social History Narrative   Not on file   Social Determinants of Health   Financial Resource Strain: Low Risk  (07/13/2021)   Overall Financial Resource Strain (CARDIA)    Difficulty of Paying Living Expenses: Not hard at all  Food Insecurity: Food Insecurity Present (04/08/2022)   Hunger Vital Sign    Worried About Estate manager/land agent of Food in the Last Year: Often true    Ran Out of Food in the Last Year: Often true  Transportation Needs: Unmet Transportation Needs (04/08/2022)   PRAPARE - Transportation    Lack of Transportation (Medical): No    Lack of Transportation (Non-Medical): Yes  Physical Activity: Inactive (07/20/2019)   Exercise Vital Sign    Days of Exercise per Week: 0 days  Minutes of Exercise per Session: 0 min  Stress: Stress Concern Present (07/13/2021)   [place]    Feeling of Stress : To some extent  Social Connections: Moderately Isolated (07/13/2021)   Social Connection and Isolation Panel [NHANES]    Frequency of Communication with Friends and Family: More than three times a week    Frequency of Social Gatherings with Friends and Family: Twice a week    Attends Religious Services: Never    Marine scientist or Organizations: No    Attends Archivist Meetings: Never    Marital  Status: Married  Human resources officer Violence: Not At Risk (04/08/2022)   Humiliation, Afraid, Rape, and Kick questionnaire    Fear of Current or Ex-Partner: No    Emotionally Abused: No    Physically Abused: No    Sexually Abused: No     Family History  Problem Relation Age of Onset   Diabetes Mother    Hypertension Mother    Diabetes Father    Hypertension Father    Diabetes Sister    Cancer - Colon Cousin      Current Facility-Administered Medications:    albuterol (PROVENTIL) (2.5 MG/3ML) 0.083% nebulizer solution 2.5 mg, 2.5 mg, Nebulization, Q6H PRN, Leslye Peer, Richard, MD   alum & mag hydroxide-simeth (MAALOX/MYLANTA) 200-200-20 MG/5ML suspension 30 mL, 30 mL, Oral, Q6H PRN, Leslye Peer, Richard, MD   azithromycin (ZITHROMAX) 500 mg in sodium chloride 0.9 % 250 mL IVPB, 500 mg, Intravenous, Q24H, Deneise Lever, MD, Last Rate: 250 mL/hr at 04/08/22 2120, 500 mg at 04/08/22 2120   budesonide (PULMICORT) nebulizer solution 0.25 mg, 0.25 mg, Nebulization, BID PRN, Leslye Peer, Richard, MD   cefTRIAXone (ROCEPHIN) 2 g in sodium chloride 0.9 % 100 mL IVPB, 2 g, Intravenous, Q24H, Deneise Lever, MD, Last Rate: 200 mL/hr at 04/08/22 2038, 2 g at 04/08/22 2038   Chlorhexidine Gluconate Cloth 2 % PADS 6 each, 6 each, Topical, Daily, Loletha Grayer, MD, 6 each at 04/09/22 0859   docusate sodium (COLACE) capsule 100 mg, 100 mg, Oral, Daily PRN, Leslye Peer, Richard, MD   enoxaparin (LOVENOX) injection 40 mg, 40 mg, Subcutaneous, Q24H, Deneise Lever, MD, 40 mg at 04/08/22 2117   feeding supplement (GLUCERNA SHAKE) (GLUCERNA SHAKE) liquid 237 mL, 237 mL, Oral, QID, Wieting, Richard, MD, 237 mL at 04/09/22 1529   ondansetron (ZOFRAN) tablet 4 mg, 4 mg, Oral, Q6H PRN **OR** ondansetron (ZOFRAN) injection 4 mg, 4 mg, Intravenous, Q6H PRN, Deneise Lever, MD   oxyCODONE (Oxy IR/ROXICODONE) immediate release tablet 5 mg, 5 mg, Oral, Q4H PRN, Loletha Grayer, MD, 5 mg at 04/09/22 1227   pantoprazole  (PROTONIX) EC tablet 40 mg, 40 mg, Oral, Daily, Loletha Grayer, MD, 40 mg at 04/09/22 0856   Physical exam:  Vitals:   04/08/22 2329 04/09/22 0552 04/09/22 0741 04/09/22 1626  BP: 97/61 105/69 99/60 90/63   Pulse: 93 88 83 88  Resp: 20 17 18 18   Temp: 98.7 F (37.1 C) 98.1 F (36.7 C) 98.3 F (36.8 C) (!) 97.5 F (36.4 C)  TempSrc:      SpO2: 95% 98% 94% 97%  Weight:      Height:       Physical Exam        Latest Ref Rng & Units 04/09/2022    7:27 AM  CMP  Glucose 70 - 99 mg/dL 119   BUN 8 - 23 mg/dL 29   Creatinine  0.61 - 1.24 mg/dL 0.71   Sodium 135 - 145 mmol/L 133   Potassium 3.5 - 5.1 mmol/L 3.5   Chloride 98 - 111 mmol/L 100   CO2 22 - 32 mmol/L 24   Calcium 8.9 - 10.3 mg/dL 9.1   Total Protein 6.5 - 8.1 g/dL 7.4   Total Bilirubin 0.3 - 1.2 mg/dL 0.5   Alkaline Phos 38 - 126 U/L 123   AST 15 - 41 U/L 51   ALT 0 - 44 U/L 50       Latest Ref Rng & Units 04/09/2022    7:27 AM  CBC  WBC 4.0 - 10.5 K/uL 14.3   Hemoglobin 13.0 - 17.0 g/dL 9.6   Hematocrit 39.0 - 52.0 % 28.7   Platelets 150 - 400 K/uL 217     @IMAGES @  CT CHEST ABDOMEN PELVIS W CONTRAST  Result Date: 04/08/2022 CLINICAL DATA:  Lung cancer. Weakness. Brain neoplasm. * Tracking Code: BO * EXAM: CT CHEST, ABDOMEN, AND PELVIS WITH CONTRAST TECHNIQUE: Multidetector CT imaging of the chest, abdomen and pelvis was performed following the standard protocol during bolus administration of intravenous contrast. RADIATION DOSE REDUCTION: This exam was performed according to the departmental dose-optimization program which includes automated exposure control, adjustment of the mA and/or kV according to patient size and/or use of iterative reconstruction technique. CONTRAST:  1106mL OMNIPAQUE IOHEXOL 300 MG/ML  SOLN COMPARISON:  Chest CT scan 11/25/2021 and older. Chest abdomen pelvis CT 09/18/2021. FINDINGS: CT CHEST FINDINGS Cardiovascular: Right upper chest port. The port is accessed. No significant  pericardial effusion. The heart is nonenlarged. Coronary artery calcifications are seen. The thoracic aorta has a normal course and caliber with mild atherosclerotic calcified plaque. Pulsation artifact along the ascending aorta. Mediastinum/Nodes: Preserved thyroid gland. Normal caliber thoracic esophagus. No specific abnormal lymph node enlargement seen in the axillary region or left hilum. Enlarged lymph nodes seen which is new posterior to the carina right of midline on series 2, image 23 measuring 15 by 12 mm. There are some additional small nodes along the lower posterior mediastinum to the left of the esophagus which are not pathologic but slightly larger today as well. Lungs/Pleura: Breathing motion. The left lung has some reticular changes. No consolidation or pneumothorax. Underlying centrilobular emphysematous changes bilaterally. There is a new tiny right effusion. The previously seen mass centered in the right lung hilum inferiorly has significantly increased in size. There is more extension into the hilum and right side of the mediastinum. There is occlusion of the bronchus intermedius and right lower lobe bronchus with drowned lung appearance peripherally in the right lower lobe with volume loss. There is also poor visualization of the associated right-sided pulmonary veins. Previous dimension of the mass in the hilum had measured 3.1 x 3.3 cm previously. Today the mass lesion would have dimensions approaching 7.5 by 6.3 cm. There are increasing reticular changes as well in the inferior aspect of the middle lobe. There has likely a component of abnormal nodes in the hilum which is confluence with a mass. Musculoskeletal: Scattered degenerative changes of the spine. Gynecomastia. CT ABDOMEN PELVIS FINDINGS Hepatobiliary: Fatty liver infiltration. No space-occupying liver lesion. Patent portal vein. Gallstone in the nondilated gallbladder. Pancreas: There is new mass along the pancreatic head measuring  on series 2, image 67 at 3.9 by 2.7 cm. There is also a lesion in the tail of the pancreas at the splenic hilum measuring 2.4 by 2.3 cm. Also likely third smaller lesion on image 62  of series 2. Spleen: Normal in size without focal abnormality. Adrenals/Urinary Tract: Increasing bilateral adrenal masses. Right-sided lesion previously measured 5.7 x 4.3 cm and today on series 2, image 62 6.3 by 4.6 cm. Left-sided lesion measured 3.5 x 2.9 cm previously and today on series 2, image 61 5.8 by 4.9 cm. Second lesion just caudal along the adrenal gland is now seen as well. No enhancing renal mass or collecting system dilatation. The ureters have normal course and caliber down to the bladder. Preserved contours of the urinary bladder with a left-sided bladder diverticula. Stomach/Bowel: The large bowel is nondilated. Diffuse colonic stool. Significant stool along the right side of the colon. Normal appendix. Stomach is nondilated. The small bowel itself is nondilated. Vascular/Lymphatic: Normal caliber aorta and IVC with scattered vascular calcifications. However there are developing abnormal lymph nodes identified in the abdomen. This includes periceliac location with lymph node measuring 5.1 by 3.9 cm. Series 2, image 60. Left para-aortic nodes. Example series 2, image 72 measuring 2.5 2.3 cm. Least 2 adjacent nodes are seen as well in addition. Reproductive: Prostate is unremarkable. Other: Mesenteric metastatic disease is also again seen as on previous but significantly progressed. The lesion in the anterior mesentery which measured 4.9 x 2.6 cm on the previous examination, today is a slightly different location and likely related to tethering with the adjacent wall of the transverse colon and is measured on series 2, image 82 at 5.9 by 6.2 cm. The right-sided mid mesentery lesion which had measured 3.1 x 1.9 cm, today is seen on image 97 measuring 4.9 by 3.9 cm. There is a lesion retrorectal in the low pelvis which is  new on series 2, image 116 measuring 3.7 by 2.6 cm. Slight mass effect along the adjacent bowel. Trace ascites identified. No free air. Musculoskeletal: Slight curvature of the lumbar spine. Scattered degenerative changes of the spine and pelvis. IMPRESSION: Overall significant interval progression of disease. Increased size of the right hilar lung mass with occlusion of the adjacent bronchus, drowned lung appearance of the right lower lobe. This also more invasion of the right hilum with poor visualization of the right-sided pulmonary veins. Increasing lymph nodes in the mediastinum, upper abdomen and retroperitoneum. Development of pancreatic metastatic lesions. Increasing bilateral adrenal masses in size and number. Increasing extent and distribution of peritoneal metastatic disease. Electronically Signed   By: Jill Side M.D.   On: 04/08/2022 14:51   DG Chest Port 1 View  Result Date: 04/08/2022 CLINICAL DATA:  Chest pain EXAM: PORTABLE CHEST 1 VIEW COMPARISON:  11/25/2021 FINDINGS: Moderate right-sided pleural effusion and right basilar volume loss or consolidation. Pulmonary vascular congestion. No pneumothorax. Right-sided Port-A-Cath tip overlies RA/SVC junction. IMPRESSION: Right base consolidation and moderate effusion. Pulmonary vascular congestion. Electronically Signed   By: Sammie Bench M.D.   On: 04/08/2022 12:45   DG Knee 1-2 Views Left  Result Date: 03/30/2022 CLINICAL DATA:  Left knee pain. EXAM: LEFT KNEE - 1-2 VIEW COMPARISON:  None Available. FINDINGS: Two views. No evidence of fracture, dislocation, or joint effusion. Quadriceps tendon enthesopathy along superior patella. Small exostosis along the medial aspect of the right medial femoral condyle. Soft tissues are unremarkable. IMPRESSION: No acute fracture, dislocation or joint effusion. Quadriceps tendon enthesopathy. Electronically Signed   By: Emmit Alexanders M.D.   On: 03/30/2022 16:18    Assessment and plan- Patient is a  67 y.o. male ***   Thank you for this kind referral and the opportunity to participate in the  care of this  Patient   Visit Diagnosis 1. Lung infiltrate   2. Weakness   3. Generalized weakness   4. Elevated troponin     Dr. Randa Evens, MD, MPH [facility] [id number] 04/09/2022

## 2022-04-10 DIAGNOSIS — C7971 Secondary malignant neoplasm of right adrenal gland: Secondary | ICD-10-CM | POA: Diagnosis present

## 2022-04-10 DIAGNOSIS — Z6822 Body mass index (BMI) 22.0-22.9, adult: Secondary | ICD-10-CM | POA: Diagnosis not present

## 2022-04-10 DIAGNOSIS — C7931 Secondary malignant neoplasm of brain: Secondary | ICD-10-CM | POA: Diagnosis present

## 2022-04-10 DIAGNOSIS — J181 Lobar pneumonia, unspecified organism: Secondary | ICD-10-CM | POA: Diagnosis present

## 2022-04-10 DIAGNOSIS — E440 Moderate protein-calorie malnutrition: Secondary | ICD-10-CM | POA: Diagnosis present

## 2022-04-10 DIAGNOSIS — Z87891 Personal history of nicotine dependence: Secondary | ICD-10-CM | POA: Diagnosis not present

## 2022-04-10 DIAGNOSIS — R296 Repeated falls: Secondary | ICD-10-CM | POA: Diagnosis present

## 2022-04-10 DIAGNOSIS — C3401 Malignant neoplasm of right main bronchus: Secondary | ICD-10-CM | POA: Diagnosis present

## 2022-04-10 DIAGNOSIS — R7989 Other specified abnormal findings of blood chemistry: Secondary | ICD-10-CM | POA: Diagnosis present

## 2022-04-10 DIAGNOSIS — R7401 Elevation of levels of liver transaminase levels: Secondary | ICD-10-CM | POA: Insufficient documentation

## 2022-04-10 DIAGNOSIS — E871 Hypo-osmolality and hyponatremia: Secondary | ICD-10-CM | POA: Diagnosis present

## 2022-04-10 DIAGNOSIS — C7972 Secondary malignant neoplasm of left adrenal gland: Secondary | ICD-10-CM | POA: Diagnosis present

## 2022-04-10 DIAGNOSIS — C786 Secondary malignant neoplasm of retroperitoneum and peritoneum: Secondary | ICD-10-CM | POA: Diagnosis present

## 2022-04-10 DIAGNOSIS — R64 Cachexia: Secondary | ICD-10-CM | POA: Diagnosis present

## 2022-04-10 DIAGNOSIS — J440 Chronic obstructive pulmonary disease with acute lower respiratory infection: Secondary | ICD-10-CM | POA: Diagnosis present

## 2022-04-10 DIAGNOSIS — C772 Secondary and unspecified malignant neoplasm of intra-abdominal lymph nodes: Secondary | ICD-10-CM | POA: Diagnosis present

## 2022-04-10 DIAGNOSIS — J449 Chronic obstructive pulmonary disease, unspecified: Secondary | ICD-10-CM | POA: Diagnosis not present

## 2022-04-10 DIAGNOSIS — C771 Secondary and unspecified malignant neoplasm of intrathoracic lymph nodes: Secondary | ICD-10-CM | POA: Diagnosis present

## 2022-04-10 DIAGNOSIS — J90 Pleural effusion, not elsewhere classified: Secondary | ICD-10-CM | POA: Diagnosis present

## 2022-04-10 DIAGNOSIS — D638 Anemia in other chronic diseases classified elsewhere: Secondary | ICD-10-CM | POA: Insufficient documentation

## 2022-04-10 DIAGNOSIS — R627 Adult failure to thrive: Secondary | ICD-10-CM | POA: Diagnosis present

## 2022-04-10 DIAGNOSIS — Z993 Dependence on wheelchair: Secondary | ICD-10-CM | POA: Diagnosis not present

## 2022-04-10 DIAGNOSIS — R531 Weakness: Secondary | ICD-10-CM | POA: Diagnosis present

## 2022-04-10 DIAGNOSIS — L89151 Pressure ulcer of sacral region, stage 1: Secondary | ICD-10-CM | POA: Diagnosis present

## 2022-04-10 DIAGNOSIS — M549 Dorsalgia, unspecified: Secondary | ICD-10-CM | POA: Diagnosis present

## 2022-04-10 MED ORDER — PANTOPRAZOLE SODIUM 40 MG PO TBEC: 40.0000 mg | DELAYED_RELEASE_TABLET | Freq: Every day | ORAL | 0 refills | Status: DC

## 2022-04-10 MED ORDER — AZITHROMYCIN 250 MG PO TABS: ORAL_TABLET | ORAL | 0 refills | Status: DC

## 2022-04-10 MED ORDER — HEPARIN SOD (PORK) LOCK FLUSH 100 UNIT/ML IV SOLN
500.0000 [IU] | Freq: Once | INTRAVENOUS | Status: AC
  Administered 2022-04-10: 500 [IU] via INTRAVENOUS
  Filled 2022-04-10: qty 5

## 2022-04-10 MED ORDER — CEFDINIR 300 MG PO CAPS: 300.0000 mg | ORAL_CAPSULE | Freq: Two times a day (BID) | ORAL | 0 refills | Status: AC

## 2022-04-10 MED ORDER — POLYETHYLENE GLYCOL 3350 17 G PO PACK: 17.0000 g | PACK | Freq: Every day | ORAL | 0 refills | Status: DC | PRN

## 2022-04-10 NOTE — Evaluation (Signed)
Occupational Therapy Evaluation Patient Details Name: Maurice Simpson MRN: [id number] DOB: Jan 30, 1955 Today's Date: 04/10/2022   History of Present Illness 67 y.o. 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  Patient with noted baseline metastatic lung cancer. Brought to ED for overall worsening weakness with significant difficulty with ambulation as well as overall decreased p.o. intake.   Clinical Impression   Maurice Simpson was seen for OT/PT co-evaluation this date. Prior to hospital admission, pt was recently requiring +2 assist, prior to ~2 weeks pt was limited mobility with +1. Pt lives with spouse. Pt presents to acute OT demonstrating impaired ADL performance and functional mobility 2/2 functional strength/ROM/balance deficits. Pt currently requires MAX A x2 for bed<>BSC t/f. MAX A rolling bed level for bed pan placement. Poor dynamic sitting balance. Pt would benefit from skilled OT to address noted impairments and functional limitations (see below for any additional details). Upon hospital discharge, recommend x2 assistance for all mobility and follow up therapy.   Recommendations for follow up therapy are one component of a multi-disciplinary discharge planning process, led by the attending physician.  Recommendations may be updated based on patient status, additional functional criteria and insurance authorization.   Assistance Recommended at Discharge Frequent or constant Supervision/Assistance  Patient can return home with the following Two people to help with walking and/or transfers;Two people to help with bathing/dressing/bathroom;Help with stairs or ramp for entrance    Functional Status Assessment  Patient has had a recent decline in their functional status and demonstrates the ability to make significant improvements in function in a reasonable and  predictable amount of time.  Equipment Recommendations  Hospital bed    Recommendations for Other Services       Precautions / Restrictions Precautions Precautions: Fall Restrictions Weight Bearing Restrictions: No      Mobility Bed Mobility Overal bed mobility: Needs Assistance Bed Mobility: Supine to Sit, Sit to Supine     Supine to sit: Mod assist, +2 for physical assistance Sit to supine: Mod assist        Transfers Overall transfer level: Needs assistance Equipment used: 2 person hand held assist Transfers: Bed to chair/wheelchair/BSC   Stand pivot transfers: Max assist, +2 physical assistance                Balance Overall balance assessment: Needs assistance Sitting-balance support: Feet supported, Bilateral upper extremity supported Sitting balance-Leahy Scale: Fair     Standing balance support: Bilateral upper extremity supported Standing balance-Leahy Scale: Zero                             ADL either performed or assessed with clinical judgement   ADL Overall ADL's : Needs assistance/impaired                                       General ADL Comments: MAX A x2 for BSC t/f. MAX A rolling bed level for bed pan placement. MAX A don B socks seated EOB      Pertinent Vitals/Pain Pain Assessment Pain Assessment: Faces Faces Pain Scale: Hurts little more Pain Location: LLE with PROM Pain Descriptors / Indicators: Discomfort, Dull, Grimacing Pain Intervention(s): Limited activity within patient's tolerance, Repositioned     Hand Dominance Right  Extremity/Trunk Assessment Upper Extremity Assessment Upper Extremity Assessment: Generalized weakness   Lower Extremity Assessment Lower Extremity Assessment: Defer to PT evaluation       Communication Communication Communication: No difficulties   Cognition Arousal/Alertness: Awake/alert Behavior During Therapy: WFL for tasks assessed/performed Overall  Cognitive Status: Impaired/Different from baseline Area of Impairment: Memory                     Memory: Decreased short-term memory                          Home Living Family/patient expects to be discharged to:: Private residence Living Arrangements: Spouse/significant other;Children Available Help at Discharge: Family;Available 24 hours/day Type of Home: House Home Access: Stairs to enter [employer] of Steps: 5 Entrance Stairs-Rails: Right;Left Home Layout: One level     Bathroom Shower/Tub: Occupational psychologist: Standard     Home Equipment: Shower seat - built Medical sales representative (2 wheels);Rollator (4 wheels);Cane - single point          Prior Functioning/Environment Prior Level of Function : Needs assist             Mobility Comments: reports 1 falls ~2 weeks ago using SPC, Required +2 assist last ~1 week for [facility] t/fs          OT Problem List: Decreased range of motion;Decreased strength;Decreased activity tolerance;Impaired balance (sitting and/or standing);Decreased safety awareness      OT Treatment/Interventions: Self-care/ADL training;Therapeutic exercise;Energy conservation;DME and/or AE instruction;Therapeutic activities;Patient/family education;Balance training    OT Goals(Current goals can be found in the care plan section) Acute Rehab OT Goals Patient Stated Goal: to get out of bed and sit up OT Goal Formulation: With patient/family Time For Goal Achievement: 04/24/22 Potential to Achieve Goals: Fair ADL Goals Pt Will Perform Grooming: with set-up;with supervision;sitting Pt Will Perform Lower Body Dressing: with min assist;bed level Pt Will Transfer to Toilet: with mod assist;squat pivot transfer;bedside commode  OT Frequency: Min 2X/week    Co-evaluation PT/OT/SLP Co-Evaluation/Treatment: Yes Reason for Co-Treatment: For patient/therapist safety;To address functional/ADL transfers PT goals  addressed during session: Mobility/safety with mobility OT goals addressed during session: ADL's and self-care      AM-PAC OT "6 Clicks" Daily Activity     Outcome Measure Help from another person eating meals?: None Help from another person taking care of personal grooming?: A Little Help from another person toileting, which includes using toliet, bedpan, or urinal?: A Lot Help from another person bathing (including washing, rinsing, drying)?: A Lot Help from another person to put on and taking off regular upper body clothing?: A Little Help from another person to put on and taking off regular lower body clothing?: A Lot 6 Click Score: 16   End of Session    Activity Tolerance: Patient tolerated treatment well Patient left: in bed;with call bell/phone within reach;with nursing/sitter in room;with family/visitor present  OT Visit Diagnosis: Other abnormalities of gait and mobility (R26.89);Muscle weakness (generalized) (M62.81)                Time: [id number] OT Time Calculation (min): 29 min Charges:  OT General Charges $OT Visit: 1 Visit OT Evaluation $OT Eval Moderate Complexity: 1 Mod OT Treatments $Self Care/Home Management : 8-22 mins  Dessie Coma, M.S. OTR/L  04/10/22, 10:31 AM  ascom [phone]

## 2022-04-10 NOTE — Evaluation (Signed)
Physical Therapy Co-Evaluation with OT Patient Details Name: Maurice Simpson MRN: [id number] DOB: 10-May-1955 Today's Date: 04/10/2022  History of Present Illness  67 y.o. 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  Patient with noted baseline metastatic lung cancer. Brought to ED for overall worsening weakness with significant difficulty with ambulation as well as overall decreased p.o. intake.  Clinical Impression  67 yo Male tolerated evaluation fair. He continued to feel the urge to have a BM. He was using the bedpan when PT/OT came into room. He requested assistance to transfer to bedside commode. Patient required mod A +2 for bed mobility. He exhibits increased weakness in LLE (grossly 2/5) more than RLE (grossly 3-/5). Patient also reports more numbness in LLE compared to RLE. He exhibits tightness in BLE being unable to achieve knee extension to neutral with passive ROM. He reports increased weakness in LE over the last week or so. PT/OT provided instruction and assistance for stand pivot transfer to bedside commode. Patient required max A +2. Once standing, he was unable to achieve full upright posture due to weakness and tightness in BLE. He was unable to have a BM and requested transfer back to bed. Once in bed he continued to report feeling urge and requested bed pan. PT/OT spoke with patient and his wife. They want him to go home but agree he will require additional care. Patient would benefit from additional skilled PT intervention to improve strength, balance and gait safety;      Recommendations for follow up therapy are one component of a multi-disciplinary discharge planning process, led by the attending physician.  Recommendations may be updated based on patient status, additional functional criteria and insurance authorization.  Follow Up Recommendations        Assistance Recommended at Discharge Frequent or constant Supervision/Assistance  Patient can return home with the following  Two people to help with walking and/or transfers;A lot of help with bathing/dressing/bathroom;Assistance with cooking/housework;Direct supervision/assist for medications management;Direct supervision/assist for financial management;Assist for transportation;Help with stairs or ramp for entrance    Equipment Recommendations Other (comment) (defer to post acute setting;)  Recommendations for Other Services       Functional Status Assessment Patient has had a recent decline in their functional status and/or demonstrates limited ability to make significant improvements in function in a reasonable and predictable amount of time     Precautions / Restrictions Precautions Precautions: Fall Restrictions Weight Bearing Restrictions: No      Mobility  Bed Mobility Overal bed mobility: Needs Assistance Bed Mobility: Supine to Sit, Sit to Supine     Supine to sit: Mod assist, +2 for physical assistance Sit to supine: Mod assist   General bed mobility comments: elevated head of bed, requires extra time and effort;    Transfers Overall transfer level: Needs assistance Equipment used: 2 person hand held assist Transfers: Bed to chair/wheelchair/BSC   Stand pivot transfers: Max assist, +2 physical assistance         General transfer comment: Requires cues for hand placement; x2 reps, had a harder time transferring from bedside commode back to bed;    Ambulation/Gait               General Gait Details: unable at this time;  Stairs            Wheelchair Mobility    Modified Rankin (Stroke Patients Only)  Balance Overall balance assessment: Needs assistance Sitting-balance support: Feet supported, Bilateral upper extremity supported Sitting balance-Leahy Scale: Fair     Standing balance support: Bilateral upper extremity  supported Standing balance-Leahy Scale: Zero Standing balance comment: Unable to achieve full upright standing, requiring max A +2                             Pertinent Vitals/Pain Pain Assessment Pain Assessment: Faces Faces Pain Scale: Hurts little more Pain Location: LLE with PROM Pain Descriptors / Indicators: Discomfort, Dull, Grimacing Pain Intervention(s): Limited activity within patient's tolerance, Repositioned    Home Living Family/patient expects to be discharged to:: Private residence Living Arrangements: Spouse/significant other;Children Available Help at Discharge: Family;Available 24 hours/day Type of Home: House Home Access: Stairs to enter Entrance Stairs-Rails: Psychiatric nurse of Steps: 5   Home Layout: One level Home Equipment: Shower seat - built Medical sales representative (2 wheels);Rollator (4 wheels);Cane - single point      Prior Function Prior Level of Function : Needs assist       Physical Assist : Mobility (physical) Mobility (physical): Transfers;Gait;Stairs   Mobility Comments: reports 1 falls ~2 weeks ago using SPC, Required +2 assist last ~1 week for [facility] t/fs       Hand Dominance   Dominant Hand: Right    Extremity/Trunk Assessment   Upper Extremity Assessment Upper Extremity Assessment: Defer to OT evaluation    Lower Extremity Assessment Lower Extremity Assessment: Generalized weakness;RLE deficits/detail;LLE deficits/detail RLE Deficits / Details: grossly 3-/5 LLE Deficits / Details: grossly 2/5 LLE Sensation: decreased light touch       Communication   Communication: No difficulties  Cognition Arousal/Alertness: Awake/alert Behavior During Therapy: WFL for tasks assessed/performed Overall Cognitive Status: Impaired/Different from baseline Area of Impairment: Memory                     Memory: Decreased short-term memory                  General Comments      Exercises      Assessment/Plan    PT Assessment Patient needs continued PT services  PT Problem List Decreased strength;Decreased mobility;Decreased range of motion;Decreased activity tolerance;Decreased balance       PT Treatment Interventions Therapeutic exercise;DME instruction;Gait training;Balance training;Stair training;Neuromuscular re-education;Functional mobility training;Therapeutic activities;Patient/family education    PT Goals (Current goals can be found in the Care Plan section)  Acute Rehab PT Goals Patient Stated Goal: "Go home" PT Goal Formulation: With patient Time For Goal Achievement: 04/24/22 Potential to Achieve Goals: Good    Frequency Min 2X/week     Co-evaluation   Reason for Co-Treatment: For patient/therapist safety;To address functional/ADL transfers PT goals addressed during session: Mobility/safety with mobility OT goals addressed during session: ADL's and self-care       AM-PAC PT "6 Clicks" Mobility  Outcome Measure Help needed turning from your back to your side while in a flat bed without using bedrails?: A Lot Help needed moving from lying on your back to sitting on the side of a flat bed without using bedrails?: A Lot Help needed moving to and from a bed to a chair (including a wheelchair)?: A Lot Help needed standing up from a chair using your arms (e.g., wheelchair or bedside chair)?: A Lot Help needed to walk in hospital room?: Total Help needed climbing 3-5 steps with a railing? : Total 6 Click Score: 10  End of Session Equipment Utilized During Treatment: Gait belt Activity Tolerance: Patient limited by fatigue Patient left: in bed;with call bell/phone within reach;with bed alarm set;with family/visitor present Nurse Communication: Mobility status PT Visit Diagnosis: Other abnormalities of gait and mobility (R26.89);Muscle weakness (generalized) (M62.81);History of falling (Z91.81)    Time: [id number] PT Time Calculation (min) (ACUTE  ONLY): 34 min   Charges:   PT Evaluation $PT Eval Low Complexity: 1 Low           Valente Fosberg PT, DPT 04/10/2022, 2:22 PM

## 2022-04-10 NOTE — TOC Transition Note (Signed)
Transition of Care [facility]) - CM/SW Discharge Note   Patient Details  Name: Maurice Simpson MRN: [id number] Date of Birth: September 30, 1955  [facility] CM/SW Contact:  Gerilyn Pilgrim, LCSW Phone Number: 04/10/2022, 10:45 AM   Clinical Narrative:   Pt to discharge today. Family decided against hospice but did request [place] services. Wife is now requesting hospital bed as well. CSW ordered through [employer]. Merleen Nicely with [facility] accepted [facility] referral for RN/AID. Merleen Nicely with [facility] notified of discharge. CSW will arrange [facility] transport once pt ready to discharge.      Patient Goals and CMS Choice      Discharge Placement                         Discharge Plan and Services Additional resources added to the After Visit Summary for                                       Social Determinants of Health (SDOH) Interventions SDOH Screenings   Food Insecurity: Food Insecurity Present (04/08/2022)  Housing: Medium Risk (04/08/2022)  Transportation Needs: Unmet Transportation Needs (04/08/2022)  Utilities: At Risk (04/08/2022)  Alcohol Screen: Low Risk  (07/13/2021)  Depression (PHQ2-9): High Risk (07/13/2021)  Financial Resource Strain: Low Risk  (07/13/2021)  Physical Activity: Inactive (07/20/2019)  Social Connections: Moderately Isolated (07/13/2021)  Stress: Stress Concern Present (07/13/2021)  Tobacco Use: Medium Risk (04/08/2022)     Readmission Risk Interventions    11/27/2021   11:30 AM  Readmission Risk Prevention Plan  Transportation Screening Complete  PCP or Specialist Appt within 3-5 Days Complete  HRI or [place] Complete  Social Work Consult for [place] Planning/Counseling Complete  Palliative Care Screening Not Applicable  Medication Review Press photographer) Complete

## 2022-04-10 NOTE — Assessment & Plan Note (Signed)
Slight elevation of AST at 51 and ALT at 50.

## 2022-04-10 NOTE — Progress Notes (Signed)
Patient discharged to home via EMS. Patient's belongings were sent home with his wife per patient report. All patient questions were addressed. Patient safely transported off the unit via EMS.

## 2022-04-10 NOTE — Assessment & Plan Note (Signed)
Last hemoglobin 9.6.

## 2022-04-10 NOTE — Plan of Care (Signed)

## 2022-04-10 NOTE — Discharge Summary (Signed)
Physician Discharge Summary   Patient: Maurice Simpson MRN: [id number] DOB: March 05, 1955  Admit date:     04/08/2022  Discharge date: 04/10/22  Discharge Physician: Loletha Grayer   PCP: Sindy Guadeloupe, MD   Recommendations at discharge:   Follow-up with oncology Dr. 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  Patient with noted baseline metastatic lung cancer.  No longer on treatment as of November last year.  Per the wife, patient overall worsening weakness with significant difficulty with ambulation as well as overall decreased p.o. intake.  Noted admission back in November for similar symptoms.  Was treated for lobar pneumonia at that point as well as formal evaluation with palliative care.  Wife declined palliative intervention at that time.  Patient has had overall decline since this point.  Mild cough.  No reported shortness of breath.  No reported vomiting or diarrhea. Presented to the ER afebrile, hemodynamically stable.  White count 13.8, hemoglobin 11.6.  Troponin 20s.  EKG stable.  Initial chest x-ray with right base consolidation and moderate effusion.  CT chest with increased right hilar lung mass with occlusion of the adjacent bronchus withdrawal and appearance of the right lower lobe as well as more invasion of the right hilum and increasing lymph nodes in the mediastinum upper abdomen and retroperitoneum as well as development of pancreatic  metastatic lesions.  Also with increasing adrenal masses in size and number.  Increase in extent and distribution of peritoneal metastatic disease.  Palliative care also consulted at the bedside. Dr. Janese Banks w/ oncology also aware of case.  3/29.  They declined hospice services and would like more help at home.  Toc to help arrange. 3/30.  Patient and family agreeable to a hospital bed at home.  This will be delivered this evening.  Patient will be transferred home by EMS.  The family does have hospice services phone number if the patient declines and they wish to pursue hospice.  Family interested in home health where they have more help at home taking care of him.  Full code.  Assessment and Plan: * Weakness Progressive worsening weakness in the setting of metastatic lung disease.   Appreciate PT and OT consultations.  Will set up [facility] aide, PT, OT and RN.  Hospital bed ordered and will be delivered this evening.   Lobar pneumonia ([place]) Postobstructive in nature.  Patient given Rocephin and Zithromax here.  Will switch over to Zithromax and Omnicef upon discharge for 3 more days  COPD (chronic obstructive pulmonary disease) (HCC) Respiratory status stable   Anemia of chronic disease Last hemoglobin 9.6.  Transaminitis Slight elevation of AST at 51 and ALT at 50.  Hyponatremia Sodium a couple points less than normal range at 133.  Myocardial injury Troponin slightly elevated and then normalized.  Pressure injury of skin Present on admission see full description below  Protein-calorie malnutrition, moderate ([place]) .  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  Apreciated palliative and oncology input.  Patient full code and declined hospice services currently.  Overall prognosis is poor.   Palliative care encounter            Consultants: Palliative care, oncology, hospice liaison Procedures performed: None Disposition: Home health Diet recommendation:  Regular diet DISCHARGE MEDICATION: Allergies as of 04/10/2022       Reactions   Bee Venom Anaphylaxis        Medication List     STOP taking these medications    ibuprofen 200 MG tablet Commonly known as: ADVIL   loperamide 2 MG capsule Commonly known as: IMODIUM   nystatin 100000 UNIT/ML suspension Commonly known as: MYCOSTATIN   prochlorperazine 10 MG tablet Commonly known as: COMPAZINE       TAKE these medications    acetaminophen 325 MG tablet Commonly known as: TYLENOL Take 2 tablets (650 mg total) by mouth every 6 (six) hours as needed for mild pain, moderate pain, fever or headache.   albuterol (2.5 MG/3ML) 0.083% nebulizer solution Commonly known as: PROVENTIL Take 3 mLs (2.5 mg total) by nebulization every 6 (six) hours as needed for wheezing or shortness of breath.   azithromycin 250 MG tablet Commonly known as: Zithromax One tab po every evening for three days   budesonide 0.25 MG/2ML nebulizer solution Commonly known as: Pulmicort Take 2 mLs (0.25 mg total) by nebulization 2 (two) times daily as needed.   cefdinir 300 MG capsule Commonly known as: OMNICEF Take 1 capsule (300 mg total) by mouth 2 (two) times daily for 3 days.   COLACE PO Take 1 capsule by mouth as needed (constipation).   feeding supplement Liqd Take 237 mLs by mouth 4 (four) times daily.   lidocaine-prilocaine cream Commonly known as: EMLA Apply 1 Application topically as needed. Apply to [facility] a cath prior to access   ondansetron 4 MG tablet Commonly known as: Zofran Take 1 tablet (4 mg total) by mouth every 8 (eight) hours as needed for nausea or vomiting.   oxyCODONE 5 MG immediate release tablet Commonly known as: Oxy IR/ROXICODONE Take 1 tablet (5 mg total) by mouth every 4 (four) hours as needed for severe pain.   pantoprazole  40 MG tablet Commonly known as: PROTONIX Take 1 tablet (40 mg total) by mouth daily. Start taking on: April 11, 2022   polyethylene glycol 17 g packet Commonly known as: MIRALAX / GLYCOLAX Take 17 g by mouth daily as needed.               Durable Medical Equipment  (From admission, onward)           Start     Ordered   04/10/22 1021  For home use only DME Hospital bed  Once       Question Answer Comment  Length of Need Lifetime   The above medical condition requires: Patient requires the ability to reposition immediately   Head must be elevated greater than: 30 degrees   Bed type Semi-electric   Support Surface: Gel Overlay      04/10/22 1021            Follow-up Information     Sindy Guadeloupe, MD Follow up in 5 day(s).   Specialty: Oncology Contact information: [address] [place] [address] [phone]  Discharge Exam: Filed Weights   04/08/22 1140  Weight: 68 kg   Physical Exam HENT:     Head: Normocephalic.     Mouth/Throat:     Pharynx: No oropharyngeal exudate.  Eyes:     General: Lids are normal.     Conjunctiva/sclera: Conjunctivae normal.  Cardiovascular:     Rate and Rhythm: Normal rate and regular rhythm.     Heart sounds: Normal heart sounds, S1 normal and S2 normal.  Pulmonary:     Breath sounds: Examination of the right-lower field reveals decreased breath sounds. Examination of the left-lower field reveals decreased breath sounds. Decreased breath sounds present. No wheezing, rhonchi or rales.  Abdominal:     Palpations: Abdomen is soft.     Tenderness: There is no abdominal tenderness.  Skin:    General: Skin is warm.     Findings: No rash.  Neurological:     Mental Status: He is alert and oriented to person, place, and time.      Condition at discharge: serious  The results of significant diagnostics from this hospitalization (including imaging, microbiology, ancillary and laboratory)  are listed below for reference.   Imaging Studies: CT CHEST ABDOMEN PELVIS W CONTRAST  Result Date: 04/08/2022 CLINICAL DATA:  Lung cancer. Weakness. Brain neoplasm. * Tracking Code: BO * EXAM: CT CHEST, ABDOMEN, AND PELVIS WITH CONTRAST TECHNIQUE: Multidetector CT imaging of the chest, abdomen and pelvis was performed following the standard protocol during bolus administration of intravenous contrast. RADIATION DOSE REDUCTION: This exam was performed according to the departmental dose-optimization program which includes automated exposure control, adjustment of the mA and/or kV according to patient size and/or use of iterative reconstruction technique. CONTRAST:  139mL OMNIPAQUE IOHEXOL 300 MG/ML  SOLN COMPARISON:  Chest CT scan 11/25/2021 and older. Chest abdomen pelvis CT 09/18/2021. FINDINGS: CT CHEST FINDINGS Cardiovascular: Right upper chest port. The port is accessed. No significant pericardial effusion. The heart is nonenlarged. Coronary artery calcifications are seen. The thoracic aorta has a normal course and caliber with mild atherosclerotic calcified plaque. Pulsation artifact along the ascending aorta. Mediastinum/Nodes: Preserved thyroid gland. Normal caliber thoracic esophagus. No specific abnormal lymph node enlargement seen in the axillary region or left hilum. Enlarged lymph nodes seen which is new posterior to the carina right of midline on series 2, image 23 measuring 15 by 12 mm. There are some additional small nodes along the lower posterior mediastinum to the left of the esophagus which are not pathologic but slightly larger today as well. Lungs/Pleura: Breathing motion. The left lung has some reticular changes. No consolidation or pneumothorax. Underlying centrilobular emphysematous changes bilaterally. There is a new tiny right effusion. The previously seen mass centered in the right lung hilum inferiorly has significantly increased in size. There is more extension into the hilum and  right side of the mediastinum. There is occlusion of the bronchus intermedius and right lower lobe bronchus with drowned lung appearance peripherally in the right lower lobe with volume loss. There is also poor visualization of the associated right-sided pulmonary veins. Previous dimension of the mass in the hilum had measured 3.1 x 3.3 cm previously. Today the mass lesion would have dimensions approaching 7.5 by 6.3 cm. There are increasing reticular changes as well in the inferior aspect of the middle lobe. There has likely a component of abnormal nodes in the hilum which is confluence with a mass. Musculoskeletal: Scattered degenerative changes of the spine. Gynecomastia. CT ABDOMEN PELVIS FINDINGS Hepatobiliary: Fatty liver infiltration. No  space-occupying liver lesion. Patent portal vein. Gallstone in the nondilated gallbladder. Pancreas: There is new mass along the pancreatic head measuring on series 2, image 67 at 3.9 by 2.7 cm. There is also a lesion in the tail of the pancreas at the splenic hilum measuring 2.4 by 2.3 cm. Also likely third smaller lesion on image 62 of series 2. Spleen: Normal in size without focal abnormality. Adrenals/Urinary Tract: Increasing bilateral adrenal masses. Right-sided lesion previously measured 5.7 x 4.3 cm and today on series 2, image 62 6.3 by 4.6 cm. Left-sided lesion measured 3.5 x 2.9 cm previously and today on series 2, image 61 5.8 by 4.9 cm. Second lesion just caudal along the adrenal gland is now seen as well. No enhancing renal mass or collecting system dilatation. The ureters have normal course and caliber down to the bladder. Preserved contours of the urinary bladder with a left-sided bladder diverticula. Stomach/Bowel: The large bowel is nondilated. Diffuse colonic stool. Significant stool along the right side of the colon. Normal appendix. Stomach is nondilated. The small bowel itself is nondilated. Vascular/Lymphatic: Normal caliber aorta and IVC with scattered  vascular calcifications. However there are developing abnormal lymph nodes identified in the abdomen. This includes periceliac location with lymph node measuring 5.1 by 3.9 cm. Series 2, image 60. Left para-aortic nodes. Example series 2, image 72 measuring 2.5 2.3 cm. Least 2 adjacent nodes are seen as well in addition. Reproductive: Prostate is unremarkable. Other: Mesenteric metastatic disease is also again seen as on previous but significantly progressed. The lesion in the anterior mesentery which measured 4.9 x 2.6 cm on the previous examination, today is a slightly different location and likely related to tethering with the adjacent wall of the transverse colon and is measured on series 2, image 82 at 5.9 by 6.2 cm. The right-sided mid mesentery lesion which had measured 3.1 x 1.9 cm, today is seen on image 97 measuring 4.9 by 3.9 cm. There is a lesion retrorectal in the low pelvis which is new on series 2, image 116 measuring 3.7 by 2.6 cm. Slight mass effect along the adjacent bowel. Trace ascites identified. No free air. Musculoskeletal: Slight curvature of the lumbar spine. Scattered degenerative changes of the spine and pelvis. IMPRESSION: Overall significant interval progression of disease. Increased size of the right hilar lung mass with occlusion of the adjacent bronchus, drowned lung appearance of the right lower lobe. This also more invasion of the right hilum with poor visualization of the right-sided pulmonary veins. Increasing lymph nodes in the mediastinum, upper abdomen and retroperitoneum. Development of pancreatic metastatic lesions. Increasing bilateral adrenal masses in size and number. Increasing extent and distribution of peritoneal metastatic disease. Electronically Signed   By: Jill Side M.D.   On: 04/08/2022 14:51   DG Chest Port 1 View  Result Date: 04/08/2022 CLINICAL DATA:  Chest pain EXAM: PORTABLE CHEST 1 VIEW COMPARISON:  11/25/2021 FINDINGS: Moderate right-sided pleural  effusion and right basilar volume loss or consolidation. Pulmonary vascular congestion. No pneumothorax. Right-sided Port-A-Cath tip overlies RA/SVC junction. IMPRESSION: Right base consolidation and moderate effusion. Pulmonary vascular congestion. Electronically Signed   By: Sammie Bench M.D.   On: 04/08/2022 12:45   DG Knee 1-2 Views Left  Result Date: 03/30/2022 CLINICAL DATA:  Left knee pain. EXAM: LEFT KNEE - 1-2 VIEW COMPARISON:  None Available. FINDINGS: Two views. No evidence of fracture, dislocation, or joint effusion. Quadriceps tendon enthesopathy along superior patella. Small exostosis along the medial aspect of the right medial  femoral condyle. Soft tissues are unremarkable. IMPRESSION: No acute fracture, dislocation or joint effusion. Quadriceps tendon enthesopathy. Electronically Signed   By: Emmit Alexanders M.D.   On: 03/30/2022 16:18    Microbiology: Results for orders placed or performed during the hospital encounter of 11/25/21  Resp Panel by RT-PCR (Flu A&B, Covid) Anterior Nasal Swab     Status: None   Collection Time: 11/25/21  4:06 PM   Specimen: Anterior Nasal Swab  Result Value Ref Range Status   SARS Coronavirus 2 by RT PCR NEGATIVE NEGATIVE Final    Comment: (NOTE) SARS-CoV-2 target nucleic acids are NOT DETECTED.  The SARS-CoV-2 RNA is generally detectable in upper respiratory specimens during the acute phase of infection. The lowest concentration of SARS-CoV-2 viral copies this assay can detect is 138 copies/mL. A negative result does not preclude SARS-Cov-2 infection and should not be used as the sole basis for treatment or other patient management decisions. A negative result may occur with  improper specimen collection/handling, submission of specimen other than nasopharyngeal swab, presence of viral mutation(s) within the areas targeted by this assay, and inadequate number of viral copies(<138 copies/mL). A negative result must be combined with clinical  observations, patient history, and epidemiological information. The expected result is Negative.  Fact Sheet for Patients:  EntrepreneurPulse.com.au  Fact Sheet for Healthcare Providers:  IncredibleEmployment.be  This test is no t yet approved or cleared by the [place] FDA and  has been authorized for detection and/or diagnosis of SARS-CoV-2 by FDA under an Emergency Use Authorization (EUA). This EUA will remain  in effect (meaning this test can be used) for the duration of the COVID-19 declaration under Section 564(b)(1) of the Act, 21 U.S.C.section 360bbb-3(b)(1), unless the authorization is terminated  or revoked sooner.       Influenza A by PCR NEGATIVE NEGATIVE Final   Influenza B by PCR NEGATIVE NEGATIVE Final    Comment: (NOTE) The Xpert Xpress SARS-CoV-2/FLU/RSV plus assay is intended as an aid in the diagnosis of influenza from Nasopharyngeal swab specimens and should not be used as a sole basis for treatment. Nasal washings and aspirates are unacceptable for Xpert Xpress SARS-CoV-2/FLU/RSV testing.  Fact Sheet for Patients: EntrepreneurPulse.com.au  Fact Sheet for Healthcare Providers: IncredibleEmployment.be  This test is not yet approved or cleared by the [place] FDA and has been authorized for detection and/or diagnosis of SARS-CoV-2 by FDA under an Emergency Use Authorization (EUA). This EUA will remain in effect (meaning this test can be used) for the duration of the COVID-19 declaration under Section 564(b)(1) of the Act, 21 U.S.C. section 360bbb-3(b)(1), unless the authorization is terminated or revoked.  Performed at [facility], [address] [place], [place] [address]     Labs: CBC: Recent Labs  Lab 04/08/22 1142 04/08/22 1540 04/09/22 0727  WBC 13.8* 14.6* 14.3*  HGB 11.6* 9.8* 9.6*  HCT 35.9* 30.2* 28.7*  MCV 92.8 93.8 91.1  PLT 235 194 [id number]    Basic Metabolic Panel: Recent Labs  Lab 04/08/22 1142 04/09/22 0727  NA 134* 133*  K 3.8 3.5  CL 96* 100  CO2 26 24  GLUCOSE 119* 119*  BUN 29* 29*  CREATININE 0.85 0.71  CALCIUM 9.2 9.1   Liver Function Tests: Recent Labs  Lab 04/09/22 0727  AST 51*  ALT 50*  ALKPHOS 123  BILITOT 0.5  PROT 7.4  ALBUMIN 2.7*     Discharge time spent: greater than 30 minutes.  Signed: Loletha Grayer, MD [facility] 04/10/2022

## 2022-04-10 NOTE — Progress Notes (Signed)
[facility] Liaison Note  Follow up with patient's wife Woodrow Salon to see if they had made a decision regarding hospice services.  She verbalizes she would like hospice, but [place] does not want hospice services.  I let her know, patient would have to be in agreement with services in order for [place] to provide services.  PT was working with him this morning and [name] was going to talk to [place] and call me back.  Spoke with [name] and they are choosing home health services at discharge.  Discussed outpatient palliative services with [facility] and they are both on board with receiving palliative care with home health at discharge.  Referral sent to [facility] Palliative care.  Thank you for allowing participation in this patient's care.  Dimas Aguas, RN Nurse Liaison [phone]

## 2022-04-10 NOTE — TOC CM/SW Note (Addendum)
Patient requires the ability to reposition frequently. Pt's head must be elevated greater than 30 degrees.

## 2022-04-10 NOTE — Assessment & Plan Note (Signed)
Sodium a couple points less than normal range at 133.

## 2022-04-12 ENCOUNTER — Other Ambulatory Visit: Payer: Medicare HMO

## 2022-04-12 ENCOUNTER — Telehealth: Payer: Self-pay | Admitting: *Deleted

## 2022-04-12 ENCOUNTER — Telehealth: Payer: Self-pay

## 2022-04-12 DIAGNOSIS — C7970 Secondary malignant neoplasm of unspecified adrenal gland: Secondary | ICD-10-CM | POA: Diagnosis not present

## 2022-04-12 DIAGNOSIS — Z515 Encounter for palliative care: Secondary | ICD-10-CM

## 2022-04-12 DIAGNOSIS — E440 Moderate protein-calorie malnutrition: Secondary | ICD-10-CM | POA: Diagnosis not present

## 2022-04-12 DIAGNOSIS — C786 Secondary malignant neoplasm of retroperitoneum and peritoneum: Secondary | ICD-10-CM | POA: Diagnosis not present

## 2022-04-12 DIAGNOSIS — C7931 Secondary malignant neoplasm of brain: Secondary | ICD-10-CM | POA: Diagnosis not present

## 2022-04-12 DIAGNOSIS — J181 Lobar pneumonia, unspecified organism: Secondary | ICD-10-CM | POA: Diagnosis not present

## 2022-04-12 DIAGNOSIS — C3491 Malignant neoplasm of unspecified part of right bronchus or lung: Secondary | ICD-10-CM | POA: Diagnosis not present

## 2022-04-12 DIAGNOSIS — H9192 Unspecified hearing loss, left ear: Secondary | ICD-10-CM | POA: Diagnosis not present

## 2022-04-12 DIAGNOSIS — C7989 Secondary malignant neoplasm of other specified sites: Secondary | ICD-10-CM | POA: Diagnosis not present

## 2022-04-12 DIAGNOSIS — J440 Chronic obstructive pulmonary disease with acute lower respiratory infection: Secondary | ICD-10-CM | POA: Diagnosis not present

## 2022-04-12 NOTE — Transitions of Care (Post Inpatient/ED Visit) (Addendum)
   04/12/2022  Name: Maurice Simpson MRN: [id number] DOB: [birth year]  Today's TOC FU Call Status: Today's TOC FU Call Status:: Successful TOC FU Call Competed  Transition Care Management Follow-up Telephone Call Date of Discharge: 04/10/22 Discharge Facility[facility]) Type of Discharge: Inpatient Admission Primary Inpatient Discharge Diagnosis:: weakness How have you been since you were released from the hospital?: Same Any questions or concerns?: Yes Patient Questions/Concerns:: Wife had concerns about [facility] transport Patient Questions/Concerns Addressed: Other: (RN sent message to inpt social worker that arranged transport)  Items Reviewed: Did you receive and understand the discharge instructions provided?: Yes Medications obtained and verified?: Yes (Medications Reviewed) Any new allergies since your discharge?: No Dietary orders reviewed?: No Do you have support at home?: Yes People in Home: spouse Name of Support/Comfort Primary Source: [facility] and Equipment/Supplies: [place] Ordered?: Yes Name of [place]:: [facility] Has Agency set up a time to come to your home?: Yes [place] Visit Date: 04/12/22 Any new equipment or medical supplies ordered?: Yes Name of Medical supply agency?: [employer] Were you able to get the equipment/medical supplies?: Yes Do you have any questions related to the use of the equipment/supplies?: No  Functional Questionnaire: Do you need assistance with bathing/showering or dressing?: Yes Do you need assistance with meal preparation?: Yes Do you need assistance with eating?: Yes Do you have difficulty maintaining continence: Yes Do you need assistance with getting out of bed/getting out of a chair/moving?: Yes Do you have difficulty managing or taking your medications?: Yes  Follow up appointments reviewed: PCP Follow-up appointment confirmed?: Yes Date of PCP follow-up  appointment?: 04/16/22 Follow-up Provider: DR [facility] Follow-up appointment confirmed?: NA Do you need transportation to your follow-up appointment?: No Do you understand care options if your condition(s) worsen?: Yes-patient verbalized understanding  SDOH Interventions Today    Flowsheet Row Most Recent Value  SDOH Interventions   Transportation Interventions Intervention Not Indicated      Interventions Today    Flowsheet Row Most Recent Value  General Interventions   General Interventions Discussed/Reviewed General Interventions Discussed, General Interventions Reviewed, Doctor Visits  Doctor Visits Discussed/Reviewed Doctor Visits Discussed, Doctor Visits Reviewed  [referred to social worker]  [place] Discussed/Reviewed Refer to Social Work for resources      [facility] Interventions Today    Flowsheet Row Most Recent Value  TOC Interventions   TOC Interventions Discussed/Reviewed TOC Interventions Discussed, TOC Interventions Reviewed      Referred to social worker [place] [id number] 10:00 for multiple SDOH needs  [place] Management [phone]

## 2022-04-12 NOTE — Telephone Encounter (Signed)
Palliative care outreach to patient/spouse to discuss new PC referral.  Call unsuccessful. SW LVM.

## 2022-04-12 NOTE — Progress Notes (Signed)
TELEPHONE ENCOUNTER  Palliative care outreach to patient/spouse to discuss new palliative care referral and criteria. SW connected with patients spouse.  Current status: Patient with recent hospitalization 3/28-3/30 due to weakness. Patient currently stable since hospital DC, patient requires max assist with ADL's and care. Patient is incontinent with B/B. patient is also on home O2. Patient has hospital bed. Spouse share that their main concern is acquiring resources to provide in home assistance.   Home health: [facility]. Per spouse [place] community account executive to add SW to their services for patient to assist with connecting them to resources and applying for Medicaid and food stamps.  Resources: SW provided spouse with contact information for small cell cancer grant resources through the [facility] [phone] as the grant is currently 'open'.  Plan: PC home visit scheduled for 4/8 @1pm 

## 2022-04-12 NOTE — Transitions of Care (Post Inpatient/ED Visit) (Signed)
   04/12/2022  Name: Maurice Simpson MRN: [id number] DOB: 1955-05-23  Today's TOC FU Call Status: Today's TOC FU Call Status:: Unsuccessul Call (1st Attempt) Unsuccessful Call (1st Attempt) Date: 04/12/22  Attempted to reach the patient regarding the most recent Inpatient/ED visit.  Follow Up Plan: Additional outreach attempts will be made to reach the patient to complete the Transitions of Care (Post Inpatient/ED visit) call.   [place] Care Management [phone]

## 2022-04-14 ENCOUNTER — Encounter: Payer: Self-pay | Admitting: *Deleted

## 2022-04-14 ENCOUNTER — Telehealth: Payer: Self-pay | Admitting: *Deleted

## 2022-04-14 DIAGNOSIS — J181 Lobar pneumonia, unspecified organism: Secondary | ICD-10-CM | POA: Diagnosis not present

## 2022-04-14 DIAGNOSIS — C7970 Secondary malignant neoplasm of unspecified adrenal gland: Secondary | ICD-10-CM | POA: Diagnosis not present

## 2022-04-14 DIAGNOSIS — C7989 Secondary malignant neoplasm of other specified sites: Secondary | ICD-10-CM | POA: Diagnosis not present

## 2022-04-14 DIAGNOSIS — C3491 Malignant neoplasm of unspecified part of right bronchus or lung: Secondary | ICD-10-CM | POA: Diagnosis not present

## 2022-04-14 DIAGNOSIS — J440 Chronic obstructive pulmonary disease with acute lower respiratory infection: Secondary | ICD-10-CM | POA: Diagnosis not present

## 2022-04-14 DIAGNOSIS — C786 Secondary malignant neoplasm of retroperitoneum and peritoneum: Secondary | ICD-10-CM | POA: Diagnosis not present

## 2022-04-14 DIAGNOSIS — E440 Moderate protein-calorie malnutrition: Secondary | ICD-10-CM | POA: Diagnosis not present

## 2022-04-14 DIAGNOSIS — H9192 Unspecified hearing loss, left ear: Secondary | ICD-10-CM | POA: Diagnosis not present

## 2022-04-14 DIAGNOSIS — C7931 Secondary malignant neoplasm of brain: Secondary | ICD-10-CM | POA: Diagnosis not present

## 2022-04-14 NOTE — Patient Outreach (Signed)
  Care Coordination   04/14/2022 Name: Maurice Simpson MRN: [id number] DOB: [birth year]   Care Coordination Outreach Attempts:  An unsuccessful telephone outreach was attempted today to offer the patient information about available care coordination services as a benefit of their health plan.   Follow Up Plan:  Additional outreach attempts will be made to offer the patient care coordination information and services.   Encounter Outcome:  No Answer   Care Coordination Interventions:  No, not indicated    Keyden Pavlov, Queets Worker  [facility] Care Management [phone]

## 2022-04-15 ENCOUNTER — Emergency Department
Admission: EM | Admit: 2022-04-15 | Discharge: 2022-04-15 | Disposition: A | Discharge status: Home / Self Care | Payer: Medicare HMO | Attending: Emergency Medicine | Admitting: Emergency Medicine

## 2022-04-15 ENCOUNTER — Encounter: Payer: Self-pay | Admitting: Emergency Medicine

## 2022-04-15 DIAGNOSIS — Z7951 Long term (current) use of inhaled steroids: Secondary | ICD-10-CM | POA: Insufficient documentation

## 2022-04-15 DIAGNOSIS — R339 Retention of urine, unspecified: Secondary | ICD-10-CM | POA: Insufficient documentation

## 2022-04-15 DIAGNOSIS — R300 Dysuria: Secondary | ICD-10-CM | POA: Insufficient documentation

## 2022-04-15 DIAGNOSIS — J449 Chronic obstructive pulmonary disease, unspecified: Secondary | ICD-10-CM | POA: Insufficient documentation

## 2022-04-15 DIAGNOSIS — R6889 Other general symptoms and signs: Secondary | ICD-10-CM | POA: Diagnosis not present

## 2022-04-15 DIAGNOSIS — Z7401 Bed confinement status: Secondary | ICD-10-CM | POA: Diagnosis not present

## 2022-04-15 DIAGNOSIS — Z85118 Personal history of other malignant neoplasm of bronchus and lung: Secondary | ICD-10-CM | POA: Insufficient documentation

## 2022-04-15 DIAGNOSIS — N390 Urinary tract infection, site not specified: Secondary | ICD-10-CM | POA: Diagnosis not present

## 2022-04-15 LAB — URINALYSIS, ROUTINE W REFLEX MICROSCOPIC
Bacteria, UA: NONE SEEN
Bilirubin Urine: NEGATIVE
Glucose, UA: NEGATIVE mg/dL
Hgb urine dipstick: NEGATIVE
Ketones, ur: NEGATIVE mg/dL
Leukocytes,Ua: NEGATIVE
Nitrite: NEGATIVE
Protein, ur: NEGATIVE mg/dL
Specific Gravity, Urine: 1.017 (ref 1.005–1.030)
Squamous Epithelial / HPF: NONE SEEN /HPF (ref 0–5)
pH: 6 (ref 5.0–8.0)

## 2022-04-15 MED ORDER — CIPROFLOXACIN HCL 500 MG PO TABS: 500.0000 mg | ORAL_TABLET | Freq: Two times a day (BID) | ORAL | 0 refills | Status: DC

## 2022-04-15 MED ORDER — LIDOCAINE VISCOUS HCL 2 % MT SOLN: 15.0000 mL | Freq: Once | OROMUCOSAL | Status: DC

## 2022-04-15 MED ORDER — LIDOCAINE HCL URETHRAL/MUCOSAL 2 % EX GEL
1.0000 | Freq: Once | CUTANEOUS | Status: AC
  Administered 2022-04-15: 1 via URETHRAL
  Filled 2022-04-15: qty 6

## 2022-04-15 MED ORDER — LIDOCAINE HCL (CARDIAC) PF 100 MG/5ML IV SOSY
PREFILLED_SYRINGE | INTRAVENOUS | Status: AC
  Filled 2022-04-15: qty 5

## 2022-04-15 MED ORDER — TAMSULOSIN HCL 0.4 MG PO CAPS: 0.4000 mg | ORAL_CAPSULE | Freq: Every day | ORAL | 1 refills | Status: DC

## 2022-04-15 NOTE — ED Notes (Signed)
[facility]  CALLED  FOR  TRANSPORT  HOME 

## 2022-04-15 NOTE — ED Notes (Addendum)
Bladder scanned in triage, 429ml found.

## 2022-04-15 NOTE — ED Triage Notes (Signed)
Pt presents via stretcher to triage via [facility] from home. Pt states he has no complaints but "yall came and got me for another bill." Pt was seen here earlier today for same. No acute changes since D/C. A&Ox4 at this time. Denies CP or SOB.

## 2022-04-15 NOTE — ED Notes (Signed)
351ml drained from bladder

## 2022-04-15 NOTE — ED Triage Notes (Signed)
First Nurse Note: Arrives via [facility] from home.  C/O difficulty and burning with urination x 'weeks'.  VS wnl.  Bed bound at home. Hx lung cancer.

## 2022-04-15 NOTE — ED Provider Notes (Signed)
[facility] Provider Note    Event Date/Time   First MD Initiated Contact with Patient 04/15/22 1143     (approximate)   History   Urinary Retention   HPI  Maurice Simpson is a 67 y.o. male with a history of COPD, small cell lung cancer, who is essentially bedbound at home who presents with complaints of difficulty urinating and dysuria.  This has been occurring over the last several days.  No fevers reported.  No flank pain or back pain     Physical Exam   Triage Vital Signs: ED Triage Vitals  Enc Vitals Group     BP 04/15/22 1143 130/87     Pulse Rate 04/15/22 1143 94     Resp 04/15/22 1143 17     Temp 04/15/22 1143 98.2 F (36.8 C)     Temp Source 04/15/22 1143 Oral     SpO2 04/15/22 1143 90 %     Weight 04/15/22 1138 68 kg (150 lb)     Height --      Head Circumference --      Peak Flow --      Pain Score 04/15/22 1138 5     Pain Loc --      Pain Edu? --      Excl. in [place]? --     Most recent vital signs: Vitals:   04/15/22 1143  BP: 130/87  Pulse: 94  Resp: 17  Temp: 98.2 F (36.8 C)  SpO2: 90%     General: Awake, no distress.  CV:  Good peripheral perfusion.  Resp:  Normal effort.  Abd:  No distention.  No suprapubic tenderness, no CVA tenderness Other:  GU: No inflammation of the penis, no scrotal swelling   ED Results / Procedures / Treatments   Labs (all labs ordered are listed, but only abnormal results are displayed) Labs Reviewed  URINALYSIS, ROUTINE W REFLEX MICROSCOPIC - Abnormal; Notable for the following components:      Result Value   Color, Urine YELLOW (*)    APPearance HAZY (*)    All other components within normal limits     EKG     RADIOLOGY     PROCEDURES:  Critical Care performed:   Procedures   MEDICATIONS ORDERED IN ED: Medications  lidocaine (cardiac) 100 mg/56mL (XYLOCAINE) 100 MG/5ML injection 2% (100 mg  Not Given 04/15/22 1220)  lidocaine (XYLOCAINE) 2 % jelly 1 Application (1  Application Urethral Given 04/15/22 1227)     IMPRESSION / MDM / ASSESSMENT AND PLAN / ED COURSE  I reviewed the triage vital signs and the nursing notes. Patient's presentation is most consistent with acute complicated illness / injury requiring diagnostic workup.   Patient presents with dysuria, possible mild retention.  Does have likely history of undiagnosed BPH as wife reports frequent trips to the bathroom prior to becoming bedbound  Urinalysis is overall reassuring, given the dysuria he has been having though concern for possible infection  He would like to avoid having a Foley catheter at this time, will treat with Flomax, antibiotics and place referral to urology.  He knows to return if further episodes of retention       FINAL CLINICAL IMPRESSION(S) / ED DIAGNOSES   Final diagnoses:  Dysuria  Urinary retention     Rx / DC Orders   ED Discharge Orders          Ordered    Ambulatory referral to Urology  04/15/22 1332    tamsulosin (FLOMAX) 0.4 MG CAPS capsule  Daily,   Status:  Discontinued        04/15/22 1332    ciprofloxacin (CIPRO) 500 MG tablet  2 times daily        04/15/22 1332    tamsulosin (FLOMAX) 0.4 MG CAPS capsule  Daily        04/15/22 1333             Note:  This document was prepared using Dragon voice recognition software and may include unintentional dictation errors.   Lavonia Drafts, MD 04/15/22 [phone]

## 2022-04-15 NOTE — ED Triage Notes (Signed)
EMS brings pt in from home; Pt d/c 3hrs PTA for dysuria; c/o persistent c/o

## 2022-04-15 NOTE — ED Triage Notes (Signed)
Pt arrives via EMS from home. Per Pt, he has been having problems with urination and burning with urination also.

## 2022-04-15 NOTE — ED Notes (Signed)
Bladder scan preformed at this time. 267mL of urine found in pt bladder. Angelina Sheriff, RN made aware of results.

## 2022-04-16 ENCOUNTER — Telehealth: Payer: Self-pay | Admitting: *Deleted

## 2022-04-16 ENCOUNTER — Emergency Department
Admission: EM | Admit: 2022-04-16 | Discharge: 2022-04-16 | Disposition: A | Discharge status: Home / Self Care | Payer: Medicare HMO | Source: Home / Self Care | Attending: Emergency Medicine | Admitting: Emergency Medicine

## 2022-04-16 ENCOUNTER — Ambulatory Visit: Payer: Self-pay

## 2022-04-16 ENCOUNTER — Inpatient Hospital Stay: Payer: Medicare HMO | Attending: Oncology | Admitting: Oncology

## 2022-04-16 DIAGNOSIS — C786 Secondary malignant neoplasm of retroperitoneum and peritoneum: Secondary | ICD-10-CM | POA: Diagnosis not present

## 2022-04-16 DIAGNOSIS — C7970 Secondary malignant neoplasm of unspecified adrenal gland: Secondary | ICD-10-CM | POA: Diagnosis not present

## 2022-04-16 DIAGNOSIS — J440 Chronic obstructive pulmonary disease with acute lower respiratory infection: Secondary | ICD-10-CM | POA: Diagnosis not present

## 2022-04-16 DIAGNOSIS — H9192 Unspecified hearing loss, left ear: Secondary | ICD-10-CM | POA: Diagnosis not present

## 2022-04-16 DIAGNOSIS — R69 Illness, unspecified: Secondary | ICD-10-CM | POA: Diagnosis not present

## 2022-04-16 DIAGNOSIS — E440 Moderate protein-calorie malnutrition: Secondary | ICD-10-CM | POA: Diagnosis not present

## 2022-04-16 DIAGNOSIS — C7989 Secondary malignant neoplasm of other specified sites: Secondary | ICD-10-CM | POA: Diagnosis not present

## 2022-04-16 DIAGNOSIS — J181 Lobar pneumonia, unspecified organism: Secondary | ICD-10-CM | POA: Diagnosis not present

## 2022-04-16 DIAGNOSIS — C3491 Malignant neoplasm of unspecified part of right bronchus or lung: Secondary | ICD-10-CM | POA: Diagnosis not present

## 2022-04-16 DIAGNOSIS — C7931 Secondary malignant neoplasm of brain: Secondary | ICD-10-CM | POA: Diagnosis not present

## 2022-04-16 DIAGNOSIS — R338 Other retention of urine: Secondary | ICD-10-CM

## 2022-04-16 MED ORDER — OXYCODONE HCL 5 MG PO TABS
5.0000 mg | ORAL_TABLET | Freq: Once | ORAL | Status: AC
  Administered 2022-04-16: 5 mg via ORAL
  Filled 2022-04-16: qty 1

## 2022-04-16 NOTE — Patient Outreach (Signed)
  Care Coordination   04/16/2022 Name: Maurice Simpson MRN: [id number] DOB: 04-05-1955   Care Coordination Outreach Attempts:  A second unsuccessful outreach was attempted today to offer the patient with information about available care coordination services as a benefit of their health plan.     Follow Up Plan:  Additional outreach attempts will be made to offer the patient care coordination information and services.   Encounter Outcome:  No Answer   Care Coordination Interventions:  No, not indicated   Eliot Bencivenga, LCSW Clinical Social Worker  [facility] Care Management [phone]

## 2022-04-16 NOTE — Progress Notes (Signed)
   CCM RN Visit Note   04-16-2022 Name: DEVERON LONGNECKER MRN: [id number]      DOB: September 04, 1955  Subjective: RYLO FICKER is a 67 y.o. year old male who is a primary care patient of Creig Hines, MD. The patient was referred to the Chronic Care Management team for assistance with care management needs subsequent to provider initiation of CCM services and plan of care.      Encounter created to change CCM status from currently enrolled to previously enrolled. The patient is followed by Dr. Owens Shark with oncology. Also works with [employer] on a regular basis.       Plan:No further follow up required: will follow patient if new needs arise and a new order of CCM is placed.  Alto Denver RN, MSN, CCM RN Care Manager  Chronic Care Management Direct Number: [phone]

## 2022-04-16 NOTE — ED Notes (Signed)
Pts wife called and informed of his departure from the ED.

## 2022-04-16 NOTE — ED Notes (Signed)
called to [facility] for transport home/rep:[email or web address].Marland Kitchen

## 2022-04-16 NOTE — ED Provider Notes (Signed)
[facility] Provider Note    Event Date/Time   First MD Initiated Contact with Patient 04/16/22 0038     (approximate)   History   Dysuria   HPI  Maurice Simpson is a 67 y.o. male with history of COPD, lung cancer with mets to the brain, bedbound who is currently on hospice who presented to the emergency department with intermittent urinary retention, dysuria.  He was seen here earlier today and was given Flomax and Cipro.  Does have history of BPH.  It appears a Foley catheter was offered but patient declined.  Wife states they called the hospice nurse who told him to come to the emergency department for a Foley catheter given his intermittent retention.   History provided by patient, wife.    Past Medical History:  Diagnosis Date   Cancer    COPD (chronic obstructive pulmonary disease)    Dyspnea     Past Surgical History:  Procedure Laterality Date   CATARACT EXTRACTION Right 03/10/2021   IR IMAGING GUIDED PORT INSERTION  07/27/2021    MEDICATIONS:  Prior to Admission medications   Medication Sig Start Date End Date Taking? Authorizing Provider  acetaminophen (TYLENOL) 325 MG tablet Take 2 tablets (650 mg total) by mouth every 6 (six) hours as needed for mild pain, moderate pain, fever or headache. 11/27/21   Esaw GrandchildGriffith, Kelly A, DO  albuterol (PROVENTIL) (2.5 MG/3ML) 0.083% nebulizer solution Take 3 mLs (2.5 mg total) by nebulization every 6 (six) hours as needed for wheezing or shortness of breath. 08/03/21   Creig Hinesao, Archana C, MD  azithromycin (ZITHROMAX) 250 MG tablet One tab po every evening for three days 04/10/22   Alford HighlandWieting, Richard, MD  budesonide (PULMICORT) 0.25 MG/2ML nebulizer solution Take 2 mLs (0.25 mg total) by nebulization 2 (two) times daily as needed. 08/03/21   Creig Hinesao, Archana C, MD  ciprofloxacin (CIPRO) 500 MG tablet Take 1 tablet (500 mg total) by mouth 2 (two) times daily. 04/15/22   Jene EveryKinner, Robert, MD  Docusate Sodium (COLACE PO) Take 1  capsule by mouth as needed (constipation).    [provider]  feeding supplement (ENSURE ENLIVE / ENSURE PLUS) LIQD Take 237 mLs by mouth 4 (four) times daily. 11/27/21   Pennie BanterGriffith, Kelly A, DO  lidocaine-prilocaine (EMLA) cream Apply 1 Application topically as needed. [facility] prior to access 10/12/21   [provider]  ondansetron (ZOFRAN) 4 MG tablet Take 1 tablet (4 mg total) by mouth every 8 (eight) hours as needed for nausea or vomiting. 10/12/21   Creig Hinesao, Archana C, MD  oxyCODONE (OXY IR/ROXICODONE) 5 MG immediate release tablet Take 1 tablet (5 mg total) by mouth every 4 (four) hours as needed for severe pain. 03/30/22   Borders, Daryl EasternJoshua R, NP  pantoprazole (PROTONIX) 40 MG tablet Take 1 tablet (40 mg total) by mouth daily. 04/11/22   Alford HighlandWieting, Richard, MD  polyethylene glycol (MIRALAX / GLYCOLAX) 17 g packet Take 17 g by mouth daily as needed. 04/10/22   Alford HighlandWieting, Richard, MD  tamsulosin (FLOMAX) 0.4 MG CAPS capsule Take 1 capsule (0.4 mg total) by mouth daily. 04/15/22 06/14/22  Jene EveryKinner, Robert, MD    Physical Exam   Triage Vital Signs: ED Triage Vitals  Enc Vitals Group     BP 04/15/22 2243 118/77     Pulse Rate 04/15/22 2243 78     Resp 04/15/22 2243 18     Temp 04/15/22 2243 98 F (36.7 C)  Temp src --      SpO2 04/15/22 2243 97 %     Weight 04/15/22 2242 151 lb 7.3 oz (68.7 kg)     Height 04/15/22 2242  (1.727 m)     Head Circumference --      Peak Flow --      Pain Score 04/15/22 2242 2     Pain Loc --      Pain Edu? --      Excl. in [place]? --     Most recent vital signs: Vitals:   04/15/22 2243 04/16/22 0154  BP: 118/77 122/74  Pulse: 78 72  Resp: 18 18  Temp: 98 F (36.7 C) 98.1 F (36.7 C)  SpO2: 97% 100%    CONSTITUTIONAL: Alert, responds appropriately to questions.  Really, cachectic, chronically ill-appearing HEAD: Normocephalic, atraumatic EYES: Conjunctivae clear, pupils appear equal, sclera nonicteric ENT: normal nose; moist  mucous membranes NECK: Supple, normal ROM CARD: RRR; S1 and S2 appreciated RESP: Normal chest excursion without splinting or tachypnea; breath sounds clear and equal bilaterally; no wheezes, no rhonchi, no rales, no hypoxia or respiratory distress, speaking full sentences ABD/GI: Non-distended; soft, non-tender, no rebound, no guarding, no peritoneal signs BACK: The back appears normal EXT: Normal ROM in all joints; no deformity noted, no edema SKIN: Normal color for age and race; warm; no rash on exposed skin NEURO: Moves all extremities equally, normal speech PSYCH: The patient's mood and manner are appropriate.   ED Results / Procedures / Treatments   LABS: (all labs ordered are listed, but only abnormal results are displayed) Labs Reviewed - No data to display   EKG:  RADIOLOGY: My personal review and interpretation of imaging:    I have personally reviewed all radiology reports.   No results found.   PROCEDURES:  Critical Care performed: No      Procedures    IMPRESSION / MDM / ASSESSMENT AND PLAN / ED COURSE  I reviewed the triage vital signs and the nursing notes.    Patient here with intermittent urinary retention.  Wife reports crying out in pain intermittently at home.    DIFFERENTIAL DIAGNOSIS (includes but not limited to):   Acute urinary retention for BPH, UTI, doubt pyelonephritis, sepsis   Patient's presentation is most consistent with acute complicated illness / injury requiring diagnostic workup.   PLAN: Urine obtained earlier today showed no sign of infection but he was empirically placed on Cipro given symptoms concerning for UTI.  He declined Foley catheter earlier today but agrees to 1 currently.  Bladder scan shows only 280 mL but I suspect that he truly has intermittent urinary retention and that we will have some overflow incontinence which gives him relief for a brief period of time until his bladder fills back up again and that is when  he is crying out of pain at home.  Patient is on hospice.  Wife does not want any other further workup done in the ED.  She would just like a Foley catheter placed and then for patient to be sent back home by EMS.  I feel this is very reasonable.  Will give urology follow-up.   MEDICATIONS GIVEN IN ED: Medications  oxyCODONE (Oxy IR/ROXICODONE) immediate release tablet 5 mg (5 mg Oral Given 04/16/22 0132)     ED COURSE:  At this time, I do not feel there is any life-threatening condition present. I reviewed all nursing notes, vitals, pertinent previous records.  All lab and urine results,  EKGs, imaging ordered have been independently reviewed and interpreted by myself.  I reviewed all available radiology reports from any imaging ordered this visit.  Based on my assessment, I feel the patient is safe to be discharged home without further emergent workup and can continue workup as an outpatient as needed. Discussed all findings, treatment plan as well as usual and customary return precautions.  They verbalize understanding and are comfortable with this plan.  Outpatient follow-up has been provided as needed.  All questions have been answered.    CONSULTS:  none   OUTSIDE RECORDS REVIEWED: Reviewed previous palliative care notes.       FINAL CLINICAL IMPRESSION(S) / ED DIAGNOSES   Final diagnoses:  Acute urinary retention     Rx / DC Orders   ED Discharge Orders     None        Note:  This document was prepared using Dragon voice recognition software and may include unintentional dictation errors.   Kavitha Lansdale, Layla Maw, DO 04/16/22 [phone]

## 2022-04-19 ENCOUNTER — Encounter: Payer: Self-pay | Admitting: Oncology

## 2022-04-19 ENCOUNTER — Other Ambulatory Visit: Payer: Medicare HMO

## 2022-04-19 DIAGNOSIS — Z515 Encounter for palliative care: Secondary | ICD-10-CM

## 2022-04-19 NOTE — Progress Notes (Signed)
COMMUNITY PALLIATIVE CARE SW NOTE  PATIENT NAME: Maurice Simpson DOB: 1955-02-02 MRN: [id number]  PRIMARY CARE PROVIDER: Creig Hines, MD  RESPONSIBLE PARTY:  Acct ID - Guarantor Home Phone Work Phone Relationship Acct Type  [id number] MERRITT, ACHEE [phone]  Self P/F     [address], [place], [place] [address]     PLAN OF CARE and INTERVENTIONS:         GOALS OF CARE/ ADVANCE CARE PLANNING:    Goals include to maximize quality of life and assist with pain management. Our advance care planning conversation included a discussion about:    The value and importance of advance care planning  Review and updating or creation of an advance directive document.                          Code Status: FULL CODE.    ACD: None in place at this time.    GOC: ongoing discussion  2.        SOCIAL/EMOTIONAL/SPIRITUAL ASSESSMENT/ INTERVENTIONS:         Palliative care encounter: SW completed initial in home visit with patient and patient spouse. Palliative care discussed and consent form completed.   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 Patient with noted baseline metastatic lung cancer. No longer on treatment as of November last year. Patient with recent hospitalizations due to urine retention.   Mobility: patient is non ambulatory and mostly bed bound. Patient requires total assistance with ADL's.  Appetite: Poor. Patient receives nutrients from ensure and body armors. Patient does eat yogurt and ice cream some during the day.  Constipation: spouse and patient share that patient has not had a bowel movement since 4/4. Spouse has given patient Dulcolax tablet as well as 68ml of miralax, with no resolve. Spouse inquired about SW outreaching oncology office for guidance as patient has not seen pcp in a while. Message sent to [facility] borders, NP.    Psychosocial assessment:  completed.   In home support: Patients spouse is main caregiver. Patients son provides some support. [facility] provides PT/RN, spouse has inquired about OT and SW.  Transportation: no needs.  Food: no food insecurities witnessed.   Safety and long term planning: patient feels safe in his home and desires to remain in his home with spouse.   Resources: SW provided spouse with a Medicaid application to complete. Spouse is hopeful that they may be able to apply and qualify for food stamps. Spouse to contact [employer] lung cancer grant contact for eligibility as well. SW to check with [facility] volunteer about vanilla ensure, size L briefs and disposable chuck pads.   SW discussed goals, reviewed care plan, provided emotional support, used active and reflective listening in the form of reciprocity emotional response. Questions and concerns were addressed. The patient/family was encouraged to call with any additional questions and/or concerns. PC Provided general support and encouragement, no other unmet needs identified. Will continue to follow.  Plan: PC to follow up in 2 weeks.   3.         PATIENT/CAREGIVER EDUCATION/ COPING:   Appearance: well groomed, appropriate given situation  Mental Status: Alert but disoriented. Eye Contact: poor. Patient laid with her eyes majority of visit  Thought Process: rational  Thought Content: not assessed  Speech: slurred and quick paced  Mood: Normal and  calm Affect: Congruent to endorsed mood, full ranging Insight: poor-fair Judgement: poor-fair Interaction Style: Cooperative   Patient A&O able to make needs known. Patient was engaging and able to answers questions. However patient dosed off during visit due to have taking pain medication earlier.   4.         PERSONAL EMERGENCY PLAN:  Patient/spouse will call 9-1-1 for emergencies.    5.         COMMUNITY RESOURCES COORDINATION/ HEALTH CARE NAVIGATION:  patients spouse manages his care.     6.      FINANCIAL CONCERNS/NEEDS: patient will be applying for medicaid                         Primary Health Insurance:  [employer] Secondary Health Insurance: none Prescription Coverage: Yes, no history of difficulty obtaining or affording prescriptions reported.     SOCIAL HX:  Social History   Tobacco Use   Smoking status: Former    Packs/day: 2.00    Years: 51.00    Additional pack years: 0.00    Total pack years: 102.00    Types: Cigarettes    Quit date: 11/07/2021    Years since quitting: 0.4   Smokeless tobacco: Never  Substance Use Topics   Alcohol use: Not Currently    CODE STATUS: full code ADVANCED DIRECTIVES: n MOST FORM COMPLETE:  n HOSPICE EDUCATION PROVIDED: n  PPS: 30%  Time spent: 30 min      [place], [place]

## 2022-04-20 ENCOUNTER — Telehealth: Payer: Self-pay | Admitting: *Deleted

## 2022-04-20 ENCOUNTER — Ambulatory Visit: Payer: Medicare HMO | Admitting: Oncology

## 2022-04-20 ENCOUNTER — Other Ambulatory Visit: Payer: Medicare HMO

## 2022-04-20 DIAGNOSIS — C7931 Secondary malignant neoplasm of brain: Secondary | ICD-10-CM | POA: Diagnosis not present

## 2022-04-20 DIAGNOSIS — C3491 Malignant neoplasm of unspecified part of right bronchus or lung: Secondary | ICD-10-CM | POA: Diagnosis not present

## 2022-04-20 DIAGNOSIS — J181 Lobar pneumonia, unspecified organism: Secondary | ICD-10-CM | POA: Diagnosis not present

## 2022-04-20 DIAGNOSIS — C7970 Secondary malignant neoplasm of unspecified adrenal gland: Secondary | ICD-10-CM | POA: Diagnosis not present

## 2022-04-20 DIAGNOSIS — J440 Chronic obstructive pulmonary disease with acute lower respiratory infection: Secondary | ICD-10-CM | POA: Diagnosis not present

## 2022-04-20 DIAGNOSIS — E440 Moderate protein-calorie malnutrition: Secondary | ICD-10-CM | POA: Diagnosis not present

## 2022-04-20 DIAGNOSIS — H9192 Unspecified hearing loss, left ear: Secondary | ICD-10-CM | POA: Diagnosis not present

## 2022-04-20 DIAGNOSIS — C7989 Secondary malignant neoplasm of other specified sites: Secondary | ICD-10-CM | POA: Diagnosis not present

## 2022-04-20 DIAGNOSIS — C786 Secondary malignant neoplasm of retroperitoneum and peritoneum: Secondary | ICD-10-CM | POA: Diagnosis not present

## 2022-04-20 NOTE — Telephone Encounter (Addendum)
Patient wife called reporting that patient has not had bowel movement since early morning hours of Thursday. He is miserable. She is giving him Miralax twice a day and colace once a day. She is asking that something be done for him. Please advise

## 2022-04-20 NOTE — Telephone Encounter (Signed)
Senna, lactulose and if it does not work- patient and wife need to decide what their goals are- transition to home hospice versus ER

## 2022-04-20 NOTE — Telephone Encounter (Signed)
I spoke with Mrs Persad and advised of doctor response, She said that the home health nurse is there now and that patient is finally going to bathroom on his own so she may have called too soon. She states that she will give the senna and Miralax as ordered.

## 2022-04-21 ENCOUNTER — Telehealth: Payer: Self-pay

## 2022-04-21 ENCOUNTER — Inpatient Hospital Stay: Payer: Medicare HMO | Admitting: Hospice and Palliative Medicine

## 2022-04-21 ENCOUNTER — Telehealth: Payer: Medicare HMO | Admitting: Hospice and Palliative Medicine

## 2022-04-21 DIAGNOSIS — C786 Secondary malignant neoplasm of retroperitoneum and peritoneum: Secondary | ICD-10-CM | POA: Diagnosis not present

## 2022-04-21 DIAGNOSIS — H9192 Unspecified hearing loss, left ear: Secondary | ICD-10-CM | POA: Diagnosis not present

## 2022-04-21 DIAGNOSIS — C7931 Secondary malignant neoplasm of brain: Secondary | ICD-10-CM | POA: Diagnosis not present

## 2022-04-21 DIAGNOSIS — C7970 Secondary malignant neoplasm of unspecified adrenal gland: Secondary | ICD-10-CM | POA: Diagnosis not present

## 2022-04-21 DIAGNOSIS — C7989 Secondary malignant neoplasm of other specified sites: Secondary | ICD-10-CM | POA: Diagnosis not present

## 2022-04-21 DIAGNOSIS — E440 Moderate protein-calorie malnutrition: Secondary | ICD-10-CM | POA: Diagnosis not present

## 2022-04-21 DIAGNOSIS — C3491 Malignant neoplasm of unspecified part of right bronchus or lung: Secondary | ICD-10-CM | POA: Diagnosis not present

## 2022-04-21 DIAGNOSIS — J181 Lobar pneumonia, unspecified organism: Secondary | ICD-10-CM | POA: Diagnosis not present

## 2022-04-21 DIAGNOSIS — J440 Chronic obstructive pulmonary disease with acute lower respiratory infection: Secondary | ICD-10-CM | POA: Diagnosis not present

## 2022-04-21 NOTE — Telephone Encounter (Signed)
        Patient  visited [place] on 4/5    Telephone encounter attempt :2nd  A HIPAA compliant voice message was left requesting a return call.  Instructed patient to call back.    Taylan Marez [facility] Guide, [place] [phone] [address], [place], [place] [address] Phone: [phone] Email: [email or web address]       

## 2022-04-21 NOTE — Telephone Encounter (Signed)
        Patient  visited [place] on 4/5     Telephone encounter attempt :  1st  A HIPAA compliant voice message was left requesting a return call.  Instructed patient to call back.    Lenard Forth [facility] Guide, [facility] [phone] [address] [place], [place], [place] [address] Phone: [phone] Email: [email or web address]

## 2022-04-26 DIAGNOSIS — C7989 Secondary malignant neoplasm of other specified sites: Secondary | ICD-10-CM | POA: Diagnosis not present

## 2022-04-26 DIAGNOSIS — C3491 Malignant neoplasm of unspecified part of right bronchus or lung: Secondary | ICD-10-CM | POA: Diagnosis not present

## 2022-04-26 DIAGNOSIS — C7970 Secondary malignant neoplasm of unspecified adrenal gland: Secondary | ICD-10-CM | POA: Diagnosis not present

## 2022-04-26 DIAGNOSIS — C786 Secondary malignant neoplasm of retroperitoneum and peritoneum: Secondary | ICD-10-CM | POA: Diagnosis not present

## 2022-04-26 DIAGNOSIS — J181 Lobar pneumonia, unspecified organism: Secondary | ICD-10-CM | POA: Diagnosis not present

## 2022-04-26 DIAGNOSIS — E440 Moderate protein-calorie malnutrition: Secondary | ICD-10-CM | POA: Diagnosis not present

## 2022-04-26 DIAGNOSIS — H9192 Unspecified hearing loss, left ear: Secondary | ICD-10-CM | POA: Diagnosis not present

## 2022-04-26 DIAGNOSIS — C7931 Secondary malignant neoplasm of brain: Secondary | ICD-10-CM | POA: Diagnosis not present

## 2022-04-26 DIAGNOSIS — J440 Chronic obstructive pulmonary disease with acute lower respiratory infection: Secondary | ICD-10-CM | POA: Diagnosis not present

## 2022-04-27 ENCOUNTER — Other Ambulatory Visit: Payer: Self-pay | Admitting: *Deleted

## 2022-04-27 ENCOUNTER — Telehealth: Payer: Self-pay | Admitting: *Deleted

## 2022-04-27 DIAGNOSIS — C7989 Secondary malignant neoplasm of other specified sites: Secondary | ICD-10-CM | POA: Diagnosis not present

## 2022-04-27 DIAGNOSIS — E440 Moderate protein-calorie malnutrition: Secondary | ICD-10-CM | POA: Diagnosis not present

## 2022-04-27 DIAGNOSIS — J181 Lobar pneumonia, unspecified organism: Secondary | ICD-10-CM | POA: Diagnosis not present

## 2022-04-27 DIAGNOSIS — H9192 Unspecified hearing loss, left ear: Secondary | ICD-10-CM | POA: Diagnosis not present

## 2022-04-27 DIAGNOSIS — C7931 Secondary malignant neoplasm of brain: Secondary | ICD-10-CM | POA: Diagnosis not present

## 2022-04-27 DIAGNOSIS — C7970 Secondary malignant neoplasm of unspecified adrenal gland: Secondary | ICD-10-CM | POA: Diagnosis not present

## 2022-04-27 DIAGNOSIS — C786 Secondary malignant neoplasm of retroperitoneum and peritoneum: Secondary | ICD-10-CM | POA: Diagnosis not present

## 2022-04-27 DIAGNOSIS — J440 Chronic obstructive pulmonary disease with acute lower respiratory infection: Secondary | ICD-10-CM | POA: Diagnosis not present

## 2022-04-27 DIAGNOSIS — C3491 Malignant neoplasm of unspecified part of right bronchus or lung: Secondary | ICD-10-CM | POA: Diagnosis not present

## 2022-04-27 NOTE — Telephone Encounter (Signed)
Verbal order called to [facility] for foley care and maintainance

## 2022-04-27 NOTE — Telephone Encounter (Addendum)
Maurice Simpson nurse with [facility] called asking about orders for foley cath care and to reinsert foley if patient pulls it out inadvertently. Patient went to ER for the insertion and has BPH, he has an appointment with Urology which the wife is not sure she wants to keep given how long he may have to wait for transportation home after the appointment. I advised that she needs to call urology office regarding that appointment to see if they could do a virtual visit or what they can offer there. Please advise if you want o to give orders re foley

## 2022-04-27 NOTE — Telephone Encounter (Addendum)
It was put in in the ER and a Urology referral made for retention

## 2022-04-27 NOTE — Telephone Encounter (Signed)
I attempted to call Durwin Nora back , it went to voice mail and hung up without being able to leave a message. I will attempt to call back later

## 2022-04-27 NOTE — Telephone Encounter (Signed)
Who put the foley in to begin with? Was that put in the ER? If so we can give orders for reinsert. If urology gave orders for foley then they should send the orders

## 2022-04-28 ENCOUNTER — Encounter: Payer: Self-pay | Admitting: Oncology

## 2022-04-28 MED ORDER — OXYCODONE HCL 5 MG PO TABS: 5.0000 mg | ORAL_TABLET | ORAL | 0 refills | Status: DC | PRN

## 2022-04-29 DIAGNOSIS — H9192 Unspecified hearing loss, left ear: Secondary | ICD-10-CM | POA: Diagnosis not present

## 2022-04-29 DIAGNOSIS — C3491 Malignant neoplasm of unspecified part of right bronchus or lung: Secondary | ICD-10-CM | POA: Diagnosis not present

## 2022-04-29 DIAGNOSIS — E440 Moderate protein-calorie malnutrition: Secondary | ICD-10-CM | POA: Diagnosis not present

## 2022-04-29 DIAGNOSIS — C7989 Secondary malignant neoplasm of other specified sites: Secondary | ICD-10-CM | POA: Diagnosis not present

## 2022-04-29 DIAGNOSIS — C786 Secondary malignant neoplasm of retroperitoneum and peritoneum: Secondary | ICD-10-CM | POA: Diagnosis not present

## 2022-04-29 DIAGNOSIS — C7970 Secondary malignant neoplasm of unspecified adrenal gland: Secondary | ICD-10-CM | POA: Diagnosis not present

## 2022-04-29 DIAGNOSIS — J440 Chronic obstructive pulmonary disease with acute lower respiratory infection: Secondary | ICD-10-CM | POA: Diagnosis not present

## 2022-04-29 DIAGNOSIS — J181 Lobar pneumonia, unspecified organism: Secondary | ICD-10-CM | POA: Diagnosis not present

## 2022-04-29 DIAGNOSIS — C7931 Secondary malignant neoplasm of brain: Secondary | ICD-10-CM | POA: Diagnosis not present

## 2022-04-30 ENCOUNTER — Inpatient Hospital Stay (HOSPITAL_BASED_OUTPATIENT_CLINIC_OR_DEPARTMENT_OTHER): Payer: Medicare HMO | Admitting: Hospice and Palliative Medicine

## 2022-04-30 DIAGNOSIS — E440 Moderate protein-calorie malnutrition: Secondary | ICD-10-CM | POA: Diagnosis not present

## 2022-04-30 DIAGNOSIS — C3490 Malignant neoplasm of unspecified part of unspecified bronchus or lung: Secondary | ICD-10-CM

## 2022-04-30 DIAGNOSIS — J181 Lobar pneumonia, unspecified organism: Secondary | ICD-10-CM | POA: Diagnosis not present

## 2022-04-30 DIAGNOSIS — C786 Secondary malignant neoplasm of retroperitoneum and peritoneum: Secondary | ICD-10-CM | POA: Diagnosis not present

## 2022-04-30 DIAGNOSIS — J440 Chronic obstructive pulmonary disease with acute lower respiratory infection: Secondary | ICD-10-CM | POA: Diagnosis not present

## 2022-04-30 DIAGNOSIS — C7931 Secondary malignant neoplasm of brain: Secondary | ICD-10-CM | POA: Diagnosis not present

## 2022-04-30 DIAGNOSIS — C3491 Malignant neoplasm of unspecified part of right bronchus or lung: Secondary | ICD-10-CM | POA: Diagnosis not present

## 2022-04-30 DIAGNOSIS — C7989 Secondary malignant neoplasm of other specified sites: Secondary | ICD-10-CM | POA: Diagnosis not present

## 2022-04-30 DIAGNOSIS — H9192 Unspecified hearing loss, left ear: Secondary | ICD-10-CM | POA: Diagnosis not present

## 2022-04-30 DIAGNOSIS — C7970 Secondary malignant neoplasm of unspecified adrenal gland: Secondary | ICD-10-CM | POA: Diagnosis not present

## 2022-04-30 NOTE — Progress Notes (Signed)
Voicemail left for patient's wife.  Patient is being followed by outpatient palliative care.

## 2022-05-03 DIAGNOSIS — C7970 Secondary malignant neoplasm of unspecified adrenal gland: Secondary | ICD-10-CM | POA: Diagnosis not present

## 2022-05-03 DIAGNOSIS — J181 Lobar pneumonia, unspecified organism: Secondary | ICD-10-CM | POA: Diagnosis not present

## 2022-05-03 DIAGNOSIS — H9192 Unspecified hearing loss, left ear: Secondary | ICD-10-CM | POA: Diagnosis not present

## 2022-05-03 DIAGNOSIS — C7931 Secondary malignant neoplasm of brain: Secondary | ICD-10-CM | POA: Diagnosis not present

## 2022-05-03 DIAGNOSIS — E440 Moderate protein-calorie malnutrition: Secondary | ICD-10-CM | POA: Diagnosis not present

## 2022-05-03 DIAGNOSIS — C3491 Malignant neoplasm of unspecified part of right bronchus or lung: Secondary | ICD-10-CM | POA: Diagnosis not present

## 2022-05-03 DIAGNOSIS — C786 Secondary malignant neoplasm of retroperitoneum and peritoneum: Secondary | ICD-10-CM | POA: Diagnosis not present

## 2022-05-03 DIAGNOSIS — J440 Chronic obstructive pulmonary disease with acute lower respiratory infection: Secondary | ICD-10-CM | POA: Diagnosis not present

## 2022-05-03 DIAGNOSIS — C7989 Secondary malignant neoplasm of other specified sites: Secondary | ICD-10-CM | POA: Diagnosis not present

## 2022-05-06 ENCOUNTER — Telehealth: Payer: Self-pay | Admitting: *Deleted

## 2022-05-06 ENCOUNTER — Telehealth: Payer: Self-pay

## 2022-05-06 ENCOUNTER — Other Ambulatory Visit: Payer: Medicare HMO

## 2022-05-06 DIAGNOSIS — C786 Secondary malignant neoplasm of retroperitoneum and peritoneum: Secondary | ICD-10-CM | POA: Diagnosis not present

## 2022-05-06 DIAGNOSIS — Z515 Encounter for palliative care: Secondary | ICD-10-CM

## 2022-05-06 DIAGNOSIS — E440 Moderate protein-calorie malnutrition: Secondary | ICD-10-CM | POA: Diagnosis not present

## 2022-05-06 DIAGNOSIS — J181 Lobar pneumonia, unspecified organism: Secondary | ICD-10-CM | POA: Diagnosis not present

## 2022-05-06 DIAGNOSIS — C7931 Secondary malignant neoplasm of brain: Secondary | ICD-10-CM | POA: Diagnosis not present

## 2022-05-06 DIAGNOSIS — C3491 Malignant neoplasm of unspecified part of right bronchus or lung: Secondary | ICD-10-CM | POA: Diagnosis not present

## 2022-05-06 DIAGNOSIS — C7970 Secondary malignant neoplasm of unspecified adrenal gland: Secondary | ICD-10-CM | POA: Diagnosis not present

## 2022-05-06 DIAGNOSIS — J440 Chronic obstructive pulmonary disease with acute lower respiratory infection: Secondary | ICD-10-CM | POA: Diagnosis not present

## 2022-05-06 DIAGNOSIS — C7989 Secondary malignant neoplasm of other specified sites: Secondary | ICD-10-CM | POA: Diagnosis not present

## 2022-05-06 DIAGNOSIS — H9192 Unspecified hearing loss, left ear: Secondary | ICD-10-CM | POA: Diagnosis not present

## 2022-05-06 NOTE — Progress Notes (Signed)
COMMUNITY PALLIATIVE CARE SW NOTE  PATIENT NAME: Maurice Simpson DOB: 10-01-1955 MRN: [id number]  PRIMARY CARE PROVIDER: Creig Hines, MD  RESPONSIBLE PARTY:  Acct ID - Guarantor Home Phone Work Phone Relationship Acct Type  [id number] SALOME, COZBY [phone]  Self P/F     [address], [place], [place] [address]     PLAN OF CARE and INTERVENTIONS:              Palliative care encounter: PC SW completed f/u home visit with patient and spouse. SW reviewed and discussed patient changes since previous visit.   Patient received lying in hospital bed in living room - as patient is bed bound. Spouse shared that ptient was about to doze off due to just taking medication for pain.  Constipation: spouse shared that patient is no longer constipated and is having regular bowel movement.   Urology: spouse shared that patient has a new patient appt with urology on 4/29, but she will be calling to cancel due to not being able to get patient to the appt. SW discussed the importance of patient getting to appt due to having a catheter. Spouse shared that patient can only travel via non-emergency EMS, and typically they are sitting and waiting for EMS to return t pick patient back up which can be hours and she does not want to put patient through that. Spouse shared that RN from [facility] was visiting today to look at catheter as patient has expressed some pain in the catheter entrance ara. Spouse has all catheter supplies delivered to the home SW encouraged spouse to inquire with urology if they can do a virtual visit.    Mobility: patient is bed bound. Per spouse [facility] OT ordered a hoyer lift through [employer] for patient and is waiting for approval and delivery. Patient is MAX -A with all care needs except eating. Spouse inquired about a long bed wedge to assist with bed re-postioning to get patient off of his back some to prevent bed sores.  Resources: SW provided spouse with another medicaid  application as she misplaced the previous one. SW also provided spouse with [employer] small cell lung cancer grant contact info. Spouse shared that she applied for food stamps last week and is waiting on a determination.   Plan: PC to f/u in 1 week.   SOCIAL HX:  Social History   Tobacco Use   Smoking status: Former    Packs/day: 2.00    Years: 51.00    Additional pack years: 0.00    Total pack years: 102.00    Types: Cigarettes    Quit date: 11/07/2021    Years since quitting: 0.4   Smokeless tobacco: Never  Substance Use Topics   Alcohol use: Not Currently    CODE STATUS: FULL CODE ADVANCED DIRECTIVES:N MOST FORM COMPLETE:  N HOSPICE EDUCATION PROVIDED: Y  PPS:30%  Time spent: 40 min    [place], [place]

## 2022-05-06 NOTE — Telephone Encounter (Signed)
PC SW LVM for Dr. Smith Robert office, requesting a bed wedge be ordered for patient per spouse request, to assist with bed repositioning in hopes to prevent bed sores.

## 2022-05-06 NOTE — Telephone Encounter (Signed)
Asia with [facility] asking if we can order this patient a bed wedge to assist wife in positioning patient.

## 2022-05-06 NOTE — Telephone Encounter (Signed)
Call from [facility] nurse Durwin Nora to report that wife said that they are not going to make the Urology appointment today, And that he would need to be admitted for any planned procedure related to that appointment. I advised the they need to discuss this with Urology office and that I would let Dr Smith Robert know

## 2022-05-07 ENCOUNTER — Encounter: Payer: Self-pay | Admitting: Oncology

## 2022-05-07 NOTE — Telephone Encounter (Signed)
yes

## 2022-05-10 ENCOUNTER — Ambulatory Visit: Payer: Medicare HMO | Admitting: Urology

## 2022-05-11 ENCOUNTER — Telehealth: Payer: Self-pay

## 2022-05-11 DIAGNOSIS — C7931 Secondary malignant neoplasm of brain: Secondary | ICD-10-CM | POA: Diagnosis not present

## 2022-05-11 DIAGNOSIS — J440 Chronic obstructive pulmonary disease with acute lower respiratory infection: Secondary | ICD-10-CM | POA: Diagnosis not present

## 2022-05-11 DIAGNOSIS — J181 Lobar pneumonia, unspecified organism: Secondary | ICD-10-CM | POA: Diagnosis not present

## 2022-05-11 DIAGNOSIS — C3491 Malignant neoplasm of unspecified part of right bronchus or lung: Secondary | ICD-10-CM | POA: Diagnosis not present

## 2022-05-11 DIAGNOSIS — H9192 Unspecified hearing loss, left ear: Secondary | ICD-10-CM | POA: Diagnosis not present

## 2022-05-11 DIAGNOSIS — C7970 Secondary malignant neoplasm of unspecified adrenal gland: Secondary | ICD-10-CM | POA: Diagnosis not present

## 2022-05-11 DIAGNOSIS — E440 Moderate protein-calorie malnutrition: Secondary | ICD-10-CM | POA: Diagnosis not present

## 2022-05-11 DIAGNOSIS — C786 Secondary malignant neoplasm of retroperitoneum and peritoneum: Secondary | ICD-10-CM | POA: Diagnosis not present

## 2022-05-11 DIAGNOSIS — C7989 Secondary malignant neoplasm of other specified sites: Secondary | ICD-10-CM | POA: Diagnosis not present

## 2022-05-11 NOTE — Telephone Encounter (Signed)
PC SW sent message to patients spouse, Lucendia Herrlich, making her aware that patients insurance does not cover a bed wedge, and that it is advised that patient purchases one from online.

## 2022-05-13 DIAGNOSIS — C7989 Secondary malignant neoplasm of other specified sites: Secondary | ICD-10-CM | POA: Diagnosis not present

## 2022-05-13 DIAGNOSIS — C786 Secondary malignant neoplasm of retroperitoneum and peritoneum: Secondary | ICD-10-CM | POA: Diagnosis not present

## 2022-05-13 DIAGNOSIS — J181 Lobar pneumonia, unspecified organism: Secondary | ICD-10-CM | POA: Diagnosis not present

## 2022-05-13 DIAGNOSIS — C7931 Secondary malignant neoplasm of brain: Secondary | ICD-10-CM | POA: Diagnosis not present

## 2022-05-13 DIAGNOSIS — C3491 Malignant neoplasm of unspecified part of right bronchus or lung: Secondary | ICD-10-CM | POA: Diagnosis not present

## 2022-05-13 DIAGNOSIS — J440 Chronic obstructive pulmonary disease with acute lower respiratory infection: Secondary | ICD-10-CM | POA: Diagnosis not present

## 2022-05-13 DIAGNOSIS — H9192 Unspecified hearing loss, left ear: Secondary | ICD-10-CM | POA: Diagnosis not present

## 2022-05-13 DIAGNOSIS — E440 Moderate protein-calorie malnutrition: Secondary | ICD-10-CM | POA: Diagnosis not present

## 2022-05-13 DIAGNOSIS — C7970 Secondary malignant neoplasm of unspecified adrenal gland: Secondary | ICD-10-CM | POA: Diagnosis not present

## 2022-05-14 ENCOUNTER — Other Ambulatory Visit: Payer: Medicare HMO

## 2022-05-14 ENCOUNTER — Other Ambulatory Visit: Payer: Self-pay | Admitting: Hospice and Palliative Medicine

## 2022-05-14 VITALS — BP 98/62 | HR 91 | Temp 97.4°F

## 2022-05-14 DIAGNOSIS — C3490 Malignant neoplasm of unspecified part of unspecified bronchus or lung: Secondary | ICD-10-CM

## 2022-05-14 DIAGNOSIS — Z515 Encounter for palliative care: Secondary | ICD-10-CM

## 2022-05-14 NOTE — Progress Notes (Signed)
Received message from [employer] with palliative care that wife is requesting hospice. Order sent.

## 2022-05-14 NOTE — Progress Notes (Unsigned)
PATIENT NAME: Maurice Simpson DOB: [birth year] MRN: [id number]  PRIMARY CARE PROVIDER: Creig Hines, MD  RESPONSIBLE PARTY:  Acct ID - Guarantor Home Phone Work Phone Relationship Acct Type  [id number] DARIN, CAPPO [phone]  Self P/F     [address], [place], [place] [address]   Home visit completed with patient and wife.  ACP:  Remains a full code at this time.  Wife states she is not ready for change in code status.   Foley Cath:  In place and secured to right thigh.  Draining dark, yellow urine with sediment present.  Cath is due to be replaced next week.   Hospice vs Palliative Care:  Wife is open to hospice now as home health is ending and she is needing more support to manage patient at home.  Patient is completely bed bound and requires 2 person assist to reposition in the bed.  Po intake is down to liquids only.  Secure message sent to Elouise Munroe, NP regarding hospice referral.  Wife would like admission today if possible.  I have sent [facility] a message regarding pending referral and wife's request for admission.   Home Health:  Currently under [facility] but scheduled for discharge next week.     CODE STATUS: Full ADVANCED DIRECTIVES: No MOST FORM: No PPS: 30%   PHYSICAL EXAM:   VITALS: Today's Vitals   05/14/22 0917  BP: 98/62  Pulse: 91  Temp: (!) 97.4 F (36.3 C)  SpO2: 93%    LUNGS: clear to auscultation  CARDIAC: Cor RRR}  EXTREMITIES: - for edema SKIN: Skin color, texture, turgor normal. No rashes or lesions or poor skin turgor   NEURO: positive for gait problems and weakness       Truitt Merle, RN

## 2022-05-17 ENCOUNTER — Telehealth: Payer: Self-pay | Admitting: *Deleted

## 2022-05-17 MED ORDER — MORPHINE SULFATE (CONCENTRATE) 10 MG /0.5 ML PO SOLN: 5.0000 mg | ORAL | 0 refills | Status: DC | PRN

## 2022-05-17 MED ORDER — LORAZEPAM 0.5 MG PO TABS: 0.5000 mg | ORAL_TABLET | Freq: Three times a day (TID) | ORAL | 0 refills | Status: DC

## 2022-05-17 NOTE — Telephone Encounter (Signed)
Call from [facility] with hospice stating patient is declining and has a PPS of 20%, weak nausea. She is asking for orders for Liquid MS (Roxanol)l and Ativan tabs that they will crush and add to the liquid MS.

## 2022-05-17 NOTE — Telephone Encounter (Signed)
I spoke with patient's nurse.  Patient declining and active conversations with wife regarding goals.  Reportedly, wife is in agreement with initiating comfort medications.  Will send Rx for morphine and lorazepam to pharmacy.

## 2022-05-22 NOTE — Progress Notes (Addendum)
05/22/22:  Call from [facility] stating wife is calling asking for contact for hospital bed to be picked up. Contacted Jasmine with [employer] and requested someone from [employer] call wife today to arrange pick up.  Alfonso Ramus, [place] [phone]

## 2022-06-12 DEATH — deceased

## 2024-01-06 IMAGING — CT CT CHEST LUNG CANCER SCREENING LOW DOSE W/O CM
2 of 5 series · 15 of 40 positions shown, 18 images · non-contrast
Comparison: 08/27/2011 CTA of the heart. 02/18/2013 chest CT from
[HOSPITAL] [REDACTED], report only.

CLINICAL DATA: One hundred pack-year smoking history/current
smoker. * Tracking Code: BO *



[Series 3: lung 1.00 · axial · 0.81mm/px · z∈[-1292,-943]mm · 12 of 385 slices shown, 15 images]
[im 18/385  mediastinal]
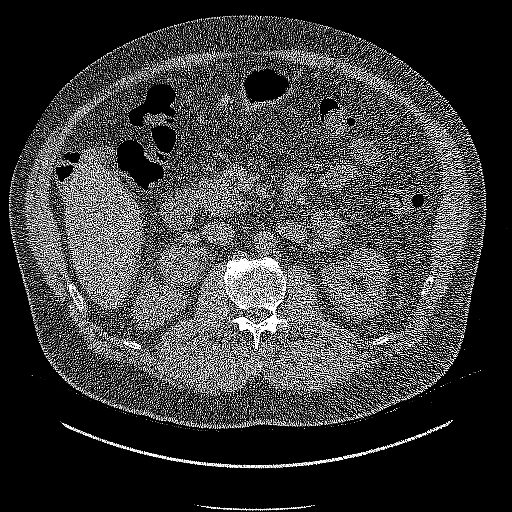
[im 18/385  lung]
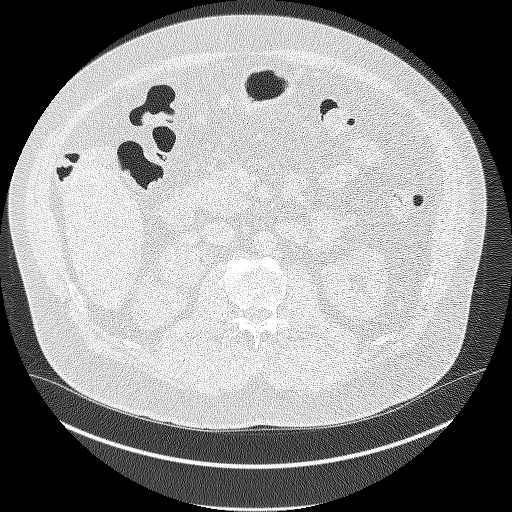
[im 53/385  lung]
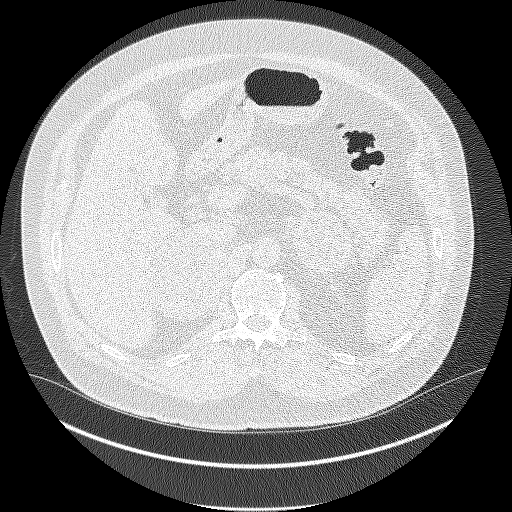
[im 88/385  lung]
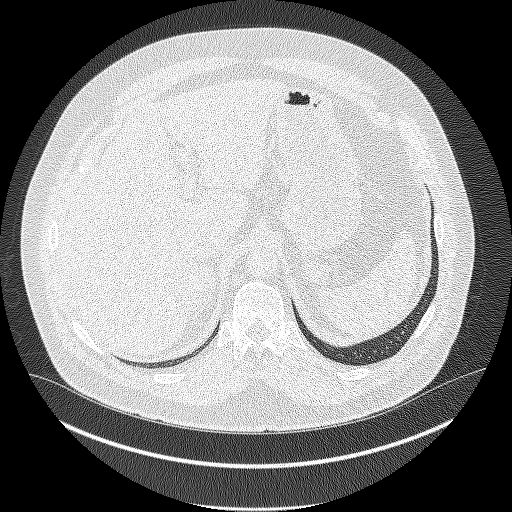
[im 123/385  lung]
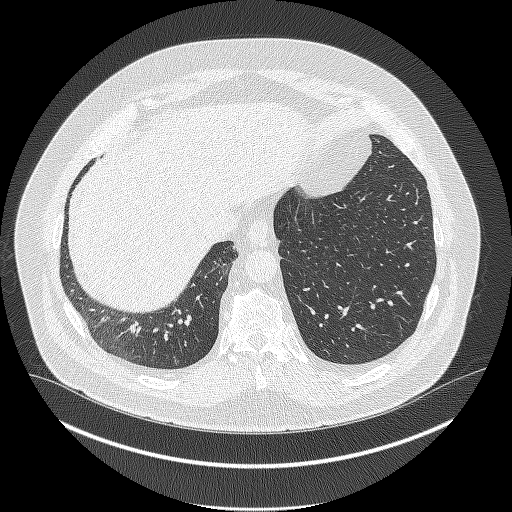
[im 140/385  mediastinal]
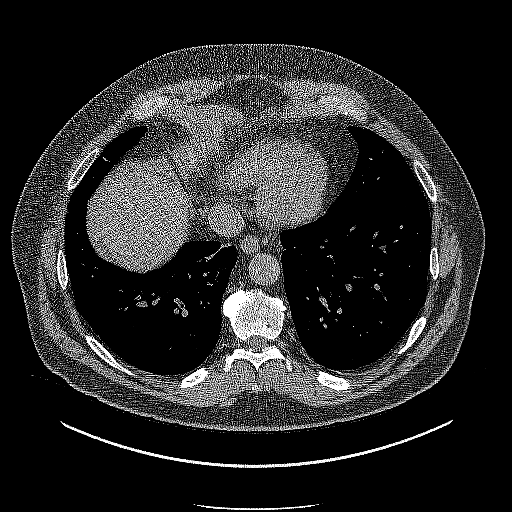
[im 140/385  lung]
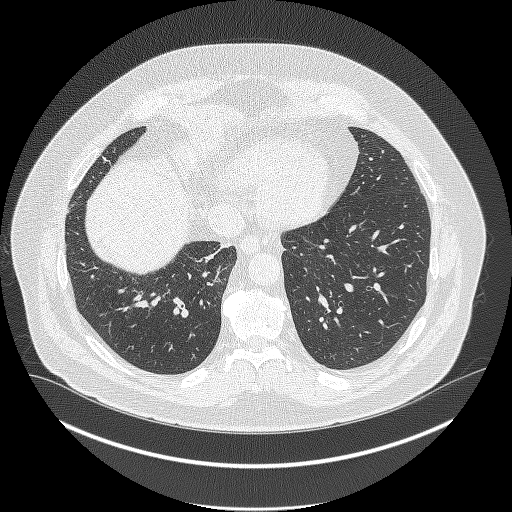
[im 175/385  lung]
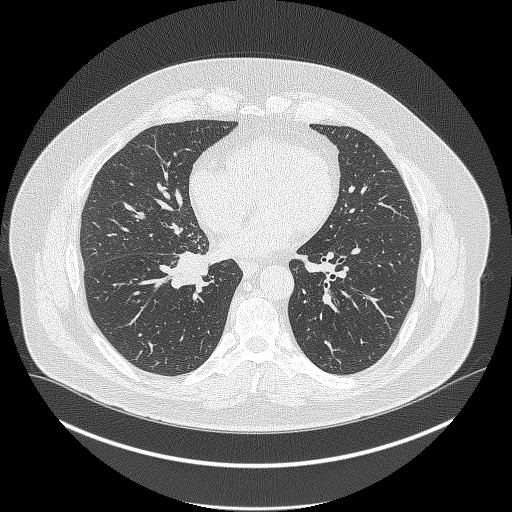
[im 210/385  lung]
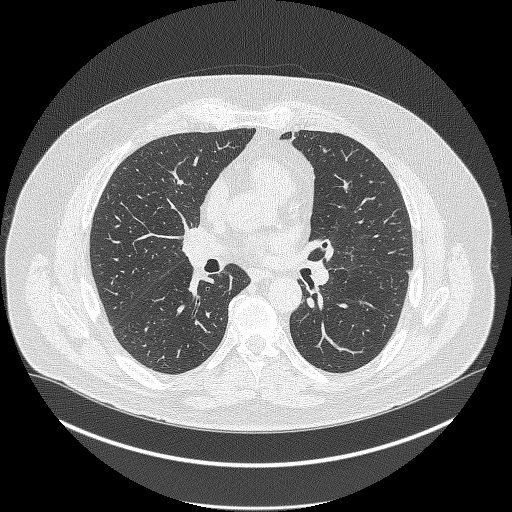
[im 245/385  lung]
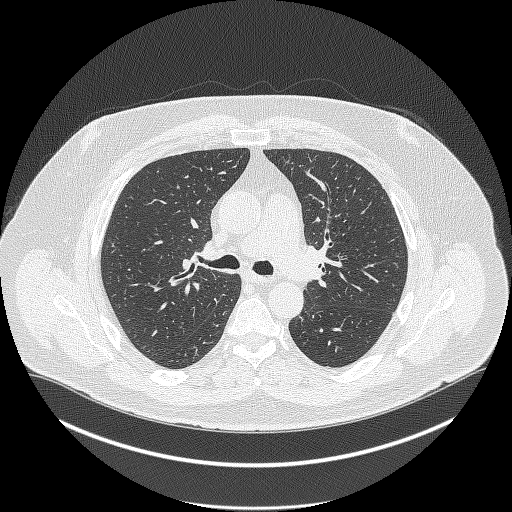
[im 262/385  mediastinal]
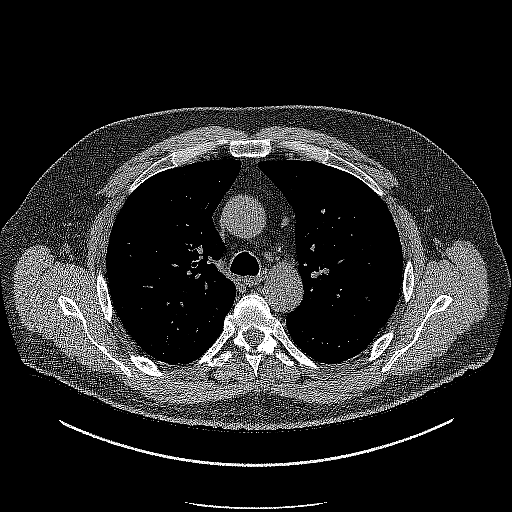
[im 262/385  lung]
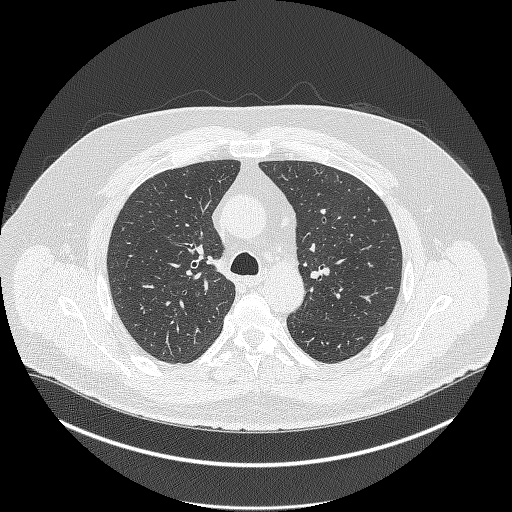
[im 297/385  lung]
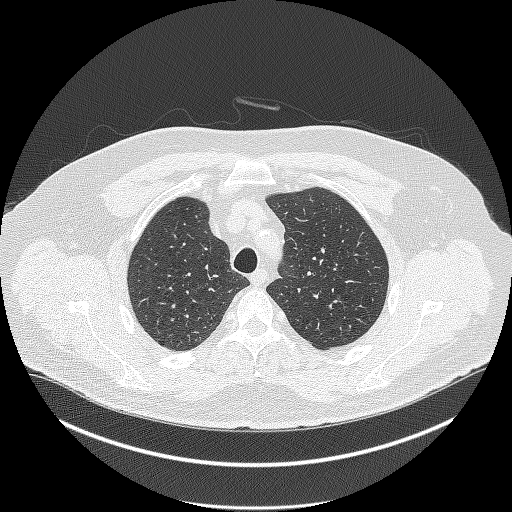
[im 332/385  lung]
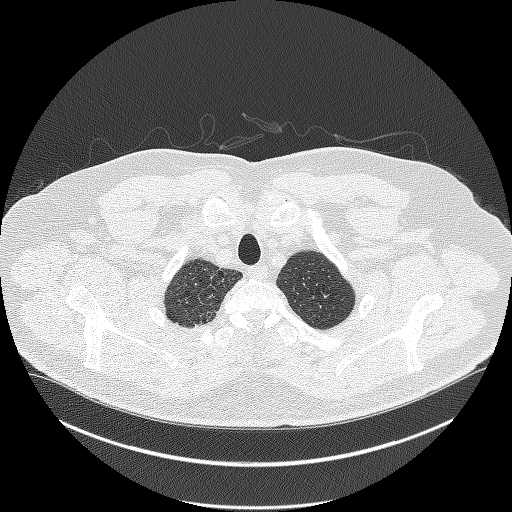
[im 367/385  lung]
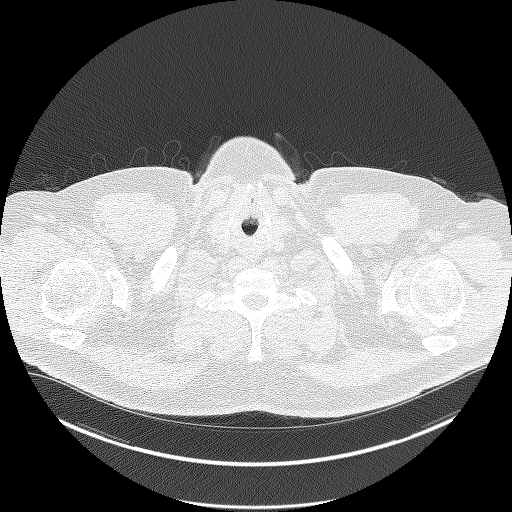

[Series 5: coronals lung 1.00 cor · coronal · 0.75mm/px · 3 of 360 slices shown]
[im 72/360  lung]
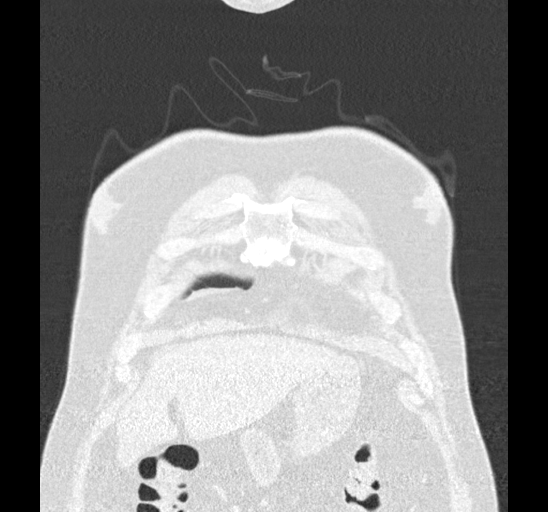
[im 144/360  lung]
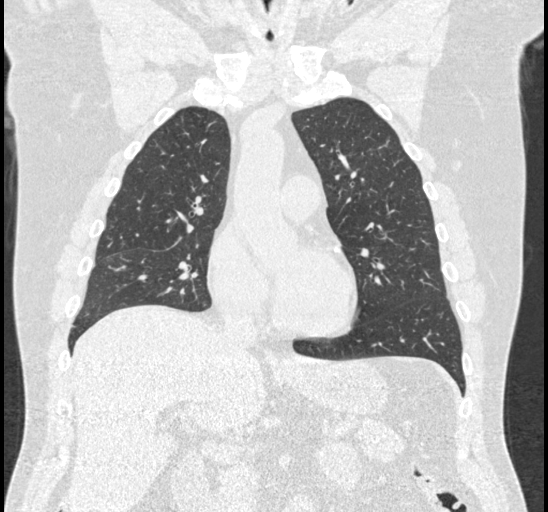
[im 216/360  lung]
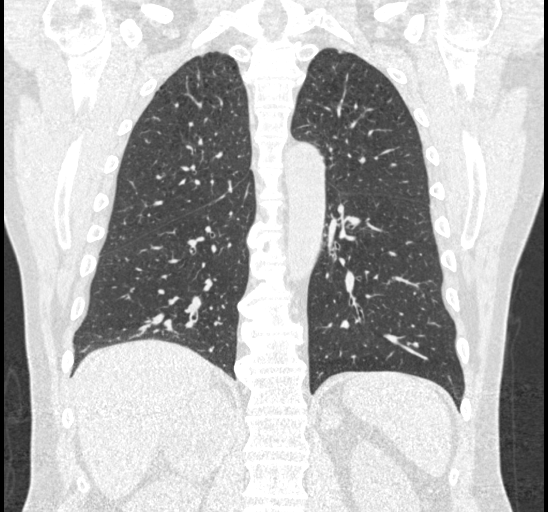

[15 of 40 positions shown; findings below may reference images not displayed]

FINDINGS: Cardiovascular: Aortic atherosclerosis. Normal heart size, without
pericardial effusion. Lad and left main coronary artery
calcification.

Mediastinum/Nodes: No supraclavicular adenopathy. No mediastinal
adenopathy.

Lungs/Pleura: No pleural fluid. Mild to moderate centrilobular
emphysema. Right middle lobe partial collapse with
peribronchovascular mild reticulonodular opacities.

Underlying pattern of diffuse pulmonary micronodularity is likely
indicative of smoking related respiratory bronchiolitis.

Left upper lobe pulmonary nodules measure maximally volume derived
equivalent diameter 5.5 mm.

Centered at the takeoff of the right lower lobe bronchus is a soft
tissue mass on the order of volume derived equivalent diameter
mm, including on 196/3 and coronal image 188.

Upper Abdomen: Normal noncontrast appearance of the liver. Stone in
the gallbladder neck measures 6 mm. Normal imaged portions of the
spleen, stomach, pancreas, kidneys.

Right adrenal 6.7 x 5.5 cm mass on 67/2.

Left adrenal 5.1 x 4.3 cm mass.  Abdominal aortic atherosclerosis.

Musculoskeletal: Mild bilateral gynecomastia. No acute osseous
abnormality.
IMPRESSION: 1. Lung-RADS 4X, highly suspicious. Additional imaging evaluation or
consultation with Pulmonology or Thoracic Surgery recommended. Mass
centered at the origin of the right lower lobe bronchus, causing
significant compression of the right lower lobe bronchus and mild
mass-effect upon the right lower lobe bronchus. Findings are most
consistent with primary bronchogenic carcinoma.
2. Bilateral adrenal masses, highly suspicious for metastatic
disease.
3. Mild likely postobstructive pneumonitis and volume loss in the
right middle lobe.
4. Aortic atherosclerosis (H5UX3-UL9.9), coronary artery
atherosclerosis and emphysema (H5UX3-QWD.P).
5. Cholelithiasis
6. Bilateral gynecomastia.

These results will be called to the ordering clinician or
representative by the Radiologist Assistant, and communication
documented in the PACS or [REDACTED].
